# Patient Record
Sex: Male | Born: 1935
Health system: Southern US, Community
[De-identification: ages and names within clinical notes are randomized; demographics above are authoritative.]

## PROBLEM LIST (undated history)

## (undated) DIAGNOSIS — E559 Vitamin D deficiency, unspecified: Secondary | ICD-10-CM

## (undated) DIAGNOSIS — M1990 Unspecified osteoarthritis, unspecified site: Secondary | ICD-10-CM

## (undated) DIAGNOSIS — R55 Syncope and collapse: Secondary | ICD-10-CM

## (undated) DIAGNOSIS — S32591A Other specified fracture of right pubis, initial encounter for closed fracture: Secondary | ICD-10-CM

## (undated) DIAGNOSIS — R3915 Urgency of urination: Secondary | ICD-10-CM

## (undated) DIAGNOSIS — K5730 Diverticulosis of large intestine without perforation or abscess without bleeding: Secondary | ICD-10-CM

## (undated) DIAGNOSIS — G629 Polyneuropathy, unspecified: Secondary | ICD-10-CM

## (undated) DIAGNOSIS — N390 Urinary tract infection, site not specified: Secondary | ICD-10-CM

## (undated) DIAGNOSIS — J189 Pneumonia, unspecified organism: Secondary | ICD-10-CM

## (undated) DIAGNOSIS — M21372 Foot drop, left foot: Secondary | ICD-10-CM

## (undated) DIAGNOSIS — N400 Enlarged prostate without lower urinary tract symptoms: Secondary | ICD-10-CM

## (undated) DIAGNOSIS — R7989 Other specified abnormal findings of blood chemistry: Secondary | ICD-10-CM

## (undated) DIAGNOSIS — H3530 Unspecified macular degeneration: Secondary | ICD-10-CM

## (undated) DIAGNOSIS — T4145XA Adverse effect of unspecified anesthetic, initial encounter: Secondary | ICD-10-CM

## (undated) DIAGNOSIS — G252 Other specified forms of tremor: Secondary | ICD-10-CM

## (undated) DIAGNOSIS — Z79899 Other long term (current) drug therapy: Secondary | ICD-10-CM

## (undated) DIAGNOSIS — Z960 Presence of urogenital implants: Secondary | ICD-10-CM

## (undated) DIAGNOSIS — I10 Essential (primary) hypertension: Secondary | ICD-10-CM

## (undated) DIAGNOSIS — K269 Duodenal ulcer, unspecified as acute or chronic, without hemorrhage or perforation: Secondary | ICD-10-CM

## (undated) DIAGNOSIS — G459 Transient cerebral ischemic attack, unspecified: Secondary | ICD-10-CM

## (undated) DIAGNOSIS — R350 Frequency of micturition: Secondary | ICD-10-CM

## (undated) DIAGNOSIS — K4090 Unilateral inguinal hernia, without obstruction or gangrene, not specified as recurrent: Secondary | ICD-10-CM

## (undated) DIAGNOSIS — D126 Benign neoplasm of colon, unspecified: Secondary | ICD-10-CM

## (undated) DIAGNOSIS — M109 Gout, unspecified: Secondary | ICD-10-CM

## (undated) DIAGNOSIS — T8859XA Other complications of anesthesia, initial encounter: Secondary | ICD-10-CM

## (undated) DIAGNOSIS — N184 Chronic kidney disease, stage 4 (severe): Secondary | ICD-10-CM

## (undated) DIAGNOSIS — M21371 Foot drop, right foot: Secondary | ICD-10-CM

## (undated) DIAGNOSIS — R269 Unspecified abnormalities of gait and mobility: Secondary | ICD-10-CM

## (undated) DIAGNOSIS — G250 Essential tremor: Secondary | ICD-10-CM

## (undated) DIAGNOSIS — M48062 Spinal stenosis, lumbar region with neurogenic claudication: Secondary | ICD-10-CM

## (undated) DIAGNOSIS — E785 Hyperlipidemia, unspecified: Secondary | ICD-10-CM

## (undated) DIAGNOSIS — I739 Peripheral vascular disease, unspecified: Secondary | ICD-10-CM

## (undated) DIAGNOSIS — H02409 Unspecified ptosis of unspecified eyelid: Secondary | ICD-10-CM

## (undated) DIAGNOSIS — K648 Other hemorrhoids: Secondary | ICD-10-CM

## (undated) HISTORY — DX: Benign prostatic hyperplasia without lower urinary tract symptoms: N40.0

## (undated) HISTORY — DX: Unspecified abnormalities of gait and mobility: R26.9

## (undated) HISTORY — DX: Unilateral inguinal hernia, without obstruction or gangrene, not specified as recurrent: K40.90

## (undated) HISTORY — DX: Other long term (current) drug therapy: Z79.899

## (undated) HISTORY — DX: Unspecified osteoarthritis, unspecified site: M19.90

## (undated) HISTORY — PX: OTHER SURGICAL HISTORY: SHX169

## (undated) HISTORY — DX: Gout, unspecified: M10.9

## (undated) HISTORY — DX: Duodenal ulcer, unspecified as acute or chronic, without hemorrhage or perforation: K26.9

## (undated) HISTORY — DX: Essential tremor: G25.0

## (undated) HISTORY — DX: Benign neoplasm of colon, unspecified: D12.6

## (undated) HISTORY — DX: Chronic kidney disease, stage 4 (severe): N18.4

## (undated) HISTORY — DX: Diverticulosis of large intestine without perforation or abscess without bleeding: K57.30

## (undated) HISTORY — DX: Vitamin D deficiency, unspecified: E55.9

## (undated) HISTORY — DX: Syncope and collapse: R55

## (undated) HISTORY — DX: Spinal stenosis, lumbar region with neurogenic claudication: M48.062

## (undated) HISTORY — DX: Other specified abnormal findings of blood chemistry: R79.89

## (undated) HISTORY — PX: FINGER SURGERY: SHX640

## (undated) HISTORY — PX: NERVE BIOPSY: SHX1015

## (undated) HISTORY — DX: Peripheral vascular disease, unspecified: I73.9

## (undated) HISTORY — DX: Essential tremor: G25.2

## (undated) HISTORY — DX: Other hemorrhoids: K64.8

## (undated) HISTORY — DX: Unspecified ptosis of unspecified eyelid: H02.409

## (undated) HISTORY — DX: Other specified fracture of right pubis, initial encounter for closed fracture: S32.591A

## (undated) HISTORY — PX: HERNIA REPAIR: SHX51

---

## 1998-02-10 ENCOUNTER — Ambulatory Visit (HOSPITAL_COMMUNITY): Admission: RE | Admit: 1998-02-10 | Discharge: 1998-02-10 | Payer: Self-pay | Admitting: Oncology

## 2003-09-29 ENCOUNTER — Ambulatory Visit (HOSPITAL_COMMUNITY): Admission: RE | Admit: 2003-09-29 | Discharge: 2003-09-29 | Payer: Self-pay | Admitting: Internal Medicine

## 2005-07-30 ENCOUNTER — Encounter: Admission: RE | Admit: 2005-07-30 | Discharge: 2005-07-30 | Payer: Self-pay | Admitting: Neurology

## 2005-08-13 ENCOUNTER — Encounter: Admission: RE | Admit: 2005-08-13 | Discharge: 2005-08-13 | Payer: Self-pay | Admitting: Neurology

## 2006-07-08 ENCOUNTER — Ambulatory Visit (HOSPITAL_COMMUNITY): Admission: EM | Admit: 2006-07-08 | Discharge: 2006-07-08 | Payer: Self-pay | Admitting: Emergency Medicine

## 2007-10-23 ENCOUNTER — Encounter: Admission: RE | Admit: 2007-10-23 | Discharge: 2007-10-23 | Payer: Self-pay | Admitting: Neurology

## 2008-10-07 ENCOUNTER — Encounter: Admission: RE | Admit: 2008-10-07 | Discharge: 2008-10-07 | Payer: Self-pay | Admitting: Internal Medicine

## 2009-07-24 ENCOUNTER — Emergency Department (HOSPITAL_BASED_OUTPATIENT_CLINIC_OR_DEPARTMENT_OTHER): Admission: EM | Admit: 2009-07-24 | Discharge: 2009-07-24 | Payer: Self-pay | Admitting: Emergency Medicine

## 2009-07-24 ENCOUNTER — Ambulatory Visit: Payer: Self-pay | Admitting: Interventional Radiology

## 2009-10-01 HISTORY — PX: OTHER SURGICAL HISTORY: SHX169

## 2010-07-10 ENCOUNTER — Ambulatory Visit (HOSPITAL_COMMUNITY): Admission: AD | Admit: 2010-07-10 | Discharge: 2010-07-12 | Payer: Self-pay | Admitting: Orthopedic Surgery

## 2010-07-27 ENCOUNTER — Ambulatory Visit (HOSPITAL_COMMUNITY)
Admission: RE | Admit: 2010-07-27 | Discharge: 2010-07-27 | Payer: Self-pay | Source: Home / Self Care | Admitting: Gastroenterology

## 2010-10-21 ENCOUNTER — Encounter: Payer: Self-pay | Admitting: Neurology

## 2010-12-14 LAB — BASIC METABOLIC PANEL
BUN: 25 mg/dL — ABNORMAL HIGH (ref 6–23)
CO2: 26 mEq/L (ref 19–32)
Calcium: 8.7 mg/dL (ref 8.4–10.5)
Chloride: 107 mEq/L (ref 96–112)
Creatinine, Ser: 1.61 mg/dL — ABNORMAL HIGH (ref 0.4–1.5)
GFR calc Af Amer: 50 mL/min — ABNORMAL LOW (ref >60)
GFR calc non Af Amer: 42 mL/min — ABNORMAL LOW (ref >60)
Glucose, Bld: 117 mg/dL — ABNORMAL HIGH (ref 70–99)
Potassium: 4.8 mEq/L (ref 3.5–5.1)
Sodium: 140 mEq/L (ref 135–145)

## 2010-12-14 LAB — DIFFERENTIAL
Basophils Absolute: 0 10*3/uL (ref 0.0–0.1)
Basophils Relative: 0 % (ref 0–1)
Eosinophils Absolute: 0.1 10*3/uL (ref 0.0–0.7)
Eosinophils Relative: 1 % (ref 0–5)
Lymphocytes Relative: 18 % (ref 12–46)
Lymphs Abs: 1.9 10*3/uL (ref 0.7–4.0)
Monocytes Absolute: 0.9 10*3/uL (ref 0.1–1.0)
Monocytes Relative: 8 % (ref 3–12)
Neutro Abs: 7.5 10*3/uL (ref 1.7–7.7)
Neutrophils Relative %: 72 % (ref 43–77)

## 2010-12-14 LAB — COMPREHENSIVE METABOLIC PANEL
ALT: 18 U/L (ref 0–53)
AST: 23 U/L (ref 0–37)
Albumin: 3.4 g/dL — ABNORMAL LOW (ref 3.5–5.2)
Alkaline Phosphatase: 55 U/L (ref 39–117)
BUN: 26 mg/dL — ABNORMAL HIGH (ref 6–23)
CO2: 26 mEq/L (ref 19–32)
Calcium: 9.4 mg/dL (ref 8.4–10.5)
Chloride: 104 mEq/L (ref 96–112)
Creatinine, Ser: 1.79 mg/dL — ABNORMAL HIGH (ref 0.4–1.5)
GFR calc Af Amer: 45 mL/min — ABNORMAL LOW (ref >60)
GFR calc non Af Amer: 37 mL/min — ABNORMAL LOW (ref >60)
Glucose, Bld: 102 mg/dL — ABNORMAL HIGH (ref 70–99)
Potassium: 4.5 mEq/L (ref 3.5–5.1)
Sodium: 138 mEq/L (ref 135–145)
Total Bilirubin: 0.7 mg/dL (ref 0.3–1.2)
Total Protein: 6.7 g/dL (ref 6.0–8.3)

## 2010-12-14 LAB — CBC
HCT: 32.4 % — ABNORMAL LOW (ref 39.0–52.0)
HCT: 37.1 % — ABNORMAL LOW (ref 39.0–52.0)
Hemoglobin: 10.3 g/dL — ABNORMAL LOW (ref 13.0–17.0)
Hemoglobin: 12 g/dL — ABNORMAL LOW (ref 13.0–17.0)
MCH: 32.2 pg (ref 26.0–34.0)
MCH: 32.9 pg (ref 26.0–34.0)
MCHC: 31.8 g/dL (ref 30.0–36.0)
MCHC: 32.3 g/dL (ref 30.0–36.0)
MCV: 101.3 fL — ABNORMAL HIGH (ref 78.0–100.0)
MCV: 101.6 fL — ABNORMAL HIGH (ref 78.0–100.0)
Platelets: 157 10*3/uL (ref 150–400)
Platelets: 161 10*3/uL (ref 150–400)
RBC: 3.2 MIL/uL — ABNORMAL LOW (ref 4.22–5.81)
RBC: 3.65 MIL/uL — ABNORMAL LOW (ref 4.22–5.81)
RDW: 13.7 % (ref 11.5–15.5)
RDW: 14 % (ref 11.5–15.5)
WBC: 10.4 10*3/uL (ref 4.0–10.5)
WBC: 8.6 10*3/uL (ref 4.0–10.5)

## 2010-12-14 LAB — APTT: aPTT: 28 seconds (ref 24–37)

## 2010-12-14 LAB — PROTIME-INR
INR: 1.05 (ref 0.00–1.49)
Prothrombin Time: 13.9 seconds (ref 11.6–15.2)

## 2010-12-14 LAB — SURGICAL PCR SCREEN
MRSA, PCR: NEGATIVE
Staphylococcus aureus: NEGATIVE

## 2011-02-16 NOTE — Op Note (Signed)
[name], [name]                 ACCOUNT NO.:  [id number]   MEDICAL RECORD NO.:  [id number]          PATIENT TYPE:  OBV   LOCATION:  2550                         FACILITY:  [facility]   PHYSICIAN:  Melrose Nakayama, MD  DATE OF BIRTH:  12/04/1928   DATE OF PROCEDURE:  07/08/2006  DATE OF DISCHARGE:                                 OPERATIVE REPORT   PREOPERATIVE DIAGNOSIS:  Left ring finger table-saw injury, distal tip  amputation.   POSTOPERATIVE DIAGNOSIS:  Left ring finger table-saw injury, distal tip  amputation.   PROCEDURES:  1. Left ring finger V-Y advancement flap.  2. Left ring finger debridement of skin, subcutaneous tissue and bone      associated with open fracture.  3. Left ring finger nail bed ablation.   ATTENDING SURGEON:  Melrose Nakayama, MD, who was scrubbed and present for  the entire procedure.   ASSISTANT SURGEON:  None.   ANESTHESIA:  General via endotracheal tube.   SURGICAL INDICATIONS:  Drew Gibson is a 77 year old, right-hand-dominant  gentleman, who was at home working on his table saw, when he sustained a  traumatic injury to his left ring finger.  The patient was seen in the  emergency department and noted to have an exposed distal phalanx, as well as  a transverse amputation through the distal phalanx.  The patient was seen  and evaluated.  He received tetanus and a Betadine dressing in the emergency  department.  A signed informed consent was obtained to proceed with the  above procedure.  The risks, benefits and alternatives were discussed in  detail with the patient and signed informed consent was obtained.   INTRAOPERATIVE FINDINGS:  The patient did have a transverse amputation of  the distal phalanx.  The patient had a small amount of the germinal and  sterile matrix left, with little distal phalanx to support the nail.  He had  very little volar skin loss.  It was mainly dorsally.   DESCRIPTION OF PROCEDURE:  The patient was properly  identified in the  preoperative holding area and a mark with a permanent marker was made on the  left ring finger to indicate the correct operative site.  The patient  received 1 g IV Ancef prior to any skin incisions.  The patient was then  brought back to the operating room and placed supine on the anesthesia  operating table, where general anesthesia was administered via endotracheal  tube.  The patient tolerated this procedure well.  A well-padded tourniquet  was then placed on the left brachium and sealed with a sterile drape.  The  left upper extremity was then prepped with Betadine and then sterilely  draped.  The left upper extremity was then prepped with Betadine and then  sterilely draped.  The limb was then elevated, and using Esmarch  exsanguination, the tourniquet insufflated to 250 mmHg.  Attention was  turned to the left ring finger, where debridement of the skin, subcutaneous  tissue and bone was then carried out.  A portion of the distal phalanx was  removed  with a rongeur, and the bone was shortened.  There was very little  supporting bone of the distal phalanx to support a nail; therefore, a nail  bed ablation was then carried out sharply using a 15 blade.  Two back cuts  were made in the eponychium in order to ablate the nail matrix.  Following  this, the patient still had a soft tissue coverage issue to cover the distal  phalanx.  Therefore, a V-Y advancement flap was then shaped out volarly.  The skin was then incised and the subcutaneous tissue was then bluntly  dissected and the skin was then mobilized distally to make a volar-based  flap, which swung over dorsally.  This obtained good closure over the distal  phalanx.  The excess skin was then trimmed using a 15 blade, and the skin  flaps were then sewn in with a 5-0 chromic suture.  After the soft tissue  coverage was complete, the tourniquet was deflated after being up for 30  minutes.  There was good perfusion of  the distal tip, as well as the skin  flap.  Xeroform was then applied to the finger.  Marcaine 1% 5 cc was then  injected into the finger, into the flexor tendon sheath for a local digital  block, and then the 5 cc remaining was injected volarly around the digital  nerves.  A sterile compressive dressing was then applied.  The patient was  then placed in a well-padded finger splint.  The patient was then extubated  and taken to the recovery room in good condition.   POSTOPERATIVE PLAN:  The patient will be continued on p.o. antibiotics and  pain medicines.  He will come back to the office in approximately 7 days for  a wound check.  At that time, we will hopefully get him down into therapy to  get him a tip-protector splint and allow him to work on motion.  And then we  will see him back and allow for soft tissue healing.      Melrose Nakayama, MD  Electronically Signed     FWO/MEDQ  D:  07/08/2006  T:  07/09/2006  Job:  [id number]

## 2011-08-04 ENCOUNTER — Emergency Department (INDEPENDENT_AMBULATORY_CARE_PROVIDER_SITE_OTHER): Payer: Medicare Other

## 2011-08-04 ENCOUNTER — Emergency Department (HOSPITAL_BASED_OUTPATIENT_CLINIC_OR_DEPARTMENT_OTHER)
Admission: EM | Admit: 2011-08-04 | Discharge: 2011-08-04 | Disposition: A | Discharge status: Other Acute Inpatient Hospital | Payer: Medicare Other | Attending: Emergency Medicine | Admitting: Emergency Medicine

## 2011-08-04 ENCOUNTER — Encounter: Payer: Self-pay | Admitting: *Deleted

## 2011-08-04 ENCOUNTER — Inpatient Hospital Stay (HOSPITAL_COMMUNITY)
Admission: AD | Admit: 2011-08-04 | Discharge: 2011-08-07 | DRG: 194 | Disposition: A | Discharge status: Home / Self Care | Payer: Medicare Other | Source: Other Acute Inpatient Hospital | Attending: Internal Medicine | Admitting: Internal Medicine

## 2011-08-04 DIAGNOSIS — D649 Anemia, unspecified: Secondary | ICD-10-CM | POA: Diagnosis present

## 2011-08-04 DIAGNOSIS — G629 Polyneuropathy, unspecified: Secondary | ICD-10-CM | POA: Diagnosis present

## 2011-08-04 DIAGNOSIS — N179 Acute kidney failure, unspecified: Secondary | ICD-10-CM | POA: Diagnosis present

## 2011-08-04 DIAGNOSIS — R509 Fever, unspecified: Secondary | ICD-10-CM

## 2011-08-04 DIAGNOSIS — I959 Hypotension, unspecified: Secondary | ICD-10-CM | POA: Diagnosis present

## 2011-08-04 DIAGNOSIS — R0902 Hypoxemia: Secondary | ICD-10-CM | POA: Diagnosis present

## 2011-08-04 DIAGNOSIS — G589 Mononeuropathy, unspecified: Secondary | ICD-10-CM | POA: Diagnosis present

## 2011-08-04 DIAGNOSIS — J984 Other disorders of lung: Secondary | ICD-10-CM

## 2011-08-04 DIAGNOSIS — N401 Enlarged prostate with lower urinary tract symptoms: Secondary | ICD-10-CM | POA: Diagnosis present

## 2011-08-04 DIAGNOSIS — E860 Dehydration: Secondary | ICD-10-CM | POA: Diagnosis present

## 2011-08-04 DIAGNOSIS — D539 Nutritional anemia, unspecified: Secondary | ICD-10-CM

## 2011-08-04 DIAGNOSIS — N400 Enlarged prostate without lower urinary tract symptoms: Secondary | ICD-10-CM | POA: Diagnosis present

## 2011-08-04 DIAGNOSIS — R059 Cough, unspecified: Secondary | ICD-10-CM

## 2011-08-04 DIAGNOSIS — I129 Hypertensive chronic kidney disease with stage 1 through stage 4 chronic kidney disease, or unspecified chronic kidney disease: Secondary | ICD-10-CM | POA: Diagnosis present

## 2011-08-04 DIAGNOSIS — R3911 Hesitancy of micturition: Secondary | ICD-10-CM | POA: Diagnosis present

## 2011-08-04 DIAGNOSIS — R05 Cough: Secondary | ICD-10-CM

## 2011-08-04 DIAGNOSIS — N183 Chronic kidney disease, stage 3 unspecified: Secondary | ICD-10-CM | POA: Diagnosis present

## 2011-08-04 DIAGNOSIS — A419 Sepsis, unspecified organism: Secondary | ICD-10-CM

## 2011-08-04 DIAGNOSIS — R0602 Shortness of breath: Secondary | ICD-10-CM | POA: Insufficient documentation

## 2011-08-04 DIAGNOSIS — M109 Gout, unspecified: Secondary | ICD-10-CM | POA: Diagnosis present

## 2011-08-04 DIAGNOSIS — N138 Other obstructive and reflux uropathy: Secondary | ICD-10-CM | POA: Diagnosis present

## 2011-08-04 DIAGNOSIS — J189 Pneumonia, unspecified organism: Secondary | ICD-10-CM

## 2011-08-04 HISTORY — DX: Polyneuropathy, unspecified: G62.9

## 2011-08-04 HISTORY — DX: Essential (primary) hypertension: I10

## 2011-08-04 HISTORY — DX: Benign prostatic hyperplasia without lower urinary tract symptoms: N40.0

## 2011-08-04 LAB — URINALYSIS, ROUTINE W REFLEX MICROSCOPIC
Bilirubin Urine: NEGATIVE
Glucose, UA: NEGATIVE mg/dL
Ketones, ur: NEGATIVE mg/dL
Protein, ur: NEGATIVE mg/dL
Urobilinogen, UA: 0.2 mg/dL (ref 0.0–1.0)

## 2011-08-04 LAB — BASIC METABOLIC PANEL
BUN: 48 mg/dL — ABNORMAL HIGH (ref 6–23)
Chloride: 100 mEq/L (ref 96–112)
Creatinine, Ser: 1.9 mg/dL — ABNORMAL HIGH (ref 0.50–1.35)
GFR calc Af Amer: 37 mL/min — ABNORMAL LOW (ref >90)
GFR calc non Af Amer: 32 mL/min — ABNORMAL LOW (ref >90)
Glucose, Bld: 113 mg/dL — ABNORMAL HIGH (ref 70–99)

## 2011-08-04 LAB — DIFFERENTIAL
Basophils Relative: 0 % (ref 0–1)
Eosinophils Absolute: 0.1 10*3/uL (ref 0.0–0.7)
Lymphs Abs: 0.7 10*3/uL (ref 0.7–4.0)
Monocytes Absolute: 0.3 10*3/uL (ref 0.1–1.0)
Monocytes Relative: 4 % (ref 3–12)

## 2011-08-04 LAB — URINE MICROSCOPIC-ADD ON

## 2011-08-04 LAB — CBC
HCT: 39.4 % (ref 39.0–52.0)
Hemoglobin: 12.9 g/dL — ABNORMAL LOW (ref 13.0–17.0)
MCH: 32 pg (ref 26.0–34.0)
MCHC: 32.7 g/dL (ref 30.0–36.0)
MCV: 97.8 fL (ref 78.0–100.0)
RBC: 4.03 MIL/uL — ABNORMAL LOW (ref 4.22–5.81)

## 2011-08-04 LAB — PROCALCITONIN: Procalcitonin: <0.1 ng/mL

## 2011-08-04 LAB — PROTIME-INR: INR: 1.04 (ref 0.00–1.49)

## 2011-08-04 MED ORDER — ACETAMINOPHEN 500 MG PO TABS
1000.0000 mg | ORAL_TABLET | Freq: Once | ORAL | Status: AC
  Administered 2011-08-04: 1000 mg via ORAL
  Filled 2011-08-04: qty 2

## 2011-08-04 MED ORDER — ZOLPIDEM TARTRATE 5 MG PO TABS: 5.0000 mg | ORAL_TABLET | Freq: Every evening | ORAL | Status: DC | PRN

## 2011-08-04 MED ORDER — ALBUTEROL SULFATE (5 MG/ML) 0.5% IN NEBU
2.5000 mg | INHALATION_SOLUTION | RESPIRATORY_TRACT | Status: DC | PRN
  Filled 2011-08-04: qty 20

## 2011-08-04 MED ORDER — ONDANSETRON HCL 4 MG PO TABS: 4.0000 mg | ORAL_TABLET | Freq: Four times a day (QID) | ORAL | Status: DC | PRN

## 2011-08-04 MED ORDER — DEXTROSE 5 % IV SOLN
1.0000 g | INTRAVENOUS | Status: DC
  Administered 2011-08-05 – 2011-08-06 (×2): 1 g via INTRAVENOUS
  Filled 2011-08-04 (×3): qty 10

## 2011-08-04 MED ORDER — SENNA 8.6 MG PO TABS: 2.0000 | ORAL_TABLET | Freq: Every day | ORAL | Status: DC | PRN

## 2011-08-04 MED ORDER — B COMPLEX-C PO TABS
1.0000 | ORAL_TABLET | Freq: Every day | ORAL | Status: DC
  Administered 2011-08-05 – 2011-08-07 (×3): 1 via ORAL
  Filled 2011-08-04 (×3): qty 1

## 2011-08-04 MED ORDER — DEXTROSE 5 % IV SOLN
1.0000 g | Freq: Once | INTRAVENOUS | Status: AC
  Administered 2011-08-04: 1 g via INTRAVENOUS
  Filled 2011-08-04: qty 10

## 2011-08-04 MED ORDER — ONDANSETRON HCL 4 MG/2ML IJ SOLN: 4.0000 mg | Freq: Four times a day (QID) | INTRAMUSCULAR | Status: DC | PRN

## 2011-08-04 MED ORDER — SODIUM CHLORIDE 0.9 % IV BOLUS (SEPSIS)
1000.0000 mL | Freq: Once | INTRAVENOUS | Status: AC
  Administered 2011-08-04 (×2): 1000 mL via INTRAVENOUS

## 2011-08-04 MED ORDER — DOCUSATE SODIUM 100 MG PO CAPS
100.0000 mg | ORAL_CAPSULE | Freq: Two times a day (BID) | ORAL | Status: DC
  Administered 2011-08-05 – 2011-08-07 (×5): 100 mg via ORAL
  Filled 2011-08-04 (×7): qty 1

## 2011-08-04 MED ORDER — ALBUTEROL SULFATE (5 MG/ML) 0.5% IN NEBU
2.5000 mg | INHALATION_SOLUTION | Freq: Four times a day (QID) | RESPIRATORY_TRACT | Status: DC
  Administered 2011-08-05 – 2011-08-06 (×7): 2.5 mg via RESPIRATORY_TRACT
  Filled 2011-08-04 (×14): qty 20

## 2011-08-04 MED ORDER — TAMSULOSIN HCL 0.4 MG PO CAPS
0.4000 mg | ORAL_CAPSULE | Freq: Every day | ORAL | Status: DC
  Administered 2011-08-05 – 2011-08-07 (×3): 0.4 mg via ORAL
  Filled 2011-08-04 (×3): qty 1

## 2011-08-04 MED ORDER — GUAIFENESIN ER 600 MG PO TB12
600.0000 mg | ORAL_TABLET | Freq: Two times a day (BID) | ORAL | Status: DC
  Administered 2011-08-05 – 2011-08-07 (×5): 600 mg via ORAL
  Filled 2011-08-04 (×7): qty 1

## 2011-08-04 MED ORDER — SODIUM CHLORIDE 0.9 % IV BOLUS (SEPSIS)
1000.0000 mL | Freq: Once | INTRAVENOUS | Status: AC
  Administered 2011-08-04: 1000 mL via INTRAVENOUS

## 2011-08-04 MED ORDER — SODIUM CHLORIDE 0.9 % IV BOLUS (SEPSIS): 1000.0000 mL | Freq: Once | INTRAVENOUS | Status: DC

## 2011-08-04 MED ORDER — DEXTROSE 5 % IV SOLN
500.0000 mg | Freq: Once | INTRAVENOUS | Status: AC
  Administered 2011-08-04: 500 mg via INTRAVENOUS
  Filled 2011-08-04: qty 500

## 2011-08-04 MED ORDER — ASPIRIN EC 81 MG PO TBEC
81.0000 mg | DELAYED_RELEASE_TABLET | Freq: Every day | ORAL | Status: DC
  Administered 2011-08-05 – 2011-08-07 (×3): 81 mg via ORAL
  Filled 2011-08-04 (×3): qty 1

## 2011-08-04 MED ORDER — ENOXAPARIN SODIUM 40 MG/0.4ML ~~LOC~~ SOLN
40.0000 mg | SUBCUTANEOUS | Status: DC
  Administered 2011-08-05 – 2011-08-06 (×2): 40 mg via SUBCUTANEOUS
  Filled 2011-08-04 (×3): qty 0.4

## 2011-08-04 MED ORDER — SODIUM CHLORIDE 0.9 % IV SOLN
INTRAVENOUS | Status: DC
  Administered 2011-08-05: 03:00:00 via INTRAVENOUS

## 2011-08-04 MED ORDER — VITAMIN D3 25 MCG (1000 UNIT) PO TABS
2000.0000 [IU] | ORAL_TABLET | ORAL | Status: DC
  Filled 2011-08-04: qty 2

## 2011-08-04 MED ORDER — ACETAMINOPHEN 500 MG PO TABS
1000.0000 mg | ORAL_TABLET | Freq: Four times a day (QID) | ORAL | Status: DC | PRN
  Administered 2011-08-05: 1000 mg via ORAL

## 2011-08-04 MED ORDER — FLORA-Q PO CAPS
1.0000 | ORAL_CAPSULE | Freq: Every day | ORAL | Status: DC
  Administered 2011-08-05 – 2011-08-07 (×3): 1 via ORAL
  Filled 2011-08-04 (×4): qty 1

## 2011-08-04 MED ORDER — DEXTROSE 5 % IV SOLN
INTRAVENOUS | Status: AC
  Administered 2011-08-04: 250 mL
  Filled 2011-08-04: qty 250

## 2011-08-04 MED ORDER — THERA M PLUS PO TABS
1.0000 | ORAL_TABLET | Freq: Every day | ORAL | Status: DC
  Administered 2011-08-05 – 2011-08-06 (×2): 1 via ORAL
  Administered 2011-08-07: 09:00:00 via ORAL
  Filled 2011-08-04 (×3): qty 1

## 2011-08-04 MED ORDER — VITAMIN D3 25 MCG (1000 UNIT) PO TABS
2000.0000 [IU] | ORAL_TABLET | ORAL | Status: DC
  Administered 2011-08-05 – 2011-08-07 (×2): 2000 [IU] via ORAL
  Filled 2011-08-04 (×3): qty 2

## 2011-08-04 MED ORDER — DEXTROSE 5 % IV SOLN
500.0000 mg | INTRAVENOUS | Status: DC
  Administered 2011-08-05 – 2011-08-06 (×2): 500 mg via INTRAVENOUS
  Filled 2011-08-04 (×3): qty 500

## 2011-08-04 MED ORDER — ALUM & MAG HYDROXIDE-SIMETH 200-200-20 MG/5ML PO SUSP: 30.0000 mL | Freq: Four times a day (QID) | ORAL | Status: DC | PRN

## 2011-08-04 MED ORDER — ALLOPURINOL 100 MG PO TABS
100.0000 mg | ORAL_TABLET | Freq: Every day | ORAL | Status: DC
  Administered 2011-08-05 – 2011-08-07 (×3): 100 mg via ORAL
  Filled 2011-08-04 (×3): qty 1

## 2011-08-04 NOTE — ED Provider Notes (Signed)
History     CSN: [id number] Arrival date & time: 08/04/2011  7:36 AM   First MD Initiated Contact with Patient 08/04/11 0745      Chief Complaint  Patient presents with  . Fever    HPI The patient is an 77 year old male who began with cough 4 days ago on Wednesday. Patient saw his primary care physician, Dr. Jeanmarie Hubert, on Thursday. At that time he had clear lung exam and a negative chest x-ray. He was started on amoxicillin and has taken 3 doses of this. Patient had been gradually worsening and this morning awoke with shaking chills. He also is able to walk normally just grabbing on to things occasionally to maintain his balance. This morning he was unable to do so. He arrived by EMS with his wife. They normally live independently. Patient's past medical history is significant only for hypertension, neuropathy, gout, and benign prostatic hypertrophy. Patient was tachycardic to the 130s, hypoxic to 84% on room air, and normotensive. He was also febrile to 101.2. Patient had not had any Tylenol this morning. He denied any chest pain and dorsally cough and lightheadedness. Patient does not have any history of heart failure or other heart conditions. He did appear to be in sinus tachycardia on the monitor. Patient has had generalized myalgias as well as congestion.  The family also notes that the patient was unable to urinate this morning. They were uncertain if this could be related to a urinary tract infection as well as the patient has a history of these or if this might also be related to him taking some antihistamine for his symptoms. Patient says his discomfort is a 8/10. There are no other associated or modifying factors. Past Medical History  Diagnosis Date  . Gout   . Hypertension   . Peripheral neuropathy   . Enlarged prostate     Past Surgical History  Procedure Date  . Hernia repair     No family history on file.  History  Substance Use Topics  . Smoking status: Not on file    . Smokeless tobacco: Not on file  . Alcohol Use: No      Review of Systems  Constitutional: Positive for chills, activity change, appetite change and fatigue.  HENT: Positive for congestion.   Eyes: Negative.   Respiratory: Positive for cough and shortness of breath.   Cardiovascular: Negative.  Negative for chest pain.  Gastrointestinal: Negative.  Negative for nausea, vomiting, diarrhea and constipation.  Genitourinary: Positive for decreased urine volume and difficulty urinating.  Musculoskeletal: Positive for myalgias.  Neurological: Positive for light-headedness.  Hematological: Negative.   Psychiatric/Behavioral: Negative.   All other systems reviewed and are negative.    Allergies  Review of patient's allergies indicates no known allergies.  Home Medications   Current Outpatient Rx  Name Route Sig Dispense Refill  . ALLOPURINOL 100 MG PO TABS Oral Take 100 mg by mouth daily.      . AMOXICILLIN 500 MG PO CAPS Oral Take 500 mg by mouth 2 (two) times daily.      Marland Kitchen HYDROCOD POLST-CHLORPHEN POLST 10-8 MG/5ML PO LQCR Oral Take 5 mLs by mouth.      . ENALAPRIL MALEATE 5 MG PO TABS Oral Take 5 mg by mouth daily.      . GUAIFENESIN-DM 100-10 MG/5ML PO SYRP Oral Take 5 mLs by mouth 3 (three) times daily as needed.      . OSELTAMIVIR PHOSPHATE 30 MG PO CAPS Oral Take 30  mg by mouth 2 (two) times daily.      Marland Kitchen TAMSULOSIN HCL 0.4 MG PO CAPS Oral Take 0.4 mg by mouth daily.        BP 136/68  Pulse 131  Temp(Src) 101.2 F (38.4 C) (Oral)  Resp 22  SpO2 94%  Physical Exam  Nursing note and vitals reviewed. Constitutional: He is oriented to person, place, and time. He appears well-developed and well-nourished. No distress.  HENT:  Head: Normocephalic and atraumatic.  Right Ear: External ear normal.  Left Ear: External ear normal.  Nose: Nose normal.  Eyes: Conjunctivae and EOM are normal. Pupils are equal, round, and reactive to light.  Neck: Normal range of motion.   Cardiovascular: Regular rhythm and normal heart sounds.  Tachycardia present.  Exam reveals no gallop and no friction rub.   No murmur heard. Pulmonary/Chest: No accessory muscle usage. Tachypnea noted. No respiratory distress. He has no decreased breath sounds. He has no wheezes. He has no rhonchi. He has no rales.  Abdominal: Soft. Bowel sounds are normal. He exhibits no distension. There is no tenderness. There is no rebound and no guarding.  Musculoskeletal: Normal range of motion. He exhibits no edema and no tenderness.  Neurological: He is alert and oriented to person, place, and time. No cranial nerve deficit. He exhibits normal muscle tone. Coordination normal.  Skin: Skin is warm and dry. No rash noted.  Psychiatric: He has a normal mood and affect.    ED Course  Procedures (including critical care time)  Labs Reviewed  CBC - Abnormal; Notable for the following:    RBC 4.03 (*)    Hemoglobin 12.9 (*)    Platelets 125 (*)    All other components within normal limits  DIFFERENTIAL - Abnormal; Notable for the following:    Neutrophils Relative 86 (*)    Lymphocytes Relative 8 (*)    All other components within normal limits  BASIC METABOLIC PANEL - Abnormal; Notable for the following:    Glucose, Bld 113 (*)    BUN 48 (*)    Creatinine, Ser 1.90 (*)    GFR calc non Af Amer 32 (*)    GFR calc Af Amer 37 (*)    All other components within normal limits  PRO B NATRIURETIC PEPTIDE - Abnormal; Notable for the following:    BNP, POC 760.8 (*)    All other components within normal limits  LACTIC ACID, PLASMA  PROTIME-INR  TROPONIN I  PROCALCITONIN  CULTURE, BLOOD (ROUTINE X 2)  CULTURE, BLOOD (ROUTINE X 2)  URINE CULTURE  URINALYSIS, ROUTINE W REFLEX MICROSCOPIC   Dg Chest Portable 1 View  08/04/2011  *RADIOLOGY REPORT*  Clinical Data: Fever, cough  PORTABLE CHEST - 1 VIEW  Comparison: 07/10/2010  Findings: Decreased lung volumes with mild vascular congestion. Left midlung  faint peripheral airspace opacity suspicious for underlying pneumonia.  Minimal basilar atelectasis.  No current CHF, effusion or pneumothorax.  Atherosclerosis of the aorta.  IMPRESSION: Left midlung airspace disease/pneumonia.  Decreased lung volumes and vascular congestion  Basilar atelectasis  Original Report Authenticated By: Jerilynn Mages. Daryll Brod, M.D.     1. Community acquired pneumonia   2. Dehydration   3. Fever   4. Sepsis      Date: 08/04/2011  Rate: 101  Rhythm: normal sinus rhythm  QRS Axis: normal  Intervals: normal  ST/T Wave abnormalities: normal  Conduction Disutrbances:none  Narrative Interpretation: No significant changes compared to previous  Old EKG Reviewed: Unchanged aside  from sinus tachycardia   CRITICAL CARE Performed by: Chauncy Passy   Total critical care time: 30 minutes  Critical care time was exclusive of separately billable procedures and treating other patients.  Critical care was necessary to treat or prevent imminent or life-threatening deterioration.  Critical care was time spent personally by me on the following activities: development of treatment plan with patient and/or surrogate as well as nursing, discussions with consultants, evaluation of patient's response to treatment, examination of patient, obtaining history from patient or surrogate, ordering and performing treatments and interventions, ordering and review of laboratory studies, ordering and review of radiographic studies, pulse oximetry and re-evaluation of patient's condition.  MDM  The patient was seen by myself upon arrival. He was placed on 4 L of O2 per nasal cannula and was able to maintain his oxygen saturation 93-94%. Given patient's fever, tachycardia, and tachypnea, and hypoxia he was treated as sepsis with likely pulmonary source. Patient also had urinary retention as well. He had not had any recent hospitalizations that would put him at risk for healthcare associated pneumonia.  Either with a urinary tract infection or with pneumonia the patient would require Rocephin as an appropriate initial antibiotic. Patient had blood work ordered including a sepsis workup with cultures as well as lactate pro calcitonin. Chest x-ray and EKG were ordered along with a set of cardiac enzymes and be in PE. Patient had no history of heart failure in his presentation was not consistent with this. Patient was written for a liter of normal saline IV bolus as well as Rocephin and Tylenol. Nursing staff will be instructed to begin a second IV on the patient. Results are pending at this time. Patient will be reassessed.   8:26 AM EKG remarkable only for sinus tachycardia  8:43 AM patient continues to sat 94% on 4 L per nasal cannula. Tachycardia decreased to the 100s. Patient feeling better and clinically improved. Chest x-ray showed left midlung pneumonia. Antibiotics are infusing and first liter of normal saline is almost complete. Tylenol has been given. Patient had no leukocytosis but did have a neutrophilic predominance on his differential at 84%. Patient had elevated BUN/creatinine ratio with creatinine of 1.9 today with baseline of 1.6. Lactate was only 1.4.   9:30 AM after 2 L patient's tachycardia has improved however he continues to require supplemental oxygen and his blood pressure is now decreased from 130s over 60s to 90s over 50s. Patient urinalysis is still pending. Azithromycin 500 mg has been ordered. Patient will be put on third liter on pump at 150 cc an hour. I spoke with family and patient will be transferred to one of the [facility] inpatient facilities for further management.  9:42 AM the patient was accepted for transfer to [facility] long hospital.  Staff is still obtaining urine at this time.  Chauncy Passy, MD 08/04/11 [phone]

## 2011-08-04 NOTE — ED Notes (Signed)
NS rate changed from 150cc/hr to bolus per MD due to BP lower than baseline

## 2011-08-04 NOTE — ED Notes (Signed)
Patient states that he started feeling bad last Wed morning temp 99.2, went to [facility] on Thur for cough/fever, placed on amoxicillin at that time, took cough meds with antihistamine/codeine, woke up this morning with fever/cough/ aches/weakness. Difficult to sleep last night. Per wife  congestion has moved into his chest. No v/d, able to eat and drink

## 2011-08-05 DIAGNOSIS — I959 Hypotension, unspecified: Secondary | ICD-10-CM | POA: Diagnosis present

## 2011-08-05 DIAGNOSIS — N400 Enlarged prostate without lower urinary tract symptoms: Secondary | ICD-10-CM | POA: Diagnosis present

## 2011-08-05 DIAGNOSIS — G629 Polyneuropathy, unspecified: Secondary | ICD-10-CM | POA: Diagnosis present

## 2011-08-05 DIAGNOSIS — R0902 Hypoxemia: Secondary | ICD-10-CM | POA: Diagnosis present

## 2011-08-05 DIAGNOSIS — J189 Pneumonia, unspecified organism: Secondary | ICD-10-CM | POA: Diagnosis present

## 2011-08-05 DIAGNOSIS — E860 Dehydration: Secondary | ICD-10-CM | POA: Diagnosis present

## 2011-08-05 DIAGNOSIS — M109 Gout, unspecified: Secondary | ICD-10-CM | POA: Diagnosis present

## 2011-08-05 LAB — CULTURE, BLOOD (ROUTINE X 2): Culture  Setup Time: 201211031205

## 2011-08-05 LAB — COMPREHENSIVE METABOLIC PANEL
CO2: 23 mEq/L (ref 19–32)
Calcium: 8.7 mg/dL (ref 8.4–10.5)
Creatinine, Ser: 1.59 mg/dL — ABNORMAL HIGH (ref 0.50–1.35)
GFR calc Af Amer: 46 mL/min — ABNORMAL LOW (ref >90)
GFR calc non Af Amer: 39 mL/min — ABNORMAL LOW (ref >90)
Glucose, Bld: 94 mg/dL (ref 70–99)

## 2011-08-05 LAB — CARDIAC PANEL(CRET KIN+CKTOT+MB+TROPI)
CK, MB: 4.2 ng/mL — ABNORMAL HIGH (ref 0.3–4.0)
Troponin I: <0.3 ng/mL (ref <0.30)

## 2011-08-05 LAB — CBC
HCT: 34.2 % — ABNORMAL LOW (ref 39.0–52.0)
MCH: 32.7 pg (ref 26.0–34.0)
MCV: 100.9 fL — ABNORMAL HIGH (ref 78.0–100.0)
RDW: 14.5 % (ref 11.5–15.5)
WBC: 7.6 10*3/uL (ref 4.0–10.5)

## 2011-08-05 LAB — DIFFERENTIAL
Basophils Absolute: 0 10*3/uL (ref 0.0–0.1)
Eosinophils Absolute: 0.2 10*3/uL (ref 0.0–0.7)
Eosinophils Relative: 2 % (ref 0–5)
Lymphocytes Relative: 23 % (ref 12–46)
Lymphs Abs: 1.7 10*3/uL (ref 0.7–4.0)
Monocytes Absolute: 0.7 10*3/uL (ref 0.1–1.0)

## 2011-08-05 MED ORDER — ALBUTEROL SULFATE (5 MG/ML) 0.5% IN NEBU
INHALATION_SOLUTION | RESPIRATORY_TRACT | Status: AC
  Administered 2011-08-05: 2.5 mg via RESPIRATORY_TRACT
  Filled 2011-08-05: qty 0.5

## 2011-08-05 MED ORDER — ACETAMINOPHEN 500 MG PO TABS
ORAL_TABLET | ORAL | Status: AC
  Filled 2011-08-05: qty 1

## 2011-08-05 MED ORDER — ACETAMINOPHEN 500 MG PO TABS
ORAL_TABLET | ORAL | Status: AC
  Administered 2011-08-05: 1000 mg via ORAL
  Filled 2011-08-05: qty 1

## 2011-08-05 NOTE — Progress Notes (Addendum)
Subjective: Patient states he's feeling much better. He denies any chills. He states is having good urine output.  Objective: Vital signs in last 24 hours: Filed Vitals:   08/05/11 0500 08/05/11 0838 08/05/11 0944 08/05/11 1411  BP:   113/60 134/75  Pulse:   90 85  Temp:   98.5 F (36.9 C) 97.5 F (36.4 C)  TempSrc:   Oral   Resp:   20 18  Height:      Weight: 68.8 kg (151 lb 10.8 oz)     SpO2:  92% 93% 97%    Intake/Output Summary (Last 24 hours) at 08/05/11 1531 Last data filed at 08/05/11 1300  Gross per 24 hour  Intake   2315 ml  Output   1450 ml  Net    865 ml    Weight change:   General: Alert, awake, oriented x3, in no acute distress. Heart: Regular rate and rhythm, without murmurs, rubs, gallops. Lungs: Clear to auscultation bilaterally. Abdomen: Soft, nontender, nondistended, positive bowel sounds. Extremities: No clubbing cyanosis or edema with positive pedal pulses. Neuro: Grossly intact, nonfocal.   Lab Results: Results for orders placed during the hospital encounter of 08/04/11 (from the past 24 hour(s))  CBC     Status: Abnormal   Collection Time   08/05/11  5:00 AM      Component Value Range   WBC 7.6  4.0 - 10.5 (K/uL)   RBC 3.39 (*) 4.22 - 5.81 (MIL/uL)   Hemoglobin 11.1 (*) 13.0 - 17.0 (g/dL)   HCT 34.2 (*) 39.0 - 52.0 (%)   MCV 100.9 (*) 78.0 - 100.0 (fL)   MCH 32.7  26.0 - 34.0 (pg)   MCHC 32.5  30.0 - 36.0 (g/dL)   RDW 14.5  11.5 - 15.5 (%)   Platelets 131 (*) 150 - 400 (K/uL)  DIFFERENTIAL     Status: Normal   Collection Time   08/05/11  5:00 AM      Component Value Range   Neutrophils Relative 65  43 - 77 (%)   Neutro Abs 5.0  1.7 - 7.7 (K/uL)   Lymphocytes Relative 23  12 - 46 (%)   Lymphs Abs 1.7  0.7 - 4.0 (K/uL)   Monocytes Relative 9  3 - 12 (%)   Monocytes Absolute 0.7  0.1 - 1.0 (K/uL)   Eosinophils Relative 2  0 - 5 (%)   Eosinophils Absolute 0.2  0.0 - 0.7 (K/uL)   Basophils Relative 0  0 - 1 (%)   Basophils Absolute 0.0   0.0 - 0.1 (K/uL)  COMPREHENSIVE METABOLIC PANEL     Status: Abnormal   Collection Time   08/05/11  5:00 AM      Component Value Range   Sodium 140  135 - 145 (mEq/L)   Potassium 4.1  3.5 - 5.1 (mEq/L)   Chloride 109  96 - 112 (mEq/L)   CO2 23  19 - 32 (mEq/L)   Glucose, Bld 94  70 - 99 (mg/dL)   BUN 38 (*) 6 - 23 (mg/dL)   Creatinine, Ser 1.59 (*) 0.50 - 1.35 (mg/dL)   Calcium 8.7  8.4 - 10.5 (mg/dL)   Total Protein 5.9 (*) 6.0 - 8.3 (g/dL)   Albumin 2.6 (*) 3.5 - 5.2 (g/dL)   AST 18  0 - 37 (U/L)   ALT 14  0 - 53 (U/L)   Alkaline Phosphatase 54  39 - 117 (U/L)   Total Bilirubin 0.1 (*) 0.3 - 1.2 (  mg/dL)   GFR calc non Af Amer 39 (*) >90 (mL/min)   GFR calc Af Amer 46 (*) >90 (mL/min)  CARDIAC PANEL(CRET KIN+CKTOT+MB+TROPI)     Status: Abnormal   Collection Time   08/05/11  5:00 AM      Component Value Range   Total CK 201  7 - 232 (U/L)   CK, MB 4.2 (*) 0.3 - 4.0 (ng/mL)   Troponin I <0.30  <0.30 (ng/mL)   Relative Index 2.1  0.0 - 2.5      Micro: Recent Results (from the past 240 hour(s))  CULTURE, BLOOD (ROUTINE X 2)     Status: Normal   Collection Time   08/04/11  8:00 AM      Component Value Range Status Comment   Specimen Description BLOOD RIGHT ARM   Final    Special Requests BOTTLES DRAWN AEROBIC AND ANAEROBIC [facility]   Final    Setup Time [id number]   Final    Culture     Final    Value: BACILLUS SPECIES     Note: Standardized susceptibility testing for this organism is not available.     Note: Gram Stain Report Called to,Read Back By and Verified With: Shila Robbs RN on 08/05/11 at 00:25 by Rise Mu   Report Status 08/05/2011 FINAL   Final   CULTURE, BLOOD (ROUTINE X 2)     Status: Normal (Preliminary result)   Collection Time   08/04/11  8:10 AM      Component Value Range Status Comment   Specimen Description BLOOD LEFT ARM   Final    Special Requests BOTTLES DRAWN AEROBIC AND ANAEROBIC [facility] EACH   Final    Setup Time [id number]   Final    Culture      Final    Value:        BLOOD CULTURE RECEIVED NO GROWTH TO DATE CULTURE WILL BE HELD FOR 5 DAYS BEFORE ISSUING A FINAL NEGATIVE REPORT   Report Status PENDING   Incomplete     Studies/Results: Dg Chest Portable 1 View  08/04/2011  *RADIOLOGY REPORT*  Clinical Data: Fever, cough  PORTABLE CHEST - 1 VIEW  Comparison: 07/10/2010  Findings: Decreased lung volumes with mild vascular congestion. Left midlung faint peripheral airspace opacity suspicious for underlying pneumonia.  Minimal basilar atelectasis.  No current CHF, effusion or pneumothorax.  Atherosclerosis of the aorta.  IMPRESSION: Left midlung airspace disease/pneumonia.  Decreased lung volumes and vascular congestion  Basilar atelectasis  Original Report Authenticated By: Jerilynn Mages. Daryll Brod, M.D.    Medications:     . albuterol  2.5 mg Nebulization Q6H  . allopurinol  100 mg Oral Daily  . aspirin EC  81 mg Oral Daily  . azithromycin  500 mg Intravenous Q24H  . B-complex with vitamin C  1 tablet Oral Daily  . cefTRIAXone (ROCEPHIN) IV  1 g Intravenous Q24H  . cholecalciferol  2,000 Units Oral QODAY  . docusate sodium  100 mg Oral BID  . enoxaparin (LOVENOX) injection  40 mg Subcutaneous Q24H  . Flora-Q  1 capsule Oral Daily  . guaiFENesin  600 mg Oral BID  . multivitamins ther. w/minerals  1 tablet Oral Daily  . Tamsulosin HCl  0.4 mg Oral Daily  . DISCONTD: cholecalciferol  2,000 Units Oral QODAY     Assessment/Plan Principal Problem:  1.CAP (community acquired pneumonia)- Clinica improvement. Patient afebrile. Showed a normal white count. Blood cultures are pending. Sputum Gram stain and culture. 2 new  IV azithromycin IV ceftriaxone, Mucinex oxygen. 2. Dehydration-continue IV fluids. 3. Hypotension-improved with hydration. Blood cultures are pending. Sputum Gram stain and culture. IV fluids 4. Hypoxia-likely secondary to #1. #1. 5. Acute on Problable CKD- Secondary to prerenal azotemia. Improving with IVF. Close to baseline.  UA negative.  5.BPH (benign prostatic hyperplasia)- good urinary output. Continue Flomax.  6.Neuropathy- stable. 7.  Gout- continue allopurinol. 8. Prophylaxis- Lovenox for DVT prophylaxis     LOS: 1 day   THOMPSON,DANIEL 08/05/2011, 3:31 PM

## 2011-08-05 NOTE — H&P (Signed)
NAMEJOSHUIA, BERGSTEIN NO.:  [id number]  MEDICAL RECORD NO.:  [id number]  LOCATION:  [id number]                         FACILITY:  [facility]  PHYSICIAN:  Erma Pinto, MD       DATE OF BIRTH:  Nov 04, 1928  DATE OF ADMISSION:  08/04/2011 DATE OF DISCHARGE:                             HISTORY & PHYSICAL   PRIMARY CARE PHYSICIAN:  Viviann Spare. Nyoka Cowden, M.D.  CHIEF COMPLAINT:  Fever, chills, and weakness.  HISTORY OF PRESENT ILLNESS:  Mr. Talan is an 77 year old Caucasian male who began with cold symptoms 4 days ago on Wednesday.  He had cough sore throat, fever, runny nose, sneezing.  On Thursday, 3 days ago, he went to see his primary care physician and was started on amoxicillin and cough medicine.  The cough medicine had mucolytic and antihistamine.  He took a few doses of the medication and was feeling better, but this morning and yesterday evening he felt like his symptoms had moved into his lung or into his chest.  This morning he awoke with shaking chills, fever, and weakness.  He was so weak that he was unable to get up and get dressed to go to the emergency room, so they called paramedics and was brought to emergency room.  On arrival, he was tachycardic and hypoxic with O2 sat of 84% on room air.  Initially, it was normotensive, but his blood pressure dropped while in the emergency room from being normotensive to having blood pressure at 90/60.  He was given IV bolus. He was started on antibiotics, Rocephin, and Zithromax, and was put on oxygen.  He had a temperature in the emergency room of 101.2, and post Tylenol his temperature has dropped down to 97.7.  According to family, once he started taking his medicine, he began having problems urinating and they were not sure whether he was having problems urinating because of the urinary tract infection or the cough medicine which had the antihistamine.  Patient at [facility]  Emergency Room had an EKG which showed sinus  tachycardia.  Chest x-ray showed left mid lung airspace disease, pneumonia, decreased lung volumes, vascular congestion, and basilar atelectasis.  LABORATORY DATA:  His white count was 8000, hemoglobin was 12.9, hematocrit 39.4%.  His differential showed a left shift with 86% neutrophils.  Urinalysis showed trace blood.  Microscopic urinalysis showed 7 to10 RBCs per high-power field, many bacteria, hyaline casts. Blood cultures were obtained.  A basic metabolic panel showed a sodium of 136, potassium of 5.0 chloride of 100, CO2 of 25, glucose was 113, blood urea nitrogen 48, creatinine 1.9, calcium was 9.7, GFR was 32. GFR calculated was 37, both of which were low.  A lactic acid was done which was in the normal range.  Protime was 13.8 with an INR of 1.0. Procalcitonin was less than 0.1 which is less likely for sepsis.  Blood and urine cultures were done.  Troponin was less than 0.30.  BNP was elevated at 760.  PAST MEDICAL HISTORY: 1. Significant for hypertension. 2. Peripheral neuropathy. 3. Gout. 4. Benign prostatic hypertrophy.  PAST SURGICAL HISTORY:  He has had left hernia repair, and had a  left tib-fib open reduction and internal fixation due to a fracture.  SOCIAL HISTORY:  He is married.  He does not smoke, drink, or use illicit drugs.  He has not smoked in 20 years.  REVIEW OF SYSTEMS:  As per history of present illness.  He had fever, chills, cough, sore throat, and last few days, body aches and weakness. The remainder of his review of systems is negative for any positive findings.  Only findings are his chronic medical problems and his acute illness that was described in history of present illness.  MEDICATIONS:  He takes, 1. Flomax 0.4 mg daily. 2. Zyloprim 100 mg daily. 3. He was recently started on amoxicillin 500 mg twice a day and had     taken 4 doses. 4. Tussionex, chlorpheniramine, and hydrocodone 5 mL twice a day. 5. Vasotec 5 mg. 6. Robitussin. 7. He  was also given Tamiflu; however, he did have a flu vaccine in     October. 8. In the emergency room he received a saline bolus, Zithromax 500 mg     IV, Tylenol 1000 mg, and Rocephin 1 g.  He had 2 boluses of sodium     with total of 2L of fluid.  ALLERGIES:  He has no known drug allergies.  PHYSICAL EXAMINATION:  VITAL SIGNS:  His temperature is 97.7, pulse 77, respirations 16, blood pressure 114/65, O2 sat on 2L is 97%. GENERAL:  He is well developed, well nourished, in no acute distress, posttreatment at [facility] ER. HEENT:  Examination of his head is atraumatic, normocephalic.  Eyes, pupils are equal, round, reactive to light.  Discs sharp.  Extraocular muscle range of motion is full.  External ear canal, tympanic membrane appear normal.  Oropharynx is slightly erythematous, but no exudate or lesions are noted. NECK:  Supple without jugular venous distention, thyromegaly, or thyroid mass. CHEST:  Nontender. LUNGS:  Clear to auscultation and percussion.  No rales, rhonchi, or wheezing noted. HEART:  Currently, regular rhythm and rate.  Normal S1, S2 without murmur, gallop, or rub. ABDOMEN:  Soft, nontender with normoactive bowel sounds.  No hepatomegaly.  No splenomegaly.  No palpable mass. RECTAL:  He has had enlarged prostate.  Stool is brown.  Hemoccult is pending. EXTREMITIES:  There is no clubbing, cyanosis, or edema. SKIN:  No unusual rash or lesions noted. NEUROLOGIC:  He is awake, alert, and oriented x3.  Cranial nerves II through XII are intact.  Motor is 5/5 in upper and lower extremities. He has generalized weakness, but no focal deficit.  Sensory, he has femur atrophy in left lower leg which is chronic due to his peripheral neuropathy.  LABS AND X-RAYS:  As discussed.  IMPRESSION: 1. Community-acquired pneumonia. 2. Dehydration causing acute renal failure or renal insufficiency,     fever, urinary tract infection, etiology is uncertain whether it is      secondary to antihistamine use, which he started when his cold     started 3 days ago, or due to urinary tract infection or due to his     benign prostatic hypertrophy. 3. Hypotension, etiology uncertain.  The hypotension may be just due     to his tachycardia from his pneumonia and acute illness.  His BNP     is elevated, but troponin is normal. 4. Hypoxemia, most likely secondary to his pneumonia.  PLAN:  My plan is to admit the patient to the telemetry bed.  Continue with IV fluids, IV antibiotics, and monitor his urine  output.  If necessary, a Foley catheter will be placed.  Strict I's and O's. Because of his elevated BNP and increased congestion on chest x-ray, I will order an echocardiogram and repeat cardiac profile and EKG in the morning.  Patient is denying any chest pain.  Patient will be continued on IV antibiotics.  Further evaluation and treatment as indicated by hospital course.  Patient will be going to [facility] team 8.     Erma Pinto, MD     ML/MEDQ  D:  08/04/2011  T:  08/05/2011  Job:  [id number]  cc:   Viviann Spare. Nyoka Cowden, M.D. Fax: [phone]

## 2011-08-05 NOTE — Progress Notes (Signed)
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

## 2011-08-06 DIAGNOSIS — N183 Chronic kidney disease, stage 3 unspecified: Secondary | ICD-10-CM | POA: Diagnosis present

## 2011-08-06 DIAGNOSIS — D539 Nutritional anemia, unspecified: Secondary | ICD-10-CM

## 2011-08-06 LAB — BASIC METABOLIC PANEL WITH GFR
BUN: 32 mg/dL — ABNORMAL HIGH (ref 6–23)
CO2: 23 meq/L (ref 19–32)
Calcium: 9.3 mg/dL (ref 8.4–10.5)
Chloride: 108 meq/L (ref 96–112)
Creatinine, Ser: 1.49 mg/dL — ABNORMAL HIGH (ref 0.50–1.35)
GFR calc Af Amer: 49 mL/min — ABNORMAL LOW
GFR calc non Af Amer: 43 mL/min — ABNORMAL LOW
Glucose, Bld: 100 mg/dL — ABNORMAL HIGH (ref 70–99)
Potassium: 4.3 meq/L (ref 3.5–5.1)
Sodium: 139 meq/L (ref 135–145)

## 2011-08-06 LAB — CBC
MCHC: 31.5 g/dL (ref 30.0–36.0)
RDW: 14.5 % (ref 11.5–15.5)
WBC: 5.6 10*3/uL (ref 4.0–10.5)

## 2011-08-06 LAB — DIFFERENTIAL
Basophils Absolute: 0 10*3/uL (ref 0.0–0.1)
Basophils Relative: 0 % (ref 0–1)
Lymphocytes Relative: 27 % (ref 12–46)
Neutro Abs: 3.4 10*3/uL (ref 1.7–7.7)
Neutrophils Relative %: 61 % (ref 43–77)

## 2011-08-06 LAB — URINE CULTURE
Colony Count: NO GROWTH
Culture  Setup Time: 201211040015
Culture: NO GROWTH

## 2011-08-06 MED ORDER — ALBUTEROL SULFATE (5 MG/ML) 0.5% IN NEBU
2.5000 mg | INHALATION_SOLUTION | Freq: Four times a day (QID) | RESPIRATORY_TRACT | Status: DC
  Filled 2011-08-06 (×2): qty 0.5

## 2011-08-06 MED ORDER — ENALAPRIL MALEATE 5 MG PO TABS
5.0000 mg | ORAL_TABLET | Freq: Every day | ORAL | Status: DC
  Administered 2011-08-06 – 2011-08-07 (×2): 5 mg via ORAL
  Filled 2011-08-06 (×2): qty 1

## 2011-08-06 MED ORDER — ALBUTEROL SULFATE (5 MG/ML) 0.5% IN NEBU
INHALATION_SOLUTION | RESPIRATORY_TRACT | Status: AC
  Filled 2011-08-06: qty 0.5

## 2011-08-06 MED ORDER — ALBUTEROL SULFATE (5 MG/ML) 0.5% IN NEBU
INHALATION_SOLUTION | RESPIRATORY_TRACT | Status: AC
  Administered 2011-08-06: 2.5 mg via RESPIRATORY_TRACT
  Filled 2011-08-06: qty 0.5

## 2011-08-06 MED ORDER — ALBUTEROL SULFATE (5 MG/ML) 0.5% IN NEBU
2.5000 mg | INHALATION_SOLUTION | RESPIRATORY_TRACT | Status: DC | PRN
  Filled 2011-08-06: qty 0.5

## 2011-08-06 NOTE — Clinical Documentation Improvement (Signed)
Clinical Documentation Improvement Program  Query Request  Dr. Maggie Font,  Please clarify your note written on 08/05/11 @ 3:31pm.   Under Subjective heading it reads... Pt states she's feeling much better. She denies chills. She states is having good urine output.  According to H/P this pt is an 77 year old caucasian male.   Please clarify the sex of the pt  for accuracy of documentation.      In an effort to better capture your patient's severity of illness, reflect appropriate length of stay and utilization of resources, a review of the patient medical record has revealed the following indicators. Please exercise your independent judgment. The fact queries are asked, does not imply that any particular answer is desired or expected. Please clarify and document in a progress note and/or discharge summary the clinical condition associated with the following supporting information. Conditions documented as possible, probable, or suspected can be coded if restated at the time of discharge.    Thank you. If you have any questions, please contact me  Heloise Beecham Clinical Documentation Specialist Office [phone]   08/08/11: NAD from MD

## 2011-08-06 NOTE — Progress Notes (Signed)
Physical Therapy Evaluation Patient Details Name: GRASON DRAWHORN MRN: [id number] DOB: 1928/11/01 Today's Date: 08/06/2011  Time: [id number]   Tier II eval Problem List:  Patient Active Problem List  Diagnoses  . CAP (community acquired pneumonia)  . Dehydration  . Hypotension  . Hypoxia  . BPH (benign prostatic hyperplasia)  . Neuropathy  . Gout  . CKD (chronic kidney disease), stage III  . Anemia, macrocytic    Past Medical History:  Past Medical History  Diagnosis Date  . Gout   . Hypertension   . Peripheral neuropathy   . Enlarged prostate    Past Surgical History:  Past Surgical History  Procedure Date  . Hernia repair     PT Assessment/Plan/Recommendation PT Assessment Clinical Impression Statement:  Pt presents with a medical diagnosis of PNA. Pt will benefit from skilled PT services in the acute care setting to improve strength, activity tolerance, stability with ambulation, and overall safety with basic functional mobility in preparation for DC home with wife. PT Recommendation/Assessment: Patient will need skilled PT in the acute care venue PT Problem List: Decreased strength;Decreased activity tolerance;Decreased balance PT Therapy Diagnosis : Difficulty walking;Abnormality of gait;Generalized weakness PT Plan PT Frequency: Min 3X/week PT Treatment/Interventions: Gait training;Stair training;Functional mobility training;Patient/family education PT Recommendation Recommendations for Other Services: OT consult Follow Up Recommendations: Home health PT PT Goals  Acute Rehab PT Goals PT Goal Formulation: With patient/family Time For Goal Achievement: 7 days Pt will go Supine/Side to Sit: with modified independence;with HOB 0 degrees PT Goal: Supine/Side to Sit - Progress: Progressing toward goal Pt will Transfer Sit to Stand/Stand to Sit: with supervision PT Transfer Goal: Sit to Stand/Stand to Sit - Progress: Progressing toward goal Pt will Stand: with  supervision PT Goal: Stand - Progress: Progressing toward goal Pt will Ambulate: >150 feet;with supervision;with rolling walker PT Goal: Ambulate - Progress: Progressing toward goal Pt will Go Up / Down Stairs: Flight;with rail(s);with supervision PT Goal: Up/Down Stairs - Progress: Progressing toward goal  PT Evaluation Precautions/Restrictions  Precautions Precautions: Fall Prior Functioning  Home Living Lives With: Spouse Receives Help From: Family Type of Home: House Home Layout: Two level;Able to live on main level with bedroom/bathroom;Other (Comment) (Pt showers downstairs in basement. Has tub available on main) Alternate Level Stairs-Rails: Right;Left;Can reach both Alternate Level Stairs-Number of Steps: 1 flight to basement Home Access: Ramped entrance;Stairs to enter Bathroom Shower/Tub: Tub only Biochemist, clinical: Standard Home Adaptive Equipment: Wheelchair - Database administrator - four wheeled;Walker - rolling;Straight cane;Shower chair with back;Shower chair without back Additional Comments: Has shower chair. Not sure if it has back or not. Prior Function Level of Independence: Requires assistive device for independence Cognition Cognition Arousal/Alertness: Awake/alert Overall Cognitive Status: Appears within functional limits for tasks assessed Orientation Level: Oriented X4 Sensation/Coordination   Extremity Assessment RLE Assessment RLE Assessment: Exceptions to [facility] RLE Strength Right Ankle Dorsiflexion:  (Foot drop) LLE Assessment LLE Assessment: Exceptions to [facility] LLE Strength Left Ankle Dorsiflexion:  (Foot drop) Mobility (including Balance) Bed Mobility Bed Mobility: Yes Transfers Transfers: Yes Sit to Stand: 4: Min assist;From bed;From chair/3-in-1 Sit to Stand Details (indicate cue type and reason): Assist to rise and stabililze. Stand to Sit: 4: Min assist Stand to Sit Details: Assist to control descent. Stand Pivot Transfers: 4: Min Biochemist, clinical Details (indicate cue type and reason): Assist to steady Ambulation/Gait Ambulation/Gait: Yes Ambulation/Gait Assistance: 4: Min assist Ambulation/Gait Assistance Details (indicate cue type and reason): Assist to steady. Pt utilizes steppage gait pattern  due to bilateral foot drop and neuropathy Ambulation Distance (Feet): 150 Feet Assistive device: Rolling walker Gait Pattern: Left steppage;Right steppage Stairs: No  Balance Balance Assessed: No (History of significant fall risk requiring assistive device) Exercise    End of Session PT - End of Session Equipment Utilized During Treatment: Gait belt Activity Tolerance: Patient tolerated treatment well Patient left: in chair;with call bell in reach;with family/visitor present General Behavior During Session: [facility] for tasks performed Cognition: [facility] for tasks performed  Weston Anna [facility] 08/06/2011, 2:53 PM

## 2011-08-06 NOTE — Progress Notes (Signed)
  Echocardiogram 2D Echocardiogram has been performed.  [place], RDCS 08/06/2011, 1:34 PM

## 2011-08-06 NOTE — Progress Notes (Addendum)
Subjective: Mr. Orne is feeling better today. He denies dyspnea. He does have a cough that is nonproductive. He states that his appetite is good, his bowels are moving normally, and that he no longer has urinary retention.    Objective:  Vital signs in last 24 hours:  Filed Vitals:   08/06/11 0202 08/06/11 0504 08/06/11 0827 08/06/11 1030  BP: 116/71 159/79  144/79  Pulse: 77 84  84  Temp: 96.7 F (35.9 C) 97.7 F (36.5 C)  98 F (36.7 C)  TempSrc: Axillary Oral  Oral  Resp: 18 18  19   Height:      Weight:  68.9 kg (151 lb 14.4 oz)    SpO2: 100% 93% 98% 94%    Intake/Output from previous day:   Intake/Output Summary (Last 24 hours) at 08/06/11 1321 Last data filed at 08/06/11 1100  Gross per 24 hour  Intake 2338.75 ml  Output   2400 ml  Net -61.25 ml    Physical Exam: General Appearance:    Alert, cooperative, no distress, appears stated age  Lungs:     Dminished to auscultation bilaterally, respirations unlabored   Heart:    Regular rate and rhythm, S1 and S2 normal, no murmur, rub   or gallop  Abdomen:     Soft, non-tender, bowel sounds active all four quadrants,    no masses, no organomegaly  Extremities:   Extremities normal, atraumatic, no cyanosis or edema  Pulses:   1+ and symmetric all extremities    Lab Results:  Basic Metabolic Panel:    Component Value Date/Time   NA 139 08/06/2011 0400   K 4.3 08/06/2011 0400   CL 108 08/06/2011 0400   CO2 23 08/06/2011 0400   BUN 32* 08/06/2011 0400   CREATININE 1.49* 08/06/2011 0400   GLUCOSE 100* 08/06/2011 0400   CALCIUM 9.3 08/06/2011 0400   CBC:    Component Value Date/Time   WBC 5.6 08/06/2011 0400   HGB 11.0* 08/06/2011 0400   HCT 34.9* 08/06/2011 0400   PLT 151 08/06/2011 0400   MCV 101.7* 08/06/2011 0400   NEUTROABS 3.4 08/06/2011 0400   LYMPHSABS 1.5 08/06/2011 0400   MONOABS 0.5 08/06/2011 0400   EOSABS 0.2 08/06/2011 0400   BASOSABS 0.0 08/06/2011 0400    Recent Results (from the past 240 hour(s))    CULTURE, BLOOD (ROUTINE X 2)     Status: Normal   Collection Time   08/04/11  8:00 AM      Component Value Range Status Comment   Specimen Description BLOOD RIGHT ARM   Final    Special Requests BOTTLES DRAWN AEROBIC AND ANAEROBIC [facility]   Final    Setup Time [id number]   Final    Culture     Final    Value: BACILLUS SPECIES     Note: Standardized susceptibility testing for this organism is not available.     Note: Gram Stain Report Called to,Read Back By and Verified With: Shila Robbs RN on 08/05/11 at 00:25 by Rise Mu   Report Status 08/05/2011 FINAL   Final   CULTURE, BLOOD (ROUTINE X 2)     Status: Normal (Preliminary result)   Collection Time   08/04/11  8:10 AM      Component Value Range Status Comment   Specimen Description BLOOD LEFT ARM   Final    Special Requests BOTTLES DRAWN AEROBIC AND ANAEROBIC [facility]   Final    Setup Time [id number]   Final  Culture     Final    Value:        BLOOD CULTURE RECEIVED NO GROWTH TO DATE CULTURE WILL BE HELD FOR 5 DAYS BEFORE ISSUING A FINAL NEGATIVE REPORT   Report Status PENDING   Incomplete   URINE CULTURE     Status: Normal   Collection Time   08/04/11  9:52 AM      Component Value Range Status Comment   Specimen Description URINE, CATHETERIZED   Final    Special Requests NONE   Final    Setup Time [id number]   Final    Colony Count NO GROWTH   Final    Culture NO GROWTH   Final    Report Status 08/06/2011 FINAL   Final     Studies/Results: No results found.  Medications: Scheduled Meds:   . acetaminophen      . albuterol  2.5 mg Nebulization Q6H  . albuterol      . albuterol      . allopurinol  100 mg Oral Daily  . aspirin EC  81 mg Oral Daily  . azithromycin  500 mg Intravenous Q24H  . B-complex with vitamin C  1 tablet Oral Daily  . cefTRIAXone (ROCEPHIN) IV  1 g Intravenous Q24H  . cholecalciferol  2,000 Units Oral QODAY  . docusate sodium  100 mg Oral BID  . enoxaparin (LOVENOX) injection  40 mg  Subcutaneous Q24H  . Flora-Q  1 capsule Oral Daily  . guaiFENesin  600 mg Oral BID  . multivitamins ther. w/minerals  1 tablet Oral Daily  . Tamsulosin HCl  0.4 mg Oral Daily   Continuous Infusions:   . sodium chloride 75 mL/hr at 08/05/11 1500   PRN Meds:.acetaminophen, albuterol, alum & mag hydroxide-simeth, ondansetron, ondansetron, senna, zolpidem  Assessment/Plan:  Principal Problem:  *CAP (community acquired pneumonia): The patient is on day #2 of Rocephin and 8 azithromycin. He is afebrile with no leukocytosis. His culture data is negative to date. He is improving clinically and likely will be stable for discharge within the next 24 hours. Active Problems:  Dehydration: The patient has been receiving IV fluids and at this time, appears to be euvolemic. His kidney function is better than baseline values. This point, would normal saline lock his IV.  Hypotension/h/o hypertension: The patient is hemodynamically stable at this time. His hypotension has resolved and was likely related to his pneumonia. Nevertheless, a two-dimensional echocardiogram was performed and the results are pending at the time of this dictation.  I will resume his vasotec.  Hypoxia: The patient's hypoxia has improved. He is maintaining his oxygen saturation levels.  BPH (benign prostatic hyperplasia): The patient complained of urinary hesitancy and required  I and O. catheterization on his initial presentation. Fortunately, urine cultures were sent at the time of his admission, and these were negative..  The patient was reports that his symptoms have resolved and he is urinating normally. Of note, he is on Flomax.  Neuropathy: The patient ambulates with a walker due to his long-standing peripheral neuropathy. At this point, physical therapy is going to evaluate the patient's gait, but he can likely return home in the morning.  Gout: The patient continues to take allopurinol for gout prevention. He is not complaining  of any acute flares  CKD (chronic kidney disease), stage III: The patient's baseline creatinine ranges anywhere from 1.4-1.6. His current creatinine is certainly within that range. His creatinine has improved slightly but his GFR is still consistent  with stage III chronic kidney disease. Patient is aware of this and reports that his primary care physician, Dr. Nyoka Cowden, is aware of this as well.  Macrocytic anemia: Will check [id number] and folic acid studies if these have not been done recently to ensure that he does not have any nutritional deficiencies to account for this. His anemia is mild.   LOS: 2 days   Chloris Marcoux 08/06/2011, 1:21 PM

## 2011-08-06 NOTE — Progress Notes (Signed)
[id number]/[employer] [employer]/Utilization review of Chart performed. 

## 2011-08-07 LAB — DIFFERENTIAL
Basophils Absolute: 0 10*3/uL (ref 0.0–0.1)
Lymphocytes Relative: 25 % (ref 12–46)
Lymphs Abs: 1.3 10*3/uL (ref 0.7–4.0)
Monocytes Absolute: 0.5 10*3/uL (ref 0.1–1.0)
Monocytes Relative: 10 % (ref 3–12)
Neutro Abs: 3.2 10*3/uL (ref 1.7–7.7)

## 2011-08-07 LAB — BASIC METABOLIC PANEL
CO2: 25 mEq/L (ref 19–32)
Chloride: 105 mEq/L (ref 96–112)
Creatinine, Ser: 1.19 mg/dL (ref 0.50–1.35)
Glucose, Bld: 94 mg/dL (ref 70–99)

## 2011-08-07 LAB — CBC
HCT: 34 % — ABNORMAL LOW (ref 39.0–52.0)
Hemoglobin: 10.8 g/dL — ABNORMAL LOW (ref 13.0–17.0)
RBC: 3.44 MIL/uL — ABNORMAL LOW (ref 4.22–5.81)
RDW: 14.2 % (ref 11.5–15.5)
WBC: 5.3 10*3/uL (ref 4.0–10.5)

## 2011-08-07 LAB — VITAMIN B12: Vitamin B-12: 916 pg/mL — ABNORMAL HIGH (ref 211–911)

## 2011-08-07 MED ORDER — MOXIFLOXACIN HCL 400 MG PO TABS: 400.0000 mg | ORAL_TABLET | Freq: Every day | ORAL | Status: AC

## 2011-08-07 MED ORDER — ASPIRIN 81 MG PO TBEC: 81.0000 mg | DELAYED_RELEASE_TABLET | Freq: Every day | ORAL | Status: AC

## 2011-08-07 NOTE — Progress Notes (Signed)
Occupational Therapy  Chart reviewed. Spoke with patient and family and there are no current acute OT needs. Patient is supposed to discharge home today. Wife is available to assist with all ADL and patient has all OT DME. He does not feel he needs to practice any of his ADL further before discharge.

## 2011-08-07 NOTE — Discharge Summary (Signed)
Patient ID: Drew Gibson MRN: [id number] DOB/AGE: [birth year] 77 y.o.  Admit date: 08/04/2011 Discharge date: 08/07/2011  Primary Care Physician: Drew Gibson  Discharge Diagnoses:    Present on Admission:  .CAP (community acquired pneumonia) .Dehydration .Hypotension .Hypoxia .BPH (benign prostatic hyperplasia) .Neuropathy .Gout .CKD (chronic kidney disease), stage III  Principal Problem:  *CAP (community acquired pneumonia) Active Problems:  Dehydration  Hypotension  Hypoxia  BPH (benign prostatic hyperplasia)  Neuropathy  CKD (chronic kidney disease), stage III  Anemia, macrocytic  Gout   Current Discharge Medication List    START taking these medications   Details  aspirin EC 81 MG EC tablet Take 1 tablet (81 mg total) by mouth daily.    moxifloxacin (AVELOX) 400 MG tablet Take 1 tablet (400 mg total) by mouth daily. Qty: 5 tablet, Refills: 0      CONTINUE these medications which have NOT CHANGED   Details  acetaminophen (TYLENOL) 500 MG tablet Take 500 mg by mouth every 6 (six) hours as needed. For pain     allopurinol (ZYLOPRIM) 100 MG tablet Take 100 mg by mouth daily.      b complex vitamins capsule Take 1 capsule by mouth daily.      cholecalciferol (VITAMIN D) 1000 UNITS tablet Take 2,000 Units by mouth every other day.      enalapril (VASOTEC) 5 MG tablet Take 5 mg by mouth daily.      Multiple Vitamins-Minerals (MULTIVITAMINS THER. W/MINERALS) TABS Take 1 tablet by mouth daily.      OVER THE COUNTER MEDICATION Apply 1 application topically at bedtime as needed. Phys cream. For neuropathy pain     Probiotic Product (PROBIOTIC FORMULA PO) Take 1 capsule by mouth daily.      Tamsulosin HCl (FLOMAX) 0.4 MG CAPS Take 0.4 mg by mouth daily.          Disposition and Follow-up: The patient is being discharged home. He can followup with his primary care physician in one week.  Consults:  None  Significant Diagnostic Studies:  PORTABLE CHEST -  1 VIEW  Comparison: 07/10/2010  Findings: Decreased lung volumes with mild vascular congestion.  Left midlung faint peripheral airspace opacity suspicious for  underlying pneumonia. Minimal basilar atelectasis. No current  CHF, effusion or pneumothorax. Atherosclerosis of the aorta.  IMPRESSION:  Left midlung airspace disease/pneumonia.  Decreased lung volumes and vascular congestion  Basilar atelectasis  Original Report Authenticated By: Jerilynn Mages. Daryll Brod, M.D.  2-D ECHOCARDIOGRAM: Results pending.  Brief H and P: For complete details please refer to admission H and P, but in brief [name] is an 77 year old Caucasian male who began with cold symptoms 4 days prior to admission. He had cough, sore  throat, fever, runny nose, sneezing. 3 days prior to admission, he went  to see his primary care physician and was started on amoxicillin and  cough medicine. The cough medicine had mucolytic and antihistamine. He  took a few doses of the medication and was feeling better, but on the date of admission  and the prior evening,  he felt like his symptoms had moved into  his lungs and chest. On the morning of his admission, he awoke with shaking chills,  fever, and weakness. He was so weak that he was unable to get up and  get dressed to go to the emergency room, so they called paramedics and  was brought to emergency room. On arrival, he was tachycardic and  hypoxic with O2 sat of 84% on room  air and was referred to the hospitalist service for further evaluation and treatment.  Physical Exam on Discharge:  Filed Vitals:   08/06/11 2245 08/07/11 0253 08/07/11 0620 08/07/11 0622  BP: 126/73 138/77 145/87   Pulse: 85 82 91   Temp: 99.7 F (37.6 C) 98.6 F (37 C) 99.1 F (37.3 C)   TempSrc: Oral Oral Oral   Resp: 18 18 18    Height:      Weight:    70 kg (154 lb 5.2 oz)  SpO2: 93% 93% 94%      Intake/Output Summary (Last 24 hours) at 08/07/11 0925 Last data filed at 08/07/11 0825   Gross per 24 hour  Intake   1030 ml  Output   2865 ml  Net  -1835 ml    General: Alert, awake, oriented x3, in no acute distress. HEENT: No bruits, no goiter. Heart: Regular rate and rhythm, without murmurs, rubs, gallops. Lungs: Clear to auscultation bilaterally. Abdomen: Soft, nontender, nondistended, positive bowel sounds. Extremities: No clubbing cyanosis or edema with positive pedal pulses. Neuro: Grossly intact, nonfocal.  CBC:    Component Value Date/Time   WBC 5.3 08/07/2011 0438   HGB 10.8* 08/07/2011 0438   HCT 34.0* 08/07/2011 0438   PLT 171 08/07/2011 0438   MCV 98.8 08/07/2011 0438   NEUTROABS 3.2 08/07/2011 0438   LYMPHSABS 1.3 08/07/2011 0438   MONOABS 0.5 08/07/2011 0438   EOSABS 0.2 08/07/2011 0438   BASOSABS 0.0 08/07/2011 [id number]    Basic Metabolic Panel:    Component Value Date/Time   NA 139 08/07/2011 0438   K 4.4 08/07/2011 0438   CL 105 08/07/2011 0438   CO2 25 08/07/2011 0438   BUN 19 08/07/2011 0438   CREATININE 1.19 08/07/2011 0438   GLUCOSE 94 08/07/2011 0438   CALCIUM 9.4 08/07/2011 0438    Hospital Course:  Principal Problem:  *CAP (community acquired pneumonia): The patient has completed 3 days of therapy with azithromycin and Rocephin. He'll be discharged on by mouth Avelox and will take an additional 5 days of this for a total treatment course of 8 days of therapy. He is not dyspneic at this time. He does have a residual nonproductive cough but has been afebrile with normalization of his white blood cell count. Culture data has been negative to date. Active Problems:  Dehydration: The patient's BUN and creatinine have normalized and he is no longer dehydrated.  Hypotension/ h/o HTN: The patient's blood pressure has stabilized and he was resumed back on his Vasotec yesterday.  Of note, a two-dimensional echocardiogram was ordered and has been done but not yet read. He will need to followup with his primary care physician for the results of this test. He does  not have any signs or symptoms suggestive of congestive heart failure.  Hypoxia: The patient is maintaining his oxygen saturations on room air.  BPH (benign prostatic hyperplasia): The patient will continue on Flomax.  Neuropathy: The patient ambulates with a walker.  CKD (chronic kidney disease), stage III: The patient's renal function is better than his usual baseline. He can followup with his primary care physician for ongoing surveillance. Anemia, macrocytic: Vitamin 123456 and folic acid studies are pending at the time of this dictation but his anemia is mild and he can followup with Dr. Nyoka Cowden for further evaluation.  Gout: Quiescent. The patient remains on allopurinol.  Recommendations for Follow-Up: 1.  Follow-up on two-dimensional echocardiogram performed on 08/06/2011 2.  Follow-up on vitamin 123456 and folic acid  studies which are pending at the time of this dictation.  Time spent on Discharge: 40 minutes   Signed: RAMA,CHRISTINA 08/07/2011, 9:25 AM

## 2011-08-07 NOTE — Progress Notes (Signed)
Pt discharged before physical therapy able to see for tx today.

## 2011-08-08 LAB — FOLATE RBC: RBC Folate: 1699 ng/mL — ABNORMAL HIGH (ref >=366)

## 2011-08-10 LAB — CULTURE, BLOOD (ROUTINE X 2)

## 2012-08-15 ENCOUNTER — Telehealth (INDEPENDENT_AMBULATORY_CARE_PROVIDER_SITE_OTHER): Payer: Self-pay | Admitting: General Surgery

## 2012-08-15 NOTE — Telephone Encounter (Signed)
Recurrent LIH and has had bulge in the area with some increased pain.  Had a bulge in the groin and he says that it has reduced it now and his pain has improved. I recommended that if the pain persists or if the bulge does not reduce then he should go the ER for evaluation and possible emergent repair.  He says that it is back down to about the same as it has been for the last few weeks and agreed to come in if it persists or the bulge does not reduce.

## 2012-08-21 ENCOUNTER — Ambulatory Visit (INDEPENDENT_AMBULATORY_CARE_PROVIDER_SITE_OTHER): Payer: Medicare Other | Admitting: General Surgery

## 2012-08-21 ENCOUNTER — Encounter (INDEPENDENT_AMBULATORY_CARE_PROVIDER_SITE_OTHER): Payer: Self-pay | Admitting: General Surgery

## 2012-08-21 VITALS — BP 140/84 | HR 60 | Temp 97.0°F | Resp 18 | Ht 70.0 in | Wt 160.0 lb

## 2012-08-21 DIAGNOSIS — K4091 Unilateral inguinal hernia, without obstruction or gangrene, recurrent: Secondary | ICD-10-CM

## 2012-08-21 NOTE — Progress Notes (Signed)
Patient ID: Drew Gibson, male   DOB: July 03, 1929, 78 y.o.   MRN: [id number]  Chief Complaint  Patient presents with  . New Evaluation    hernia    HPI Drew Gibson is a 78 y.o. male.   HPI 78 yo WM referred by Dr Nyoka Cowden for evaluation of a left inguinal hernia. The patient had previously undergone an open left inguinal hernia repair with mesh in 1998 by Dr. Rosana Hoes. The patient was in his usual state of health until July when he noted some intermittent discomfort in his left groin. At that time his primary care physician did not notice a hernia on exam. However since the summer he has had worsening discomfort in his left groin.he complains of a sharp pain in his left groin. It is inferior to this incision. The pain is intermittent. However it is now almost occurring on a daily basis. Standing for prolonged period of time as well as bending over increases the pain and discomfort. If he has a bowel movement it decreases the discomfort. He states that he has noticed a bulge in his groin and it is always there. He denies any nausea, vomiting, diarrhea or constipation. He takes Flomax on a daily basis. He walks about 25 minutes a day in a park Past Medical History  Diagnosis Date  . Gout   . Hypertension   . Peripheral neuropathy   . Enlarged prostate     Past Surgical History  Procedure Date  . Hernia repair     Family History  Problem Relation Age of Onset  . Diabetes Mother     type 2  . Leukemia Father   . COPD Brother     Social History History  Substance Use Topics  . 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 Smokeless tobacco: Not on file     Comment: 25 yrs ago  . Alcohol Use: No    No Known Allergies  Current Outpatient Prescriptions  Medication Sig Dispense Refill  . acetaminophen (TYLENOL) 500 MG tablet Take 500 mg by mouth every 6 (six) hours as needed. For pain       . allopurinol (ZYLOPRIM) 100 MG tablet Take 100 mg by mouth daily.        Marland Kitchen aspirin 81 MG tablet Take 81  mg by mouth daily.      Marland Kitchen b complex vitamins capsule Take 1 capsule by mouth daily.        . cholecalciferol (VITAMIN D) 1000 UNITS tablet Take 2,000 Units by mouth every other day.        . losartan (COZAAR) 25 MG tablet Take 25 mg by mouth daily.      . Multiple Vitamins-Minerals (MULTIVITAMINS THER. W/MINERALS) TABS Take 1 tablet by mouth daily.        Marland Kitchen OVER THE COUNTER MEDICATION Apply 1 application topically at bedtime as needed. Phys cream. For neuropathy pain      . Tamsulosin HCl (FLOMAX) 0.4 MG CAPS Take 0.4 mg by mouth daily.        . enalapril (VASOTEC) 5 MG tablet Take 5 mg by mouth daily.        . Probiotic Product (PROBIOTIC FORMULA PO) Take 1 capsule by mouth daily.          Review of Systems Review of Systems  Constitutional: Negative for fever, chills, appetite change and unexpected weight change.  HENT: Negative for congestion and trouble swallowing.   Eyes: Negative for visual disturbance.  Respiratory: Negative  for chest tightness and shortness of breath.   Cardiovascular: Negative for chest pain and leg swelling.       No PND, no orthopnea, no DOE  Gastrointestinal:       See HPI  Genitourinary: Negative for dysuria and hematuria.  Musculoskeletal: Negative.        Uses walker, balance issues; decrease use of b/l hands  Skin: Negative for rash.  Neurological: Negative for seizures and speech difficulty.       Peripheral neuropathy; denies TIAs, amaurosis fugax  Hematological: Does not bruise/bleed easily.  Psychiatric/Behavioral: Negative for behavioral problems and confusion.    Blood pressure 140/84, pulse 60, temperature 97 F (36.1 C), temperature source Oral, resp. rate 18, height 5\' 10"  (1.778 m), weight 160 lb (72.576 kg).  Physical Exam Physical Exam  Vitals reviewed. Constitutional: He is oriented to person, place, and time. He appears well-developed and well-nourished. No distress.       Uses a rolling walker  HENT:  Head: Normocephalic and  atraumatic.  Right Ear: External ear normal.  Eyes: Conjunctivae normal are normal. No scleral icterus.  Neck: Neck supple. No tracheal deviation present. No thyromegaly present.  Cardiovascular: Normal rate, regular rhythm, normal heart sounds and intact distal pulses.   Pulmonary/Chest: Effort normal and breath sounds normal. No stridor. No respiratory distress. He has no wheezes.  Abdominal: Soft. Bowel sounds are normal. He exhibits no distension. There is no tenderness.  Genitourinary: Penis normal.          B/l atrophic testicles  Musculoskeletal: He exhibits no edema and no tenderness.       Unsteady balance  Neurological: He is alert and oriented to person, place, and time.  Skin: Skin is warm and dry. No rash noted. He is not diaphoretic. No erythema.  Psychiatric: He has a normal mood and affect. His behavior is normal. Judgment and thought content normal.    Data Reviewed Dr Rolly Salter notes from July and Oct 2013  Assessment    Recurrent Left inguinal hernia    Plan    We discussed the etiology of inguinal hernias. We discussed the signs & symptoms of incarceration & strangulation.  We discussed non-operative and operative management. I recommended surgery since he is symptomatic and I recommended a laparoscopic approach since it is recurrent  The patient has elected to proceed with a [place]   I described the procedure in detail.  The patient was given educational material. We discussed the risks and benefits including but not limited to bleeding, infection, chronic inguinal pain, nerve entrapment, hernia recurrence, mesh complications, hematoma formation, urinary retention, injury to the testicles, numbness in the groin, blood clots, injury to the surrounding structures, conversion to open, and anesthesia risk. We also discussed the typical post operative recovery course, including no heavy lifting for 6 weeks. I  explained that the likelihood of improvement of their symptoms is good.    Drew Gibson. Drew Pulling, MD, FACS General, Bariatric, & Minimally Invasive Surgery [facility], [place]        [facility] M 08/21/2012, 9:51 AM

## 2012-08-21 NOTE — Patient Instructions (Signed)
Hernia Repair with Laparoscope A hernia occurs when an internal organ pushes out through a weak spot in the belly (abdominal) wall muscles. Hernias most commonly occur in the groin and around the navel. Hernias can also occur through a cut by the surgeon (incision) after an abdominal operation. A hernia may be caused by:  Lifting heavy objects.  Prolonged coughing.  Straining to move your bowels. Hernias can often be pushed back into place (reduced). Most hernias tend to get worse over time. Problems occur when abdominal contents get stuck in the opening and the blood supply is blocked or impaired (incarcerated hernia). Because of these risks, you require surgery to repair the hernia. Your hernia will be repaired using a laparoscope. Laparoscopic surgery is a type of minimally invasive surgery. It does not involve making a typical surgical cut (incision) in the skin. A laparoscope is a telescope-like rod and lens system. It is usually connected to a video camera and a light source so your caregiver can clearly see the operative area. The instruments are inserted through  to  inch (5 mm or 10 mm) openings in the skin at specific locations. A working and viewing space is created by blowing a small amount of carbon dioxide gas into the abdominal cavity. The abdomen is essentially blown up like a balloon (insufflated). This elevates the abdominal wall above the internal organs like a dome. The carbon dioxide gas is common to the human body and can be absorbed by tissue and removed by the respiratory system. Once the repair is completed, the small incisions will be closed with either stitches (sutures) or staples (just like a paper stapler only this staple holds the skin together). LET YOUR CAREGIVERS KNOW ABOUT:  Allergies.  Medications taken including herbs, eye drops, over the counter medications, and creams.  Use of steroids (by mouth or creams).  Previous problems with anesthetics or  Novocaine.  Possibility of pregnancy, if this applies.  History of blood clots (thrombophlebitis).  History of bleeding or blood problems.  Previous surgery.  Other health problems. BEFORE THE PROCEDURE  Laparoscopy can be done either in a hospital or out-patient clinic. You may be given a mild sedative to help you relax before the procedure. Once in the operating room, you will be given a general anesthesia to make you sleep (unless you and your caregiver choose a different anesthetic).  AFTER THE PROCEDURE  After the procedure you will be watched in a recovery area. Depending on what type of hernia was repaired, you might be admitted to the hospital or you might go home the same day. With this procedure you may have less pain and scarring. This usually results in a quicker recovery and less risk of infection. HOME CARE INSTRUCTIONS   Bed rest is not required. You may continue your normal activities but avoid heavy lifting (more than 10 pounds) or straining.  Cough gently. If you are a smoker it is best to stop, as even the best hernia repair can break down with the continual strain of coughing.  Avoid driving until given the OK by your surgeon.  There are no dietary restrictions unless given otherwise.  TAKE ALL MEDICATIONS AS DIRECTED.  Only take over-the-counter or prescription medicines for pain, discomfort, or fever as directed by your caregiver. SEEK MEDICAL CARE IF:   There is increasing abdominal pain or pain in your incisions.  There is more bleeding from incisions, other than minimal spotting.  You feel light headed or faint.  You   develop an unexplained fever, chills, and/or an oral temperature above 102 F (38.9 C).  You have redness, swelling, or increasing pain in the wound.  Pus coming from wound.  A foul smell coming from the wound or dressings. SEEK IMMEDIATE MEDICAL CARE IF:   You develop a rash.  You have difficulty breathing.  You have any  allergic problems. MAKE SURE YOU:   Understand these instructions.  Will watch your condition.  Will get help right away if you are not doing well or get worse. Document Released: 09/17/2005 Document Revised: 12/10/2011 Document Reviewed: 08/17/2009 [facility] Patient Information 2013 [place].

## 2012-08-25 ENCOUNTER — Encounter (HOSPITAL_COMMUNITY): Payer: Self-pay | Admitting: Pharmacy Technician

## 2012-09-04 ENCOUNTER — Other Ambulatory Visit (HOSPITAL_COMMUNITY): Payer: Medicare Other

## 2012-09-04 ENCOUNTER — Encounter (HOSPITAL_COMMUNITY)
Admission: RE | Admit: 2012-09-04 | Discharge: 2012-09-04 | Disposition: A | Discharge status: Home / Self Care | Payer: Medicare Other | Source: Ambulatory Visit | Attending: General Surgery | Admitting: General Surgery

## 2012-09-04 ENCOUNTER — Encounter (HOSPITAL_COMMUNITY): Payer: Self-pay

## 2012-09-04 HISTORY — DX: Unspecified macular degeneration: H35.30

## 2012-09-04 HISTORY — DX: Urgency of urination: R39.15

## 2012-09-04 HISTORY — DX: Frequency of micturition: R35.0

## 2012-09-04 HISTORY — DX: Foot drop, right foot: M21.371

## 2012-09-04 HISTORY — DX: Transient cerebral ischemic attack, unspecified: G45.9

## 2012-09-04 HISTORY — DX: Hyperlipidemia, unspecified: E78.5

## 2012-09-04 HISTORY — DX: Foot drop, right foot: M21.372

## 2012-09-04 HISTORY — DX: Pneumonia, unspecified organism: J18.9

## 2012-09-04 LAB — CBC WITH DIFFERENTIAL/PLATELET
Basophils Relative: 0 % (ref 0–1)
Eosinophils Absolute: 0.3 10*3/uL (ref 0.0–0.7)
Lymphs Abs: 2.9 10*3/uL (ref 0.7–4.0)
MCH: 32.5 pg (ref 26.0–34.0)
MCHC: 32.7 g/dL (ref 30.0–36.0)
Neutro Abs: 4.1 10*3/uL (ref 1.7–7.7)
Neutrophils Relative %: 53 % (ref 43–77)
Platelets: 180 10*3/uL (ref 150–400)
RBC: 3.94 MIL/uL — ABNORMAL LOW (ref 4.22–5.81)

## 2012-09-04 LAB — SURGICAL PCR SCREEN
MRSA, PCR: NEGATIVE
Staphylococcus aureus: NEGATIVE

## 2012-09-04 LAB — BASIC METABOLIC PANEL
Calcium: 9.4 mg/dL (ref 8.4–10.5)
GFR calc Af Amer: 44 mL/min — ABNORMAL LOW (ref >90)
GFR calc non Af Amer: 38 mL/min — ABNORMAL LOW (ref >90)
Glucose, Bld: 92 mg/dL (ref 70–99)
Sodium: 139 mEq/L (ref 135–145)

## 2012-09-04 MED ORDER — CEFAZOLIN SODIUM-DEXTROSE 2-3 GM-% IV SOLR
2.0000 g | INTRAVENOUS | Status: AC
  Administered 2012-09-05: 2 g via INTRAVENOUS

## 2012-09-04 MED ORDER — CHLORHEXIDINE GLUCONATE 4 % EX LIQD: 1.0000 "application " | Freq: Once | CUTANEOUS | Status: DC

## 2012-09-04 NOTE — Consult Note (Signed)
Anesthesia chart review: Patient is an 78 year old male scheduled for laparoscopic repair of recurrent left inguinal hernia with mesh on 09/05/2012 by Dr. Redmond Pulling. History includes former smoker, BPH, macular degeneration, gout, hyperlipidemia, peripheral neuropathy, TIA, hypertension, bilateral foot drop, left tibial fracture repair '11. PCP is Dr. Jeanmarie Hubert.  EKG from Dr. Rolly Salter office is still pending.  (EKG showed NSR on 07/10/10.)  Echo on 08/06/2011 showed: - Left ventricle: The cavity size was normal. There was mild focal basal hypertrophy of the septum. Systolic function was normal. The estimated ejection fraction was in the range of 55% to 60%. Wall motion was normal; there were no regional wall motion abnormalities. - Aortic valve: Moderate focal thickening and calcification.  Chest x-ray on 09/04/2012 showed no worrisome focal or acute cardiopulmonary abnormalities.  Preoperative labs noted. BUN 38, creatinine 1.64.  Current comparison labs show his creatinine ranging from 1.19-1.9 since October 2011.  He will need an EKG on the day of surgery if we have not received an EKG within the past year.  If no worrisome findings, then anticipate he can proceed as planned.  Myra Gianotti, PA-C 09/04/12 1545

## 2012-09-04 NOTE — Progress Notes (Addendum)
Pt doesn't have a cardiologist  Dr.Arthur Nyoka Cowden with [facility] test done >20 yrs ago Echo in epic from 08/06/11 heart cath  EKG to be requested from [facility]  Denies cxr being done within the past yr

## 2012-09-04 NOTE — Pre-Procedure Instructions (Signed)
[place]  09/04/2012   Your procedure is scheduled on:  Fri, Dec 6 @ 1:00 PM  Report to [facility] at 11:00 AM.  Call this number if you have problems the morning of surgery: [phone]   Remember:   Do not eat food:After Midnight.    Take these medicines the morning of surgery with A SIP OF WATER: Allopurinol(Zyloprim) and Flomax(Tamsulosin)   Do not wear jewelry  Do not wear lotions, powders, or colognes. You may wear deodorant.  Men may shave face and neck.  Do not bring valuables to the hospital.  Contacts, dentures or bridgework may not be worn into surgery.  Leave suitcase in the car. After surgery it may be brought to your room.  For patients admitted to the hospital, checkout time is 11:00 AM the day of discharge.   Patients discharged the day of surgery will not be allowed to drive home.    Special Instructions: Shower using CHG 2 nights before surgery and the night before surgery.  If you shower the day of surgery use CHG.  Use special wash - you have one bottle of CHG for all showers.  You should use approximately 1/3 of the bottle for each shower.   Please read over the following fact sheets that you were given: Pain Booklet, Coughing and Deep Breathing, MRSA Information and Surgical Site Infection Prevention

## 2012-09-05 ENCOUNTER — Ambulatory Visit (HOSPITAL_COMMUNITY): Payer: Medicare Other | Admitting: Vascular Surgery

## 2012-09-05 ENCOUNTER — Ambulatory Visit (HOSPITAL_COMMUNITY)
Admission: RE | Admit: 2012-09-05 | Discharge: 2012-09-07 | Disposition: A | Discharge status: Home / Self Care | Payer: Medicare Other | Source: Ambulatory Visit | Attending: General Surgery | Admitting: General Surgery

## 2012-09-05 ENCOUNTER — Encounter (HOSPITAL_COMMUNITY): Payer: Self-pay | Admitting: Vascular Surgery

## 2012-09-05 ENCOUNTER — Encounter (HOSPITAL_COMMUNITY): Payer: Self-pay | Admitting: *Deleted

## 2012-09-05 ENCOUNTER — Encounter (HOSPITAL_COMMUNITY)
Admission: RE | Disposition: A | Discharge status: Home / Self Care | Payer: Self-pay | Source: Ambulatory Visit | Attending: General Surgery

## 2012-09-05 DIAGNOSIS — G589 Mononeuropathy, unspecified: Secondary | ICD-10-CM | POA: Insufficient documentation

## 2012-09-05 DIAGNOSIS — R262 Difficulty in walking, not elsewhere classified: Secondary | ICD-10-CM | POA: Insufficient documentation

## 2012-09-05 DIAGNOSIS — Y838 Other surgical procedures as the cause of abnormal reaction of the patient, or of later complication, without mention of misadventure at the time of the procedure: Secondary | ICD-10-CM | POA: Insufficient documentation

## 2012-09-05 DIAGNOSIS — G629 Polyneuropathy, unspecified: Secondary | ICD-10-CM | POA: Diagnosis present

## 2012-09-05 DIAGNOSIS — K4091 Unilateral inguinal hernia, without obstruction or gangrene, recurrent: Secondary | ICD-10-CM

## 2012-09-05 DIAGNOSIS — Z01812 Encounter for preprocedural laboratory examination: Secondary | ICD-10-CM | POA: Insufficient documentation

## 2012-09-05 DIAGNOSIS — Y9210 Unspecified residential institution as the place of occurrence of the external cause: Secondary | ICD-10-CM | POA: Insufficient documentation

## 2012-09-05 DIAGNOSIS — I10 Essential (primary) hypertension: Secondary | ICD-10-CM | POA: Insufficient documentation

## 2012-09-05 DIAGNOSIS — IMO0002 Reserved for concepts with insufficient information to code with codable children: Secondary | ICD-10-CM | POA: Insufficient documentation

## 2012-09-05 DIAGNOSIS — R339 Retention of urine, unspecified: Secondary | ICD-10-CM | POA: Insufficient documentation

## 2012-09-05 DIAGNOSIS — N9989 Other postprocedural complications and disorders of genitourinary system: Secondary | ICD-10-CM | POA: Insufficient documentation

## 2012-09-05 DIAGNOSIS — Z01818 Encounter for other preprocedural examination: Secondary | ICD-10-CM | POA: Insufficient documentation

## 2012-09-05 DIAGNOSIS — N400 Enlarged prostate without lower urinary tract symptoms: Secondary | ICD-10-CM | POA: Insufficient documentation

## 2012-09-05 HISTORY — PX: INGUINAL HERNIA REPAIR: SHX194

## 2012-09-05 HISTORY — PX: INSERTION OF MESH: SHX5868

## 2012-09-05 SURGERY — REPAIR, HERNIA, INGUINAL, LAPAROSCOPIC
Anesthesia: General | Site: Groin | Laterality: Left | Wound class: Clean

## 2012-09-05 MED ORDER — ENALAPRIL MALEATE 5 MG PO TABS
5.0000 mg | ORAL_TABLET | Freq: Every day | ORAL | Status: DC
  Administered 2012-09-05 – 2012-09-06 (×2): 5 mg via ORAL
  Filled 2012-09-05 (×3): qty 1

## 2012-09-05 MED ORDER — ROCURONIUM BROMIDE 100 MG/10ML IV SOLN
INTRAVENOUS | Status: DC | PRN
  Administered 2012-09-05: 40 mg via INTRAVENOUS
  Administered 2012-09-05: 10 mg via INTRAVENOUS

## 2012-09-05 MED ORDER — MORPHINE SULFATE 2 MG/ML IJ SOLN: 1.0000 mg | INTRAMUSCULAR | Status: DC | PRN

## 2012-09-05 MED ORDER — SODIUM CHLORIDE 0.9 % IR SOLN
Status: DC | PRN
  Administered 2012-09-05: 1000 mL

## 2012-09-05 MED ORDER — SODIUM CHLORIDE 0.9 % IV SOLN
INTRAVENOUS | Status: DC
  Administered 2012-09-06: 04:00:00 via INTRAVENOUS

## 2012-09-05 MED ORDER — SODIUM CHLORIDE 0.9 % IV SOLN
INTRAVENOUS | Status: DC | PRN
  Administered 2012-09-05: 15:00:00 via INTRAVENOUS

## 2012-09-05 MED ORDER — ONDANSETRON HCL 4 MG PO TABS: 4.0000 mg | ORAL_TABLET | Freq: Four times a day (QID) | ORAL | Status: DC | PRN

## 2012-09-05 MED ORDER — 0.9 % SODIUM CHLORIDE (POUR BTL) OPTIME
TOPICAL | Status: DC | PRN
  Administered 2012-09-05: 1000 mL

## 2012-09-05 MED ORDER — BUPIVACAINE-EPINEPHRINE PF 0.25-1:200000 % IJ SOLN
INTRAMUSCULAR | Status: AC
  Filled 2012-09-05: qty 30

## 2012-09-05 MED ORDER — LACTATED RINGERS IV SOLN
INTRAVENOUS | Status: DC
  Administered 2012-09-05: 12:00:00 via INTRAVENOUS

## 2012-09-05 MED ORDER — HYDROMORPHONE HCL PF 1 MG/ML IJ SOLN
0.2500 mg | INTRAMUSCULAR | Status: DC | PRN
  Administered 2012-09-05 (×2): 0.5 mg via INTRAVENOUS

## 2012-09-05 MED ORDER — FENTANYL CITRATE 0.05 MG/ML IJ SOLN
INTRAMUSCULAR | Status: DC | PRN
  Administered 2012-09-05: 50 ug via INTRAVENOUS
  Administered 2012-09-05 (×2): 100 ug via INTRAVENOUS
  Administered 2012-09-05 (×2): 50 ug via INTRAVENOUS

## 2012-09-05 MED ORDER — HYDROMORPHONE HCL PF 1 MG/ML IJ SOLN
INTRAMUSCULAR | Status: AC
  Filled 2012-09-05: qty 1

## 2012-09-05 MED ORDER — ACETAMINOPHEN 10 MG/ML IV SOLN
INTRAVENOUS | Status: AC
  Filled 2012-09-05: qty 100

## 2012-09-05 MED ORDER — CEFAZOLIN SODIUM-DEXTROSE 2-3 GM-% IV SOLR
INTRAVENOUS | Status: AC
  Filled 2012-09-05: qty 50

## 2012-09-05 MED ORDER — NEOSTIGMINE METHYLSULFATE 1 MG/ML IJ SOLN
INTRAMUSCULAR | Status: DC | PRN
  Administered 2012-09-05: 5 mg via INTRAVENOUS

## 2012-09-05 MED ORDER — ACETAMINOPHEN 10 MG/ML IV SOLN
1000.0000 mg | Freq: Once | INTRAVENOUS | Status: AC
  Administered 2012-09-05: 1000 mg via INTRAVENOUS
  Filled 2012-09-05: qty 100

## 2012-09-05 MED ORDER — LOSARTAN POTASSIUM 25 MG PO TABS
25.0000 mg | ORAL_TABLET | Freq: Every day | ORAL | Status: DC
  Administered 2012-09-06: 25 mg via ORAL
  Filled 2012-09-05 (×3): qty 1

## 2012-09-05 MED ORDER — ONDANSETRON HCL 4 MG/2ML IJ SOLN: 4.0000 mg | Freq: Once | INTRAMUSCULAR | Status: DC | PRN

## 2012-09-05 MED ORDER — ACETAMINOPHEN 10 MG/ML IV SOLN: 1000.0000 mg | Freq: Once | INTRAVENOUS | Status: DC | PRN

## 2012-09-05 MED ORDER — GLYCOPYRROLATE 0.2 MG/ML IJ SOLN
INTRAMUSCULAR | Status: DC | PRN
  Administered 2012-09-05: 0.6 mg via INTRAVENOUS

## 2012-09-05 MED ORDER — TAMSULOSIN HCL 0.4 MG PO CAPS
0.4000 mg | ORAL_CAPSULE | Freq: Every day | ORAL | Status: DC
  Administered 2012-09-06: 0.4 mg via ORAL
  Filled 2012-09-05 (×2): qty 1

## 2012-09-05 MED ORDER — BUPIVACAINE-EPINEPHRINE 0.25% -1:200000 IJ SOLN
INTRAMUSCULAR | Status: DC | PRN
  Administered 2012-09-05: 15 mL

## 2012-09-05 MED ORDER — LIDOCAINE HCL (CARDIAC) 20 MG/ML IV SOLN
INTRAVENOUS | Status: DC | PRN
  Administered 2012-09-05: 40 mg via INTRAVENOUS

## 2012-09-05 MED ORDER — ARTIFICIAL TEARS OP OINT
TOPICAL_OINTMENT | OPHTHALMIC | Status: DC | PRN
  Administered 2012-09-05: 1 via OPHTHALMIC

## 2012-09-05 MED ORDER — LACTATED RINGERS IV SOLN
INTRAVENOUS | Status: DC | PRN
  Administered 2012-09-05: 12:00:00 via INTRAVENOUS

## 2012-09-05 MED ORDER — VECURONIUM BROMIDE 10 MG IV SOLR
INTRAVENOUS | Status: DC | PRN
  Administered 2012-09-05 (×2): 1 mg via INTRAVENOUS

## 2012-09-05 MED ORDER — PROPOFOL 10 MG/ML IV BOLUS
INTRAVENOUS | Status: DC | PRN
  Administered 2012-09-05: 160 mg via INTRAVENOUS

## 2012-09-05 MED ORDER — ONDANSETRON HCL 4 MG/2ML IJ SOLN
INTRAMUSCULAR | Status: DC | PRN
  Administered 2012-09-05: 4 mg via INTRAVENOUS

## 2012-09-05 MED ORDER — ONDANSETRON HCL 4 MG/2ML IJ SOLN: 4.0000 mg | Freq: Four times a day (QID) | INTRAMUSCULAR | Status: DC | PRN

## 2012-09-05 MED ORDER — OXYCODONE-ACETAMINOPHEN 5-325 MG PO TABS
1.0000 | ORAL_TABLET | Freq: Four times a day (QID) | ORAL | Status: DC | PRN
  Administered 2012-09-06 (×3): 1 via ORAL
  Administered 2012-09-07: 2 via ORAL
  Filled 2012-09-05: qty 2
  Filled 2012-09-05 (×3): qty 1

## 2012-09-05 MED ORDER — ALLOPURINOL 100 MG PO TABS
100.0000 mg | ORAL_TABLET | Freq: Two times a day (BID) | ORAL | Status: DC
  Administered 2012-09-05 – 2012-09-06 (×3): 100 mg via ORAL
  Filled 2012-09-05 (×5): qty 1

## 2012-09-05 SURGICAL SUPPLY — 57 items
ADH SKN CLS APL DERMABOND .7 (GAUZE/BANDAGES/DRESSINGS)
ADH SKN CLS LQ APL DERMABOND (GAUZE/BANDAGES/DRESSINGS) ×1
APL SKNCLS STERI-STRIP NONHPOA (GAUZE/BANDAGES/DRESSINGS)
APPLICATOR COTTON TIP 6IN STRL (MISCELLANEOUS) ×3 IMPLANT
APPLIER CLIP 5 13 M/L LIGAMAX5 (MISCELLANEOUS)
APPLIER CLIP ROT 10 11.4 M/L (STAPLE)
APR CLP MED LRG 11.4X10 (STAPLE)
APR CLP MED LRG 5 ANG JAW (MISCELLANEOUS)
BENZOIN TINCTURE PRP APPL 2/3 (GAUZE/BANDAGES/DRESSINGS) IMPLANT
BLADE SURG ROTATE 9660 (MISCELLANEOUS) ×1 IMPLANT
CANISTER SUCTION 2500CC (MISCELLANEOUS) IMPLANT
CHLORAPREP W/TINT 26ML (MISCELLANEOUS) ×2 IMPLANT
CLIP APPLIE 5 13 M/L LIGAMAX5 (MISCELLANEOUS) IMPLANT
CLIP APPLIE ROT 10 11.4 M/L (STAPLE) IMPLANT
CLOTH BEACON ORANGE TIMEOUT ST (SAFETY) ×2 IMPLANT
COVER SURGICAL LIGHT HANDLE (MISCELLANEOUS) ×2 IMPLANT
DECANTER SPIKE VIAL GLASS SM (MISCELLANEOUS) ×2 IMPLANT
DERMABOND ADHESIVE PROPEN (GAUZE/BANDAGES/DRESSINGS) ×1
DERMABOND ADVANCED (GAUZE/BANDAGES/DRESSINGS)
DERMABOND ADVANCED .7 DNX12 (GAUZE/BANDAGES/DRESSINGS) IMPLANT
DERMABOND ADVANCED .7 DNX6 (GAUZE/BANDAGES/DRESSINGS) IMPLANT
DEVICE SECURE STRAP 25 ABSORB (INSTRUMENTS) ×2 IMPLANT
DRAPE INCISE IOBAN 66X45 STRL (DRAPES) ×1 IMPLANT
DRAPE UTILITY 15X26 W/TAPE STR (DRAPE) ×4 IMPLANT
DRSG TEGADERM 4X4.75 (GAUZE/BANDAGES/DRESSINGS) IMPLANT
ELECT REM PT RETURN 9FT ADLT (ELECTROSURGICAL) ×2
ELECTRODE REM PT RTRN 9FT ADLT (ELECTROSURGICAL) ×1 IMPLANT
GAUZE SPONGE 2X2 8PLY STRL LF (GAUZE/BANDAGES/DRESSINGS) IMPLANT
GLOVE BIO SURGEON STRL SZ7.5 (GLOVE) ×2 IMPLANT
GLOVE BIOGEL M STRL SZ7.5 (GLOVE) ×2 IMPLANT
GLOVE BIOGEL PI IND STRL 7.0 (GLOVE) IMPLANT
GLOVE BIOGEL PI IND STRL 7.5 (GLOVE) IMPLANT
GLOVE BIOGEL PI IND STRL 8 (GLOVE) ×1 IMPLANT
GLOVE BIOGEL PI INDICATOR 7.0 (GLOVE) ×2
GLOVE BIOGEL PI INDICATOR 7.5 (GLOVE) ×2
GLOVE BIOGEL PI INDICATOR 8 (GLOVE) ×1
GLOVE ECLIPSE 6.5 STRL STRAW (GLOVE) ×1 IMPLANT
GLOVE SURG SS PI 7.0 STRL IVOR (GLOVE) ×1 IMPLANT
GOWN PREVENTION PLUS XLARGE (GOWN DISPOSABLE) ×1 IMPLANT
GOWN PREVENTION PLUS XXLARGE (GOWN DISPOSABLE) ×2 IMPLANT
GOWN STRL NON-REIN LRG LVL3 (GOWN DISPOSABLE) ×5 IMPLANT
KIT BASIN OR (CUSTOM PROCEDURE TRAY) ×2 IMPLANT
KIT ROOM TURNOVER OR (KITS) ×2 IMPLANT
MESH ULTRAPRO 3X6 7.6X15CM (Mesh General) ×1 IMPLANT
NS IRRIG 1000ML POUR BTL (IV SOLUTION) ×2 IMPLANT
PAD ARMBOARD 7.5X6 YLW CONV (MISCELLANEOUS) ×4 IMPLANT
SCISSORS LAP 5X35 DISP (ENDOMECHANICALS) IMPLANT
SET IRRIG TUBING LAPAROSCOPIC (IRRIGATION / IRRIGATOR) ×1 IMPLANT
SPONGE GAUZE 2X2 STER 10/PKG (GAUZE/BANDAGES/DRESSINGS)
SUT MNCRL AB 4-0 PS2 18 (SUTURE) ×2 IMPLANT
TOWEL OR 17X24 6PK STRL BLUE (TOWEL DISPOSABLE) ×2 IMPLANT
TOWEL OR 17X26 10 PK STRL BLUE (TOWEL DISPOSABLE) ×2 IMPLANT
TRAY FOLEY CATH 14FR (SET/KITS/TRAYS/PACK) IMPLANT
TRAY LAPAROSCOPIC (CUSTOM PROCEDURE TRAY) ×2 IMPLANT
TROCAR XCEL BLADELESS 5X75MML (TROCAR) ×4 IMPLANT
TROCAR XCEL BLUNT TIP 100MML (ENDOMECHANICALS) ×2 IMPLANT
WATER STERILE IRR 1000ML POUR (IV SOLUTION) IMPLANT

## 2012-09-05 NOTE — Interval H&P Note (Signed)
History and Physical Interval Note:  09/05/2012 12:41 PM  Drew Gibson  has presented today for surgery, with the diagnosis of recurrent left inguinal hernia  The various methods of treatment have been discussed with the patient and family. After consideration of risks, benefits and other options for treatment, the patient has consented to  Procedure(s) (LRB) with comments: LAPAROSCOPIC INGUINAL HERNIA (Left) INSERTION OF MESH (Left) as a surgical intervention .  The patient's history has been reviewed, patient examined, no change in status, stable for surgery.  I have reviewed the patient's chart and labs.  Questions were answered to the patient's satisfaction.    Leighton Ruff. Redmond Pulling, MD, [place], Bariatric, & Minimally Invasive Surgery [facility], [place]   [facility] M

## 2012-09-05 NOTE — Transfer of Care (Signed)
Immediate Anesthesia Transfer of Care Note  Patient: Drew Gibson  Procedure(s) Performed: Procedure(s) (LRB) with comments: LAPAROSCOPIC INGUINAL HERNIA (Left) INSERTION OF MESH (Left)  Patient Location: PACU  Anesthesia Type:General  Level of Consciousness: awake, alert , oriented and patient cooperative  Airway & Oxygen Therapy: Patient Spontanous Breathing and Patient connected to nasal cannula oxygen  Post-op Assessment: Report given to PACU RN and Post -op Vital signs reviewed and stable  Post vital signs: Reviewed and stable  Complications: No apparent anesthesia complications

## 2012-09-05 NOTE — Anesthesia Postprocedure Evaluation (Signed)
Anesthesia Post Note  Patient: Drew Gibson  Procedure(s) Performed: Procedure(s) (LRB): LAPAROSCOPIC INGUINAL HERNIA (Left) INSERTION OF MESH (Left)  Anesthesia type: general  Patient location: PACU  Post pain: Pain level controlled  Post assessment: Patient's Cardiovascular Status Stable  Last Vitals:  Filed Vitals:   09/05/12 1545  BP:   Pulse: 68  Temp:   Resp: 12    Post vital signs: Reviewed and stable  Level of consciousness: sedated  Complications: No apparent anesthesia complications

## 2012-09-05 NOTE — Anesthesia Preprocedure Evaluation (Addendum)
Anesthesia Evaluation  Patient identified by MRN, date of birth, ID band Patient awake    Reviewed: Allergy & Precautions, H&P , NPO status , Patient's Chart, lab work & pertinent test results  History of Anesthesia Complications Negative for: history of anesthetic complications  Airway Mallampati: II TM Distance: >3 FB Neck ROM: Full    Dental  (+) Edentulous Upper, Edentulous Lower and Dental Advisory Given   Pulmonary pneumonia - (November 2012), resolved,  breath sounds clear to auscultation        Cardiovascular hypertension, Pt. on medications Rhythm:Regular Rate:Normal     Neuro/Psych TIA's - no residual TIA Neuromuscular disease    GI/Hepatic GERD-  Controlled,  Endo/Other    Renal/GU      Musculoskeletal   Abdominal   Peds  Hematology   Anesthesia Other Findings   Reproductive/Obstetrics                          Anesthesia Physical Anesthesia Plan  ASA: III  Anesthesia Plan: General   Post-op Pain Management:    Induction: Intravenous  Airway Management Planned: Oral ETT  Additional Equipment:   Intra-op Plan:   Post-operative Plan: Extubation in OR  Informed Consent: I have reviewed the patients History and Physical, chart, labs and discussed the procedure including the risks, benefits and alternatives for the proposed anesthesia with the patient or authorized representative who has indicated his/her understanding and acceptance.     Plan Discussed with: CRNA and Surgeon  Anesthesia Plan Comments: (Htn Neuropathy H/O TIA Renal Insuff Cr 1.64  Plan GA with TAP  [name] Gaudy, MD)        Anesthesia Quick Evaluation

## 2012-09-05 NOTE — Preoperative (Signed)
Beta Blockers   Reason not to administer Beta Blockers:Not Applicable 

## 2012-09-05 NOTE — H&P (View-Only) (Signed)
Patient ID: Drew Gibson, male   DOB: 1928/10/30, 78 y.o.   MRN: [id number]  Chief Complaint  Patient presents with  . New Evaluation    hernia    HPI Drew Gibson is a 78 y.o. male.   HPI 78 yo WM referred by Dr Drew Gibson for evaluation of a left inguinal hernia. The patient had previously undergone an open left inguinal hernia repair with mesh in 1998 by Dr. Rosana Hoes. The patient was in his usual state of health until July when he noted some intermittent discomfort in his left groin. At that time his primary care physician did not notice a hernia on exam. However since the summer he has had worsening discomfort in his left groin.he complains of a sharp pain in his left groin. It is inferior to this incision. The pain is intermittent. However it is now almost occurring on a daily basis. Standing for prolonged period of time as well as bending over increases the pain and discomfort. If he has a bowel movement it decreases the discomfort. He states that he has noticed a bulge in his groin and it is always there. He denies any nausea, vomiting, diarrhea or constipation. He takes Flomax on a daily basis. He walks about 25 minutes a day in a park Past Medical History  Diagnosis Date  . Gout   . Hypertension   . Peripheral neuropathy   . Enlarged prostate     Past Surgical History  Procedure Date  . Hernia repair     Family History  Problem Relation Age of Onset  . Diabetes Mother     type 2  . Leukemia Father   . COPD Brother     Social History History  Substance Use Topics  . 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 Smokeless tobacco: Not on file     Comment: 25 yrs ago  . Alcohol Use: No    No Known Allergies  Current Outpatient Prescriptions  Medication Sig Dispense Refill  . acetaminophen (TYLENOL) 500 MG tablet Take 500 mg by mouth every 6 (six) hours as needed. For pain       . allopurinol (ZYLOPRIM) 100 MG tablet Take 100 mg by mouth daily.        Marland Kitchen aspirin 81 MG tablet Take 81  mg by mouth daily.      Marland Kitchen b complex vitamins capsule Take 1 capsule by mouth daily.        . cholecalciferol (VITAMIN D) 1000 UNITS tablet Take 2,000 Units by mouth every other day.        . losartan (COZAAR) 25 MG tablet Take 25 mg by mouth daily.      . Multiple Vitamins-Minerals (MULTIVITAMINS THER. W/MINERALS) TABS Take 1 tablet by mouth daily.        Marland Kitchen OVER THE COUNTER MEDICATION Apply 1 application topically at bedtime as needed. Phys cream. For neuropathy pain      . Tamsulosin HCl (FLOMAX) 0.4 MG CAPS Take 0.4 mg by mouth daily.        . enalapril (VASOTEC) 5 MG tablet Take 5 mg by mouth daily.        . Probiotic Product (PROBIOTIC FORMULA PO) Take 1 capsule by mouth daily.          Review of Systems Review of Systems  Constitutional: Negative for fever, chills, appetite change and unexpected weight change.  HENT: Negative for congestion and trouble swallowing.   Eyes: Negative for visual disturbance.  Respiratory: Negative  for chest tightness and shortness of breath.   Cardiovascular: Negative for chest pain and leg swelling.       No PND, no orthopnea, no DOE  Gastrointestinal:       See HPI  Genitourinary: Negative for dysuria and hematuria.  Musculoskeletal: Negative.        Uses walker, balance issues; decrease use of b/l hands  Skin: Negative for rash.  Neurological: Negative for seizures and speech difficulty.       Peripheral neuropathy; denies TIAs, amaurosis fugax  Hematological: Does not bruise/bleed easily.  Psychiatric/Behavioral: Negative for behavioral problems and confusion.    Blood pressure 140/84, pulse 60, temperature 97 F (36.1 C), temperature source Oral, resp. rate 18, height 5\' 10"  (1.778 m), weight 160 lb (72.576 kg).  Physical Exam Physical Exam  Vitals reviewed. Constitutional: He is oriented to person, place, and time. He appears well-developed and well-nourished. No distress.       Uses a rolling walker  HENT:  Head: Normocephalic and  atraumatic.  Right Ear: External ear normal.  Eyes: Conjunctivae normal are normal. No scleral icterus.  Neck: Neck supple. No tracheal deviation present. No thyromegaly present.  Cardiovascular: Normal rate, regular rhythm, normal heart sounds and intact distal pulses.   Pulmonary/Chest: Effort normal and breath sounds normal. No stridor. No respiratory distress. He has no wheezes.  Abdominal: Soft. Bowel sounds are normal. He exhibits no distension. There is no tenderness.  Genitourinary: Penis normal.          B/l atrophic testicles  Musculoskeletal: He exhibits no edema and no tenderness.       Unsteady balance  Neurological: He is alert and oriented to person, place, and time.  Skin: Skin is warm and dry. No rash noted. He is not diaphoretic. No erythema.  Psychiatric: He has a normal mood and affect. His behavior is normal. Judgment and thought content normal.    Data Reviewed Dr Rolly Salter notes from July and Oct 2013  Assessment    Recurrent Left inguinal hernia    Plan    We discussed the etiology of inguinal hernias. We discussed the signs & symptoms of incarceration & strangulation.  We discussed non-operative and operative management. I recommended surgery since he is symptomatic and I recommended a laparoscopic approach since it is recurrent  The patient has elected to proceed with a [place]   I described the procedure in detail.  The patient was given educational material. We discussed the risks and benefits including but not limited to bleeding, infection, chronic inguinal pain, nerve entrapment, hernia recurrence, mesh complications, hematoma formation, urinary retention, injury to the testicles, numbness in the groin, blood clots, injury to the surrounding structures, conversion to open, and anesthesia risk. We also discussed the typical post operative recovery course, including no heavy lifting for 6 weeks. I  explained that the likelihood of improvement of their symptoms is good.    Drew Ruff. Redmond Pulling, MD, FACS General, Bariatric, & Minimally Invasive Surgery [facility], [place]        [facility] M 08/21/2012, 9:51 AM

## 2012-09-05 NOTE — Op Note (Signed)
09/05/2012  Drew Gibson May 10, 1929   PREOPERATIVE DIAGNOSIS: recurrent left inguinal hernia.   POSTOPERATIVE DIAGNOSIS: recurrent left indirect inguinal hernia.   PROCEDURE: Laparoscopic repair of recurrent left indirect inguinal hernia with  mesh (TAPP).   SURGEON: Leighton Ruff. Redmond Pulling, MD   ASSISTANT SURGEON: None.   ANESTHESIA: General plus local consisting of 0.25% Marcaine with epi.plus TAP block to L side by anesthesia  ESTIMATED BLOOD LOSS: Minimal.   FINDINGS: The patient had a recurrent left indirect inguinal hernia. Part of the old mesh was removed since it was tethered and affixed to the testicular vessel.  It was repaired using a 3 inch x 6  inch piece of Ethicon UltraPro mesh.   SPECIMEN: old mesh - discarded  INDICATIONS FOR PROCEDURE: 78yo WM s/p open repair of left inguinal hernia in 1998 presented with a symptomatic recurrence on the left who desired repair.  The risks and benefits including but not limited to bleeding, infection, chronic inguinal pain, nerve entrapment, hernia recurrence, mesh complications, hematoma formation, urinary retention, injury to the testicles or the ovaries, numbness in the groin, blood clots, injury to the surrounding structures, and anesthesia risk was discussed with the patient.  DESCRIPTION OF PROCEDURE: The patient received a left TAP block by anesthesia preoperatively. After obtaining verbal consent and marking  the left groin in the holding area with the patient confirming the  operative site, the patient was then taken back to the operating room, placed  supine on the operating room table. General endotracheal anesthesia was  established. The patient had emptied their bladder prior to going back to  the operating room. Sequential compression devices were placed. The  abdomen and groin were prepped and draped in the usual standard surgical  fashion with ChloraPrep. The patient received IV Tylenol as well as IV  antibiotics prior  to the incision. A surgical time-out was performed.  Local was infiltrated at the base of the umbilicus.   Next, a 1-cm vertical infraumbilical incision was made with a #11 blade. The fascia  was grasped and lifted anteriorly. Next, the fascia was incised, and  the abdominal cavity was entered. Pursestring suture was placed around  the fascial edges using a 0 Vicryl. A 12-mm Hasson trocar was placed.  Pneumoperitoneum was smoothly established up to a patient pressure of 15  mmHg. Laparoscope was advanced. There was no evidence of a  contralateral hernia. The patient had a defect lateral to  the inferior epigastric vessel, consistent with an recurrent left indirect  hernia. Two 5-mm trocars were placed, one on the right, one on the left  in the midclavicular line slightly above the level of the umbilicus all  under direct visualization. After local had been infiltrated for only the right trocar, I then  made incision along the peritoneum on the left, starting 2 inches above  the anterior superior iliac spine and caring it medial  toward the median umbilical ligament in a lazy S configuration using  Endo Shears with electrocautery. The peritoneal flap was then gently  dissected downward from the anterior abdominal wall taking care not to  injure the inferior epigastric vessels. The pubic bone was identified.  The testicular vessels were identified.  Using  traction and counter traction with short graspers, I reduced the sac in  its entirety. The testicular vessels were affixed to a lobular firm structure covered in some fat which appeared to be old rolled up mesh. This old mesh was stripped away from the vessels. There was  some bleeding but hemostasis was achieved.  The vas deferens was identified and preserved, and the hernia sac was stripped from those to  surrounding structures. I then went about creating a large pocket by  lifting the peritoneum of the pelvic floor. I took great care not to   injure the iliac vessels. I then obtained a piece of Ethicon UltraPro mesh 3 inch x  6 inch, placed it through the Hasson trocar, half of it covered medial  to the inferior epigastric vessels and half of it lateral to the  inferior epigastric vessels. The defect was well  covered with the mesh. I then secured the mesh to the abdominal wall  using an Ethicon secure strap tack. Tacks were placed through  the Cooper's ligament, one tack on each side of the inferior epigastric  vessel and two tacks out laterally. No tacks were placed below the  shelving edge of the inguinal ligament. Pneumoperitoneum was reduced  to 8 mmHg. I then brought the peritoneal flap back up to the abdominal  wall and tacked it to the abdominal wall using 4 tacks. There was no  defect in the peritoneum, and the mesh was well covered. I removed the  Hasson trocar and tied down the previously placed pursestring suture.  The closure was viewed laparoscopically. There was no evidence of  fascial defect. There was no air leak at the umbilicus. There was no  evidence of injury to surrounding structures. Pneumoperitoneum was  released, and the remaining trocars were removed. The right sided trocar incision keep bleeding despite pressure. Electrocautery was used but it still kept oozing.  A 3-0 vicryl suture was placed deeply and hemostasis was achieved. All skin incisions except for the right sided trocar site were closed with a 4-0 Monocryl in a subcuticular fashion followed by  application of Dermabond. All needle, instrument, and sponge counts  were correct x2. There are no immediate complications. The patient  tolerated the procedure well. The patient was extubated and taken to the  recovery room in stable condition.  Leighton Ruff. Redmond Pulling, MD, FACS General, Bariatric, & Minimally Invasive Surgery [facility], [place]

## 2012-09-05 NOTE — Anesthesia Procedure Notes (Addendum)
Procedure Name: Intubation Date/Time: 09/05/2012 1:23 PM Performed by: Jenne Campus Pre-anesthesia Checklist: Patient identified, Emergency Drugs available, Suction available, Patient being monitored and Timeout performed Patient Re-evaluated:Patient Re-evaluated prior to inductionOxygen Delivery Method: Circle system utilized Preoxygenation: Pre-oxygenation with 100% oxygen Intubation Type: IV induction Ventilation: Mask ventilation without difficulty and Oral airway inserted - appropriate to patient size Laryngoscope Size: Miller and 2 Grade View: Grade I Tube type: Oral Tube size: 7.5 mm Number of attempts: 1 Airway Equipment and Method: Stylet Placement Confirmation: ETT inserted through vocal cords under direct vision,  positive ETCO2 and breath sounds checked- equal and bilateral Secured at: 23 cm Tube secured with: Tape Dental Injury: Teeth and Oropharynx as per pre-operative assessment     Anesthesia Regional Block:  TAP block  Pre-Anesthetic Checklist: ,, timeout performed, Correct Patient, Correct Site, Correct Laterality, Correct Procedure, Correct Position, site marked, Risks and benefits discussed,  Surgical consent,  Pre-op evaluation,  At surgeon's request and post-op pain management  Laterality: Left  Prep: chloraprep       Needles:  Injection technique: Single-shot  Needle Type: Echogenic Stimulator Needle     Needle Length:cm 9 cm Needle Gauge: 22 and 22 G    Additional Needles:  Procedures: ultrasound guided (picture in chart) TAP block Narrative:  Start time: 09/05/2012 1:30 PM End time: 09/05/2012 1:41 PM Injection made incrementally with aspirations every 5 mL.  Performed by: Personally   Additional Notes: 25 cc 0.5% marcaine with 1:200 Epi injected easily  [name] Gaudy, MD

## 2012-09-05 NOTE — Progress Notes (Signed)
Received report from [facility]

## 2012-09-06 NOTE — Progress Notes (Signed)
1 Day Post-Op   Assessment: s/p Procedure(s): LAPAROSCOPIC INGUINAL HERNIA INSERTION OF MESH Patient Active Problem List  Diagnosis  . CAP (community acquired pneumonia)  . Dehydration  . Hypotension  . Hypoxia  . BPH (benign prostatic hyperplasia)  . Neuropathy  . Gout  . CKD (chronic kidney disease), stage III  . Anemia, macrocytic  . Recurrent left inguinal hernia    Postoperative urinary retention Otherwise doing well status post laparoscopic repair of recurrent inguinal hernia  Plan: we will do another in no catheter now as he has 800 cc. If he is unable to eempty his bladder after this he will need a Foley catheter and likely have to go home with that.  Subjective: Not having very much pain from the incision. However he was unable to void last night it was catheterized with 800 cc of residual. A bladder scan just showed 800 cc residual and is not able to void since his first one. He is not taking his Flomax today. His last dose was yesterday. His incisions are causing any problems. His main issue is bladder function. He has known BPH  Objective: Vital signs in last 24 hours: Temp:  [97.5 F (36.4 C)-98.9 F (37.2 C)] 98.9 F (37.2 C) (12/07 0625) Pulse Rate:  [58-82] 73  (12/07 0625) Resp:  [10-21] 16  (12/07 0625) BP: (116-165)/(54-81) 132/81 mmHg (12/07 0625) SpO2:  [95 %-100 %] 99 % (12/07 0625) Weight:  [167 lb 8.8 oz (76 kg)] 167 lb 8.8 oz (76 kg) (12/06 1739)   Intake/Output from previous day: 12/06 0701 - 12/07 0700 In: 1075 [I.V.:1075] Out: 2274 [Urine:2224; Blood:50] Intake/Output this shift:     General appearance: alert, cooperative and no distress Resp: clear to auscultation bilaterally Cardio: regular rate and rhythm, S1, S2 normal, no murmur, click, rub or gallop GI: soft, slightly distended lower abdomen consistent with distended bladder. Minimal ecchymoses at the incisional sites.  Incision: healing well  Lab Results:   Basename  09/04/12 1426  WBC 7.8  HGB 12.8*  HCT 39.1  PLT 180   BMET  Basename 09/04/12 1426  NA 139  K 4.4  CL 102  CO2 28  GLUCOSE 92  BUN 38*  CREATININE 1.64*  CALCIUM 9.4   PT/INR No results found for this basename: LABPROT:2,INR:2 in the last 72 hours ABG No results found for this basename: PHART:2,PCO2:2,PO2:2,HCO3:2 in the last 72 hours  MEDS, Scheduled    . [COMPLETED] acetaminophen  1,000 mg Intravenous Once  . allopurinol  100 mg Oral BID  . [COMPLETED]  ceFAZolin (ANCEF) IV  2 g Intravenous On Call to OR  . enalapril  5 mg Oral Daily  . [EXPIRED] HYDROmorphone      . losartan  25 mg Oral Daily  . Tamsulosin HCl  0.4 mg Oral Daily  . [DISCONTINUED] chlorhexidine  1 application Topical Once  . [DISCONTINUED] chlorhexidine  1 application Topical Once    Studies/Results: Chest 2 View  09/04/2012  *RADIOLOGY REPORT*  Clinical Data: Preoperative evaluation.  Cough  CHEST - 2 VIEW  Comparison: 08/04/2011  Findings: Heart and mediastinal contours are stable with a normal heart size, mild aortic ectasia and aortic calcification.  The lung fields are notable for an area of focal scarring/atelectasis at the left lung base and are otherwise clear with no signs of focal infiltrate or congestive failure. There has been interval resolution of the left midlung zone infiltrates since the previous exam.  No pleural fluid or significant peribronchial cuffing  is identified.  Bony structures demonstrate degenerative change of the mid and lower thoracic spine.  IMPRESSION: No worrisome focal or acute cardiopulmonary abnormality seen   Original Report Authenticated By: Ponciano Ort, M.D.       LOS: 1 day     Haywood Lasso, MD, [facility], [place] [phone]   09/06/2012 8:50 AM

## 2012-09-06 NOTE — Progress Notes (Signed)
Voided 3x , bladder scan done to check PVR , showed 400 cc.  Will ambulate more and continue to monitor .

## 2012-09-07 MED ORDER — OXYCODONE-ACETAMINOPHEN 5-325 MG PO TABS: 1.0000 | ORAL_TABLET | Freq: Four times a day (QID) | ORAL | Status: DC | PRN

## 2012-09-07 NOTE — Progress Notes (Signed)
Recurrent left inguinal hernia  Assessment: Recurrent left inguinal hernia Doing well and able to go home  Plan: We will discharge today. Plans for followup as scheduled with Dr. Redmond Pulling   Subjective: Feels much better today. He has been voiding fine. He is a little worried about developing constipation after he goes home and we discussed that. He wants to go home today  Objective: Vital signs in last 24 hours: Temp:  [97.5 F (36.4 C)-98.2 F (36.8 C)] 98 F (36.7 C) (12/08 0551) Pulse Rate:  [72-90] 90  (12/08 0551) Resp:  [17-18] 17  (12/08 0551) BP: (120-143)/(69-77) 120/71 mmHg (12/08 0551) SpO2:  [94 %-96 %] 94 % (12/08 0551) Last BM Date: 09/05/12  Intake/Output from previous day: 12/07 0701 - 12/08 0700 In: 840 [P.O.:840] Out: 2894 [Urine:2894] Intake/Output this shift:    General appearance: alert, cooperative, appears stated age and no distress Resp: clear to auscultation bilaterally GI: soft, non-tender; bowel sounds normal; no masses,  no organomegaly Incision/Wound:all incisions healing nicely. Mild ecchymosis around the umbilicus.  Lab Results:  No results found for this or any previous visit (from the past 24 hour(s)).   Studies/Results Radiology     MEDS, Scheduled    . allopurinol  100 mg Oral BID  . enalapril  5 mg Oral Daily  . losartan  25 mg Oral Daily  . Tamsulosin HCl  0.4 mg Oral Daily       LOS: 2 days    Haywood Lasso, MD, [facility], [place]   09/07/2012 7:54 AM

## 2012-09-08 ENCOUNTER — Telehealth (INDEPENDENT_AMBULATORY_CARE_PROVIDER_SITE_OTHER): Payer: Self-pay

## 2012-09-08 ENCOUNTER — Encounter (HOSPITAL_COMMUNITY): Payer: Self-pay | Admitting: General Surgery

## 2012-09-08 NOTE — Telephone Encounter (Signed)
The wife called and reported the pt had difficulty voiding Friday and Saturday.  He was catheterized.  He has voided since he is home.  He is doing fairly well.  He does have some swelling which I told her sounds normal from the surgery.  I told to keep a watch on his ability to urinate.  He had his last bm on Friday morning.  He is taking a generic Laxative and stool softener.  I told her this is fine.  He is going to take another one this pm.  I told her if it didn't work she should get Miralax or Milk of Magnesia.  I asked her to call if he has more problems.

## 2012-09-09 NOTE — Discharge Summary (Signed)
Physician Discharge Summary  Drew Gibson [id number] DOB: 03-Mar-1929 DOA: 09/05/2012  PCP: [name], MD  Admit date: 09/05/2012 Discharge date: 09/07/12   Recommendations for Outpatient Follow-up:   Follow-up Information    Follow up with Gayland Curry, MD,FACS. On 09/18/2012. (8:30 AM)    Contact information:   [place] [id number] [phone]         Discharge Diagnoses:  1. Recurrent left inguinal hernia 2. BPH 3. Postoperative urinary retention  Surgical Procedure: laparoscopic repair of recurrent left indirect inguinal hernia with mesh  Discharge Condition: good Disposition: to home  Diet recommendation: cardiac  Filed Weights   09/05/12 1739  Weight: 167 lb 8.8 oz (76 kg)    Hospital Course:  The patient was taken to the operating room for planned laparoscopic repair of recurrent left inguinal hernia with mesh. He has known BPH and did require to be catherized postoperatively for urinary retention. On POD 2 he was voiding without difficulty. His vitals were stable. His incisions were without signs of infection. His pain was controlled. He was deemed stable for discharge.   Discharge Instructions      Discharge Orders    Future Appointments: Provider: Department: Dept Phone: Center:   09/18/2012 8:30 AM Gayland Curry, MD,FACS [facility], [place] [phone] None     Future Orders Please Complete By Expires   Diet - low sodium heart healthy      Increase activity slowly      Discharge instructions      Comments:   [facility] LAPAROSCOPIC SURGERY: POST OP INSTRUCTIONS Always review your discharge instruction sheet given to you by the facility where your surgery was performed. IF YOU HAVE DISABILITY OR FAMILY LEAVE FORMS, YOU MUST BRING THEM TO THE OFFICE FOR PROCESSING.   DO NOT GIVE THEM TO YOUR DOCTOR.  A prescription for pain medication may be given to you upon discharge.   Take your pain medication as prescribed, if needed.  If narcotic pain medicine is not needed, then you may take acetaminophen (Tylenol) or ibuprofen (Advil) as needed. Take your usually prescribed medications unless otherwise directed. If you need a refill on your pain medication, please contact your pharmacy.  They will contact our office to request authorization. Prescriptions will not be filled after 5pm or on week-ends. You should follow a light diet the first few days after arrival home, such as soup and crackers, etc.  Be sure to include lots of fluids daily. Most patients will experience some swelling and bruising in the area of the incisions.  Ice packs will help.  Swelling and bruising can take several days to resolve.  It is common to experience some constipation if taking pain medication after surgery.  Increasing fluid intake and taking a stool softener (such as Colace) will usually help or prevent this problem from occurring.  A mild laxative (Milk of Magnesia or Miralax) should be taken according to package instructions if there are no bowel movements after 48 hours. Unless discharge instructions indicate otherwise, you may remove your bandages 24-48 hours after surgery, and you may shower at that time.  You may have steri-strips (small skin tapes) in place directly over the incision.  These strips should be left on the skin for 7-10 days.  If your surgeon used skin glue on the incision, you may shower in 24 hours.  The glue will flake off over the next 2-3 weeks.  Any sutures  or staples will be removed at the office during your follow-up visit. ACTIVITIES:  You may resume regular (light) daily activities beginning the next day-such as daily self-care, walking, climbing stairs-gradually increasing activities as tolerated.  You may have sexual intercourse when it is comfortable.  Refrain from any heavy lifting or straining until approved by your doctor. You may drive when you are no longer  taking prescription pain medication, you can comfortably wear a seatbelt, and you can safely maneuver your car and apply brakes. RETURN TO WORK:  __________________________________________________________ Dennis Bast should see your doctor in the office for a follow-up appointment approximately 2-3 weeks after your surgery.  Make sure that you call for this appointment within a day or two after you arrive home to insure a convenient appointment time. OTHER INSTRUCTIONS: __________________________________________________________________________________________________________________________ __________________________________________________________________________________________________________________________ WHEN TO CALL YOUR DOCTOR: Fever over 101.0 Inability to urinate Continued bleeding from incision. Increased pain, redness, or drainage from the incision. Increasing abdominal pain  The clinic staff is available to answer your questions during regular business hours.  Please don't hesitate to call and ask to speak to one of the nurses for clinical concerns.  If you have a medical emergency, go to the nearest emergency room or call 911.  A surgeon from [facility] is always on call at the hospital. [address], [place], [place], [place]  [address] ? [address], [place], [place]   [address] [phone] ? [phone] ? FAX [phone] Web site: [email or web address]   No dressing needed          Medication List     As of 09/09/2012  2:52 PM    TAKE these medications         allopurinol 100 MG tablet   Commonly known as: ZYLOPRIM   Take 100 mg by mouth 2 (two) times daily.      aspirin 81 MG tablet   Take 81 mg by mouth daily.      b complex vitamins capsule   Take 1 capsule by mouth daily.      cholecalciferol 1000 UNITS tablet   Commonly known as: VITAMIN D   Take 2,000 Units by mouth daily.      enalapril 5 MG tablet   Commonly known as:  VASOTEC   Take 5 mg by mouth daily.      losartan 25 MG tablet   Commonly known as: COZAAR   Take 25 mg by mouth daily.      multivitamins ther. w/minerals Tabs   Take 1 tablet by mouth daily.      OVER THE COUNTER MEDICATION   Apply 1 application topically at bedtime as needed. Phys cream. For neuropathy pain      oxyCODONE-acetaminophen 5-325 MG per tablet   Commonly known as: PERCOCET/ROXICET   Take 1 tablet by mouth every 6 (six) hours as needed.      PROBIOTIC FORMULA PO   Take 1 capsule by mouth daily.      Tamsulosin HCl 0.4 MG Caps   Commonly known as: FLOMAX   Take 0.4 mg by mouth daily.         Follow-up Information    Follow up with Gayland Curry, MD,FACS. On 09/18/2012. (8:30 AM)    Contact information:   [place] [id number] [phone]           The results of significant diagnostics from this hospitalization (including imaging, microbiology, ancillary and laboratory) are listed below for  reference.    Significant Diagnostic Studies: Chest 2 View  09/04/2012  *RADIOLOGY REPORT*  Clinical Data: Preoperative evaluation.  Cough  CHEST - 2 VIEW  Comparison: 08/04/2011  Findings: Heart and mediastinal contours are stable with a normal heart size, mild aortic ectasia and aortic calcification.  The lung fields are notable for an area of focal scarring/atelectasis at the left lung base and are otherwise clear with no signs of focal infiltrate or congestive failure. There has been interval resolution of the left midlung zone infiltrates since the previous exam.  No pleural fluid or significant peribronchial cuffing is identified.  Bony structures demonstrate degenerative change of the mid and lower thoracic spine.  IMPRESSION: No worrisome focal or acute cardiopulmonary abnormality seen   Original Report Authenticated By: Ponciano Ort, M.D.     Microbiology: Recent Results (from the past 240 hour(s))  SURGICAL PCR SCREEN     Status:  Normal   Collection Time   09/04/12  2:27 PM      Component Value Range Status Comment   MRSA, PCR NEGATIVE  NEGATIVE Final    Staphylococcus aureus NEGATIVE  NEGATIVE Final      Labs: Basic Metabolic Panel:  Lab [id number]  NA 139  K 4.4  CL 102  CO2 28  GLUCOSE 92  BUN 38*  CREATININE 1.64*  CALCIUM 9.4  MG --  PHOS --   CBC:  Lab 09/04/12 1426  WBC 7.8  NEUTROABS 4.1  HGB 12.8*  HCT 39.1  MCV 99.2  PLT 180   Principal Problem:  *Recurrent left inguinal hernia Active Problems:  BPH (benign prostatic hyperplasia)  Neuropathy   Signed:  Gayland Curry, MD [facility], [place] [phone] 09/09/2012, 2:52 PM

## 2012-09-10 ENCOUNTER — Telehealth (INDEPENDENT_AMBULATORY_CARE_PROVIDER_SITE_OTHER): Payer: Self-pay

## 2012-09-10 NOTE — Telephone Encounter (Signed)
The pt called because he was told he would have a hematoma.  He does have some swelling and it goes down into his left testicle.  He wants to make sure everything is ok.  He is having pain when he is up moving around.  It feels better when he lays down.  He is doing fine now urinating and has had a bm.  He is not taking any pain pills and hasn't filled the RX.  I told him it sounds like expected postop pain.  I said it may be worse when he gets up and down and turns.  I told him to use Tylenol, and Advil and alternate those.  I said when not walking around, sit in a recliner and elevate.  He can try ice packs.  I told him if none of this usual stuff helps him to let us know before the weekend.

## 2012-09-10 NOTE — Telephone Encounter (Signed)
agreed

## 2012-09-11 ENCOUNTER — Telehealth (INDEPENDENT_AMBULATORY_CARE_PROVIDER_SITE_OTHER): Payer: Self-pay

## 2012-09-11 NOTE — Telephone Encounter (Signed)
Pt's wife called with several questions about dosing Mr. Kazimer pain medication.  She wanted to know how much pain medication he should be taking, and could he take Ibuprofen as well.  I told her that it was fine to alternate the Percocet with the Ibuprofen.  He can take up to 800 mg of Ibuprofen daily.

## 2012-09-18 ENCOUNTER — Encounter (INDEPENDENT_AMBULATORY_CARE_PROVIDER_SITE_OTHER): Payer: Self-pay | Admitting: General Surgery

## 2012-09-18 ENCOUNTER — Ambulatory Visit (INDEPENDENT_AMBULATORY_CARE_PROVIDER_SITE_OTHER): Payer: Medicare Other | Admitting: General Surgery

## 2012-09-18 VITALS — BP 138/82 | HR 60 | Temp 97.2°F | Resp 18 | Ht 70.0 in | Wt 155.6 lb

## 2012-09-18 DIAGNOSIS — Z09 Encounter for follow-up examination after completed treatment for conditions other than malignant neoplasm: Secondary | ICD-10-CM

## 2012-09-18 NOTE — Patient Instructions (Signed)
Take the pain medicine as needed Take a stool softner if you take the pain med No lifting, pushing, or pulling anything greater than 15 pounds for another 4 weeks The hematoma will get smaller but it will take time

## 2012-09-18 NOTE — Progress Notes (Signed)
Subjective:     Patient ID: Drew Gibson, male   DOB: Feb 17, 1929, 78 y.o.   MRN: [id number]  HPI 78 year old Caucasian male comes in for followup after undergoing laparoscopic repair of a recurrent left indirect inguinal hernia with mesh on December 6. The patient developed a postoperative hematoma. He states that it tends to bother him at night while he is trying to sleep. He describes it as a burning and stinging sensation. He denies any fever, chills, nausea, vomiting, diarrhea or constipation. He denies any dysuria.  Review of Systems     Objective:   Physical Exam BP 138/82  Pulse 60  Temp 97.2 F (36.2 C)  Resp 18  Ht 5\' 10"  (1.778 m)  Wt 155 lb 9.6 oz (70.58 kg)  BMI 22.33 kg/m2  Gen: alert, NAD, non-toxic appearing Abd: soft, nontender, nondistended. Well-healed trocar sites. No cellulitis. No incisional hernia GU: both testicles descended,large left inguinal hematoma extending into l scrotum Ext: no edema    Assessment:     Status post laparoscopic repair of left recurrent indirect inguinal hernia with mesh with postoperative hematoma.    Plan:     I explained to him and his wife that he has developed a postoperative hematoma. I explained that the treatment for this is generally observation. I explained that it will go down with time however it may take 2-3 months for it to completely resolve. I encouraged him to take 1 of his pain medications or Tylenol for the discomfort. I reminded him he should not do any heavy lifting for another 4 weeks. I explained that the old mesh needed to be removed at the time of surgery because if not- it would interfere with repair of the recurrent hernia. The mesh was quite adherent and stuck to portions of the abdominal wall and testicular vessels. In removing the mesh it resulted some intraoperative bleeding. The bleeding appeared to be resolved by the end of surgery. However he developed a postoperative hematoma. I will see him back in 8  weeks for followup  Leighton Ruff. Redmond Pulling, MD, FACS General, Bariatric, & Minimally Invasive Surgery [facility], [place]

## 2012-10-24 IMAGING — CR DG CHEST 1V PORT
1 series · 1 of 1 positions shown · non-contrast
Comparison: 07/10/2010

CLINICAL DATA: Fever, cough

PORTABLE CHEST - 1 VIEW

[view not recorded]
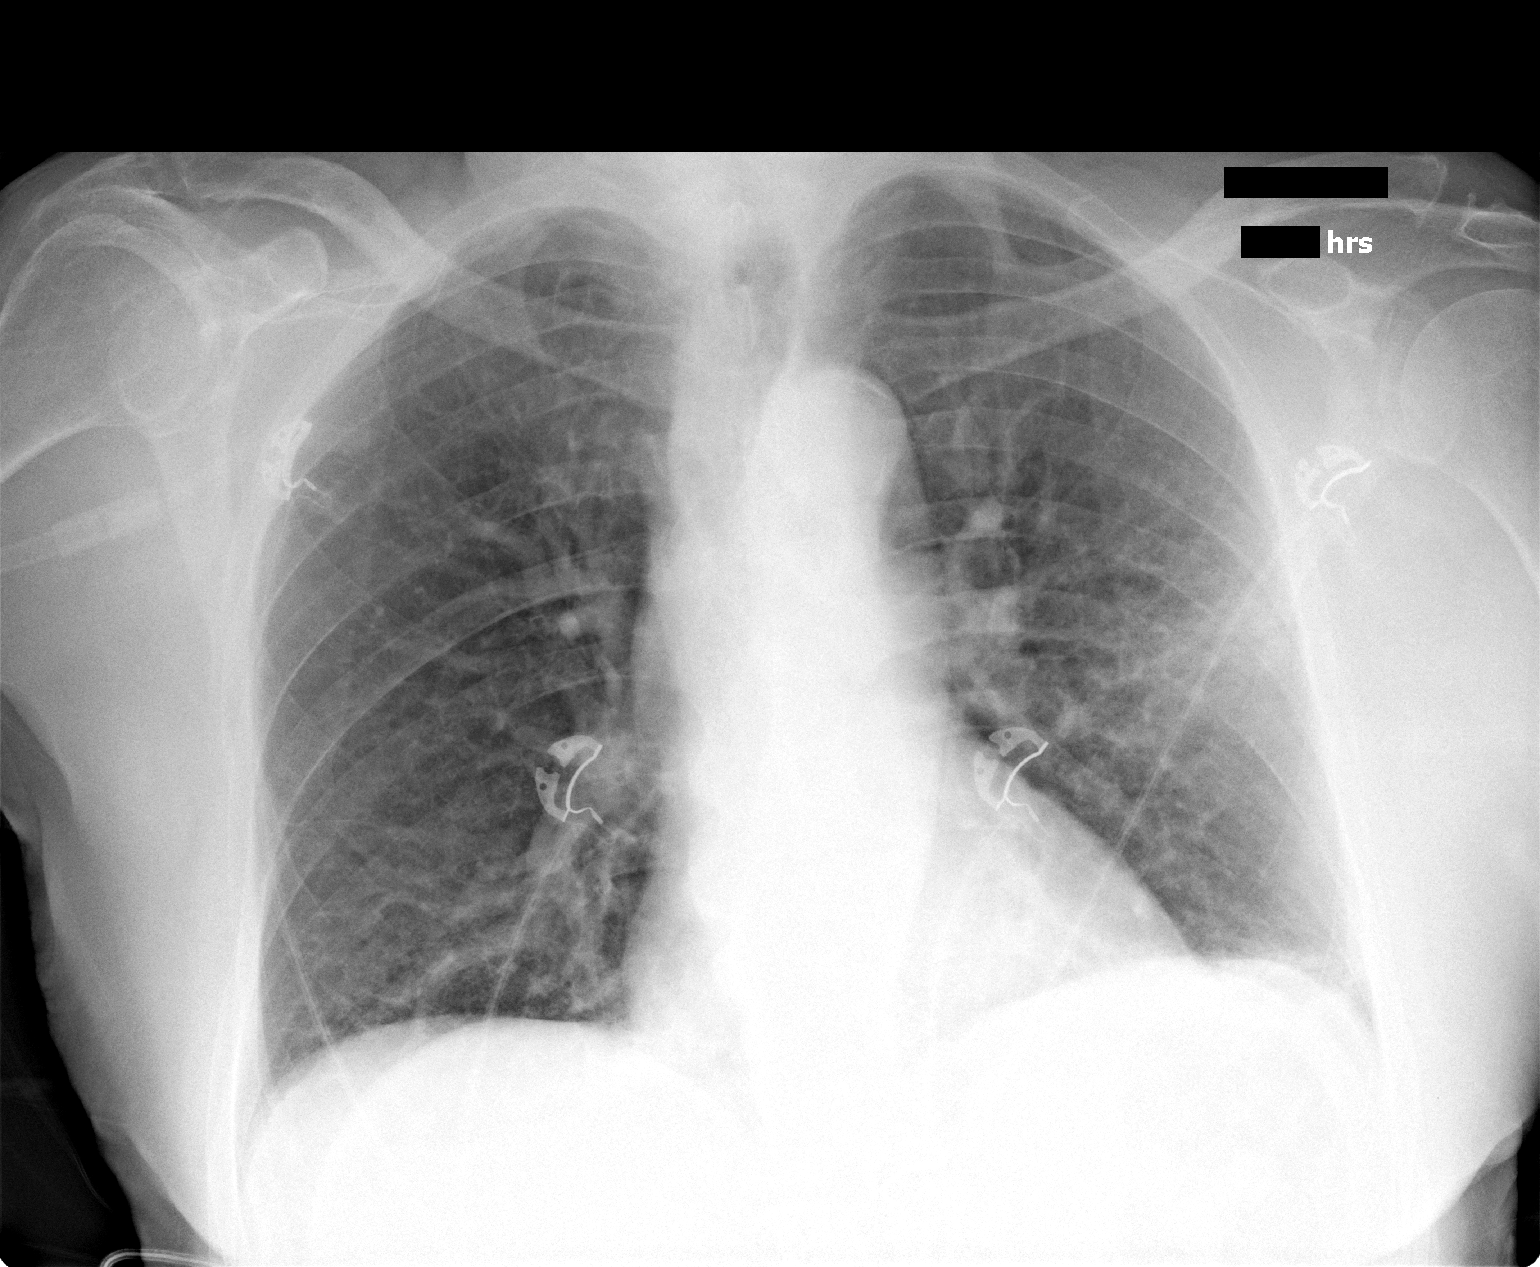

[1 of 1 positions shown; findings below may reference images not displayed]

FINDINGS: Decreased lung volumes with mild vascular congestion.
Left midlung faint peripheral airspace opacity suspicious for
underlying pneumonia.  Minimal basilar atelectasis.  No current
CHF, effusion or pneumothorax.  Atherosclerosis of the aorta.
IMPRESSION: Left midlung airspace disease/pneumonia.

Decreased lung volumes and vascular congestion

Basilar atelectasis

## 2012-11-13 ENCOUNTER — Encounter (INDEPENDENT_AMBULATORY_CARE_PROVIDER_SITE_OTHER): Payer: Medicare Other | Admitting: General Surgery

## 2012-12-03 DIAGNOSIS — R51 Headache: Secondary | ICD-10-CM | POA: Insufficient documentation

## 2012-12-03 DIAGNOSIS — M79609 Pain in unspecified limb: Secondary | ICD-10-CM | POA: Insufficient documentation

## 2012-12-03 DIAGNOSIS — H02409 Unspecified ptosis of unspecified eyelid: Secondary | ICD-10-CM | POA: Insufficient documentation

## 2012-12-03 DIAGNOSIS — R269 Unspecified abnormalities of gait and mobility: Secondary | ICD-10-CM | POA: Insufficient documentation

## 2012-12-03 DIAGNOSIS — R519 Headache, unspecified: Secondary | ICD-10-CM | POA: Insufficient documentation

## 2012-12-10 ENCOUNTER — Ambulatory Visit (INDEPENDENT_AMBULATORY_CARE_PROVIDER_SITE_OTHER): Payer: Medicare Other | Admitting: General Surgery

## 2012-12-10 ENCOUNTER — Encounter (INDEPENDENT_AMBULATORY_CARE_PROVIDER_SITE_OTHER): Payer: Self-pay | Admitting: General Surgery

## 2012-12-10 VITALS — BP 90/56 | HR 76 | Temp 100.6°F | Resp 16 | Ht 68.0 in | Wt 156.0 lb

## 2012-12-10 DIAGNOSIS — Z09 Encounter for follow-up examination after completed treatment for conditions other than malignant neoplasm: Secondary | ICD-10-CM

## 2012-12-10 NOTE — Progress Notes (Signed)
Subjective:     Patient ID: Drew Gibson, male   DOB: Jan 30, 1929, 79 y.o.   MRN: [id number]  HPI 79 year old Caucasian male comes in for long-term followup after undergoing a laparoscopic repair of a recurrent left indirect inguinal hernia with mesh on 09/05/2012. He developed a large postoperative inguinal and scrotal hematoma. He states that he is doing much better. He states the hematoma is very small. He states he really has not had history she states that once or twice a day he will have the chart this also in his left groin that lasts for about 2-3 seconds.he notices it when he tries to cross his legs or gets up from a seated position. It really doesn't bother him.  Review of Systems     Objective:   Physical Exam BP 90/56  Pulse 76  Temp(Src) 100.6 F (38.1 C) (Temporal)  Resp 16  Ht 5\' 8"  (1.727 m)  Wt 156 lb (70.761 kg)  BMI 23.73 kg/m2 Alert, nad Pleasant Soft, nt, nd. Well healed trocar sites No scrotal hematoma. Both testicles down. Small walnut size left inguinal hematoma. No evidence of hernia    Assessment:     Status post laparoscopic repair of left recurrent indirect inguinal hernia with mesh with a resolving postoperative hematoma     Plan:     As expected the hematoma has gotten significantly smaller. I explained to him and his wife that the hematoma will continue to resolve with time. The patient had this sharp stinging burning pain preoperatively. I explained to him and his wife that he should continue to notice improvement with this intermittent discomfort since he has noticed considerable improvement since his last visit. We had a nice visit. Followup in 3 months  Leighton Ruff. Redmond Pulling, MD, FACS General, Bariatric, & Minimally Invasive Surgery [facility], [place]

## 2012-12-10 NOTE — Patient Instructions (Signed)
Can resume full activities

## 2012-12-22 ENCOUNTER — Encounter (INDEPENDENT_AMBULATORY_CARE_PROVIDER_SITE_OTHER): Payer: Self-pay

## 2013-02-06 ENCOUNTER — Other Ambulatory Visit: Payer: Self-pay | Admitting: *Deleted

## 2013-02-06 DIAGNOSIS — I10 Essential (primary) hypertension: Secondary | ICD-10-CM

## 2013-02-06 DIAGNOSIS — E785 Hyperlipidemia, unspecified: Secondary | ICD-10-CM

## 2013-02-06 DIAGNOSIS — N183 Chronic kidney disease, stage 3 unspecified: Secondary | ICD-10-CM

## 2013-03-03 ENCOUNTER — Telehealth: Payer: Self-pay | Admitting: Neurology

## 2013-03-03 NOTE — Telephone Encounter (Signed)
[name] called stating the patient balance has become worse and he can't pick up his feet after working carpet on yesterday.

## 2013-03-03 NOTE — Telephone Encounter (Signed)
I called the patient and I left a message. The patient had an apparent change in his ability to ambulate. I'll need to get in for a revisit to determine what is going on. The patient has not had MRI evaluation of the brain were lumbosacral spine in several years.

## 2013-03-09 ENCOUNTER — Encounter: Payer: Self-pay | Admitting: Neurology

## 2013-03-09 DIAGNOSIS — I639 Cerebral infarction, unspecified: Secondary | ICD-10-CM | POA: Insufficient documentation

## 2013-03-09 DIAGNOSIS — H02409 Unspecified ptosis of unspecified eyelid: Secondary | ICD-10-CM

## 2013-03-09 DIAGNOSIS — R51 Headache: Secondary | ICD-10-CM

## 2013-03-09 DIAGNOSIS — R269 Unspecified abnormalities of gait and mobility: Secondary | ICD-10-CM

## 2013-03-09 DIAGNOSIS — M79609 Pain in unspecified limb: Secondary | ICD-10-CM

## 2013-03-11 ENCOUNTER — Encounter: Payer: Self-pay | Admitting: Neurology

## 2013-03-11 ENCOUNTER — Ambulatory Visit (INDEPENDENT_AMBULATORY_CARE_PROVIDER_SITE_OTHER): Payer: Medicare Other | Admitting: Neurology

## 2013-03-11 VITALS — BP 111/62 | HR 78 | Wt 154.0 lb

## 2013-03-11 DIAGNOSIS — G63 Polyneuropathy in diseases classified elsewhere: Secondary | ICD-10-CM

## 2013-03-11 DIAGNOSIS — R269 Unspecified abnormalities of gait and mobility: Secondary | ICD-10-CM

## 2013-03-11 DIAGNOSIS — G609 Hereditary and idiopathic neuropathy, unspecified: Secondary | ICD-10-CM

## 2013-03-11 NOTE — Progress Notes (Signed)
Reason for visit: Gait disturbance  Drew Drew Gibson is an 79 y.o. male  History of present illness:  Mr. Drew Drew Gibson is an 79 year old right-handed white male with a history of a peripheral neuropathy and a gait disturbance. The patient uses a walker for ambulation, but he still has a tendency to lean backwards and he will occasionally fall. The patient indicated that around 03/03/2013, he had a sudden onset of an "unusual feeling" in his head. The patient noted a sudden change in his ability to ambulate that has persisted. The patient denied any headache, slurred speech, visual disturbance, double vision, or new numbness or weakness of the arms or legs. The patient has had ongoing persistent problems with walking, but the "unusual feeling" has improved. The patient denies any change in bowel or bladder control. The patient comes to this office for an evaluation.  Past Medical History  Diagnosis Date  . Gout     takes Allopurinol daily  . Peripheral neuropathy   . Enlarged prostate     takes Tamsulosin daily  . Hypertension     takes Enalapril daily   . Hyperlipidemia     but doesn't require meds  . Pneumonia     hx of;last time a yr ago  . Peripheral neuropathy   . TIA (transient ischemic attack)     sees Drew Drew Gibson-neurologist  . Urinary frequency   . Urinary urgency   . Macular degeneration     takes eye cap vits daily  . Foot drop, bilateral   . Gait disorder   . Degenerative arthritis   . Ptosis     Left side    Past Surgical History  Procedure Laterality Date  . Hernia repair      at least 63yrs ago  . Left leg surgery  2011  . Colonosocpy    . Inguinal hernia repair  09/05/2012    Procedure: LAPAROSCOPIC INGUINAL HERNIA;  Surgeon: Gayland Curry, MD,FACS;  Location: [place];  Service: General;  Laterality: Left;  . Insertion of mesh  09/05/2012    Procedure: INSERTION OF MESH;  Surgeon: Gayland Curry, MD,FACS;  Location: [place];  Service: General;  Laterality: Left;  . Finger  surgery Left     Left ring finger  . Nerve biopsy      Family History  Problem Relation Age of Onset  . Diabetes Mother     type 2  . Leukemia Father   . COPD Brother     Social history:  reports that he has quit smoking. He does not have any smokeless tobacco history on file. He reports that he does not drink alcohol or use illicit drugs.  Allergies:  Allergies  Allergen Reactions  . Aleve (Naproxen Sodium)   . Nsaids     Medications:  Current Outpatient Prescriptions on File Prior to Visit  Medication Sig Dispense Refill  . allopurinol (ZYLOPRIM) 100 MG tablet Take 100 mg by mouth 2 (two) times daily.       Marland Kitchen aspirin 81 MG tablet Take 81 mg by mouth daily.      Marland Kitchen b complex vitamins capsule Take 1 capsule by mouth daily.        . cholecalciferol (VITAMIN D) 1000 UNITS tablet Take 2,000 Units by mouth daily.       . enalapril (VASOTEC) 5 MG tablet Take 5 mg by mouth daily.      . Multiple Vitamins-Minerals (MULTIVITAMINS THER. W/MINERALS) TABS Take 1 tablet by mouth daily.        Marland Kitchen  OVER THE COUNTER MEDICATION Apply 1 application topically at bedtime as needed. Phys cream. For neuropathy pain      . Tamsulosin HCl (FLOMAX) 0.4 MG CAPS Take 0.4 mg by mouth daily.         No current facility-administered medications on file prior to visit.    ROS:  Out of a complete 14 system review of symptoms, the patient complains only of the following symptoms, and all other reviewed systems are negative.  Difficulty swallowing, slight hearing loss Cough, snoring Impotence Easy bruising Muscle cramps Numbness, weakness, difficulty swallowing, tremor Decreased energy  Blood pressure 111/62, pulse 78, weight 154 lb (69.854 kg).  Physical Exam  General: The patient is alert and cooperative at the time of the examination.  Skin: No significant peripheral edema is noted.   Neurologic Exam  Cranial nerves: Facial symmetry is present. Speech is normal, no aphasia or dysarthria is  noted. Extraocular movements are full. Visual fields are full.  Motor: The patient has weakness distally in the arms and legs. The patient has weakness of the intrinsic muscles of the hands bilaterally, and prominent bilateral foot drops. Possible strength is unremarkable. The patient has wasting of the first dorsal interossei.  Coordination: The patient has good finger-nose-finger and heel-to-shin bilaterally.  Gait and station: The patient has a wide-based, unsteady gait. The patient walks with a walker. Tandem gait was not attempted. Romberg is positive, the patient falls backwards. No drift is seen.  Reflexes: Deep tendon reflexes are symmetric, but are depressed.   Assessment/Plan:  1. Gait disturbance  2. Peripheral neuropathy  The patient has a chronic gait disturbance associated with his severe peripheral neuropathy. The patient however, noted a sudden onset of change in his ability to ambulate associated with a "unusual feeling" in the head. The patient will need to be evaluated for possible cerebrovascular disease. The patient will be set up for MRI evaluation of the brain, and physical therapy will be set up for gait training. The patient otherwise will followup in 5 or 6 months. I will contact the patient concerning the results of the MRI. If a subacute stroke is noted, a further cerebrovascular disease workup will be undertaken.  Jill Alexanders MD 03/11/2013 7:39 PM  [facility] [address] [place], [place] [address]  Phone [phone] Fax [phone]

## 2013-03-12 ENCOUNTER — Encounter (INDEPENDENT_AMBULATORY_CARE_PROVIDER_SITE_OTHER): Payer: Medicare Other | Admitting: General Surgery

## 2013-03-12 ENCOUNTER — Ambulatory Visit (INDEPENDENT_AMBULATORY_CARE_PROVIDER_SITE_OTHER): Payer: Medicare Other | Admitting: General Surgery

## 2013-03-12 ENCOUNTER — Encounter (INDEPENDENT_AMBULATORY_CARE_PROVIDER_SITE_OTHER): Payer: Self-pay | Admitting: General Surgery

## 2013-03-12 VITALS — BP 128/70 | HR 72 | Temp 97.1°F | Resp 16 | Ht 69.0 in | Wt 155.0 lb

## 2013-03-12 DIAGNOSIS — Z09 Encounter for follow-up examination after completed treatment for conditions other than malignant neoplasm: Secondary | ICD-10-CM

## 2013-03-12 NOTE — Patient Instructions (Signed)
Call if you need anything! 

## 2013-03-12 NOTE — Progress Notes (Signed)
Subjective:     Patient ID: Drew Gibson, male   DOB: June 21, 1929, 79 y.o.   MRN: [id number]  HPI 79 year old Caucasian male comes in for long-term followup undergoing laparoscopic repair of her recurrent left indirect inguinal hernia with mesh in December 2013. I last saw him about 77 months ago. He had a resolving scrotal hematoma as well as some intermittent sharp pain in his left groin. He states that he has been doing well. He states that the hematoma has resolved. He states that once in a blue moon he may have one or 2 seconds of discomfort in his left groin. Apparently last week he had a change in his gait and is awaiting to have an MRI of his brain. Other than that there've been no changes  Review of Systems     Objective:   Physical Exam BP 128/70  Pulse 72  Temp(Src) 97.1 F (36.2 C) (Oral)  Resp 16  Ht 5\' 9"  (1.753 m)  Wt 155 lb (70.308 kg)  BMI 22.88 kg/m2 Elderly Caucasian male in no apparent distress Abdomen-soft, nontender, nondistended. Well-healed trocar incisions. GU-both testicles descended and symmetric. No evidence of scrotal or inguinal hematoma. No evidence of hernia recurrence    Assessment:     Status post laparoscopic repair of recurrent left indirect inguinal hernia with mesh in December 2013.     Plan:     He had a very challenging repair due to his prior repairs. His inguinal hematoma has resolved. It appears his significant discomfort has resolved as well. He has been released to full activities. we had a pleasant conversation. followup as needed  Leighton Ruff. Redmond Pulling, MD, FACS General, Bariatric, & Minimally Invasive Surgery [facility], [place]

## 2013-03-20 ENCOUNTER — Ambulatory Visit
Admission: RE | Admit: 2013-03-20 | Discharge: 2013-03-20 | Disposition: A | Discharge status: Home / Self Care | Payer: Medicare Other | Source: Ambulatory Visit | Attending: Neurology | Admitting: Neurology

## 2013-03-20 ENCOUNTER — Telehealth: Payer: Self-pay | Admitting: Neurology

## 2013-03-20 DIAGNOSIS — G63 Polyneuropathy in diseases classified elsewhere: Secondary | ICD-10-CM

## 2013-03-20 DIAGNOSIS — R269 Unspecified abnormalities of gait and mobility: Secondary | ICD-10-CM

## 2013-03-20 DIAGNOSIS — I6340 Cerebral infarction due to embolism of unspecified cerebral artery: Secondary | ICD-10-CM

## 2013-03-20 MED ORDER — CLOPIDOGREL BISULFATE 75 MG PO TABS: 75.0000 mg | ORAL_TABLET | Freq: Every day | ORAL | Status: DC

## 2013-03-20 NOTE — Telephone Encounter (Signed)
I called the patient. The MRI the brain shows evidence of an acute or subacute right frontal stroke. I'll set the patient up for a 2-D echocardiogram through [facility] cardiology, and a carotid Doppler study. The patient will be switched from aspirin to Plavix. The patient has been set up for physical therapy for his gait.

## 2013-03-26 ENCOUNTER — Ambulatory Visit (INDEPENDENT_AMBULATORY_CARE_PROVIDER_SITE_OTHER): Payer: Medicare Other

## 2013-03-26 DIAGNOSIS — I6340 Cerebral infarction due to embolism of unspecified cerebral artery: Secondary | ICD-10-CM

## 2013-03-30 ENCOUNTER — Telehealth: Payer: Self-pay | Admitting: Neurology

## 2013-03-30 DIAGNOSIS — R269 Unspecified abnormalities of gait and mobility: Secondary | ICD-10-CM

## 2013-03-30 DIAGNOSIS — I63239 Cerebral infarction due to unspecified occlusion or stenosis of unspecified carotid arteries: Secondary | ICD-10-CM

## 2013-03-30 NOTE — Telephone Encounter (Signed)
I called patient. The carotid Doppler study was unremarkable. The patient has not yet heard about the 2-D echocardiogram. I will make another referral. I'll get the patient set up for physical therapy for his walking. The patient has a power wheelchair, and he wonders whether he can get new batteries for this.

## 2013-04-01 ENCOUNTER — Telehealth: Payer: Self-pay | Admitting: Neurology

## 2013-04-02 NOTE — Telephone Encounter (Signed)
Given to [place] in referrals.

## 2013-04-06 ENCOUNTER — Ambulatory Visit: Payer: Medicare Other | Attending: Neurology | Admitting: Physical Therapy

## 2013-04-06 DIAGNOSIS — R269 Unspecified abnormalities of gait and mobility: Secondary | ICD-10-CM | POA: Insufficient documentation

## 2013-04-06 DIAGNOSIS — IMO0001 Reserved for inherently not codable concepts without codable children: Secondary | ICD-10-CM | POA: Insufficient documentation

## 2013-04-06 DIAGNOSIS — R279 Unspecified lack of coordination: Secondary | ICD-10-CM | POA: Insufficient documentation

## 2013-04-06 DIAGNOSIS — M6281 Muscle weakness (generalized): Secondary | ICD-10-CM | POA: Insufficient documentation

## 2013-04-13 ENCOUNTER — Ambulatory Visit: Payer: Medicare Other | Admitting: Physical Therapy

## 2013-04-20 ENCOUNTER — Encounter: Payer: Medicare Other | Admitting: Physical Therapy

## 2013-04-21 ENCOUNTER — Other Ambulatory Visit: Payer: Medicare Other

## 2013-04-21 ENCOUNTER — Other Ambulatory Visit: Payer: Self-pay | Admitting: Internal Medicine

## 2013-04-21 ENCOUNTER — Ambulatory Visit: Payer: Self-pay | Admitting: Internal Medicine

## 2013-04-27 ENCOUNTER — Encounter: Payer: Self-pay | Admitting: *Deleted

## 2013-04-27 ENCOUNTER — Encounter: Payer: Medicare Other | Admitting: Physical Therapy

## 2013-04-28 ENCOUNTER — Encounter: Payer: Self-pay | Admitting: Internal Medicine

## 2013-04-28 ENCOUNTER — Ambulatory Visit (INDEPENDENT_AMBULATORY_CARE_PROVIDER_SITE_OTHER): Payer: Medicare Other | Admitting: Internal Medicine

## 2013-04-28 VITALS — BP 130/60 | HR 73 | Temp 97.4°F | Resp 16 | Ht 69.0 in | Wt 157.0 lb

## 2013-04-28 DIAGNOSIS — N183 Chronic kidney disease, stage 3 unspecified: Secondary | ICD-10-CM

## 2013-04-28 DIAGNOSIS — I6350 Cerebral infarction due to unspecified occlusion or stenosis of unspecified cerebral artery: Secondary | ICD-10-CM

## 2013-04-28 DIAGNOSIS — R269 Unspecified abnormalities of gait and mobility: Secondary | ICD-10-CM

## 2013-04-28 DIAGNOSIS — I679 Cerebrovascular disease, unspecified: Secondary | ICD-10-CM

## 2013-04-28 DIAGNOSIS — H5702 Anisocoria: Secondary | ICD-10-CM | POA: Insufficient documentation

## 2013-04-28 DIAGNOSIS — G63 Polyneuropathy in diseases classified elsewhere: Secondary | ICD-10-CM

## 2013-04-28 DIAGNOSIS — E785 Hyperlipidemia, unspecified: Secondary | ICD-10-CM

## 2013-04-28 DIAGNOSIS — R972 Elevated prostate specific antigen [PSA]: Secondary | ICD-10-CM

## 2013-04-28 DIAGNOSIS — M48062 Spinal stenosis, lumbar region with neurogenic claudication: Secondary | ICD-10-CM

## 2013-04-28 DIAGNOSIS — I639 Cerebral infarction, unspecified: Secondary | ICD-10-CM

## 2013-04-28 LAB — BASIC METABOLIC PANEL
BUN/Creatinine Ratio: 23 — ABNORMAL HIGH (ref 10–22)
BUN: 37 mg/dL — ABNORMAL HIGH (ref 8–27)
Chloride: 103 mmol/L (ref 97–108)
Creatinine, Ser: 1.64 mg/dL — ABNORMAL HIGH (ref 0.76–1.27)
GFR calc Af Amer: 45 mL/min/{1.73_m2} — ABNORMAL LOW (ref >59)
GFR calc non Af Amer: 39 mL/min/{1.73_m2} — ABNORMAL LOW (ref >59)
Glucose: 83 mg/dL (ref 65–99)

## 2013-04-28 LAB — LIPID PANEL WITH LDL/HDL RATIO
Cholesterol, Total: 229 mg/dL — ABNORMAL HIGH (ref 100–199)
HDL: 54 mg/dL (ref >39)
LDL Calculated: 156 mg/dL — ABNORMAL HIGH (ref 0–99)
LDl/HDL Ratio: 2.9 ratio units (ref 0.0–3.6)
Triglycerides: 96 mg/dL (ref 0–149)
VLDL Cholesterol Cal: 19 mg/dL (ref 5–40)

## 2013-04-28 NOTE — Patient Instructions (Addendum)
Continue current medications. 

## 2013-04-28 NOTE — Progress Notes (Signed)
Patient ID: Drew Gibson, male   DOB: 09-04-1929, 79 y.o.   MRN: [id number]: [id number] Name: Drew Gibson  Sex: male Age: 79 y.o. DOB: 09/02/1929  Level Of Care: Independent Provider: [name] Emergency Contacts: Extended Emergency Contact Information Primary Emergency Contact: Pratte,Betty Address: [place], [place] [address] [place] Phone: [phone] Relation: Spouse  Code Status: LIVING WILL  Allergies: Aleve; Nsaids; and Indocin  Chief Complaint  Patient presents with  . Annual Exam    HPI: Patient is 79 y.o. male who presents for a complete exam and review of his medical issues.  Had CVA 03/11/13. Has seen Dr. Jannifer Franklin. Had MRI brain, Carotid Doppler, and 2D echo. Has residual increase in unsteady gait (worse than in the past)  Now using a power wheelchair he acquired second hand. Needs a new battery.  Past Medical History  Diagnosis Date  . Gout     takes Allopurinol daily  . Peripheral neuropathy   . Enlarged prostate     takes Tamsulosin daily  . Hypertension     takes Enalapril daily   . Hyperlipidemia     but doesn't require meds  . Pneumonia     hx of;last time a yr ago  . Peripheral neuropathy   . TIA (transient ischemic attack)     sees Dr.Willis-neurologist  . Urinary frequency   . Urinary urgency   . Macular degeneration     takes eye cap vits daily  . Foot drop, bilateral   . Gait disorder   . Degenerative arthritis   . Ptosis     Left side  . Chronic kidney disease, stage III (moderate)   . Inguinal hernia without mention of obstruction or gangrene, unilateral or unspecified, (not specified as recurrent)   . Other abnormal blood chemistry   . Spinal stenosis, lumbar region, with neurogenic claudication   . Unspecified vitamin D deficiency   . Essential and other specified forms of tremor   . Abnormality of gait   . Gout, unspecified   . Encounter for long-term (current) use of other  medications   . Internal hemorrhoids without mention of complication   . Benign neoplasm of colon   . Diverticulosis of colon (without mention of hemorrhage)   . Duodenal ulcer, unspecified as acute or chronic, without hemorrhage, perforation, or obstruction     Past Surgical History  Procedure Laterality Date  . Hernia repair      at least 28yrs ago  . Left leg surgery  2011  . Colonosocpy    . Inguinal hernia repair  09/05/2012    Procedure: LAPAROSCOPIC INGUINAL HERNIA;  Surgeon: Gayland Curry, MD,FACS;  Location: [facility];  Service: General;  Laterality: Left;  . Insertion of mesh  09/05/2012    Procedure: INSERTION OF MESH;  Surgeon: Gayland Curry, MD,FACS;  Location: [facility];  Service: General;  Laterality: Left;  . Finger surgery Left     Left ring finger  . Nerve biopsy        Medication List       This list is accurate as of: 04/28/13  3:24 PM.  Always use your most recent med list.               allopurinol 100 MG tablet  Commonly known as:  ZYLOPRIM  Take 100 mg by mouth 2 (two) times daily.     b complex  vitamins capsule  Take 1 capsule by mouth daily.     cholecalciferol 1000 UNITS tablet  Commonly known as:  VITAMIN D  Take 2,000 Units by mouth daily.     clopidogrel 75 MG tablet  Commonly known as:  PLAVIX  Take 1 tablet (75 mg total) by mouth daily.     enalapril 5 MG tablet  Commonly known as:  VASOTEC  Take 5 mg by mouth daily.     multivitamins ther. w/minerals Tabs  Take 1 tablet by mouth daily.     omeprazole 20 MG capsule  Commonly known as:  PRILOSEC  Take 20 mg by mouth daily. For reflux     OVER THE COUNTER MEDICATION  Apply 1 application topically at bedtime as needed. Phys cream. For neuropathy pain     tamsulosin 0.4 MG Caps  Commonly known as:  FLOMAX  Take 0.4 mg by mouth daily.       CONSULTANTS Urology: [facility] Ophthalmology: Katy Fitch  Hand surgery: Sypher Gastroenterology: Lajoyce Corners General surgery: Rosana Hoes Hematology:  Beryle Beams Dermatology: Teressa Senter Neurology: Jannifer Franklin Orthopedics: Caralyn Guile [place]/ [facility]   PROCEDURES 1982-GB-OCG/UGI:Normal/PUD 2002- [id number] ulcers 1990-IVP:"thickened trigone" 1990-BaE:Diverticulosis 1997-CT abdomen and pelvis:diverticulosis; renal cysts 1997-Colonoscopy:Diverticulosis 1998-PNCV:diffuse sensory motor polyneuropathy 1998-MRI brain:Normal 1999-Bone marrow aspiration and biopsy:Normal-Dr.Granfortuna 2001-Excise squamous cell papule of scalp-Dr.Nolan 2001-Right sural nerve biopsy-Dr.McWhorten 10-23-07-MRI Brain:Abnormal MRI scan of the brain without contrast demonstrating mild to moderate chronic small vessel ischemic changes including the pons. Scattered pinpoint hypo intensities on the gradient recalled images could represent small micro hemorrhages or calcification clinical correlation is recommended. There is no evidence of acute infarct, hemorrhage or mass effect. 10-23-07-MRA Intracranial: Normal intracranial MRA. It should be noted that aneurysms smaller than 55mm may be missed by current MR technology. 10/07/2008 X-Ray of the Pelvis Osteoarthritis in hips no fracture  10/07/2008 X-Ray of thr Right Hip Degenerative Changes  08/03/2009 MRI of Lumbar Spine  Multilevel multifactorial spinal.Lateral recess and foraminal stenosis. L4-5 Right paracentral disc protrusion at T12-L1  09-16-08-Chest X-Ray: No acute disease  03/15/10 Epidural spinal Ernestina Patches) 07/27/2010 CT Head negative for bleed or acute intracranial process. Atrophy and nonspecific white matter changes. 08/04/2011-Chest X-Ray: Left mid lung airspace disease/pneumonia. Decreased lung volumes and vascular congestion. Basilar atelectasis. 08/06/11 2D echocardiogram showing LVEF 55-60%. When: Normal. Aortic valve showed moderate focal thickening of the tissue. Mitral valve normal. Aortic valve normal. Right ventricle, pulmonic valve, tricuspid valve, right atrium, and pericardium were normal.  11/06/2011  chest x-ray: Normal  03/14/2013 MRI brain:1. Subacute right posterior frontal ischemic infarction (1.1cm).   2. Moderate periventricular and subcortical chronic small vessel   ischemic disease.   3. Multiple chronic cerebral microhemorrhages noted in the   bilateral cerebral hemispheres, may reflect amyloid angiopathy or   chronic small vessel ischemic disease.   4. Mild diffuse and moderate mesial temporal atrophy.   5. Compared to MRI on 10/23/07, the right frontal subacute infarct    is a new finding. Also, there is progression of atrophy and   chronic small vessel ischemic disease   03/20/2013 ultrasound of carotid arteries: No evidence of hemodynamically significant stenoses  Immunization History  Administered Date(s) Administered  . Influenza-Generic 07/11/2011  . Pneumococcal Conjugate 11/02/1995  . 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 Tobacco Use: Stopped in 1970 Alcohol: None Caffeine: Minimal amount daily Exercises: None Diet:  Unrestricted Pets in Home: None   History  Substance Use Topics  . 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 Smokeless tobacco: Not on file     Comment: 25 yrs ago  . Alcohol Use: No    Family History  Problem Relation Age of Onset  . Diabetes Mother     type 2  . Leukemia Father   . COPD Brother   . Cancer Sister    FATHER: Deceased .Also had pneumonia and gout MOTHER: Deceased . Diabetes mellitus, myocardial infarction, pulmonary embolus SIBLINGS: One brother living and well, a half brother had CABG, another half brother living, sister Adonis Housekeeper 09/07/2002 from a cancer.  CHILDREN: 2 sons living and well  Review of Systems  DATA OBTAINED: from patient, nurse, medical record, family member GENERAL: Feels well no fevers, fatigue, appetite  changes SKIN: No itching, rash or wounds EYES: No eye pain, redness, discharge EARS: No earache, tinnitus, change in hearing NOSE: No congestion, drainage or bleeding  MOUTH/THROAT: No mouth or tooth pain, No sore throat, No difficulty chewing or swallowing  RESPIRATORY: No wheezing, SOB. Persistent intermittent hacking cough CARDIAC: No chest pain, palpitations, lower extremity edema  GI: No abdominal pain, No N/V/D or constipation, No heartburn or reflux . Mild dysphagia. GU: Increased frequency. Some urgency. Nocturia x3.  MUSCULOSKELETAL: No unrelieved bone/joint pain. Generalized weakness and muscular atrophy. Unsteady gait. Bilateral foot drop. NEUROLOGIC: Awake, alert, appropriate to situation, No change in mental status. Moves all four, no focal deficits. Generalized weakness from neuromuscular disease. Bilateral foot drop. Bilateral peripheral neuropathy. PSYCHIATRIC: No overt anxiety or sadness. Sleeps well. No behavior issue.  AMBULATION:    Filed Vitals:   04/28/13 1426  BP: 130/60  Pulse: 73  Temp: 97.4 F (36.3 C)  Resp: 16    Physical Exam  GENERAL APPEARANCE: Alert, conversant. Appropriately groomed. No acute distress.  SKIN: No diaphoresis rash, or wounds HEAD: Normocephalic, atraumatic  EYES: Conjunctiva/lids clear. Pupils round, reactive. EOMs intact. Corrective lenses worn. EARS: External exam WNL, canals clear. Hearing grossly normal.  NOSE: No deformity or discharge.  MOUTH/THROAT: Lips w/o lesions. Mouth and throat normal. Tongue moist, w/o lesion.  NECK: No thyroid tenderness, enlargement or nodule  RESPIRATORY: Breathing is even, unlabored. Lung sounds are clear   CARDIOVASCULAR: Heart RRR no murmurs, rubs or gallops. No peripheral edema.  ARTERIAL: radial pulse 2+, DP pulse 1+  VENOUS: No varicosities. No venous stasis skin changes  GASTROINTESTINAL: Abdomen is soft, non-tender, not distended w/ normal bowel sounds. No mass, ventral or inguinal hernia.  No organomegally GENITOURINARY: Bladder non tender, not distended  RECTAL: Normal sphincter tone. Normal sized prostate. Stool is heme negative. MUSCULOSKELETAL: No abnormal joints or musculature. Severe interosseous muscular wasting of both hands. Generalized muscular wasting of both upper and lower extremities. Very unsteady gait. Uses a cane or walker at all times. At risk for falls. Takes short steps and drags both feet. NEUROLOGIC: Oriented X3. Cranial nerves 2-12 grossly intact. Moves all extremities. No tremor. Reduced vibratory sensation in both lower extremities. Bilateral foot drop for which he wears ankle-foot orthosis bilaterally. Appendectomy in the median nerves of both hands causes some discomfort. PSYCHIATRIC: Mood and affect appropriate to situation, no behavioral issues. Mild anxiety.    Functional assessment: Patient requires the assistance of his wife when bathing, but otherwise remains independent in all ADL.  CBC    Component Value Date/Time   WBC 7.8 09/04/2012 1426   RBC 3.94* 09/04/2012 1426   HGB 12.8* 09/04/2012  1426   HCT 39.1 09/04/2012 1426   PLT 180 09/04/2012 1426   MCV 99.2 09/04/2012 1426   LYMPHSABS 2.9 09/04/2012 1426   MONOABS 0.5 09/04/2012 1426   EOSABS 0.3 09/04/2012 1426   BASOSABS 0.0 09/04/2012 1426    CMP     Component Value Date/Time   NA 139 09/04/2012 1426   K 4.4 09/04/2012 1426   CL 102 09/04/2012 1426   CO2 28 09/04/2012 1426   GLUCOSE 92 09/04/2012 1426   BUN 38* 09/04/2012 1426   CREATININE 1.64* 09/04/2012 1426   CALCIUM 9.4 09/04/2012 1426   PROT 5.9* 08/05/2011 0500   ALBUMIN 2.6* 08/05/2011 0500   AST 18 08/05/2011 0500   ALT 14 08/05/2011 0500   ALKPHOS 54 08/05/2011 0500   BILITOT 0.1* 08/05/2011 0500   GFRNONAA 38* 09/04/2012 1426   GFRAA 44* 09/04/2012 1426   LABS REVIEWED 06/26/2010 BMP: glucose 85, BUN 31, Creatinine 1.53 PSA 7.4 Vitamin D 34.5 10/06/2010 CMP: glucose 78, BUN 29, Creatinine 1.27 PSA 6.2 04/05/2011 CBC: WBC 5.8, RBC  3.82, Hemoglobin 12.5 CMP: GLucose 89, BUN 46, Creatinine 1.97, Potassium 5.3 Lipid: Cholesterol 224, Triglycerides 69, HDL 58, VLDL 14, LDL 152 PSA: 7.6 Vit. D: 41.9 08/05/2011  ( [facility]) CBC: Wbc 7.6, Rbc 3.39, Hgb 11.1, Hct 34.2, Platelet 131 BMP: glucose 94, BUN 38, Creatinine 1.59 CK: 201, CKMB: 4.2 08/07/2011 CBC: Rbc 3.44, Hgb 10.8, Hct 34.0, Platelet 171 glucose 94, BUN 19, Creatinine 1.19 Vitamin B12: 916, Folate 1699 04/02/2012 CBC: Rbc 4.07, Hgb 12.9, Hct 40.0 CMP: glucose 77, BUN 38, Creatinine 1.64, Potassium 5.3 Lipid: cholesterol 253, triglycerides 75, HDL 55, LDL 183 Urine Microalbumin 26.7 Uric Acid 8.8   04/21/13 BMP; glu 83, BUN 37, Creat 1.64   Lipid: TC 229, trig 96, HDL 54, LDL 156   PSA 10.2  Assessment and Plan Polyneuropathy in other diseases classified elsewhere: Chronic condition which is gradually getting worse over the last 10 years.  CVA (cerebral infarction): Frontal lobe. Seeing Dr. Rock Nephew.  Cerebrovascular disease: Generalized small vessel disease  PSA elevation: Last value 10.2. History of BPH, today's exam shows a fairly normal-sized prostate   - Plan: PSA  CKD (chronic kidney disease), stage III: Stable  Abnormality of gait: High risk for falls. Compensates with the use of walker or cane. Now using a powered chair.  Hyperlipidemia: Slightly high LDL.  Spinal stenosis, lumbar region, with neurogenic claudication, chronic back discomfort. This contributes some to his gait instability.   Jemina Scahill, Viviann Spare, MD

## 2013-05-03 DIAGNOSIS — E785 Hyperlipidemia, unspecified: Secondary | ICD-10-CM | POA: Insufficient documentation

## 2013-05-03 DIAGNOSIS — G63 Polyneuropathy in diseases classified elsewhere: Secondary | ICD-10-CM | POA: Insufficient documentation

## 2013-05-03 DIAGNOSIS — M48062 Spinal stenosis, lumbar region with neurogenic claudication: Secondary | ICD-10-CM | POA: Insufficient documentation

## 2013-05-04 ENCOUNTER — Encounter: Payer: Medicare Other | Admitting: Physical Therapy

## 2013-05-04 ENCOUNTER — Inpatient Hospital Stay (HOSPITAL_COMMUNITY)
Admission: EM | Admit: 2013-05-04 | Discharge: 2013-05-09 | DRG: 481 | Disposition: A | Discharge status: Skilled Nursing Facility | Payer: Medicare Other | Attending: Internal Medicine | Admitting: Internal Medicine

## 2013-05-04 ENCOUNTER — Emergency Department (HOSPITAL_COMMUNITY): Payer: Medicare Other

## 2013-05-04 ENCOUNTER — Encounter (HOSPITAL_COMMUNITY): Payer: Self-pay | Admitting: Emergency Medicine

## 2013-05-04 DIAGNOSIS — Z7902 Long term (current) use of antithrombotics/antiplatelets: Secondary | ICD-10-CM

## 2013-05-04 DIAGNOSIS — S7290XA Unspecified fracture of unspecified femur, initial encounter for closed fracture: Secondary | ICD-10-CM

## 2013-05-04 DIAGNOSIS — Z87891 Personal history of nicotine dependence: Secondary | ICD-10-CM

## 2013-05-04 DIAGNOSIS — R269 Unspecified abnormalities of gait and mobility: Secondary | ICD-10-CM | POA: Diagnosis present

## 2013-05-04 DIAGNOSIS — S72002A Fracture of unspecified part of neck of left femur, initial encounter for closed fracture: Secondary | ICD-10-CM

## 2013-05-04 DIAGNOSIS — S7292XA Unspecified fracture of left femur, initial encounter for closed fracture: Secondary | ICD-10-CM

## 2013-05-04 DIAGNOSIS — I639 Cerebral infarction, unspecified: Secondary | ICD-10-CM | POA: Diagnosis present

## 2013-05-04 DIAGNOSIS — M109 Gout, unspecified: Secondary | ICD-10-CM | POA: Diagnosis present

## 2013-05-04 DIAGNOSIS — I129 Hypertensive chronic kidney disease with stage 1 through stage 4 chronic kidney disease, or unspecified chronic kidney disease: Secondary | ICD-10-CM | POA: Diagnosis present

## 2013-05-04 DIAGNOSIS — Y9229 Other specified public building as the place of occurrence of the external cause: Secondary | ICD-10-CM

## 2013-05-04 DIAGNOSIS — Z833 Family history of diabetes mellitus: Secondary | ICD-10-CM

## 2013-05-04 DIAGNOSIS — R972 Elevated prostate specific antigen [PSA]: Secondary | ICD-10-CM

## 2013-05-04 DIAGNOSIS — N183 Chronic kidney disease, stage 3 unspecified: Secondary | ICD-10-CM | POA: Diagnosis present

## 2013-05-04 DIAGNOSIS — H02409 Unspecified ptosis of unspecified eyelid: Secondary | ICD-10-CM

## 2013-05-04 DIAGNOSIS — Z886 Allergy status to analgesic agent status: Secondary | ICD-10-CM

## 2013-05-04 DIAGNOSIS — N179 Acute kidney failure, unspecified: Secondary | ICD-10-CM | POA: Diagnosis not present

## 2013-05-04 DIAGNOSIS — K5900 Constipation, unspecified: Secondary | ICD-10-CM | POA: Diagnosis not present

## 2013-05-04 DIAGNOSIS — N401 Enlarged prostate with lower urinary tract symptoms: Secondary | ICD-10-CM | POA: Diagnosis present

## 2013-05-04 DIAGNOSIS — Z79899 Other long term (current) drug therapy: Secondary | ICD-10-CM

## 2013-05-04 DIAGNOSIS — H3530 Unspecified macular degeneration: Secondary | ICD-10-CM | POA: Diagnosis present

## 2013-05-04 DIAGNOSIS — T4275XA Adverse effect of unspecified antiepileptic and sedative-hypnotic drugs, initial encounter: Secondary | ICD-10-CM | POA: Diagnosis not present

## 2013-05-04 DIAGNOSIS — I679 Cerebrovascular disease, unspecified: Secondary | ICD-10-CM

## 2013-05-04 DIAGNOSIS — W010XXA Fall on same level from slipping, tripping and stumbling without subsequent striking against object, initial encounter: Secondary | ICD-10-CM | POA: Diagnosis present

## 2013-05-04 DIAGNOSIS — G252 Other specified forms of tremor: Secondary | ICD-10-CM | POA: Diagnosis present

## 2013-05-04 DIAGNOSIS — G63 Polyneuropathy in diseases classified elsewhere: Secondary | ICD-10-CM | POA: Diagnosis present

## 2013-05-04 DIAGNOSIS — R339 Retention of urine, unspecified: Secondary | ICD-10-CM | POA: Diagnosis present

## 2013-05-04 DIAGNOSIS — M1990 Unspecified osteoarthritis, unspecified site: Secondary | ICD-10-CM | POA: Diagnosis present

## 2013-05-04 DIAGNOSIS — E785 Hyperlipidemia, unspecified: Secondary | ICD-10-CM | POA: Diagnosis present

## 2013-05-04 DIAGNOSIS — Z806 Family history of leukemia: Secondary | ICD-10-CM

## 2013-05-04 DIAGNOSIS — G609 Hereditary and idiopathic neuropathy, unspecified: Secondary | ICD-10-CM | POA: Diagnosis present

## 2013-05-04 DIAGNOSIS — M216X9 Other acquired deformities of unspecified foot: Secondary | ICD-10-CM | POA: Diagnosis present

## 2013-05-04 DIAGNOSIS — Z836 Family history of other diseases of the respiratory system: Secondary | ICD-10-CM

## 2013-05-04 DIAGNOSIS — S7292XK Unspecified fracture of left femur, subsequent encounter for closed fracture with nonunion: Secondary | ICD-10-CM

## 2013-05-04 DIAGNOSIS — S72143A Displaced intertrochanteric fracture of unspecified femur, initial encounter for closed fracture: Principal | ICD-10-CM | POA: Diagnosis present

## 2013-05-04 DIAGNOSIS — G250 Essential tremor: Secondary | ICD-10-CM | POA: Diagnosis present

## 2013-05-04 DIAGNOSIS — N139 Obstructive and reflux uropathy, unspecified: Secondary | ICD-10-CM | POA: Diagnosis present

## 2013-05-04 DIAGNOSIS — M48062 Spinal stenosis, lumbar region with neurogenic claudication: Secondary | ICD-10-CM | POA: Diagnosis present

## 2013-05-04 DIAGNOSIS — Z8673 Personal history of transient ischemic attack (TIA), and cerebral infarction without residual deficits: Secondary | ICD-10-CM

## 2013-05-04 DIAGNOSIS — K219 Gastro-esophageal reflux disease without esophagitis: Secondary | ICD-10-CM | POA: Diagnosis present

## 2013-05-04 DIAGNOSIS — K5730 Diverticulosis of large intestine without perforation or abscess without bleeding: Secondary | ICD-10-CM | POA: Diagnosis present

## 2013-05-04 DIAGNOSIS — N138 Other obstructive and reflux uropathy: Secondary | ICD-10-CM | POA: Diagnosis present

## 2013-05-04 DIAGNOSIS — N400 Enlarged prostate without lower urinary tract symptoms: Secondary | ICD-10-CM

## 2013-05-04 DIAGNOSIS — E559 Vitamin D deficiency, unspecified: Secondary | ICD-10-CM | POA: Diagnosis present

## 2013-05-04 DIAGNOSIS — H5702 Anisocoria: Secondary | ICD-10-CM | POA: Diagnosis present

## 2013-05-04 LAB — COMPREHENSIVE METABOLIC PANEL
ALT: 23 U/L (ref 0–53)
AST: 24 U/L (ref 0–37)
CO2: 25 mEq/L (ref 19–32)
Calcium: 9.7 mg/dL (ref 8.4–10.5)
Creatinine, Ser: 1.59 mg/dL — ABNORMAL HIGH (ref 0.50–1.35)
GFR calc Af Amer: 45 mL/min — ABNORMAL LOW (ref >90)
GFR calc non Af Amer: 39 mL/min — ABNORMAL LOW (ref >90)
Glucose, Bld: 108 mg/dL — ABNORMAL HIGH (ref 70–99)
Sodium: 137 mEq/L (ref 135–145)
Total Protein: 6.9 g/dL (ref 6.0–8.3)

## 2013-05-04 LAB — CBC WITH DIFFERENTIAL/PLATELET
Basophils Absolute: 0.1 10*3/uL (ref 0.0–0.1)
Eosinophils Absolute: 0.2 10*3/uL (ref 0.0–0.7)
Eosinophils Relative: 2 % (ref 0–5)
HCT: 38.9 % — ABNORMAL LOW (ref 39.0–52.0)
Lymphocytes Relative: 20 % (ref 12–46)
MCH: 34 pg (ref 26.0–34.0)
MCHC: 34.2 g/dL (ref 30.0–36.0)
MCV: 99.5 fL (ref 78.0–100.0)
Monocytes Absolute: 0.4 10*3/uL (ref 0.1–1.0)
Platelets: 133 10*3/uL — ABNORMAL LOW (ref 150–400)
RDW: 15.3 % (ref 11.5–15.5)

## 2013-05-04 LAB — PROTIME-INR
INR: 0.98 (ref 0.00–1.49)
INR: 0.99 (ref 0.00–1.49)

## 2013-05-04 LAB — APTT: aPTT: 25 seconds (ref 24–37)

## 2013-05-04 LAB — TROPONIN I: Troponin I: <0.3 ng/mL (ref <0.30)

## 2013-05-04 MED ORDER — ENALAPRIL MALEATE 5 MG PO TABS
5.0000 mg | ORAL_TABLET | Freq: Every day | ORAL | Status: DC
  Administered 2013-05-05 – 2013-05-06 (×2): 5 mg via ORAL
  Filled 2013-05-04 (×2): qty 1

## 2013-05-04 MED ORDER — MORPHINE SULFATE 2 MG/ML IJ SOLN: 2.0000 mg | INTRAMUSCULAR | Status: DC | PRN

## 2013-05-04 MED ORDER — FENTANYL CITRATE 0.05 MG/ML IJ SOLN
100.0000 ug | Freq: Once | INTRAMUSCULAR | Status: AC
  Administered 2013-05-04: 100 ug via INTRAVENOUS
  Filled 2013-05-04: qty 2

## 2013-05-04 MED ORDER — FENTANYL CITRATE 0.05 MG/ML IJ SOLN
50.0000 ug | Freq: Once | INTRAMUSCULAR | Status: AC
  Administered 2013-05-04: 50 ug via INTRAVENOUS
  Filled 2013-05-04: qty 2

## 2013-05-04 MED ORDER — ADULT MULTIVITAMIN W/MINERALS CH
1.0000 | ORAL_TABLET | Freq: Every day | ORAL | Status: DC
  Administered 2013-05-04 – 2013-05-09 (×5): 1 via ORAL
  Filled 2013-05-04 (×6): qty 1

## 2013-05-04 MED ORDER — SODIUM CHLORIDE 0.9 % IJ SOLN
3.0000 mL | Freq: Two times a day (BID) | INTRAMUSCULAR | Status: DC
  Administered 2013-05-04 – 2013-05-09 (×6): 3 mL via INTRAVENOUS

## 2013-05-04 MED ORDER — BISACODYL 10 MG RE SUPP
10.0000 mg | Freq: Every day | RECTAL | Status: DC | PRN
  Administered 2013-05-08: 10 mg via RECTAL
  Filled 2013-05-04: qty 1

## 2013-05-04 MED ORDER — GUAIFENESIN-DM 100-10 MG/5ML PO SYRP
5.0000 mL | ORAL_SOLUTION | ORAL | Status: DC | PRN
  Filled 2013-05-04: qty 5

## 2013-05-04 MED ORDER — HYDROCODONE-ACETAMINOPHEN 5-325 MG PO TABS
1.0000 | ORAL_TABLET | ORAL | Status: DC | PRN
  Administered 2013-05-05 – 2013-05-08 (×11): 2 via ORAL
  Filled 2013-05-04 (×12): qty 2

## 2013-05-04 MED ORDER — POTASSIUM CHLORIDE IN NACL 20-0.9 MEQ/L-% IV SOLN
INTRAVENOUS | Status: DC
  Administered 2013-05-04: 21:00:00 via INTRAVENOUS
  Filled 2013-05-04 (×2): qty 1000

## 2013-05-04 MED ORDER — ONDANSETRON HCL 4 MG PO TABS: 4.0000 mg | ORAL_TABLET | Freq: Four times a day (QID) | ORAL | Status: DC | PRN

## 2013-05-04 MED ORDER — PANTOPRAZOLE SODIUM 40 MG PO TBEC
40.0000 mg | DELAYED_RELEASE_TABLET | Freq: Every day | ORAL | Status: DC
  Administered 2013-05-04 – 2013-05-09 (×6): 40 mg via ORAL
  Filled 2013-05-04 (×5): qty 1

## 2013-05-04 MED ORDER — ALBUTEROL SULFATE (5 MG/ML) 0.5% IN NEBU: 2.5000 mg | INHALATION_SOLUTION | RESPIRATORY_TRACT | Status: DC | PRN

## 2013-05-04 MED ORDER — VITAMIN D3 25 MCG (1000 UNIT) PO TABS
2000.0000 [IU] | ORAL_TABLET | Freq: Every day | ORAL | Status: DC
  Administered 2013-05-04 – 2013-05-09 (×6): 2000 [IU] via ORAL
  Filled 2013-05-04 (×6): qty 2

## 2013-05-04 MED ORDER — TAMSULOSIN HCL 0.4 MG PO CAPS
0.4000 mg | ORAL_CAPSULE | Freq: Every day | ORAL | Status: DC
  Administered 2013-05-04 – 2013-05-09 (×6): 0.4 mg via ORAL
  Filled 2013-05-04 (×6): qty 1

## 2013-05-04 MED ORDER — METOPROLOL TARTRATE 1 MG/ML IV SOLN
5.0000 mg | INTRAVENOUS | Status: DC | PRN
  Filled 2013-05-04: qty 5

## 2013-05-04 MED ORDER — ONDANSETRON HCL 4 MG/2ML IJ SOLN: 4.0000 mg | Freq: Four times a day (QID) | INTRAMUSCULAR | Status: DC | PRN

## 2013-05-04 MED ORDER — DOCUSATE SODIUM 100 MG PO CAPS
100.0000 mg | ORAL_CAPSULE | Freq: Two times a day (BID) | ORAL | Status: DC
  Administered 2013-05-04 – 2013-05-09 (×9): 100 mg via ORAL
  Filled 2013-05-04 (×10): qty 1

## 2013-05-04 MED ORDER — ALLOPURINOL 100 MG PO TABS
100.0000 mg | ORAL_TABLET | Freq: Two times a day (BID) | ORAL | Status: DC
  Administered 2013-05-04 – 2013-05-09 (×10): 100 mg via ORAL
  Filled 2013-05-04 (×11): qty 1

## 2013-05-04 NOTE — ED Provider Notes (Signed)
CSN: [id number]     Arrival date & time 05/04/13  1611 History     First MD Initiated Contact with Patient 05/04/13 1616     Chief Complaint  Patient presents with  . Hip Pain    left   (Consider location/radiation/quality/duration/timing/severity/associated sxs/prior Treatment) HPI Pt with trip and fall from standing landing on left hip. +pain to left hip. No head or neck injury. Pt with peripheral neuropathy and history of TIA. Pt takes plavix.  Past Medical History  Diagnosis Date  . Gout     takes Allopurinol daily  . Peripheral neuropathy   . Enlarged prostate     takes Tamsulosin daily  . Hypertension     takes Enalapril daily   . Hyperlipidemia     but doesn't require meds  . Pneumonia     hx of;last time a yr ago  . Peripheral neuropathy   . TIA (transient ischemic attack)     sees Dr.Willis-neurologist  . Urinary frequency   . Urinary urgency   . Macular degeneration     takes eye cap vits daily  . Foot drop, bilateral   . Gait disorder   . Degenerative arthritis   . Ptosis     Left side  . Chronic kidney disease, stage III (moderate)   . Inguinal hernia without mention of obstruction or gangrene, unilateral or unspecified, (not specified as recurrent)   . Other abnormal blood chemistry   . Spinal stenosis, lumbar region, with neurogenic claudication   . Unspecified vitamin D deficiency   . Essential and other specified forms of tremor   . Abnormality of gait   . Gout, unspecified   . Encounter for long-term (current) use of other medications   . Internal hemorrhoids without mention of complication   . Benign neoplasm of colon   . Diverticulosis of colon (without mention of hemorrhage)   . Duodenal ulcer, unspecified as acute or chronic, without hemorrhage, perforation, or obstruction    Past Surgical History  Procedure Laterality Date  . Hernia repair      at least 77yrs ago  . Left leg surgery  2011  . Colonosocpy    . Inguinal hernia repair   09/05/2012    Procedure: LAPAROSCOPIC INGUINAL HERNIA;  Surgeon: Gayland Curry, MD,FACS;  Location: [facility];  Service: General;  Laterality: Left;  . Insertion of mesh  09/05/2012    Procedure: INSERTION OF MESH;  Surgeon: Gayland Curry, MD,FACS;  Location: [facility];  Service: General;  Laterality: Left;  . Finger surgery Left     Left ring finger  . Nerve biopsy     Family History  Problem Relation Age of Onset  . Diabetes Mother     type 2  . Leukemia Father   . COPD Brother   . Cancer Sister    History  Substance Use Topics  . 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 Smokeless tobacco: Not on file     Comment: 25 yrs ago  . Alcohol Use: No    Review of Systems  Constitutional: Negative for fever and chills.  HENT: Negative for neck pain.   Respiratory: Negative for shortness of breath.   Cardiovascular: Negative for chest pain.  Gastrointestinal: Negative for nausea, vomiting and abdominal pain.  Musculoskeletal: Positive for arthralgias.  Skin: Negative for rash and wound.  Neurological: Positive for numbness (due to ongoing peripheral neuropathy). Negative for dizziness, weakness, light-headedness and headaches.  All other systems reviewed and are negative.  Allergies  Aleve; Nsaids; and Indocin  Home Medications   Current Outpatient Rx  Name  Route  Sig  Dispense  Refill  . allopurinol (ZYLOPRIM) 100 MG tablet   Oral   Take 100 mg by mouth 2 (two) times daily.          Marland Kitchen b complex vitamins capsule   Oral   Take 1 capsule by mouth daily.           . cholecalciferol (VITAMIN D) 1000 UNITS tablet   Oral   Take 2,000 Units by mouth daily.          . clopidogrel (PLAVIX) 75 MG tablet   Oral   Take 75 mg by mouth daily.         . enalapril (VASOTEC) 5 MG tablet   Oral   Take 5 mg by mouth daily.         . Multiple Vitamins-Minerals (MULTIVITAMINS THER. W/MINERALS) TABS   Oral   Take 1 tablet by mouth daily.           Marland Kitchen omeprazole (PRILOSEC) 20 MG  capsule   Oral   Take 20 mg by mouth daily. For reflux         . OVER THE COUNTER MEDICATION   Topical   Apply 1 application topically at bedtime as needed. Phys cream. For neuropathy pain         . Tamsulosin HCl (FLOMAX) 0.4 MG CAPS   Oral   Take 0.4 mg by mouth daily.            BP 136/87  Pulse 84  Temp(Src) 98 F (36.7 C) (Oral)  Resp 15  Wt 155 lb (70.308 kg)  BMI 22.88 kg/m2  SpO2 96% Physical Exam  Nursing note and vitals reviewed. Constitutional: He is oriented to person, place, and time. He appears well-developed and well-nourished. No distress.  HENT:  Head: Normocephalic and atraumatic.  Mouth/Throat: Oropharynx is clear and moist.  Eyes: EOM are normal. Pupils are equal, round, and reactive to light.  Neck: Normal range of motion. Neck supple.  No posterior cervical tenderness  Cardiovascular: Normal rate and regular rhythm.   Pulmonary/Chest: Effort normal and breath sounds normal. No respiratory distress. He has no wheezes. He has no rales. He exhibits no tenderness.  Abdominal: Soft. Bowel sounds are normal. He exhibits no distension and no mass. There is no tenderness. There is no rebound and no guarding.  Musculoskeletal: He exhibits tenderness. He exhibits no edema.  L hip tenderness with palpation. Limited ROM L hip due to pain. L leg is shortened and externally rotated.   Neurological: He is alert and oriented to person, place, and time.  Good bl grip strength. Difficult to assess motor in LLE due to pain. Decreased sensation bl lower ext, otherwise intact.    Skin: Skin is warm and dry. No rash noted. No erythema.  Psychiatric: He has a normal mood and affect. His behavior is normal.    ED Course   Procedures (including critical care time)  Labs Reviewed  CBC WITH DIFFERENTIAL - Abnormal; Notable for the following:    RBC 3.91 (*)    HCT 38.9 (*)    Platelets 133 (*)    All other components within normal limits  COMPREHENSIVE METABOLIC  PANEL - Abnormal; Notable for the following:    Glucose, Bld 108 (*)    BUN 36 (*)    Creatinine, Ser 1.59 (*)    GFR calc non  Af Amer 39 (*)    GFR calc Af Amer 45 (*)    All other components within normal limits  PROTIME-INR  TROPONIN I  APTT   Dg Chest 1 View  05/04/2013   *RADIOLOGY REPORT*  Clinical Data: Fall, left hip pain  CHEST - 1 VIEW  Comparison: 09/04/2012  Findings: Cardiomediastinal silhouette is stable.  No acute infiltrate or pleural effusion.  No pulmonary edema. Atherosclerotic calcifications of thoracic aorta again noted. Stable degenerative changes thoracic spine.  IMPRESSION: No active disease.  No significant change.   Original Report Authenticated By: Lahoma Crocker, M.D.   Dg Hip Complete Left  05/04/2013   *RADIOLOGY REPORT*  Clinical Data: Hip pain status post fall.  LEFT HIP - COMPLETE 2+ VIEW  Comparison: None.  Findings: The bones appear only mildly demineralized.  However, there is an acute nondisplaced intertrochanteric left femur fracture. Mild left and moderate right hip degenerative changes are noted.  There is no evidence of acute pelvic fracture or dislocation.  IMPRESSION: Nondisplaced intertrochanteric left femur fracture.   Original Report Authenticated By: Richardean Sale, M.D.   1. Abnormality of gait   2. Anisocoria   3. Cerebrovascular disease   4. PSA elevation   5. Spinal stenosis, lumbar region, with neurogenic claudication   6. Closed left femoral fracture, initial encounter   7. Closed left hip fracture, initial encounter     Date: 05/04/2013  Rate: 74  Rhythm: normal sinus rhythm  QRS Axis: normal  Intervals: normal  ST/T Wave abnormalities: normal  Conduction Disutrbances:none  Narrative Interpretation:   Old EKG Reviewed: unchanged   MDM  Discussed with Dr Percell Miller. Will see in AM for possible surgery tomorrow or Wednesday. Advised NPO at midnight and hold plavix.  [facility] to admit.   Julianne Rice, MD 05/04/13 [phone]

## 2013-05-04 NOTE — Consult Note (Signed)
ORTHOPAEDIC CONSULTATION  REQUESTING PHYSICIAN: Thurnell Lose, MD  Chief Complaint: L hip pain  HPI: Drew Gibson is a 79 y.o. male who complains of  L hip fracture after a standing fall. He has a h/o neuropothy(idiopathic) and requires walker and AFO's. Community/household ambulator  Past Medical History  Diagnosis Date  . Gout     takes Allopurinol daily  . Peripheral neuropathy   . Enlarged prostate     takes Tamsulosin daily  . Hypertension     takes Enalapril daily   . Hyperlipidemia     but doesn't require meds  . Pneumonia     hx of;last time a yr ago  . Peripheral neuropathy   . TIA (transient ischemic attack)     sees Dr.Willis-neurologist  . Urinary frequency   . Urinary urgency   . Macular degeneration     takes eye cap vits daily  . Foot drop, bilateral   . Gait disorder   . Degenerative arthritis   . Ptosis     Left side  . Chronic kidney disease, stage III (moderate)   . Inguinal hernia without mention of obstruction or gangrene, unilateral or unspecified, (not specified as recurrent)   . Other abnormal blood chemistry   . Spinal stenosis, lumbar region, with neurogenic claudication   . Unspecified vitamin D deficiency   . Essential and other specified forms of tremor   . Abnormality of gait   . Gout, unspecified   . Encounter for long-term (current) use of other medications   . Internal hemorrhoids without mention of complication   . Benign neoplasm of colon   . Diverticulosis of colon (without mention of hemorrhage)   . Duodenal ulcer, unspecified as acute or chronic, without hemorrhage, perforation, or obstruction    Past Surgical History  Procedure Laterality Date  . Hernia repair      at least 24yrs ago  . Left leg surgery  2011  . Colonosocpy    . Inguinal hernia repair  09/05/2012    Procedure: LAPAROSCOPIC INGUINAL HERNIA;  Surgeon: Gayland Curry, MD,FACS;  Location: [facility];  Service: General;  Laterality: Left;  . Insertion of  mesh  09/05/2012    Procedure: INSERTION OF MESH;  Surgeon: Gayland Curry, MD,FACS;  Location: [facility];  Service: General;  Laterality: Left;  . Finger surgery Left     Left ring finger  . Nerve biopsy     History   Social History  . Marital Status: Married    Spouse Name: N/A    Number of Children: 2  . Years of Education: 12   Occupational History  . Retired    Social History Main Topics  . 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 Smokeless tobacco: None     Comment: 25 yrs ago  . Alcohol Use: No  . Drug Use: No  . Sexually Active: Not Currently   Other Topics Concern  . None   Social History Narrative  . None   Family History  Problem Relation Age of Onset  . Diabetes Mother     type 2  . Leukemia Father   . COPD Brother   . Cancer Sister    Allergies  Allergen Reactions  . Aleve (Naproxen Sodium)   . Nsaids   . Indocin (Indomethacin)    Prior to Admission medications   Medication Sig Start Date End Date Taking? Authorizing Provider  allopurinol (ZYLOPRIM) 100 MG tablet Take 100 mg by  mouth 2 (two) times daily.    Yes Historical Provider, MD  b complex vitamins capsule Take 1 capsule by mouth daily.     Yes Historical Provider, MD  cholecalciferol (VITAMIN D) 1000 UNITS tablet Take 2,000 Units by mouth daily.    Yes Historical Provider, MD  clopidogrel (PLAVIX) 75 MG tablet Take 75 mg by mouth daily.   Yes Historical Provider, MD  enalapril (VASOTEC) 5 MG tablet Take 5 mg by mouth daily.   Yes Historical Provider, MD  Multiple Vitamins-Minerals (MULTIVITAMINS THER. W/MINERALS) TABS Take 1 tablet by mouth daily.     Yes Historical Provider, MD  omeprazole (PRILOSEC) 20 MG capsule Take 20 mg by mouth daily. For reflux   Yes Historical Provider, MD  OVER THE COUNTER MEDICATION Apply 1 application topically at bedtime as needed. Phys cream. For neuropathy pain   Yes Historical Provider, MD  Tamsulosin HCl (FLOMAX) 0.4 MG CAPS Take 0.4 mg by mouth daily.     Yes Historical  Provider, MD   Dg Chest 1 View  05/04/2013   *RADIOLOGY REPORT*  Clinical Data: Fall, left hip pain  CHEST - 1 VIEW  Comparison: 09/04/2012  Findings: Cardiomediastinal silhouette is stable.  No acute infiltrate or pleural effusion.  No pulmonary edema. Atherosclerotic calcifications of thoracic aorta again noted. Stable degenerative changes thoracic spine.  IMPRESSION: No active disease.  No significant change.   Original Report Authenticated By: Lahoma Crocker, M.D.   Dg Hip Complete Left  05/04/2013   *RADIOLOGY REPORT*  Clinical Data: Hip pain status post fall.  LEFT HIP - COMPLETE 2+ VIEW  Comparison: None.  Findings: The bones appear only mildly demineralized.  However, there is an acute nondisplaced intertrochanteric left femur fracture. Mild left and moderate right hip degenerative changes are noted.  There is no evidence of acute pelvic fracture or dislocation.  IMPRESSION: Nondisplaced intertrochanteric left femur fracture.   Original Report Authenticated By: Richardean Sale, M.D.    Positive ROS: All other systems have been reviewed and were otherwise negative with the exception of those mentioned in the HPI and as above.  Physical Exam: Filed Vitals:   05/04/13 1955  BP: 158/64  Pulse: 81  Temp: 98.8 F (37.1 C)  Resp: 18   General: Alert, no acute distress Cardiovascular: No pedal edema Respiratory: No cyanosis, no use of accessory musculature GI: No organomegaly, abdomen is soft and non-tender Skin: No lesions in the area of chief complaint Neurologic: Sensation intact distally Psychiatric: Patient is competent for consent with normal mood and affect Lymphatic: No axillary or cervical lymphadenopathy  MUSCULOSKELETAL:  LLE: pain with ROM. Skin intact. SILT DP/SP/S/S/T, no motor to foot is baseline RLE: Attraumatic no motor at baseline BUE: atraumatic  Assessment: L hip fracture  Plan: Plan for Operative fixation tomorrow am. -NPO at midnight -Medicine team to admit and  perform pre-op clearance -Type and Cross for 2 units held for OR -PT/OT post op -will ammend WB status postop, bedrest for now -Foley for comfort to be removed POD 1/2 -Likely to require Rehab or SNF placement upon discharge.  Weight Bearing Status: bedrest to wbat post op PT VTE px: SCD's and Plavix post op   Edmonia Lynch, D, MD Cell [phone]   05/04/2013 9:20 PM

## 2013-05-04 NOTE — H&P (Signed)
[facility]                                                                                    Patient Demographics  [name], is a 79 y.o. male  CSN: [id number]  MRN: [id number]  DOB - 06/29/1929  Admit Date - 05/04/2013  Outpatient Primary MD for the patient is GREEN, Viviann Spare, MD   With History of -  Past Medical History  Diagnosis Date  . Gout     takes Allopurinol daily  . Peripheral neuropathy   . Enlarged prostate     takes Tamsulosin daily  . Hypertension     takes Enalapril daily   . Hyperlipidemia     but doesn't require meds  . Pneumonia     hx of;last time a yr ago  . Peripheral neuropathy   . TIA (transient ischemic attack)     sees Dr.Willis-neurologist  . Urinary frequency   . Urinary urgency   . Macular degeneration     takes eye cap vits daily  . Foot drop, bilateral   . Gait disorder   . Degenerative arthritis   . Ptosis     Left side  . Chronic kidney disease, stage III (moderate)   . Inguinal hernia without mention of obstruction or gangrene, unilateral or unspecified, (not specified as recurrent)   . Other abnormal blood chemistry   . Spinal stenosis, lumbar region, with neurogenic claudication   . Unspecified vitamin D deficiency   . Essential and other specified forms of tremor   . Abnormality of gait   . Gout, unspecified   . Encounter for long-term (current) use of other medications   . Internal hemorrhoids without mention of complication   . Benign neoplasm of colon   . Diverticulosis of colon (without mention of hemorrhage)   . Duodenal ulcer, unspecified as acute or chronic, without hemorrhage, perforation, or obstruction       Past Surgical History  Procedure Laterality Date  . Hernia repair      at least 51yrs ago  . Left leg surgery  2011  . Colonosocpy    . Inguinal hernia repair  09/05/2012    Procedure: LAPAROSCOPIC INGUINAL HERNIA;  Surgeon: Gayland Curry, MD,FACS;  Location: [facility];  Service: General;   Laterality: Left;  . Insertion of mesh  09/05/2012    Procedure: INSERTION OF MESH;  Surgeon: Gayland Curry, MD,FACS;  Location: [facility];  Service: General;  Laterality: Left;  . Finger surgery Left     Left ring finger  . Nerve biopsy      in for   Chief Complaint  Patient presents with  . Hip Pain    left     HPI  Drew Gibson  is a 79 y.o. male, with history of poor gait, chronic peripheral neuropathy with bilateral foot drop, BPH, CVA in the past with no residual deficits, GERD, gout, hypertension, dyslipidemia, chronic kidney disease stage III baseline creatinine 1.6, who was at [facility] and happen to trip due to loss of balance, he fell on his left hip, he experienced sharp nonradiating left hip pain which was constant, worse by  bearing weight better with rest, no other associated symptoms, came to the ER where x-rays revealed left intertrochanteric femoral fracture, ER physician called me for admission, he has a showed me that he will talk to the orthopedic physician before the patient leaves the floor. Apparently he has patient the orthopedic physician.    Review of Systems    In addition to the HPI above,   No Fever-chills, No Headache, No changes with Vision or hearing, No problems swallowing food or Liquids, No Chest pain, Cough or Shortness of Breath, No Abdominal pain, No Nausea or Vommitting, Bowel movements are regular, No Blood in stool or Urine, No dysuria, No new skin rashes or bruises, No new joints pains-aches, except left hip as above No new weakness, tingling, numbness in any extremity, No recent weight gain or loss, No polyuria, polydypsia or polyphagia, No significant Mental Stressors.  A full 10 point Review of Systems was done, except as stated above, all other Review of Systems were negative.   Social History History  Substance Use Topics  . 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 Smokeless tobacco: Not on file     Comment: 25 yrs ago  . Alcohol Use: No       Family History Family History  Problem Relation Age of Onset  . Diabetes Mother     type 2  . Leukemia Father   . COPD Brother   . Cancer Sister       Prior to Admission medications   Medication Sig Start Date End Date Taking? Authorizing Provider  allopurinol (ZYLOPRIM) 100 MG tablet Take 100 mg by mouth 2 (two) times daily.    Yes Historical Provider, MD  b complex vitamins capsule Take 1 capsule by mouth daily.     Yes Historical Provider, MD  cholecalciferol (VITAMIN D) 1000 UNITS tablet Take 2,000 Units by mouth daily.    Yes Historical Provider, MD  clopidogrel (PLAVIX) 75 MG tablet Take 75 mg by mouth daily.   Yes Historical Provider, MD  enalapril (VASOTEC) 5 MG tablet Take 5 mg by mouth daily.   Yes Historical Provider, MD  Multiple Vitamins-Minerals (MULTIVITAMINS THER. W/MINERALS) TABS Take 1 tablet by mouth daily.     Yes Historical Provider, MD  omeprazole (PRILOSEC) 20 MG capsule Take 20 mg by mouth daily. For reflux   Yes Historical Provider, MD  OVER THE COUNTER MEDICATION Apply 1 application topically at bedtime as needed. Phys cream. For neuropathy pain   Yes Historical Provider, MD  Tamsulosin HCl (FLOMAX) 0.4 MG CAPS Take 0.4 mg by mouth daily.     Yes Historical Provider, MD    Allergies  Allergen Reactions  . Aleve (Naproxen Sodium)   . Nsaids   . Indocin (Indomethacin)     Physical Exam  Vitals  Blood pressure 136/87, pulse 84, temperature 98 F (36.7 C), temperature source Oral, resp. rate 15, weight 70.308 kg (155 lb), SpO2 96.00%.   1. General elderly white male lying in bed in NAD,     2. Normal affect and insight, Not Suicidal or Homicidal, Awake Alert, Oriented X 3.  3. No F.N deficits, ALL C.Nerves Intact, Strength 5/5 all 4 extremities, Sensation intact all 4 extremities, Plantars down going.  4. Ears and Eyes appear Normal, Conjunctivae clear, PERRLA. Left pupil chronically slightly larger than right, Moist Oral Mucosa.  5.  Supple Neck, No JVD, No cervical lymphadenopathy appriciated, No Carotid Bruits.  6. Symmetrical Chest wall movement, Good air movement bilaterally, CTAB.  7. RRR, No Gallops, Rubs or Murmurs, No Parasternal Heave.  8. Positive Bowel Sounds, Abdomen Soft, Non tender, No organomegaly appriciated,No rebound -guarding or rigidity.  9.  No Cyanosis, Normal Skin Turgor, No Skin Rash or Bruise.  10. Good muscle tone,  joints appear normal , no effusions, Normal ROM. Left hip is externally rotated, he is chronic for drop in both legs.  11. No Palpable Lymph Nodes in Neck or Axillae     Data Review  CBC  Recent Labs Lab 05/04/13 1745  WBC 8.3  HGB 13.3  HCT 38.9*  PLT 133*  MCV 99.5  MCH 34.0  MCHC 34.2  RDW 15.3  LYMPHSABS 1.7  MONOABS 0.4  EOSABS 0.2  BASOSABS 0.1   ------------------------------------------------------------------------------------------------------------------  Chemistries   Recent Labs Lab 05/04/13 1745  NA 137  K 4.6  CL 102  CO2 25  GLUCOSE 108*  BUN 36*  CREATININE 1.59*  CALCIUM 9.7  AST 24  ALT 23  ALKPHOS 70  BILITOT 0.3   ------------------------------------------------------------------------------------------------------------------ CrCl is unknown because both a height and weight (above a minimum accepted value) are required for this calculation. ------------------------------------------------------------------------------------------------------------------ No results found for this basename: TSH, T4TOTAL, FREET3, T3FREE, THYROIDAB,  in the last 72 hours   Coagulation profile  Recent Labs Lab 05/04/13 1745  INR 0.98   ------------------------------------------------------------------------------------------------------------------- No results found for this basename: DDIMER,  in the last 72 hours -------------------------------------------------------------------------------------------------------------------  Cardiac  Enzymes  Recent Labs Lab 05/04/13 1745  TROPONINI <0.30   ------------------------------------------------------------------------------------------------------------------ No components found with this basename: POCBNP,    ---------------------------------------------------------------------------------------------------------------  Urinalysis    Component Value Date/Time   COLORURINE YELLOW 08/04/2011 [place] 08/04/2011 0952   LABSPEC 1.014 08/04/2011 [place] 5.0 08/04/2011 [place] 08/04/2011 0952   HGBUR SMALL* 08/04/2011 [place] 08/04/2011 [place] 08/04/2011 [place] 08/04/2011 0952   UROBILINOGEN 0.2 08/04/2011 0952   NITRITE NEGATIVE 08/04/2011 [place] 08/04/2011 0952    ----------------------------------------------------------------------------------------------------------------  Imaging results:   Dg Chest 1 View  05/04/2013   *RADIOLOGY REPORT*  Clinical Data: Fall, left hip pain  CHEST - 1 VIEW  Comparison: 09/04/2012  Findings: Cardiomediastinal silhouette is stable.  No acute infiltrate or pleural effusion.  No pulmonary edema. Atherosclerotic calcifications of thoracic aorta again noted. Stable degenerative changes thoracic spine.  IMPRESSION: No active disease.  No significant change.   Original Report Authenticated By: Lahoma Crocker, M.D.   Dg Hip Complete Left  05/04/2013   *RADIOLOGY REPORT*  Clinical Data: Hip pain status post fall.  LEFT HIP - COMPLETE 2+ VIEW  Comparison: None.  Findings: The bones appear only mildly demineralized.  However, there is an acute nondisplaced intertrochanteric left femur fracture. Mild left and moderate right hip degenerative changes are noted.  There is no evidence of acute pelvic fracture or dislocation.  IMPRESSION: Nondisplaced intertrochanteric left femur fracture.   Original Report Authenticated By: Richardean Sale, M.D.     My personal review of EKG: Rhythm NSR, Rate  74 /min, no Acute ST changes    Assessment & Plan  Principal Problem:   Closed left femoral fracture Active Problems:   Gout   CKD (chronic kidney disease), stage III   Abnormality of gait   Anisocoria   CVA (cerebral infarction)   Hyperlipidemia   Polyneuropathy in other diseases classified elsewhere   Spinal stenosis, lumbar region, with neurogenic claudication  1. Mechanical trip and fall with left intertrochanteric hip fracture.- ER M.D. to arrange for Orthopedics evaluation, patient will be admitted to a telemetry bed, n.p.o. after midnight, he has no chronic cardiac or pulmonary issues, he should be a moderate risk to adverse cardiopulmonary outcome.   Cardio-Pulm Risk stratification for surgery and recommendations to minimize the same:-  A.Cardio-Pulmonary Risk -  this patient is a moderate for adverse Cardio-Pulmonary  Outcome  from surgery, the risks and benefits were discussed and acceptable to patient, wife and son.  Recommendations for optimizing Cardio-Pulmonary  Risk risk factors  1. Keep SBP<140, HR<85, use Lopressor 5mg  IV q4hrs PRN, or B.Blocker drip PRN. 2. Moniotr I&Os. 3. Minimal sedation and Narcotics. 4. Good pulmunary toilet. 5. PRN Nebs and as needed oxygen to keep Pox>90% 6. Hb>8, transfuse as needed- Lasix 10mg  IV after each unit PRBC Transfused.   B.Bleeding Risk - no previous surgical complications, no easy bruising,  he is on Plavix please note.     Lab Results  Component Value Date   PLT 133* 05/04/2013                  Lab Results  Component Value Date   INR 0.98 05/04/2013   INR 1.04 08/04/2011   INR 1.05 07/10/2010      Will request Surgeon to please Order Lovenox/DVT prophylaxis of his/her choice when OK from Surgeon's standpoint post op.    2. Hypertension - home medication Vasotec will be continued, when necessary IV Lopressor parameters has been also ordered.   3. BPH  continue Flomax.   4. GERD patient on PPI.   5. History of CVA. On Plavix, currently on hold, is to be resumed postop.   6. Chronic peripheral neuropathy with bilateral foot drop. Commence PT OT post surgery once okay by orthopedics. May need placement. He already has a walker at home.   7. History of gout on allopurinol which will be continued.    DVT Prophylaxis  SCDs   AM Labs Ordered, also please review Full Orders  Family Communication: Admission, patients condition and plan of care including tests being ordered have been discussed with the patient and family who indicate understanding and agree with the plan and Code Status.  Code Status full  Likely DC to  home  Time spent in minutes :35  Condition Jill Side K M.D on 05/04/2013 at 6:52 PM  Between 7am to 7pm - Pager - [phone]  After 7pm go to [email or web address] - password [email or web address]  And look for the night coverage person covering me after hours  [facility] Office  [phone]

## 2013-05-04 NOTE — ED Notes (Signed)
Orthopedic surgery at bedside

## 2013-05-04 NOTE — ED Notes (Signed)
Pt states he takes plavix.

## 2013-05-04 NOTE — Progress Notes (Signed)
Orthopedic Tech Progress Note Patient Details:  Drew Gibson 10/20/1928 [id number] Frame to be applied after surgery. Patient ID: Drew Gibson, male   DOB: 06/15/1929, 79 y.o.   MRN: [id number]   [name] 05/04/2013, 8:45 PM

## 2013-05-04 NOTE — ED Notes (Signed)
Pt was found at [facility] in [place] on the ground. Pt states he lost his balance and fell. Pt states he has neuropathy. Vitals 146/88, 74 HR, CBG 114. Pt states he did not hit his head or hurt anything else.

## 2013-05-05 ENCOUNTER — Encounter (HOSPITAL_COMMUNITY): Payer: Self-pay | Admitting: Anesthesiology

## 2013-05-05 ENCOUNTER — Encounter (HOSPITAL_COMMUNITY)
Admission: EM | Disposition: A | Discharge status: Skilled Nursing Facility | Payer: Self-pay | Source: Home / Self Care | Attending: Internal Medicine

## 2013-05-05 ENCOUNTER — Inpatient Hospital Stay (HOSPITAL_COMMUNITY): Payer: Medicare Other

## 2013-05-05 ENCOUNTER — Inpatient Hospital Stay (HOSPITAL_COMMUNITY): Payer: Medicare Other | Admitting: Anesthesiology

## 2013-05-05 HISTORY — PX: HIP PINNING,CANNULATED: SHX1758

## 2013-05-05 LAB — BASIC METABOLIC PANEL
Chloride: 106 mEq/L (ref 96–112)
GFR calc Af Amer: 46 mL/min — ABNORMAL LOW (ref >90)
Potassium: 5 mEq/L (ref 3.5–5.1)
Sodium: 139 mEq/L (ref 135–145)

## 2013-05-05 LAB — CBC
HCT: 37.4 % — ABNORMAL LOW (ref 39.0–52.0)
Hemoglobin: 12.2 g/dL — ABNORMAL LOW (ref 13.0–17.0)
WBC: 6.4 10*3/uL (ref 4.0–10.5)

## 2013-05-05 LAB — PREPARE RBC (CROSSMATCH)

## 2013-05-05 LAB — ABO/RH: ABO/RH(D): A POS

## 2013-05-05 SURGERY — FIXATION, FEMUR, NECK, PERCUTANEOUS, USING SCREW
Anesthesia: General | Site: Hip | Laterality: Left | Wound class: Clean

## 2013-05-05 MED ORDER — ARTIFICIAL TEARS OP OINT
TOPICAL_OINTMENT | OPHTHALMIC | Status: DC | PRN
  Administered 2013-05-05: 1 via OPHTHALMIC

## 2013-05-05 MED ORDER — BUPIVACAINE HCL (PF) 0.25 % IJ SOLN
INTRAMUSCULAR | Status: AC
  Filled 2013-05-05: qty 30

## 2013-05-05 MED ORDER — PROPOFOL 10 MG/ML IV BOLUS
INTRAVENOUS | Status: DC | PRN
  Administered 2013-05-05: 100 mg via INTRAVENOUS

## 2013-05-05 MED ORDER — ONDANSETRON HCL 4 MG/2ML IJ SOLN: 4.0000 mg | Freq: Once | INTRAMUSCULAR | Status: AC | PRN

## 2013-05-05 MED ORDER — CLOPIDOGREL BISULFATE 75 MG PO TABS
75.0000 mg | ORAL_TABLET | Freq: Every day | ORAL | Status: DC
  Administered 2013-05-06 – 2013-05-08 (×3): 75 mg via ORAL
  Filled 2013-05-05 (×4): qty 1

## 2013-05-05 MED ORDER — LIDOCAINE HCL (CARDIAC) 20 MG/ML IV SOLN
INTRAVENOUS | Status: DC | PRN
  Administered 2013-05-05: 90 mg via INTRAVENOUS

## 2013-05-05 MED ORDER — FENTANYL CITRATE 0.05 MG/ML IJ SOLN
INTRAMUSCULAR | Status: DC | PRN
  Administered 2013-05-05 (×3): 50 ug via INTRAVENOUS

## 2013-05-05 MED ORDER — CLOPIDOGREL BISULFATE 75 MG PO TABS
75.0000 mg | ORAL_TABLET | Freq: Every day | ORAL | Status: DC
Start: 2013-05-05 — End: 2013-05-05
  Filled 2013-05-05 (×2): qty 1

## 2013-05-05 MED ORDER — LIDOCAINE HCL 4 % MT SOLN
OROMUCOSAL | Status: DC | PRN
  Administered 2013-05-05: 4 mL via TOPICAL

## 2013-05-05 MED ORDER — 0.9 % SODIUM CHLORIDE (POUR BTL) OPTIME
TOPICAL | Status: DC | PRN
  Administered 2013-05-05: 1000 mL

## 2013-05-05 MED ORDER — ROCURONIUM BROMIDE 100 MG/10ML IV SOLN
INTRAVENOUS | Status: DC | PRN
  Administered 2013-05-05: 35 mg via INTRAVENOUS

## 2013-05-05 MED ORDER — LACTATED RINGERS IV SOLN
INTRAVENOUS | Status: DC | PRN
  Administered 2013-05-05: 08:00:00 via INTRAVENOUS

## 2013-05-05 MED ORDER — NEOSTIGMINE METHYLSULFATE 1 MG/ML IJ SOLN
INTRAMUSCULAR | Status: DC | PRN
  Administered 2013-05-05: 4 mg via INTRAVENOUS

## 2013-05-05 MED ORDER — PHENYLEPHRINE HCL 10 MG/ML IJ SOLN
INTRAMUSCULAR | Status: DC | PRN
  Administered 2013-05-05 (×2): 80 ug via INTRAVENOUS
  Administered 2013-05-05: 120 ug via INTRAVENOUS

## 2013-05-05 MED ORDER — ONDANSETRON HCL 4 MG/2ML IJ SOLN
INTRAMUSCULAR | Status: DC | PRN
  Administered 2013-05-05: 4 mg via INTRAVENOUS

## 2013-05-05 MED ORDER — HYDROMORPHONE HCL PF 1 MG/ML IJ SOLN
INTRAMUSCULAR | Status: AC
  Filled 2013-05-05: qty 1

## 2013-05-05 MED ORDER — HYDROMORPHONE HCL PF 1 MG/ML IJ SOLN
0.2500 mg | INTRAMUSCULAR | Status: DC | PRN
  Administered 2013-05-05: 0.5 mg via INTRAVENOUS

## 2013-05-05 MED ORDER — CEFAZOLIN SODIUM-DEXTROSE 2-3 GM-% IV SOLR
INTRAVENOUS | Status: AC
  Administered 2013-05-05: 2 g via INTRAVENOUS
  Filled 2013-05-05: qty 50

## 2013-05-05 MED ORDER — GLYCOPYRROLATE 0.2 MG/ML IJ SOLN
INTRAMUSCULAR | Status: DC | PRN
  Administered 2013-05-05: 0.6 mg via INTRAVENOUS

## 2013-05-05 MED ORDER — EPHEDRINE SULFATE 50 MG/ML IJ SOLN
INTRAMUSCULAR | Status: DC | PRN
  Administered 2013-05-05: 5 mg via INTRAVENOUS
  Administered 2013-05-05: 10 mg via INTRAVENOUS

## 2013-05-05 SURGICAL SUPPLY — 46 items
APL SKNCLS STERI-STRIP NONHPOA (GAUZE/BANDAGES/DRESSINGS)
BENZOIN TINCTURE PRP APPL 2/3 (GAUZE/BANDAGES/DRESSINGS) ×1 IMPLANT
BOOTCOVER CLEANROOM LRG (PROTECTIVE WEAR) ×2 IMPLANT
CLOTH BEACON ORANGE TIMEOUT ST (SAFETY) ×2 IMPLANT
COVER SURGICAL LIGHT HANDLE (MISCELLANEOUS) ×2 IMPLANT
DRAPE STERI IOBAN 125X83 (DRAPES) ×2 IMPLANT
DRSG EMULSION OIL 3X3 NADH (GAUZE/BANDAGES/DRESSINGS) ×1 IMPLANT
DRSG MEPILEX BORDER 4X4 (GAUZE/BANDAGES/DRESSINGS) ×2 IMPLANT
DRSG TEGADERM 4X4.75 (GAUZE/BANDAGES/DRESSINGS) ×1 IMPLANT
DURAPREP 26ML APPLICATOR (WOUND CARE) ×2 IMPLANT
ELECT REM PT RETURN 9FT ADLT (ELECTROSURGICAL) ×2
ELECTRODE REM PT RTRN 9FT ADLT (ELECTROSURGICAL) ×1 IMPLANT
FACESHIELD LNG OPTICON STERILE (SAFETY) ×3 IMPLANT
GLOVE BIOGEL PI IND STRL 8 (GLOVE) ×1 IMPLANT
GLOVE BIOGEL PI INDICATOR 8 (GLOVE) ×1
GLOVE ORTHO TXT STRL SZ7.5 (GLOVE) ×4 IMPLANT
GOWN PREVENTION PLUS XLARGE (GOWN DISPOSABLE) IMPLANT
GOWN STRL NON-REIN LRG LVL3 (GOWN DISPOSABLE) ×2 IMPLANT
GUIDE PIN THREADED (PIN) ×2
KIT BASIN OR (CUSTOM PROCEDURE TRAY) ×2 IMPLANT
KIT ROOM TURNOVER OR (KITS) ×2 IMPLANT
MANIFOLD NEPTUNE II (INSTRUMENTS) ×1 IMPLANT
NEEDLE 22X1 1/2 (OR ONLY) (NEEDLE) ×1 IMPLANT
NS IRRIG 1000ML POUR BTL (IV SOLUTION) ×2 IMPLANT
PACK GENERAL/GYN (CUSTOM PROCEDURE TRAY) ×2 IMPLANT
PAD ARMBOARD 7.5X6 YLW CONV (MISCELLANEOUS) ×5 IMPLANT
PIN GUIDE THREADED (PIN) IMPLANT
PLATE HIP KEYLESS 2H-135 (Plate) ×1 IMPLANT
SCREW CORTEX ST MATTA 4.5X40MM (Screw) ×1 IMPLANT
SCREW CORTEX ST MATTA 4.5X42MM (Screw) ×1 IMPLANT
SCREW OMEGA STD LAG (Screw) ×1 IMPLANT
SPONGE GAUZE 4X4 12PLY (GAUZE/BANDAGES/DRESSINGS) ×1 IMPLANT
STAPLER VISISTAT 35W (STAPLE) ×2 IMPLANT
STRIP CLOSURE SKIN 1/2X4 (GAUZE/BANDAGES/DRESSINGS) ×1 IMPLANT
SUT MON AB 2-0 CT1 36 (SUTURE) ×1 IMPLANT
SUT VIC AB 0 CT1 27 (SUTURE) ×2
SUT VIC AB 0 CT1 27XBRD ANBCTR (SUTURE) IMPLANT
SUT VIC AB 2-0 FS1 27 (SUTURE) ×2 IMPLANT
SUT VIC AB 2-0 SH 27 (SUTURE)
SUT VIC AB 2-0 SH 27XBRD (SUTURE) IMPLANT
SUT VIC AB 3-0 SH 27 (SUTURE)
SUT VIC AB 3-0 SH 27X BRD (SUTURE) IMPLANT
SYR CONTROL 10ML LL (SYRINGE) ×1 IMPLANT
TOWEL OR 17X24 6PK STRL BLUE (TOWEL DISPOSABLE) ×2 IMPLANT
TOWEL OR 17X26 10 PK STRL BLUE (TOWEL DISPOSABLE) ×2 IMPLANT
WATER STERILE IRR 1000ML POUR (IV SOLUTION) ×1 IMPLANT

## 2013-05-05 NOTE — Preoperative (Signed)
Beta Blockers   Reason not to administer Beta Blockers:Not Applicable 

## 2013-05-05 NOTE — Transfer of Care (Signed)
Immediate Anesthesia Transfer of Care Note  Patient: Drew Gibson  Procedure(s) Performed: Procedure(s): CANNULATED HIP PINNING- left (Left)  Patient Location: PACU  Anesthesia Type:General  Level of Consciousness: awake, alert  and oriented  Airway & Oxygen Therapy: Patient Spontanous Breathing and Patient connected to nasal cannula oxygen  Post-op Assessment: Report given to PACU RN and Post -op Vital signs reviewed and stable  Post vital signs: Reviewed and stable  Complications: No apparent anesthesia complications

## 2013-05-05 NOTE — Progress Notes (Signed)
Utilization Review Completed.Donne Anon T8/01/2013

## 2013-05-05 NOTE — Progress Notes (Signed)
[facility]                                                                                Patient Demographics  Drew Gibson, is a 79 y.o. male, DOB - 1929-04-09, [id number], [id number]  Admit date - 05/04/2013  Admitting Physician Thurnell Lose, MD  Outpatient Primary MD for the patient is GREEN, Viviann Spare, MD  LOS - 1   Chief Complaint  Patient presents with  . Hip Pain    left        Assessment & Plan    1. Mechanical trip and fall with left intertrochanteric hip fracture.- post ORIF by Ortho 05-05-13, tolerated the procedure well, PT, weight bearing and DVT prophylaxis per ortho, will call SNF, likely will need SNF.   2. Hypertension - home medication Vasotec will be continued, when necessary IV Lopressor parameters has been also ordered.    3. BPH continue Flomax.    4. GERD patient on PPI.    5. History of CVA. On Plavix, stable.   6. Chronic peripheral neuropathy with bilateral foot drop. Commence PT OT post surgery once okay by orthopedics. May need placement. He already has a walker at home.  .  7. History of gout on allopurinol which will be continued.    8.CKD 3 - (creat 1.6) - stable, monitor.     Code Status: Full  Family Communication: family bedside  Disposition Plan: TBD   Procedures ORIF L Femure 05-05-13   Consults Ortho   DVT Prophylaxis  Plavix - SCDs  Per Ortho  Lab Results  Component Value Date   PLT 133* 05/05/2013    Medications  Scheduled Meds: . allopurinol  100 mg Oral BID  . cholecalciferol  2,000 Units Oral Daily  . clopidogrel  75 mg Oral Q breakfast  . docusate sodium  100 mg Oral BID  . enalapril  5 mg Oral Daily  . HYDROmorphone      . multivitamin with minerals  1 tablet Oral Daily  . pantoprazole  40 mg Oral Daily  . sodium chloride  3 mL Intravenous Q12H  . tamsulosin  0.4 mg Oral Daily   Continuous Infusions:  PRN Meds:.albuterol, bisacodyl, guaiFENesin-dextromethorphan,  HYDROcodone-acetaminophen, HYDROmorphone (DILAUDID) injection, metoprolol, ondansetron (ZOFRAN) IV, ondansetron (ZOFRAN) IV, ondansetron  Antibiotics     Anti-infectives   Start     Dose/Rate Route Frequency Ordered Stop   05/05/13 0721  ceFAZolin (ANCEF) 2-3 GM-% IVPB SOLR    Comments:  HYPES, KAREN: cabinet override      05/05/13 0721 05/05/13 0745       Time Spent in minutes   35   [name] K M.D on 05/05/2013 at 12:21 PM  Between 7am to 7pm - Pager - [phone]  After 7pm go to [email or web address] - password [email or web address]  And look for the night coverage person covering for me after hours  [facility] Office  [phone]    Subjective:   Drew Gibson today has, No headache, No chest pain, No abdominal pain - No Nausea, No new weakness tingling or numbness, No Cough - SOB. Mild L hip pain  Objective:   Filed Vitals:   05/05/13  1015 05/05/13 1022 05/05/13 1023 05/05/13 1100  BP:    147/74  Pulse: 72 68  72  Temp:   98.1 F (36.7 C) 97.6 F (36.4 C)  TempSrc:      Resp: 16 13    Height:      Weight:      SpO2: 99% 99%  97%    Wt Readings from Last 3 Encounters:  05/05/13 72.757 kg (160 lb 6.4 oz)  05/05/13 72.757 kg (160 lb 6.4 oz)  04/28/13 71.215 kg (157 lb)     Intake/Output Summary (Last 24 hours) at 05/05/13 1221 Last data filed at 05/05/13 1024  Gross per 24 hour  Intake    900 ml  Output    550 ml  Net    350 ml    Exam Awake Alert, Oriented X 3, No new F.N deficits, Normal affect [place].AT,PERRAL Supple Neck,No JVD, No cervical lymphadenopathy appriciated.  Symmetrical Chest wall movement, Good air movement bilaterally, CTAB RRR,No Gallops,Rubs or new Murmurs, No Parasternal Heave +ve B.Sounds, Abd Soft, Non tender, No organomegaly appriciated, No rebound - guarding or rigidity. No Cyanosis, Clubbing or edema, No new Rash or bruise     Data Review   Micro Results Recent Results (from the past 240 hour(s))  SURGICAL PCR SCREEN      Status: None   Collection Time    05/04/13 10:30 PM      Result Value Range Status   MRSA, PCR NEGATIVE  NEGATIVE Final   Staphylococcus aureus NEGATIVE  NEGATIVE Final   Comment:            The Xpert SA Assay (FDA     approved for NASAL specimens     in patients over 37 years of age),     is one component of     a comprehensive surveillance     program.  Test performance has     been validated by [employer] for patients greater     than or equal to 23 year old.     It is not intended     to diagnose infection nor to     guide or monitor treatment.    Radiology Reports Dg Chest 1 View  05/04/2013   *RADIOLOGY REPORT*  Clinical Data: Fall, left hip pain  CHEST - 1 VIEW  Comparison: 09/04/2012  Findings: Cardiomediastinal silhouette is stable.  No acute infiltrate or pleural effusion.  No pulmonary edema. Atherosclerotic calcifications of thoracic aorta again noted. Stable degenerative changes thoracic spine.  IMPRESSION: No active disease.  No significant change.   Original Report Authenticated By: Lahoma Crocker, M.D.   Dg Hip Complete Left  05/04/2013   *RADIOLOGY REPORT*  Clinical Data: Hip pain status post fall.  LEFT HIP - COMPLETE 2+ VIEW  Comparison: None.  Findings: The bones appear only mildly demineralized.  However, there is an acute nondisplaced intertrochanteric left femur fracture. Mild left and moderate right hip degenerative changes are noted.  There is no evidence of acute pelvic fracture or dislocation.  IMPRESSION: Nondisplaced intertrochanteric left femur fracture.   Original Report Authenticated By: Richardean Sale, M.D.   Dg Hip Operative Left  05/05/2013   *RADIOLOGY REPORT*  Clinical Data: ORIF intertrochanteric left femoral neck fracture.  OPERATIVE LEFT HIP 2 VIEWS  Comparison: Left hip x-rays yesterday.  Findings: 3 spot images from the C-arm fluoroscopic device, AP and lateral views of the left hip were submitted for interpretation post-operatively.  Anatomic  alignment post ORIF.  No visible complicating features.  IMPRESSION: Anatomic alignment post ORIF left intertrochanteric femoral neck fracture without acute complicating features.   Original Report Authenticated By: Evangeline Dakin, M.D.    CBC  Recent Labs Lab 05/04/13 1745 05/05/13 0515  WBC 8.3 6.4  HGB 13.3 12.2*  HCT 38.9* 37.4*  PLT 133* 133*  MCV 99.5 100.3*  MCH 34.0 32.7  MCHC 34.2 32.6  RDW 15.3 15.2  LYMPHSABS 1.7  --   MONOABS 0.4  --   EOSABS 0.2  --   BASOSABS 0.1  --     Chemistries   Recent Labs Lab 05/04/13 1745 05/05/13 0515  NA 137 139  K 4.6 5.0  CL 102 106  CO2 25 25  GLUCOSE 108* 107*  BUN 36* 32*  CREATININE 1.59* 1.57*  CALCIUM 9.7 9.4  AST 24  --   ALT 23  --   ALKPHOS 70  --   BILITOT 0.3  --    ------------------------------------------------------------------------------------------------------------------ estimated creatinine clearance is 38 ml/min (by C-G formula based on Cr of 1.57). ------------------------------------------------------------------------------------------------------------------ No results found for this basename: HGBA1C,  in the last 72 hours ------------------------------------------------------------------------------------------------------------------ No results found for this basename: CHOL, HDL, LDLCALC, TRIG, CHOLHDL, LDLDIRECT,  in the last 72 hours ------------------------------------------------------------------------------------------------------------------ No results found for this basename: TSH, T4TOTAL, FREET3, T3FREE, THYROIDAB,  in the last 72 hours ------------------------------------------------------------------------------------------------------------------ No results found for this basename: VITAMINB12, FOLATE, FERRITIN, TIBC, IRON, RETICCTPCT,  in the last 72 hours  Coagulation profile  Recent Labs Lab 05/04/13 1745 05/04/13 2030  INR 0.98 0.99    No results found for this  basename: DDIMER,  in the last 72 hours  Cardiac Enzymes  Recent Labs Lab 05/04/13 1745  TROPONINI <0.30   ------------------------------------------------------------------------------------------------------------------ No components found with this basename: POCBNP,

## 2013-05-05 NOTE — Interval H&P Note (Signed)
History and Physical Interval Note:  05/05/2013 7:27 AM  Drew Gibson  has presented today for surgery, with the diagnosis of Left Hip Fracture  The various methods of treatment have been discussed with the patient and family. After consideration of risks, benefits and other options for treatment, the patient has consented to  Procedure(s) with comments: CANNULATED HIP PINNING (Left) - Stryker DHS hip, Fracture table, Big Carm, Shower curtain. Surgeon available anytime will call in AM as a surgical intervention .  The patient's history has been reviewed, patient examined, no change in status, stable for surgery.  I have reviewed the patient's chart and labs.  Questions were answered to the patient's satisfaction.     Andie Mungin, D

## 2013-05-05 NOTE — Op Note (Signed)
05/04/2013 - 05/05/2013  9:10 AM  PATIENT:  Drew Gibson    PRE-OPERATIVE DIAGNOSIS:  Left Hip Fracture  POST-OPERATIVE DIAGNOSIS:  Same  PROCEDURE:  CANNULATED HIP PINNING- left  SURGEON:  Georgean Spainhower, D, MD  ASSISTANT: None  ANESTHESIA:   General  PREOPERATIVE INDICATIONS:  [name] is a  79 y.o. male who fell and was found to have a diagnosis of Left Hip Fracture who elected for surgical management.    The risks benefits and alternatives were discussed with the patient preoperatively including but not limited to the risks of infection, bleeding, nerve injury, cardiopulmonary complications, blood clots, malunion, nonunion, avascular necrosis, the need for revision surgery, the potential for conversion to hemiarthroplasty, among others, and the patient was willing to proceed.  OPERATIVE IMPLANTS: Stryker 2-hole Hole 135 degree Sliding hip screw  OPERATIVE FINDINGS: Intertrochanteric hip fracture with appropriate reduction  OPERATIVE PROCEDURE: The patient was brought to the operating room and placed in supine position, care was taken to pad bony prominences. IV antibiotics were given. General anesthesia administered.The patient was placed on the fracture table. The operative extremity was positioned and was prepped and draped in usual sterile fashion.  Time out was performed.  Incision was made distal to the greater trochanter, and the IT band was incised. The soft tissue was elevated off of the lateral femur and the 135 degree guide was placed.  Under orthoganol fluoroscopic guidance the Guide pin was placed to within 52mm of the Center center point in the head based on Tip-Apex distance measuring technique. Care was taken to not penetrate the subchondral bone.  Next the depth was measured and a 110 mm Lag screw was opened. The proximal femur was reamed under fluoroscopic guidance and the site was tapped.   The implant was then placed with a 2 - Hole side plate and [id number] lag  screws were placed in the shaft of the femur.   Final flouroscopic views demonstrated appropriate reduction and placement of hardware.  The wounds were irrigated copiously, and repaired with Vicryl/Monocryl, staples and sterile gauze. There no complications and the patient tolerated the procedure well.  The patient will be weightbearing as tolerated, and will be on plavix and SCD's for DVT prophylaxis.

## 2013-05-05 NOTE — Progress Notes (Signed)
Orthopedic Tech Progress Note Patient Details:  Drew Gibson 06/25/1929 [id number] Patient refused overhead frame. Patient ID: Drew Gibson, male   DOB: March 28, 1929, 79 y.o.   MRN: [id number]   [name] 05/05/2013, 6:36 PM

## 2013-05-05 NOTE — H&P (View-Only) (Signed)
ORTHOPAEDIC CONSULTATION  REQUESTING PHYSICIAN: Thurnell Lose, MD  Chief Complaint: L hip pain  HPI: Drew Gibson is a 79 y.o. male who complains of  L hip fracture after a standing fall. He has a h/o neuropothy(idiopathic) and requires walker and AFO's. Community/household ambulator  Past Medical History  Diagnosis Date  . Gout     takes Allopurinol daily  . Peripheral neuropathy   . Enlarged prostate     takes Tamsulosin daily  . Hypertension     takes Enalapril daily   . Hyperlipidemia     but doesn't require meds  . Pneumonia     hx of;last time a yr ago  . Peripheral neuropathy   . TIA (transient ischemic attack)     sees Dr.Willis-neurologist  . Urinary frequency   . Urinary urgency   . Macular degeneration     takes eye cap vits daily  . Foot drop, bilateral   . Gait disorder   . Degenerative arthritis   . Ptosis     Left side  . Chronic kidney disease, stage III (moderate)   . Inguinal hernia without mention of obstruction or gangrene, unilateral or unspecified, (not specified as recurrent)   . Other abnormal blood chemistry   . Spinal stenosis, lumbar region, with neurogenic claudication   . Unspecified vitamin D deficiency   . Essential and other specified forms of tremor   . Abnormality of gait   . Gout, unspecified   . Encounter for long-term (current) use of other medications   . Internal hemorrhoids without mention of complication   . Benign neoplasm of colon   . Diverticulosis of colon (without mention of hemorrhage)   . Duodenal ulcer, unspecified as acute or chronic, without hemorrhage, perforation, or obstruction    Past Surgical History  Procedure Laterality Date  . Hernia repair      at least 5yrs ago  . Left leg surgery  2011  . Colonosocpy    . Inguinal hernia repair  09/05/2012    Procedure: LAPAROSCOPIC INGUINAL HERNIA;  Surgeon: Gayland Curry, MD,FACS;  Location: [facility];  Service: General;  Laterality: Left;  . Insertion of  mesh  09/05/2012    Procedure: INSERTION OF MESH;  Surgeon: Gayland Curry, MD,FACS;  Location: [facility];  Service: General;  Laterality: Left;  . Finger surgery Left     Left ring finger  . Nerve biopsy     History   Social History  . Marital Status: Married    Spouse Name: N/A    Number of Children: 2  . Years of Education: 12   Occupational History  . Retired    Social History Main Topics  . 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 Smokeless tobacco: None     Comment: 25 yrs ago  . Alcohol Use: No  . Drug Use: No  . Sexually Active: Not Currently   Other Topics Concern  . None   Social History Narrative  . None   Family History  Problem Relation Age of Onset  . Diabetes Mother     type 2  . Leukemia Father   . COPD Brother   . Cancer Sister    Allergies  Allergen Reactions  . Aleve (Naproxen Sodium)   . Nsaids   . Indocin (Indomethacin)    Prior to Admission medications   Medication Sig Start Date End Date Taking? Authorizing Provider  allopurinol (ZYLOPRIM) 100 MG tablet Take 100 mg by  mouth 2 (two) times daily.    Yes Historical Provider, MD  b complex vitamins capsule Take 1 capsule by mouth daily.     Yes Historical Provider, MD  cholecalciferol (VITAMIN D) 1000 UNITS tablet Take 2,000 Units by mouth daily.    Yes Historical Provider, MD  clopidogrel (PLAVIX) 75 MG tablet Take 75 mg by mouth daily.   Yes Historical Provider, MD  enalapril (VASOTEC) 5 MG tablet Take 5 mg by mouth daily.   Yes Historical Provider, MD  Multiple Vitamins-Minerals (MULTIVITAMINS THER. W/MINERALS) TABS Take 1 tablet by mouth daily.     Yes Historical Provider, MD  omeprazole (PRILOSEC) 20 MG capsule Take 20 mg by mouth daily. For reflux   Yes Historical Provider, MD  OVER THE COUNTER MEDICATION Apply 1 application topically at bedtime as needed. Phys cream. For neuropathy pain   Yes Historical Provider, MD  Tamsulosin HCl (FLOMAX) 0.4 MG CAPS Take 0.4 mg by mouth daily.     Yes Historical  Provider, MD   Dg Chest 1 View  05/04/2013   *RADIOLOGY REPORT*  Clinical Data: Fall, left hip pain  CHEST - 1 VIEW  Comparison: 09/04/2012  Findings: Cardiomediastinal silhouette is stable.  No acute infiltrate or pleural effusion.  No pulmonary edema. Atherosclerotic calcifications of thoracic aorta again noted. Stable degenerative changes thoracic spine.  IMPRESSION: No active disease.  No significant change.   Original Report Authenticated By: Lahoma Crocker, M.D.   Dg Hip Complete Left  05/04/2013   *RADIOLOGY REPORT*  Clinical Data: Hip pain status post fall.  LEFT HIP - COMPLETE 2+ VIEW  Comparison: None.  Findings: The bones appear only mildly demineralized.  However, there is an acute nondisplaced intertrochanteric left femur fracture. Mild left and moderate right hip degenerative changes are noted.  There is no evidence of acute pelvic fracture or dislocation.  IMPRESSION: Nondisplaced intertrochanteric left femur fracture.   Original Report Authenticated By: Richardean Sale, M.D.    Positive ROS: All other systems have been reviewed and were otherwise negative with the exception of those mentioned in the HPI and as above.  Physical Exam: Filed Vitals:   05/04/13 1955  BP: 158/64  Pulse: 81  Temp: 98.8 F (37.1 C)  Resp: 18   General: Alert, no acute distress Cardiovascular: No pedal edema Respiratory: No cyanosis, no use of accessory musculature GI: No organomegaly, abdomen is soft and non-tender Skin: No lesions in the area of chief complaint Neurologic: Sensation intact distally Psychiatric: Patient is competent for consent with normal mood and affect Lymphatic: No axillary or cervical lymphadenopathy  MUSCULOSKELETAL:  LLE: pain with ROM. Skin intact. SILT DP/SP/S/S/T, no motor to foot is baseline RLE: Attraumatic no motor at baseline BUE: atraumatic  Assessment: L hip fracture  Plan: Plan for Operative fixation tomorrow am. -NPO at midnight -Medicine team to admit and  perform pre-op clearance -Type and Cross for 2 units held for OR -PT/OT post op -will ammend WB status postop, bedrest for now -Foley for comfort to be removed POD 1/2 -Likely to require Rehab or SNF placement upon discharge.  Weight Bearing Status: bedrest to wbat post op PT VTE px: SCD's and Plavix post op   Edmonia Lynch, D, MD Cell [phone]   05/04/2013 9:20 PM

## 2013-05-05 NOTE — Anesthesia Procedure Notes (Signed)
Procedure Name: Intubation Date/Time: 05/05/2013 7:43 AM Performed by: Erik Obey Pre-anesthesia Checklist: Patient identified, Timeout performed, Emergency Drugs available, Suction available and Patient being monitored Patient Re-evaluated:Patient Re-evaluated prior to inductionOxygen Delivery Method: Circle system utilized Preoxygenation: Pre-oxygenation with 100% oxygen Intubation Type: IV induction Ventilation: Mask ventilation without difficulty and Oral airway inserted - appropriate to patient size Laryngoscope Size: Mac and 4 Grade View: Grade II Tube type: Oral Tube size: 7.5 mm Number of attempts: 1 Airway Equipment and Method: Stylet and LTA kit utilized Placement Confirmation: breath sounds checked- equal and bilateral,  ETT inserted through vocal cords under direct vision and positive ETCO2 Secured at: 23 cm Tube secured with: Tape Dental Injury: Teeth and Oropharynx as per pre-operative assessment

## 2013-05-05 NOTE — Anesthesia Preprocedure Evaluation (Signed)
Anesthesia Evaluation  Patient identified by MRN, date of birth, ID band Patient awake    Reviewed: Allergy & Precautions, H&P , NPO status , Patient's Chart, lab work & pertinent test results  Airway Mallampati: I TM Distance: >3 FB Neck ROM: full    Dental   Pulmonary Current Smoker,          Cardiovascular hypertension, Rhythm:regular Rate:Normal     Neuro/Psych TIA Neuromuscular disease    GI/Hepatic PUD,   Endo/Other    Renal/GU CRF and Renal InsufficiencyRenal disease     Musculoskeletal   Abdominal   Peds  Hematology   Anesthesia Other Findings   Reproductive/Obstetrics                           Anesthesia Physical Anesthesia Plan  ASA: III  Anesthesia Plan: General   Post-op Pain Management:    Induction: Intravenous  Airway Management Planned: Oral ETT  Additional Equipment:   Intra-op Plan:   Post-operative Plan: Extubation in OR  Informed Consent: I have reviewed the patients History and Physical, chart, labs and discussed the procedure including the risks, benefits and alternatives for the proposed anesthesia with the patient or authorized representative who has indicated his/her understanding and acceptance.     Plan Discussed with: CRNA, Anesthesiologist and Surgeon  Anesthesia Plan Comments:         Anesthesia Quick Evaluation

## 2013-05-05 NOTE — Progress Notes (Signed)
PT Cancellation Note  Patient Details Name: Drew Gibson MRN: [id number] DOB: Aug 19, 1929   Cancelled Treatment:    Reason Eval/Treat Not Completed: Other (comment);Patient at procedure or test/unavailable OR today; will follow up for PT eval tomorrow  R[facility] 05/05/2013, 10:57 AM

## 2013-05-06 DIAGNOSIS — N183 Chronic kidney disease, stage 3 unspecified: Secondary | ICD-10-CM

## 2013-05-06 LAB — CBC
HCT: 36.3 % — ABNORMAL LOW (ref 39.0–52.0)
MCV: 100 fL (ref 78.0–100.0)
RBC: 3.63 MIL/uL — ABNORMAL LOW (ref 4.22–5.81)
WBC: 7.9 10*3/uL (ref 4.0–10.5)

## 2013-05-06 LAB — BASIC METABOLIC PANEL
BUN: 27 mg/dL — ABNORMAL HIGH (ref 6–23)
CO2: 26 mEq/L (ref 19–32)
Chloride: 102 mEq/L (ref 96–112)
Creatinine, Ser: 1.82 mg/dL — ABNORMAL HIGH (ref 0.50–1.35)

## 2013-05-06 MED ORDER — SODIUM CHLORIDE 0.9 % IV SOLN
INTRAVENOUS | Status: DC
  Administered 2013-05-06 – 2013-05-08 (×3): via INTRAVENOUS

## 2013-05-06 NOTE — Progress Notes (Signed)
Pt unable to void last night, I&O cath  A void as t 0030 this am yielded 1000 cc. No void as of 0620 today.

## 2013-05-06 NOTE — Evaluation (Signed)
Physical Therapy Evaluation Patient Details Name: Drew Gibson MRN: [id number] DOB: 1929-02-14 Today's Date: 05/06/2013 Time: [id number] PT Time Calculation (min): 21 min  PT Assessment / Plan / Recommendation History of Present Illness  Pt is an 79 y.o. male adm due to fall in [employer] parking lot. pt is s/p Lt hip cannulated hip pinning. Pt with PMH of a CVA, has bil LE foot drop due to idiopathic neuropathy. Pt reports he has faling ~4 times in last 34mo   Clinical Impression  Patient is s/p Lt hip pinning surgery resulting in functional limitations due to the deficits listed below (see PT Problem List).Patient will benefit from skilled PT to increase their independence and safety with mobility to allow discharge to the venue listed below.  Pt highly motivated to return home and refusing STSNF. Pt will be safe for D/C home if able to achieve transfers at supervision level to mod I (A) level bed <> power chair <> toilet. Wife cannot provide high level of (A) with transfers . Family and pt agreeable to goals and D/C plan.      PT Assessment  Patient needs continued PT services    Follow Up Recommendations  Home health PT;Supervision/Assistance - 24 hour;Other (comment) (at supervision to mod i for transfers )    Does the patient have the potential to tolerate intense rehabilitation      Barriers to Discharge        Equipment Recommendations  None recommended by PT    Recommendations for Other Services     Frequency Min 5X/week    Precautions / Restrictions Precautions Precautions: Fall Precaution Comments: recent falls  Required Braces or Orthoses: Other Brace/Splint Other Brace/Splint: bil AFOs for amb  Restrictions Weight Bearing Restrictions: Yes LLE Weight Bearing: Weight bearing as tolerated   Pertinent Vitals/Pain 10/10 with movement; 0/10 at rest       Mobility  Bed Mobility Bed Mobility: Supine to Sit;Sitting - Scoot to Edge of Bed Supine to Sit: 1: +2 Total  assist;HOB elevated;With rails Supine to Sit: Patient Percentage: 60% Sitting - Scoot to Edge of Bed: 3: Mod assist;With rail Details for Bed Mobility Assistance: pt required increased time for bed mobility due to pain; required facilitation to advance Lt LE to/off EOB and use of draw pad to bring trunk to upright sitting position; mod-max cues for hand placment and sequencing  Transfers Transfers: Stand to Sit;Sit to Stand;Stand Pivot Transfers Sit to Stand: 1: +2 Total assist;From bed;From elevated surface;With upper extremity assist Sit to Stand: Patient Percentage: 40% Stand to Sit: 1: +2 Total assist;To chair/3-in-1;With armrests;With upper extremity assist Stand to Sit: Patient Percentage: 40% Stand Pivot Transfers: 1: +2 Total assist;From elevated surface;With armrests Stand Pivot Transfers: Patient Percentage: 40% Details for Transfer Assistance: pt unable to achieve full upright standing position; demo fwd flex trunk at all times due to pain; required 2+ (A) to maintain balance and faciliate transfer; max cues for hand placement and sequencing; required (A) to block Lt LE with transfer  Ambulation/Gait Ambulation/Gait Assistance: Not tested (comment) (Will need bil AFOs to attempt ) Stairs: No Wheelchair Mobility Wheelchair Mobility: No         PT Diagnosis: Difficulty walking;Acute pain  PT Problem List: Decreased strength;Decreased range of motion;Decreased activity tolerance;Decreased balance;Decreased mobility;Decreased knowledge of use of DME;Pain PT Treatment Interventions: DME instruction;Gait training;Functional mobility training;Therapeutic activities;Therapeutic exercise;Balance training;Patient/family education;Neuromuscular re-education     PT Goals(Current goals can be found in the care plan section) Acute Rehab PT  Goals Patient Stated Goal: to go home PT Goal Formulation: With patient Time For Goal Achievement: 05/13/13 Potential to Achieve Goals: Good  Visit  Information  Last PT Received On: 05/06/13 Assistance Needed: +2 PT/OT Co-Evaluation/Treatment: Yes History of Present Illness: Pt is an 79 y.o. male adm due to fall in [employer] parking lot. pt is s/p Lt hip cannulated hip pinning. Pt with PMH of a CVA, has bil LE foot drop due to idiopathic neuropathy. Pt reports he has faling ~4 times in last 57mo        Prior [place] expects to be discharged to:: Private residence Living Arrangements: Spouse/significant other Available Help at Discharge: Family;Available 24 hours/day Type of Home: House Home Access: Ramped entrance Home Layout: Two level Alternate Level Stairs-Number of Steps: pt has electric chair lift   Home Equipment: Walker - 4 wheels;Walker - 2 wheels;Bedside commode;Shower seat;Grab bars - tub/shower;Other (comment);Toilet riser;Hand held shower head (stair lift chair; power scooter ) Additional Comments: Pt recently had progressed to amb with RW primarily; would use power scooter as needed; Pt has been NWB in past and was able to D/C home with bedroom in livingroom and was primarily power scooter <> bed <> BSC Prior Function Level of Independence: Independent with assistive device(s) Communication Communication: No difficulties Dominant Hand: Right    Cognition  Cognition Arousal/Alertness: Awake/alert Behavior During Therapy: WFL for tasks assessed/performed Overall Cognitive Status: Within Functional Limits for tasks assessed    Extremity/Trunk Assessment Upper Extremity Assessment Upper Extremity Assessment: Defer to OT evaluation Lower Extremity Assessment Lower Extremity Assessment: LLE deficits/detail;Generalized weakness;RLE deficits/detail RLE Deficits / Details: bil foot drop  RLE Sensation: history of peripheral neuropathy LLE: Unable to fully assess due to pain LLE Sensation: history of peripheral neuropathy Cervical / Trunk Assessment Cervical / Trunk Assessment: Kyphotic    Balance Balance Balance Assessed: Yes Static Sitting Balance Static Sitting - Balance Support: Bilateral upper extremity supported;Feet supported Static Sitting - Level of Assistance: 5: Stand by assistance  End of Session PT - End of Session Equipment Utilized During Treatment: Gait belt Activity Tolerance: Patient tolerated treatment well Patient left: in chair;with call bell/phone within reach;with family/visitor present Nurse Communication: Mobility status  GP     Gustavus Bryant, [place] 05/06/2013, 1:15 PM

## 2013-05-06 NOTE — Progress Notes (Signed)
    Subjective:  Patient reports pain as mild.    Objective:   VITALS:   Filed Vitals:   05/06/13 0800 05/06/13 1144 05/06/13 1253 05/06/13 1600  BP:   101/55   Pulse:   91   Temp:   97.5 F (36.4 C)   TempSrc:      Resp: 16 16 16 16   Height:      Weight:      SpO2:   93%     LLE: Dressing: C/D/I RLE: SILT DP/SP/S/S/T, 2+DP, no motor is baseline for him in BLE  LABS  Results for orders placed during the hospital encounter of 05/04/13 (from the past 24 hour(s))  CBC     Status: Abnormal   Collection Time    05/06/13  5:00 AM      Result Value Range   WBC 7.9  4.0 - 10.5 K/uL   RBC 3.63 (*) 4.22 - 5.81 MIL/uL   Hemoglobin 12.1 (*) 13.0 - 17.0 g/dL   HCT 36.3 (*) 39.0 - 52.0 %   MCV 100.0  78.0 - 100.0 fL   MCH 33.3  26.0 - 34.0 pg   MCHC 33.3  30.0 - 36.0 g/dL   RDW 15.3  11.5 - 15.5 %   Platelets 112 (*) 150 - 400 K/uL  BASIC METABOLIC PANEL     Status: Abnormal   Collection Time    05/06/13  5:00 AM      Result Value Range   Sodium 135  135 - 145 mEq/L   Potassium 4.7  3.5 - 5.1 mEq/L   Chloride 102  96 - 112 mEq/L   CO2 26  19 - 32 mEq/L   Glucose, Bld 128 (*) 70 - 99 mg/dL   BUN 27 (*) 6 - 23 mg/dL   Creatinine, Ser 1.82 (*) 0.50 - 1.35 mg/dL   Calcium 8.9  8.4 - 10.5 mg/dL   GFR calc non Af Amer 33 (*) >90 mL/min   GFR calc Af Amer 38 (*) >90 mL/min     Assessment/Plan: 1 Day Post-Op   Principal Problem:   Closed left femoral fracture Active Problems:   Gout   CKD (chronic kidney disease), stage III   Abnormality of gait   Anisocoria   CVA (cerebral infarction)   Hyperlipidemia   Polyneuropathy in other diseases classified elsewhere   Spinal stenosis, lumbar region, with neurogenic claudication   PLAN: WBAT LLE PT/OT Dressing change POD 3 Dispo per Social work VTE prophylaxis: Plavix/SCD/TED/Ambulation   Edmonia Lynch, D 05/06/2013, 7:03 PM   Edmonia Lynch, MD Cell [phone]

## 2013-05-06 NOTE — Anesthesia Postprocedure Evaluation (Signed)
  Anesthesia Post-op Note  Patient: Drew Gibson  Procedure(s) Performed: Procedure(s): CANNULATED HIP PINNING- left (Left)  Patient Location: PACU  Anesthesia Type:General  Level of Consciousness: awake, alert , oriented, patient cooperative and Patient remains intubated per anesthesia plan  Airway and Oxygen Therapy: Patient Spontanous Breathing  Post-op Pain: mild  Post-op Assessment: Post-op Vital signs reviewed, Patient's Cardiovascular Status Stable, Respiratory Function Stable, Patent Airway, No signs of Nausea or vomiting and Pain level controlled  Post-op Vital Signs: stable  Complications: No apparent anesthesia complications

## 2013-05-06 NOTE — Progress Notes (Signed)
[facility] PROGRESS NOTE  Drew Gibson [id number] DOB: 03-01-1929 DOA: 05/04/2013 PCP: [name], MD  Assessment/Plan: 1. Mechanical trip and fall with left intertrochanteric hip fracture. - post ORIF by Ortho 05-05-13, tolerated the procedure well, -await PT, weight bearing as per ortho  -continue pain management, follow  2. Hypertension - hold Vasotec for now due to cr trending up, continue when necessary IV Lopressor parameters  3. BPH continue Flomax.  4. GERD patient on PPI.  5. History of CVA. On Plavix, stable.  6. Chronic peripheral neuropathy with bilateral foot drop. PT/OT 7. History of gout on allopurinol which will be continued.  8. acute onCKD 3,  - (creat 1.6) - hold vasotec for today and follow.    Code Status: Full  Family Communication: family bedside  Disposition Plan: TBD    Procedures ORIF L Femure 05-05-13  Consults Ortho     HPI/Subjective: Pt sitting up in chair- states getting up with PT to chair for the first time today was painful, but he feels ok and wants to be able to go home with PT at DC- he states he has help and a ramp at home  Objective: Filed Vitals:   05/06/13 1144  BP:   Pulse:   Temp:   Resp: 16    Intake/Output Summary (Last 24 hours) at 05/06/13 1241 Last data filed at 05/06/13 0025  Gross per 24 hour  Intake    360 ml  Output   1000 ml  Net   -640 ml   Filed Weights   05/04/13 2100 05/05/13 0500 05/06/13 0531  Weight: 70.308 kg (155 lb) 72.757 kg (160 lb 6.4 oz) 70.308 kg (155 lb)    Exam:  General: alert & oriented x 3 In NAD Cardiovascular: RRR, nl S1 s2 Respiratory: CTAB Abdomen: soft +BS NT/ND, no masses palpable Extremities: No cyanosis and no edema   Data Reviewed: Basic Metabolic Panel:  Recent Labs Lab 05/04/13 1745 05/05/13 0515 05/06/13 0500  NA 137 139 135  K 4.6 5.0 4.7  CL 102 106 102  CO2 25 25 26   GLUCOSE 108* 107* 128*  BUN 36* 32* 27*  CREATININE 1.59* 1.57* 1.82*   CALCIUM 9.7 9.4 8.9   Liver Function Tests:  Recent Labs Lab 05/04/13 1745  AST 24  ALT 23  ALKPHOS 70  BILITOT 0.3  PROT 6.9  ALBUMIN 3.5   No results found for this basename: LIPASE, AMYLASE,  in the last 168 hours No results found for this basename: AMMONIA,  in the last 168 hours CBC:  Recent Labs Lab 05/04/13 1745 05/05/13 0515 05/06/13 0500  WBC 8.3 6.4 7.9  NEUTROABS 5.9  --   --   HGB 13.3 12.2* 12.1*  HCT 38.9* 37.4* 36.3*  MCV 99.5 100.3* 100.0  PLT 133* 133* 112*   Cardiac Enzymes:  Recent Labs Lab 05/04/13 1745  TROPONINI <0.30   BNP (last 3 results) No results found for this basename: PROBNP,  in the last 8760 hours CBG: No results found for this basename: GLUCAP,  in the last 168 hours  Recent Results (from the past 240 hour(s))  SURGICAL PCR SCREEN     Status: None   Collection Time    05/04/13 10:30 PM      Result Value Range Status   MRSA, PCR NEGATIVE  NEGATIVE Final   Staphylococcus aureus NEGATIVE  NEGATIVE Final   Comment:            The Xpert  SA Assay (FDA     approved for NASAL specimens     in patients over 13 years of age),     is one component of     a comprehensive surveillance     program.  Test performance has     been validated by [employer] for patients greater     than or equal to 21 year old.     It is not intended     to diagnose infection nor to     guide or monitor treatment.     Studies: Dg Chest 1 View  05/04/2013   *RADIOLOGY REPORT*  Clinical Data: Fall, left hip pain  CHEST - 1 VIEW  Comparison: 09/04/2012  Findings: Cardiomediastinal silhouette is stable.  No acute infiltrate or pleural effusion.  No pulmonary edema. Atherosclerotic calcifications of thoracic aorta again noted. Stable degenerative changes thoracic spine.  IMPRESSION: No active disease.  No significant change.   Original Report Authenticated By: Lahoma Crocker, M.D.   Dg Hip Complete Left  05/04/2013   *RADIOLOGY REPORT*  Clinical Data: Hip  pain status post fall.  LEFT HIP - COMPLETE 2+ VIEW  Comparison: None.  Findings: The bones appear only mildly demineralized.  However, there is an acute nondisplaced intertrochanteric left femur fracture. Mild left and moderate right hip degenerative changes are noted.  There is no evidence of acute pelvic fracture or dislocation.  IMPRESSION: Nondisplaced intertrochanteric left femur fracture.   Original Report Authenticated By: Richardean Sale, M.D.   Dg Hip Operative Left  05/05/2013   *RADIOLOGY REPORT*  Clinical Data: ORIF intertrochanteric left femoral neck fracture.  OPERATIVE LEFT HIP 2 VIEWS  Comparison: Left hip x-rays yesterday.  Findings: 3 spot images from the C-arm fluoroscopic device, AP and lateral views of the left hip were submitted for interpretation post-operatively.  Anatomic alignment post ORIF.  No visible complicating features.  IMPRESSION: Anatomic alignment post ORIF left intertrochanteric femoral neck fracture without acute complicating features.   Original Report Authenticated By: Evangeline Dakin, M.D.    Scheduled Meds: . allopurinol  100 mg Oral BID  . cholecalciferol  2,000 Units Oral Daily  . clopidogrel  75 mg Oral QHS  . docusate sodium  100 mg Oral BID  . enalapril  5 mg Oral Daily  . multivitamin with minerals  1 tablet Oral Daily  . pantoprazole  40 mg Oral Daily  . sodium chloride  3 mL Intravenous Q12H  . tamsulosin  0.4 mg Oral Daily   Continuous Infusions:   Principal Problem:   Closed left femoral fracture Active Problems:   Gout   CKD (chronic kidney disease), stage III   Abnormality of gait   Anisocoria   CVA (cerebral infarction)   Hyperlipidemia   Polyneuropathy in other diseases classified elsewhere   Spinal stenosis, lumbar region, with neurogenic claudication    Time spent: [facility] Pager [phone]. If 7PM-7AM, please contact night-coverage at [email or web address], password [facility] 05/06/2013, 12:41 PM  LOS:  2 days

## 2013-05-06 NOTE — Evaluation (Signed)
Occupational Therapy Evaluation Patient Details Name: AJAHNI BENZIGER MRN: [id number] DOB: Oct 20, 1928 Today's Date: 05/06/2013 Time: [id number] OT Time Calculation (min): 20 min  OT Assessment / Plan / Recommendation History of present illness Pt is an 79 y.o. male adm due to fall in [place] parking lot. pt is s/p Lt hip cannulated hip pinning. Pt with PMH of a CVA, has bil LE foot drop due to idiopathic neuropathy. Pt reports he has faling ~4 times in last 88mo    Clinical Impression    Patient is s/p Lt hip pinning surgery resulting in functional limitations due to the deficits listed below (see OT Problem List). Pt independent with ADLs (with assistive devices), PTA. Patient will benefit from skilled OT to increase their independence and safety to allow discharge to the venue listed below. Pt highly motivated to return home and refusing SNF for rehab.   OT Assessment  Patient needs continued OT Services    Follow Up Recommendations  Home health OT;Supervision/Assistance - 24 hour    Barriers to Discharge      Equipment Recommendations  None recommended by OT    Recommendations for Other Services    Frequency  Min 2X/week    Precautions / Restrictions Precautions Precautions: Fall Precaution Comments: recent falls  Required Braces or Orthoses: Other Brace/Splint Other Brace/Splint: bil AFOs for amb  Restrictions Weight Bearing Restrictions: Yes LLE Weight Bearing: Weight bearing as tolerated   Pertinent Vitals/Pain 10/10 pain with movement, but stated it decreased when at rest. Repositioned.    ADL  Eating/Feeding: Independent Where Assessed - Eating/Feeding: Chair Grooming: Set up Where Assessed - Grooming: Supported sitting Upper Body Bathing: Set up;Supervision/safety Where Assessed - Upper Body Bathing: Supported sitting Lower Body Bathing: +2 Total assistance Lower Body Bathing: Patient Percentage: 10% Where Assessed - Lower Body Bathing: Supported sit to  stand Upper Body Dressing: Supervision/safety;Set up Where Assessed - Upper Body Dressing: Supported sitting Lower Body Dressing: +2 Total assistance Lower Body Dressing: Patient Percentage: 0% Where Assessed - Lower Body Dressing: Supported sit to stand Toilet Transfer: Simulated;+2 Total assistance Toilet Transfer: Patient Percentage: 40% Armed forces technical officer Method: Arts development officer: Other (comment) (from elevated bed to recliner chair) Tub/Shower Transfer Method: Not assessed Equipment Used: Gait belt Transfers/Ambulation Related to ADLs: +2 total A (60%) for transfers. ADL Comments: Pt overall Total A for LB ADLs with sit to stand transfer.    OT Diagnosis: Acute pain  OT Problem List: Decreased strength;Decreased range of motion;Decreased activity tolerance;Impaired balance (sitting and/or standing);Decreased knowledge of precautions;Decreased knowledge of use of DME or AE;Pain OT Treatment Interventions: Self-care/ADL training;DME and/or AE instruction;Therapeutic activities;Balance training;Patient/family education   OT Goals(Current goals can be found in the care plan section) Acute Rehab OT Goals Patient Stated Goal: to go home OT Goal Formulation: With patient Time For Goal Achievement: 05/13/13 Potential to Achieve Goals: Good ADL Goals Pt Will Perform Grooming: with supervision;standing Pt Will Perform Lower Body Bathing: with supervision;sitting/lateral leans;sit to/from stand Pt Will Perform Lower Body Dressing: with supervision;sitting/lateral leans;sit to/from stand Pt Will Transfer to Toilet: with supervision;ambulating (3 in 1 over commode) Pt Will Perform Toileting - Clothing Manipulation and hygiene: with supervision;sit to/from stand;sitting/lateral leans  Visit Information  Last OT Received On: 05/06/13 Assistance Needed: +2 PT/OT Co-Evaluation/Treatment: Yes History of Present Illness: Pt is an 79 y.o. male adm due to fall in [place] parking  lot. pt is s/p Lt hip cannulated hip pinning. Pt with PMH of a CVA, has bil LE foot  drop due to idiopathic neuropathy. Pt reports he has faling ~4 times in last 15mo        Prior [place] expects to be discharged to:: Private residence Living Arrangements: Spouse/significant other Available Help at Discharge: Family;Available 24 hours/day Type of Home: House Home Access: Ramped entrance Home Layout: Two level Alternate Level Stairs-Number of Steps: pt has electric chair lift   Home Equipment: Walker - 4 wheels;Walker - 2 wheels;Bedside commode;Shower seat;Grab bars - tub/shower;Other (comment);Toilet riser;Hand held shower head (stair lift chair; power scooter) Additional Comments: Pt recently had progressed to amb with RW primarily; would use power scooter as needed; Pt has been NWB in past and was able to D/C home with bedroom in livingroom and was primarily power scooter <> bed <> BSC Prior Function Level of Independence: Independent with assistive device(s) Communication Communication: No difficulties Dominant Hand: Right         Vision/Perception     Cognition  Cognition Arousal/Alertness: Awake/alert Behavior During Therapy: WFL for tasks assessed/performed Overall Cognitive Status: Within Functional Limits for tasks assessed    Extremity/Trunk Assessment Upper Extremity Assessment Upper Extremity Assessment: Overall WFL for tasks assessed (tremors noted) Lower Extremity Assessment      Mobility Bed Mobility Bed Mobility: Supine to Sit;Sitting - Scoot to Edge of Bed Supine to Sit: 1: +2 Total assist;HOB elevated;With rails Supine to Sit: Patient Percentage: 60% Sitting - Scoot to Edge of Bed: 3: Mod assist;With rail Details for Bed Mobility Assistance: pt required increased time for bed mobility due to pain; required facilitation to advance Lt LE to/off EOB and use of draw pad to bring trunk to upright sitting position; mod-max  cues for hand placment and sequencing  Transfers Transfers: Sit to Stand;Stand to Sit Sit to Stand: 1: +2 Total assist;From bed;From elevated surface;With upper extremity assist Sit to Stand: Patient Percentage: 40% Stand to Sit: 1: +2 Total assist;To chair/3-in-1;With armrests;With upper extremity assist Stand to Sit: Patient Percentage: 40% Details for Transfer Assistance: pt unable to achieve full upright standing position; demo fwd flex trunk at all times due to pain; required 2+ (A) to maintain balance and faciliate transfer; max cues for hand placement and sequencing; required (A) to block Lt LE with transfer. +2 total A (40%) for stand pivot transfer.     Exercise        End of Session OT - End of Session Equipment Utilized During Treatment: Gait belt Activity Tolerance: Patient limited by pain Patient left: in chair;with call bell/phone within reach;with family/visitor present  GO     Benito Mccreedy OTR/L [id number] 05/06/2013, 1:47 PM

## 2013-05-06 NOTE — Progress Notes (Signed)
Patient has not voided on day shift today, and the last void was recorded at 00:25 this morning with a 1000cc output via In&Out cath. The patient stated he did not feel the need to void. Bladder scan showed >500, and a 2nd In&Out cath was performed at 15:30. 700cc was collected. IV fluids are running and PO's are being encouraged. Will continue to monitor. Reggy Eye RN 8/6 16:15

## 2013-05-06 NOTE — Progress Notes (Signed)
   CARE MANAGEMENT NOTE 05/06/2013  Patient:  Drew Gibson, Drew Gibson   Account Number:  [id number]  Date Initiated:  05/06/2013  Documentation initiated by:  [facility]  Subjective/Objective Assessment:   adm w/Left hip fx from fall at home.     Action/Plan:   Anticipated DC Date:  05/08/2013   Anticipated DC Plan:  [place]  CM consult      [facility] Choice  HOME HEALTH   Choice offered to / List presented to:  C-1 Patient        [place] arranged  [place].   Status of service:  In process, will continue to follow Medicare Important Message given?   (If response is "NO", the following Medicare IM given date fields will be blank) Date Medicare IM given:   Date Additional Medicare IM given:    Discharge Disposition:  [place]  Per UR Regulation:    If discussed at Long Length of Stay Meetings, dates discussed:    Comments:  05-06-13 15:15 Met with pt and pt's son in room with wife Inez Catalina on phone [phone], offered choice; pt chose [place] [facility] and states he would refuse SNF even if recommended; he also states he has a walker, commode, ramp, and hoverround at home.  Contacted [facility] rep and requested HH order from MD. Mariane Masters BSN, CM

## 2013-05-06 NOTE — Progress Notes (Signed)
SW will await PT/OT evaluations to help determine discharge disposition. SW will continue to follow.  Rhea Pink, MSW, [phone]

## 2013-05-06 NOTE — Progress Notes (Signed)
Orthopedic Tech Progress Note Patient Details:  Drew Gibson 04-22-1929 [id number]  Patient ID: [name] Section, male   DOB: [birth year], 79 y.o.   MRN: [id number]   Irish Elders 05/06/2013, 2:27 PMTrapeze bar

## 2013-05-07 DIAGNOSIS — IMO0002 Reserved for concepts with insufficient information to code with codable children: Secondary | ICD-10-CM

## 2013-05-07 LAB — BASIC METABOLIC PANEL
CO2: 24 mEq/L (ref 19–32)
Calcium: 8.9 mg/dL (ref 8.4–10.5)
Creatinine, Ser: 2 mg/dL — ABNORMAL HIGH (ref 0.50–1.35)
Glucose, Bld: 119 mg/dL — ABNORMAL HIGH (ref 70–99)

## 2013-05-07 LAB — TYPE AND SCREEN
ABO/RH(D): A POS
Unit division: 0

## 2013-05-07 NOTE — Progress Notes (Signed)
    Subjective:  Patient reports pain as mild.    Objective:   VITALS:   Filed Vitals:   05/06/13 1600 05/06/13 2211 05/07/13 0700 05/07/13 1345  BP:  124/61 134/67 99/54  Pulse:  79 104 106  Temp:  98.5 F (36.9 C) 97.7 F (36.5 C) 99.2 F (37.3 C)  TempSrc:  Oral Oral Oral  Resp: 16 16 16 16   Height:      Weight:      SpO2:  96% 95% 99%    LLE: Dressing: C/D/I RLE: SILT DP/SP/S/S/T, 2+DP, no motor is baseline for him in BLE  LABS  Results for orders placed during the hospital encounter of 05/04/13 (from the past 24 hour(s))  BASIC METABOLIC PANEL     Status: Abnormal   Collection Time    05/07/13  5:00 AM      Result Value Range   Sodium 137  135 - 145 mEq/L   Potassium 4.8  3.5 - 5.1 mEq/L   Chloride 102  96 - 112 mEq/L   CO2 24  19 - 32 mEq/L   Glucose, Bld 119 (*) 70 - 99 mg/dL   BUN 31 (*) 6 - 23 mg/dL   Creatinine, Ser 2.00 (*) 0.50 - 1.35 mg/dL   Calcium 8.9  8.4 - 10.5 mg/dL   GFR calc non Af Amer 30 (*) >90 mL/min   GFR calc Af Amer 34 (*) >90 mL/min     Assessment/Plan: 2 Days Post-Op   Principal Problem:   Closed left femoral fracture Active Problems:   Gout   CKD (chronic kidney disease), stage III   Abnormality of gait   Anisocoria   CVA (cerebral infarction)   Hyperlipidemia   Polyneuropathy in other diseases classified elsewhere   Spinal stenosis, lumbar region, with neurogenic claudication   PLAN: WBAT LLE PT/OT Dressing change POD 3 Dispo per Social work VTE prophylaxis: Plavix/SCD/TED/Ambulation F/u me in 2wks for staple removal and re check  Please call with any further questions   Edmonia Lynch, D 05/07/2013, 8:13 PM   Edmonia Lynch, MD Cell [phone]

## 2013-05-07 NOTE — Progress Notes (Signed)
Clinical Social Work Department  BRIEF PSYCHOSOCIAL ASSESSMENT  Patient: LABIB BUJNOWSKI  Account Number: [id number]  Admit date: 05/07/13 Clinical Social Worker Rhea Pink, MSW Date/Time: 05/07/2013 3:30 PM Referred by: Physician Date Referred: 05/07/2013 Referred for   SNF Placement   Other Referral:  Interview type: Patient and patient's wife Other interview type: PSYCHOSOCIAL DATA  Living Status: Wife Admitted from facility:  Level of care:  Primary support name: Eliud Rodway Primary support relationship to patient: Spouse Degree of support available:  Strong and vested  CURRENT CONCERNS  Current Concerns   Post-Acute Placement   Other Concerns:  SOCIAL WORK ASSESSMENT / PLAN  CSW met with pt re: PT recommendation for SNF.   Pt lives with his wife  CSW explained placement process and answered questions.   Pt reports [facility]  as their preference    CSW completed FL2 and initiated SNF search.     Assessment/plan status: Information/Referral to [employer]  Other assessment/ plan:  Information/referral to community resources:  SNF     PATIENT'S/FAMILY'S RESPONSE TO PLAN OF CARE:  Pt  reports he is agreeable to ST SNF in order to increase strength and independence with mobility prior to returning home  Pt verbalized understanding of placement process and appreciation for CSW assist.   Rhea Pink, MSW [phone]

## 2013-05-07 NOTE — Progress Notes (Signed)
At 0515 bladder scan showed 200 ml, received an order to I/O cath.  Did I/O cath at [facility], removed 1000 ml from bladder.

## 2013-05-07 NOTE — Progress Notes (Signed)
Occupational Therapy Treatment Patient Details Name: Drew Gibson MRN: [id number] DOB: [birth year] Today's Date: 05/07/2013 Time: 1100-1130 OT Time Calculation (min): 30 min  OT Assessment / Plan / Recommendation  History of present illness Pt is an 79 y.o. male adm due to fall in [place] parking lot. pt is s/p Lt hip cannulated hip pinning. Pt with PMH of a CVA, has bil LE foot drop due to idiopathic neuropathy. Pt reports he has faling ~4 times in last 39mo    OT comments  Pt overall Total A for LB ADLs with sit to stand transfer and stand pivot transfer. Discussed SNF vs home secondary to difficulty transferring today (w/ pt/family). Recommend SNF & pt/family verbilized understanding and agreement w/ change in POC, now considering SNF.  Follow Up Recommendations  SNF    Barriers to Discharge       Equipment Recommendations  None recommended by OT    Recommendations for Other Services    Frequency Min 2X/week   Progress towards OT Goals Progress towards OT goals: Progressing toward goals  Plan Discharge plan needs to be updated    Precautions / Restrictions Precautions Precautions: Fall Precaution Comments: recent falls  Required Braces or Orthoses: Other Brace/Splint Other Brace/Splint: bil AFOs for amb  (Pt states wife left at home) Restrictions Weight Bearing Restrictions: Yes LLE Weight Bearing: Weight bearing as tolerated   Pertinent Vitals/Pain "I'm not in pain, I haven't moved yet." Pt did not rate pain w/ transfer.    ADL  Eating/Feeding: Independent;Performed Where Assessed - Eating/Feeding: Chair Grooming: Set up;Wash/dry hands Where Assessed - Grooming: Supported sitting Lower Body Bathing: Performed;+2 Total assistance Lower Body Bathing: Patient Percentage: 10% Where Assessed - Lower Body Bathing: Supported sit to stand Lower Body Dressing: +2 Total assistance Lower Body Dressing: Patient Percentage: 10% Where Assessed - Lower Body Dressing: Supported sit  to stand Toilet Transfer: Simulated;+2 Total assistance (Attempted 3:1 transfer, pt unable) Toilet Transfer: Patient Percentage: 30% Armed forces technical officer Method: Arts development officer: Bedside commode (Attempted to 3:1, pt unable & SPT to chair instead) Toileting - Clothing Manipulation and Hygiene: Performed;+2 Total assistance (Pt incontinent of bladder during transfer) Toileting - Clothing Manipulation and Hygiene: Patient Percentage: 0% Where Assessed - Camera operator Manipulation and Hygiene: Standing Tub/Shower Transfer Method: Not assessed Equipment Used: Gait belt;Rolling walker Transfers/Ambulation Related to ADLs: +2 total A (pt 20%) for transfers, very weak (UB/LB) overall. ADL Comments: Pt overall Total A for LB ADLs with sit to stand transfer and stand pivot transfer. Discussed SNF vs home secondary to difficulty transferring today (pt/family). Recommend SNF & pt/family verbilized understanding and agreement w/ change in POC.    OT Diagnosis:    OT Problem List:   OT Treatment Interventions:     OT Goals(current goals can now be found in the care plan section) Acute Rehab OT Goals Patient Stated Goal: to go home if able, considering SNF rehab OT Goal Formulation: With patient Time For Goal Achievement: 05/13/13 Potential to Achieve Goals: Good  Visit Information  Last OT Received On: 05/07/13 Assistance Needed: +2 PT/OT Co-Evaluation/Treatment: Yes History of Present Illness: Pt is an 79 y.o. male adm due to fall in [place] parking lot. pt is s/p Lt hip cannulated hip pinning. Pt with PMH of a CVA, has bil LE foot drop due to idiopathic neuropathy. Pt reports he has faling ~4 times in last 48mo     Subjective Data      Prior Functioning  Cognition  Cognition Arousal/Alertness: Awake/alert Behavior During Therapy: WFL for tasks assessed/performed Overall Cognitive Status: Within Functional Limits for tasks assessed    Mobility  Bed  Mobility Bed Mobility: Supine to Sit;Sitting - Scoot to Edge of Bed Supine to Sit: HOB flat;With rails;1: +2 Total assist Supine to Sit: Patient Percentage: 50% Sitting - Scoot to Edge of Bed: 3: Mod assist Details for Bed Mobility Assistance: pt required increased time to complete bed mobility due to pain with movement; required (A) to advance Lt LE to/off EOB and to bring shoulders to upright sitting position; pt was able to shift hips and (A) with transfer, limited by pain; multimodal cueing for hand placement and sequencing Transfers Transfers: Sit to Stand;Stand to Sit Sit to Stand: 1: +2 Total assist;From bed;From elevated surface;With upper extremity assist Sit to Stand: Patient Percentage: 30% Stand to Sit: 1: +2 Total assist;To chair/3-in-1;With armrests;With upper extremity assist Stand to Sit: Patient Percentage: 30% Details for Transfer Assistance: pt relies heavily on UEs due to decr ability to WB through [place]; pt with Rt knee hyperext during stance; unable to achieve full upright position; pt requries 2+ (A) to maintain balance, tends to push posteriorly through heels; requires (A) to manage and manipulate RW for SPT; has difficulty shifting weight, requries faclitation through hips          Balance Balance Balance Assessed: Yes Static Sitting Balance Static Sitting - Balance Support: Bilateral upper extremity supported;Feet supported Static Sitting - Level of Assistance: 5: Stand by assistance Static Standing Balance Static Standing - Balance Support: During functional activity;Bilateral upper extremity supported Static Standing - Level of Assistance: 2: Max assist Static Standing - Comment/# of Minutes: Pt was incontinent of bladder while standing for aprox 3 min today. Significant limitations secondary to fatigue & pt began to lean heavily towards Lt requiring max assist to maintain balance while chair was brought up behind pt.   End of Session OT - End of  Session Equipment Utilized During Treatment: Gait belt;Rolling walker Activity Tolerance: Patient limited by fatigue Patient left: in chair;with call bell/phone within reach;with family/visitor present  [place], [place] 05/07/2013, 12:13 PM

## 2013-05-07 NOTE — Clinical Social Work Placement (Addendum)
Clinical Social Work Department  CLINICAL SOCIAL WORK PLACEMENT NOTE  05/07/2013  Patient: Drew Gibson Account Number: [id number]  Admit date: 09/03/2012  Clinical Social Worker: Rhea Pink, MSW Date/time: 8/7/201411:30 AM  Clinical Social Work is seeking post-discharge placement for this patient at the following level of care: SKILLED NURSING (*CSW will update this form in Epic as items are completed)  05/07/2013 Patient/family provided with [place] Department of Clinical Social Work's list of facilities offering this level of care within the geographic area requested by the patient (or if unable, by the patient's family).  05/07/2013 Patient/family informed of their freedom to choose among providers that offer the needed level of care, that participate in Medicare, Medicaid or managed care program needed by the patient, have an available bed and are willing to accept the patient.  05/07/2013 Patient/family informed of [facility]' ownership interest in [facility], as well as of the fact that they are under no obligation to receive care at this facility.  PASARR submitted to EDS on 05/07/2013 PASARR number received from EDS on 05/07/2013 FL2 transmitted to all facilities in geographic area requested by pt/family on  FL2 transmitted to all facilities within larger geographic area on 05/07/2013  Patient informed that his/her managed care company has contracts with or will negotiate with certain facilities, including the following:  Patient/family informed of bed offers received: 05/08/2013 Patient chooses bed at  Physician recommends and patient chooses bed at [facility] Patient to be transferred to on  Patient to be transferred to facility by  The following physician request were entered in Epic:  Additional Comments:

## 2013-05-07 NOTE — Progress Notes (Signed)
Patient has not voided independently. At 0211 bladder scan showed 159 ml. Placed warm compress on lower abd.  Will continue to monitor.

## 2013-05-07 NOTE — Progress Notes (Signed)
Physical Therapy Treatment Patient Details Name: Drew Gibson MRN: [id number] DOB: 1929/01/22 Today's Date: 05/07/2013 Time: 1100-1130 PT Time Calculation (min): 30 min  PT Assessment / Plan / Recommendation  History of Present Illness Pt is an 79 y.o. male adm due to fall in [employer] parking lot. pt is s/p Lt hip cannulated hip pinning. Pt with PMH of a CVA, has bil LE foot drop due to idiopathic neuropathy. Pt reports he has faling ~4 times in last 74mo    PT Comments   Pt continues to require 2+ (A) for transfers. Discussed with pt and wife at length the benefits of STSNF and the amount of (A) the pt requires now. Pt and wife were disappointed and hoped to be going home from hospital but were agreeable and understanding. Pt will cont to benefit from skilled therapy while in acute setting to maximize functional mobility. Will attempt lateral transfers next session.   Follow Up Recommendations  SNF;Supervision/Assistance - 24 hour     Does the patient have the potential to tolerate intense rehabilitation     Barriers to Discharge        Equipment Recommendations  None recommended by PT    Recommendations for Other Services    Frequency Min 3X/week   Progress towards PT Goals Progress towards PT goals: Progressing toward goals  Plan Discharge plan needs to be updated;Frequency needs to be updated    Precautions / Restrictions Precautions Precautions: Fall Precaution Comments: recent falls  Required Braces or Orthoses: Other Brace/Splint Other Brace/Splint: bil AFOs for amb  (Pt states wife left at home) Restrictions Weight Bearing Restrictions: Yes LLE Weight Bearing: Weight bearing as tolerated   Pertinent Vitals/Pain States he only has pain when moving. Did not rate pain.     Mobility  Bed Mobility Bed Mobility: Supine to Sit Supine to Sit: HOB flat;With rails;1: +2 Total assist Supine to Sit: Patient Percentage: 50% Details for Bed Mobility Assistance: pt required  increased time to complete bed mobility due to pain with movement; required (A) to advance Lt LE to/off EOB and to bring shoulders to upright sitting position; pt was able to shift hips and (A) with transfer, limited by pain; multimodal cueing for hand placement and sequencing Transfers Transfers: Stand to Sit;Sit to Stand;Stand Pivot Transfers Sit to Stand: 1: +2 Total assist;From bed;From elevated surface;With upper extremity assist Sit to Stand: Patient Percentage: 30% Stand to Sit: 1: +2 Total assist;To chair/3-in-1;With armrests;With upper extremity assist Stand to Sit: Patient Percentage: 30% Stand Pivot Transfers: 1: +2 Total assist;From elevated surface;With armrests Stand Pivot Transfers: Patient Percentage: 20% Details for Transfer Assistance: pt relies heavily on UEs due to decr ability to WB through [place]; pt with Rt knee hyperext during stance; unable to achieve full upright position; pt requries 2+ (A) to maintain balance, tends to push posteriorly through heels; requires (A) to manage and manipulate RW for SPT; has difficulty shifting weight, requries faclitation through hips  Ambulation/Gait Ambulation/Gait Assistance: Not tested (comment) Stairs: No Wheelchair Mobility Wheelchair Mobility: No    Exercises General Exercises - Lower Extremity Ankle Circles/Pumps: PROM;10 reps;Supine;Both   PT Diagnosis:    PT Problem List:   PT Treatment Interventions:     PT Goals (current goals can now be found in the care plan section) Acute Rehab PT Goals Patient Stated Goal: to go home PT Goal Formulation: With patient Time For Goal Achievement: 05/13/13 Potential to Achieve Goals: Good  Visit Information  Last PT Received On: 05/07/13  Assistance Needed: +2 PT/OT Co-Evaluation/Treatment: Yes History of Present Illness: Pt is an 79 y.o. male adm due to fall in [place] parking lot. pt is s/p Lt hip cannulated hip pinning. Pt with PMH of a CVA, has bil LE foot drop due to idiopathic  neuropathy. Pt reports he has faling ~4 times in last 68mo     Subjective Data  Subjective: pt lying supine; agreeable to therapy  Patient Stated Goal: to go home   Cognition  Cognition Arousal/Alertness: Awake/alert Behavior During Therapy: WFL for tasks assessed/performed Overall Cognitive Status: Within Functional Limits for tasks assessed    Balance  Balance Balance Assessed: Yes Static Standing Balance Static Standing - Balance Support: During functional activity;Bilateral upper extremity supported Static Standing - Level of Assistance: 2: Max assist Static Standing - Comment/# of Minutes: pt began urinating while standing; tolerated standing ~3 min and was heavily fatigued; began to lean heavily on towards Lt to and requried max (A) to maintain balance   End of Session PT - End of Session Equipment Utilized During Treatment: Gait belt Activity Tolerance: Patient limited by fatigue;Patient limited by pain Patient left: in chair;with call bell/phone within reach;with family/visitor present Nurse Communication: Mobility status   GP     Gustavus Bryant, [place] 05/07/2013, 12:06 PM

## 2013-05-07 NOTE — Progress Notes (Signed)
[facility] PROGRESS NOTE  Drew Gibson [id number] DOB: 02/02/1929 DOA: 05/04/2013 PCP: [name], MD  Assessment/Plan:  1. Mechanical trip and fall with left intertrochanteric hip fracture. -post ORIF by Ortho 05-05-13, tolerated the procedure well, -Seen with PT this a.m. Still requiring two-person assist with such as getting out of bed to chair this a.m>> will likely need SNF prior to discharge home, he will think about it. -continue pain management, follow  2. Hypertension - Vasotec on hold for now due to cr trending up, continue when necessary IV Lopressor parameters  3. BPH continue Flomax.  4. GERD patient on PPI.  5. History of CVA. On Plavix, stable.  6. Chronic peripheral neuropathy with bilateral foot drop. PT/OT 7. History of gout on allopurinol which will be continued.  8. Urinary retention-postop, s/p in/out cath will follow 9. acute onCKD 3,  - (creat 1.6) -Cr trending up -#8 contributing, follow and recheck off Vasotec today    Code Status: Full  Family Communication: family bedside  Disposition Plan: TBD    Procedures ORIF L Femur 05-05-13  Consults Ortho     HPI/Subjective:  2 person PT assisting patient to chair with difficulty. He denies any new complaints Objective: Filed Vitals:   05/07/13 0700  BP: 134/67  Pulse: 104  Temp: 97.7 F (36.5 C)  Resp: 16    Intake/Output Summary (Last 24 hours) at 05/07/13 1122 Last data filed at 05/07/13 0855  Gross per 24 hour  Intake   1215 ml  Output   1700 ml  Net   -485 ml   Filed Weights   05/04/13 2100 05/05/13 0500 05/06/13 0531  Weight: 70.308 kg (155 lb) 72.757 kg (160 lb 6.4 oz) 70.308 kg (155 lb)    Exam:  General: alert & oriented x 3 In NAD Cardiovascular: RRR, nl S1 s2 Respiratory: CTAB Abdomen: soft +BS NT/ND, no masses palpable Extremities: No cyanosis and no edema   Data Reviewed: Basic Metabolic Panel:  Recent Labs Lab 05/04/13 1745 05/05/13 0515 05/06/13 0500  05/07/13 0500  NA 137 139 135 137  K 4.6 5.0 4.7 4.8  CL 102 106 102 102  CO2 25 25 26 24   GLUCOSE 108* 107* 128* 119*  BUN 36* 32* 27* 31*  CREATININE 1.59* 1.57* 1.82* 2.00*  CALCIUM 9.7 9.4 8.9 8.9   Liver Function Tests:  Recent Labs Lab 05/04/13 1745  AST 24  ALT 23  ALKPHOS 70  BILITOT 0.3  PROT 6.9  ALBUMIN 3.5   No results found for this basename: LIPASE, AMYLASE,  in the last 168 hours No results found for this basename: AMMONIA,  in the last 168 hours CBC:  Recent Labs Lab 05/04/13 1745 05/05/13 0515 05/06/13 0500  WBC 8.3 6.4 7.9  NEUTROABS 5.9  --   --   HGB 13.3 12.2* 12.1*  HCT 38.9* 37.4* 36.3*  MCV 99.5 100.3* 100.0  PLT 133* 133* 112*   Cardiac Enzymes:  Recent Labs Lab 05/04/13 1745  TROPONINI <0.30   BNP (last 3 results) No results found for this basename: PROBNP,  in the last 8760 hours CBG: No results found for this basename: GLUCAP,  in the last 168 hours  Recent Results (from the past 240 hour(s))  SURGICAL PCR SCREEN     Status: None   Collection Time    05/04/13 10:30 PM      Result Value Range Status   MRSA, PCR NEGATIVE  NEGATIVE Final   Staphylococcus aureus NEGATIVE  NEGATIVE Final   Comment:            The Xpert SA Assay (FDA     approved for NASAL specimens     in patients over 26 years of age),     is one component of     a comprehensive surveillance     program.  Test performance has     been validated by [employer] for patients greater     than or equal to 63 year old.     It is not intended     to diagnose infection nor to     guide or monitor treatment.     Studies: No results found.  Scheduled Meds: . allopurinol  100 mg Oral BID  . cholecalciferol  2,000 Units Oral Daily  . clopidogrel  75 mg Oral QHS  . docusate sodium  100 mg Oral BID  . multivitamin with minerals  1 tablet Oral Daily  . pantoprazole  40 mg Oral Daily  . sodium chloride  3 mL Intravenous Q12H  . tamsulosin  0.4 mg Oral  Daily   Continuous Infusions: . sodium chloride 75 mL/hr at 05/07/13 0404    Principal Problem:   Closed left femoral fracture Active Problems:   Gout   CKD (chronic kidney disease), stage III   Abnormality of gait   Anisocoria   CVA (cerebral infarction)   Hyperlipidemia   Polyneuropathy in other diseases classified elsewhere   Spinal stenosis, lumbar region, with neurogenic claudication    Time spent: [facility] Pager [phone]. If 7PM-7AM, please contact night-coverage at [email or web address], password [facility] 05/07/2013, 11:22 AM  LOS: 3 days

## 2013-05-08 ENCOUNTER — Encounter (HOSPITAL_COMMUNITY): Payer: Self-pay | Admitting: Orthopedic Surgery

## 2013-05-08 DIAGNOSIS — G63 Polyneuropathy in diseases classified elsewhere: Secondary | ICD-10-CM

## 2013-05-08 LAB — BASIC METABOLIC PANEL
BUN: 30 mg/dL — ABNORMAL HIGH (ref 6–23)
Calcium: 8.6 mg/dL (ref 8.4–10.5)
GFR calc non Af Amer: 36 mL/min — ABNORMAL LOW (ref >90)
Glucose, Bld: 116 mg/dL — ABNORMAL HIGH (ref 70–99)

## 2013-05-08 MED ORDER — POLYETHYLENE GLYCOL 3350 17 G PO PACK
17.0000 g | PACK | Freq: Every day | ORAL | Status: DC | PRN
  Administered 2013-05-08: 17 g via ORAL
  Filled 2013-05-08: qty 1

## 2013-05-08 MED ORDER — METOCLOPRAMIDE HCL 10 MG PO TABS
10.0000 mg | ORAL_TABLET | Freq: Three times a day (TID) | ORAL | Status: DC | PRN
  Administered 2013-05-08: 10 mg via ORAL
  Filled 2013-05-08: qty 1

## 2013-05-08 MED ORDER — SENNOSIDES-DOCUSATE SODIUM 8.6-50 MG PO TABS
2.0000 | ORAL_TABLET | Freq: Every day | ORAL | Status: DC
  Administered 2013-05-08: 2 via ORAL
  Filled 2013-05-08: qty 2

## 2013-05-08 MED ORDER — FLEET ENEMA 7-19 GM/118ML RE ENEM: 1.0000 | ENEMA | Freq: Every day | RECTAL | Status: DC | PRN

## 2013-05-08 NOTE — Progress Notes (Signed)
Patient accepted a bed at [facility]. Patient plans to be transported by ambulance on 05/08/13. [employer] is [id number]   Rhea Pink, MSW,  [phone]

## 2013-05-08 NOTE — Progress Notes (Addendum)
[facility] PROGRESS NOTE  Drew Gibson [id number] DOB: 09-14-1929 DOA: 05/04/2013 PCP: [name], MD   Assessment/Plan: 1. Mechanical trip and fall with left intertrochanteric hip fracture.  -post ORIF by Ortho 05-05-13, tolerated the procedure well,  -Seen with PT this a.m. Still requiring two-person assist with such as getting out of bed to chair this a.m>> will likely need SNF prior to discharge home, he will think about it.  -continue pain management, follow  2. Hypertension - cr trending down off vasotec, continue when necessary IV Lopressor parameters  3. BPH continue Flomax.  4. GERD patient on PPI.  5. History of CVA. On Plavix, stable.  6. Chronic peripheral neuropathy with bilateral foot drop. PT/OT  7. History of gout on allopurinol which will be continued.  8. Urinary retention-postop, s/p in/out cath again this am, plan is to place indwelling foley if still not completely emptying bladder. 9. acute onCKD 3, - (creat 1.6)  -Cr trending down off Vasotec today  -#8 contributing, follow and recheck  10.constipation -due to narcotics -add prn enema to current bowel regimen  Code Status: Full  Family Communication: wife at bedside  Disposition Plan: To SNF Likely in am  Procedures ORIF L Femur 05-05-13  Consults Ortho     HPI/Subjective: Pt c/o constipation. Also per nsg after voiding this am, i/o cath still had 900cc drained  Objective: Filed Vitals:   05/08/13 0629  BP: 139/82  Pulse: 109  Temp: 98.4 F (36.9 C)  Resp: 18    Intake/Output Summary (Last 24 hours) at 05/08/13 1401 Last data filed at 05/08/13 0900  Gross per 24 hour  Intake 2268.75 ml  Output    250 ml  Net 2018.75 ml   Filed Weights   05/05/13 0500 05/06/13 0531 05/08/13 0629  Weight: 72.757 kg (160 lb 6.4 oz) 70.308 kg (155 lb) 69.854 kg (154 lb)    Exam:  General: alert & oriented x 3 In NAD  Cardiovascular: RRR, nl S1 s2  Respiratory: CTAB  Abdomen: soft +BS NT/ND,  no masses palpable  Extremities: No cyanosis and no edema   Data Reviewed: Basic Metabolic Panel:  Recent Labs Lab 05/04/13 1745 05/05/13 0515 05/06/13 0500 05/07/13 0500 05/08/13 0515  NA 137 139 135 137 135  K 4.6 5.0 4.7 4.8 4.8  CL 102 106 102 102 104  CO2 25 25 26 24 22   GLUCOSE 108* 107* 128* 119* 116*  BUN 36* 32* 27* 31* 30*  CREATININE 1.59* 1.57* 1.82* 2.00* 1.70*  CALCIUM 9.7 9.4 8.9 8.9 8.6   Liver Function Tests:  Recent Labs Lab 05/04/13 1745  AST 24  ALT 23  ALKPHOS 70  BILITOT 0.3  PROT 6.9  ALBUMIN 3.5   No results found for this basename: LIPASE, AMYLASE,  in the last 168 hours No results found for this basename: AMMONIA,  in the last 168 hours CBC:  Recent Labs Lab 05/04/13 1745 05/05/13 0515 05/06/13 0500  WBC 8.3 6.4 7.9  NEUTROABS 5.9  --   --   HGB 13.3 12.2* 12.1*  HCT 38.9* 37.4* 36.3*  MCV 99.5 100.3* 100.0  PLT 133* 133* 112*   Cardiac Enzymes:  Recent Labs Lab 05/04/13 1745  TROPONINI <0.30   BNP (last 3 results) No results found for this basename: PROBNP,  in the last 8760 hours CBG: No results found for this basename: GLUCAP,  in the last 168 hours  Recent Results (from the past 240 hour(s))  SURGICAL PCR  SCREEN     Status: None   Collection Time    05/04/13 10:30 PM      Result Value Range Status   MRSA, PCR NEGATIVE  NEGATIVE Final   Staphylococcus aureus NEGATIVE  NEGATIVE Final   Comment:            The Xpert SA Assay (FDA     approved for NASAL specimens     in patients over 70 years of age),     is one component of     a comprehensive surveillance     program.  Test performance has     been validated by [employer] for patients greater     than or equal to 57 year old.     It is not intended     to diagnose infection nor to     guide or monitor treatment.     Studies: No results found.  Scheduled Meds: . allopurinol  100 mg Oral BID  . cholecalciferol  2,000 Units Oral Daily  .  clopidogrel  75 mg Oral QHS  . docusate sodium  100 mg Oral BID  . multivitamin with minerals  1 tablet Oral Daily  . pantoprazole  40 mg Oral Daily  . senna-docusate  2 tablet Oral Q0600  . sodium chloride  3 mL Intravenous Q12H  . tamsulosin  0.4 mg Oral Daily   Continuous Infusions: . sodium chloride 75 mL/hr at 05/08/13 [id number]    Principal Problem:   Closed left femoral fracture Active Problems:   Gout   CKD (chronic kidney disease), stage III   Abnormality of gait   Anisocoria   CVA (cerebral infarction)   Hyperlipidemia   Polyneuropathy in other diseases classified elsewhere   Spinal stenosis, lumbar region, with neurogenic claudication    Time spent: [facility] Pager [phone]. If 7PM-7AM, please contact night-coverage at [email or web address], password [facility] 05/08/2013, 2:01 PM  LOS: 4 days

## 2013-05-08 NOTE — Progress Notes (Signed)
Pt voided two times between the hours of 1930 and 2300, a total of 200cc. At 0100, pt c/o difficulty emptying his bladder. Bladder scan was done with results of over 900cc. In and out cath was done with an output of 950cc. Pt felt better and slept till 0620, which he tried again to void and was not successful. MD notified, orders were put in for pt. Endorsed to Public house manager.

## 2013-05-08 NOTE — Progress Notes (Signed)
PRN miralax and dulcolax suppository were given at 1521. Patient had a successful BM.

## 2013-05-08 NOTE — Progress Notes (Addendum)
Physical Therapy Treatment Patient Details Name: Drew Gibson MRN: [id number] DOB: 07/04/1929 Today's Date: 05/08/2013 Time: [id number] PT Time Calculation (min): 30 min  PT Assessment / Plan / Recommendation  History of Present Illness Pt is an 79 y.o. male adm due to fall in [employer] parking lot. pt is s/p Lt hip cannulated hip pinning. Pt with PMH of a CVA, has bil LE foot drop due to idiopathic neuropathy. Pt reports he has faling ~4 times in last 92mo    PT Comments   Pt able to amb short distance today with 2+ (A). Pt exhibits fear of falling and balance deficits, requiring increased (A) for transfers and mobility. Pt is more agreeable to STSNF for continued therapy today. Pt is highly motivated to get more independent to return home with wife. Will cont to f/u with pt while in acute setting to maximize functional mobility.   Follow Up Recommendations  SNF;Supervision/Assistance - 24 hour     Does the patient have the potential to tolerate intense rehabilitation     Barriers to Discharge        Equipment Recommendations  None recommended by PT    Recommendations for Other Services    Frequency Min 3X/week   Progress towards PT Goals Progress towards PT goals: Progressing toward goals  Plan Current plan remains appropriate    Precautions / Restrictions Precautions Precautions: Fall Precaution Comments: recent falls  Required Braces or Orthoses: Other Brace/Splint Other Brace/Splint: wears ankle supports for foot drop; has AFO but does not use those  Restrictions Weight Bearing Restrictions: Yes LLE Weight Bearing: Weight bearing as tolerated   Pertinent Vitals/Pain No pain. Feeling better.     Mobility  Bed Mobility Bed Mobility: Not assessed Transfers Transfers: Sit to Stand;Stand to Sit Sit to Stand: 1: +2 Total assist;From chair/3-in-1;With upper extremity assist;With armrests Sit to Stand: Patient Percentage: 40% Stand to Sit: To chair/3-in-1;With armrests;With  upper extremity assist;1: +2 Total assist Stand to Sit: Patient Percentage: 40% Details for Transfer Assistance: performed sit to stand x 3; pt required 2+ (A) to achieve standing and upright position; pt with vc's to come to upright position; tends to lean posteriorly and fwd flex trunk; pt exhibts fear of falling; max cues for hand placement and safety; pt fatigues quickly; requires Lt knee blocking to stand  Ambulation/Gait Ambulation/Gait Assistance: 1: +2 Total assist Ambulation/Gait: Patient Percentage: 20% Ambulation Distance (Feet): 4 Feet Assistive device: Rolling walker Ambulation/Gait Assistance Details: pt requires 2+ (A) for amb; requirces facilitation and (A) to advance bil LEs; has difficulty weightshifting to either side; relies heaviliy on UEs and exhibits fear of falling; multimodal cues for upright posture and gt sequencing  Gait Pattern: Step-to pattern;Decreased weight shift to right;Right circumduction;Decreased dorsiflexion - left;Decreased dorsiflexion - right;Decreased hip/knee flexion - right;Decreased hip/knee flexion - left;Decreased stride length;Trunk flexed;Narrow base of support;Decreased trunk rotation Gait velocity: significantly decreased  Stairs: No Wheelchair Mobility Wheelchair Mobility: No    Exercises General Exercises - Lower Extremity Ankle Circles/Pumps: PROM;10 reps;Seated;Both   PT Diagnosis:    PT Problem List:   PT Treatment Interventions:     PT Goals (current goals can now be found in the care plan section) Acute Rehab PT Goals Patient Stated Goal: to get better as fast as i can to go home  PT Goal Formulation: With patient Time For Goal Achievement: 05/13/13 Potential to Achieve Goals: Good  Visit Information  Last PT Received On: 05/08/13 Assistance Needed: +2 History of Present Illness: Pt is  an 79 y.o. male adm due to fall in [employer] parking lot. pt is s/p Lt hip cannulated hip pinning. Pt with PMH of a CVA, has bil LE foot drop due  to idiopathic neuropathy. Pt reports he has faling ~4 times in last 71mo     Subjective Data  Subjective: pt sitting in chair; stated " i have my braces and want to walk today"  Patient Stated Goal: to get better as fast as i can to go home    Cognition  Cognition Arousal/Alertness: Awake/alert Behavior During Therapy: WFL for tasks assessed/performed Overall Cognitive Status: Within Functional Limits for tasks assessed    Balance  Balance Balance Assessed: Yes Static Standing Balance Static Standing - Balance Support: During functional activity;Bilateral upper extremity supported Static Standing - Level of Assistance: 1: +2 Total assist Static Standing - Comment/# of Minutes: pt tolerated standing ~2 min while trying to urniate; pt demo posterior lean; vc's for upright posture and safety   End of Session PT - End of Session Equipment Utilized During Treatment: Gait belt Activity Tolerance: Patient tolerated treatment well Patient left: in chair;with call bell/phone within reach;with family/visitor present Nurse Communication: Mobility status   GP     Gustavus Bryant, [place] 05/08/2013, 10:55 AM

## 2013-05-09 DIAGNOSIS — S72009A Fracture of unspecified part of neck of unspecified femur, initial encounter for closed fracture: Secondary | ICD-10-CM

## 2013-05-09 DIAGNOSIS — N400 Enlarged prostate without lower urinary tract symptoms: Secondary | ICD-10-CM

## 2013-05-09 LAB — BASIC METABOLIC PANEL
CO2: 23 mEq/L (ref 19–32)
Calcium: 9.1 mg/dL (ref 8.4–10.5)
GFR calc Af Amer: 54 mL/min — ABNORMAL LOW (ref >90)
GFR calc non Af Amer: 46 mL/min — ABNORMAL LOW (ref >90)
Sodium: 137 mEq/L (ref 135–145)

## 2013-05-09 MED ORDER — BISACODYL 10 MG RE SUPP: 10.0000 mg | RECTAL | Status: DC | PRN

## 2013-05-09 MED ORDER — HYDROCODONE-ACETAMINOPHEN 5-325 MG PO TABS: 1.0000 | ORAL_TABLET | Freq: Four times a day (QID) | ORAL | Status: DC | PRN

## 2013-05-09 MED ORDER — POLYETHYLENE GLYCOL 3350 17 G PO PACK: 17.0000 g | PACK | Freq: Every day | ORAL | Status: DC | PRN

## 2013-05-09 MED ORDER — SENNOSIDES-DOCUSATE SODIUM 8.6-50 MG PO TABS: 2.0000 | ORAL_TABLET | Freq: Every day | ORAL | Status: DC

## 2013-05-09 NOTE — Discharge Summary (Signed)
Physician Discharge Summary  Drew Gibson [id number] DOB: [birth year] DOA: 05/04/2013  PCP: [name], MD  Admit date: 05/04/2013 Discharge date: 05/09/2013  Time spent: >30 minutes  Recommendations for Outpatient Follow-up:  Follow-up Information   Follow up with Edmonia Lynch, D, MD. Schedule an appointment as soon as possible for a visit in 2 weeks.   Contact information:   [address] [place] [address] [phone]       Please follow up. (SNF MD in 1-2days)       Follow up with Malka So, MD. (call for appt upon discharge)    Contact information:   [address] [place] [address] [phone]      patient needs TED hose at SNF for continued VTE prophylaxis  Discharge Diagnoses:  Principal Problem:   Closed left femoral fracture Active Problems:   Gout   CKD (chronic kidney disease), stage III   Abnormality of gait   Anisocoria   CVA (cerebral infarction)   Hyperlipidemia   Polyneuropathy in other diseases classified elsewhere   Spinal stenosis, lumbar region, with neurogenic claudication   Discharge Condition: improved/stable  Diet recommendation: heart healthy  Filed Weights   05/05/13 0500 05/06/13 0531 05/08/13 0629  Weight: 72.757 kg (160 lb 6.4 oz) 70.308 kg (155 lb) 69.854 kg (154 lb)    History of present illness:  Drew Gibson is a 79 y.o. male, with history of poor gait, chronic peripheral neuropathy with bilateral foot drop, BPH, CVA in the past with no residual deficits, GERD, gout, hypertension, dyslipidemia, chronic kidney disease stage III baseline creatinine 1.6, who was at [place] and happen to trip due to loss of balance, he fell on his left hip, he experienced sharp nonradiating left hip pain which was constant, worse by bearing weight better with rest, no other associated symptoms, came to the ER where x-rays revealed left intertrochanteric femoral fracture, ER physician called me for  admission, he has a showed me that he will talk to the orthopedic physician before the patient leaves the floor. Apparently he has patient the orthopedic physician.   Hospital Course:  1. Mechanical trip and fall with left intertrochanteric hip fracture.  -As discussed above, upon admission was was consulted and patient was seen by Dr. Percell Miller and taken to surgery-ORIF by  05-05-13, tolerated the procedure well  -Patient and family had initially desired for him to go home directly from hospital but worked with PT and was still requiring two-person assist to skilled nursing was recommended and social work assisted with placing patient at the rehabilitation. -Used to followup with Dr. Percell Miller in 2 weeks -His to continue Plavix/TED as per orthopedics at a nursing facility for VTE prophylaxis  2. Hypertension - his Vasotec was held in the hospital due to elevated creatinine,and he was covered with IV antihypertensives as needed. His creatinine is now improved and his to continue his Vasotec when discharged 3. BPH continue Flomax.  4. GERD patient on PPI.  5. History of CVA. On Plavix, stable.  6. Chronic peripheral neuropathy with bilateral foot drop. PT/OT  7. History of gout on allopurinol which will be continued.  8. Urinary retention-postop, in patient with BPH. He was retaining urine and in and out cath was done on several occasions and subsequently a Foley catheter had to be placed. He was maintained on Flomax which is to continue upon discharge. He'll be discharged with a Foley catheter and is to followup with [facility]/Dr. Jeffie Pollock 9. acute onCKD 3, - (creat 1.6)  -As discussed #2 above the patient's creatinine trended up to 2, his Vasotec was held and he was hydrated. His creatinine is improved today to 1.38. His to resume his Vasotec upon discharge. Obstructive uropathy-as above was likely contributing as well and patient was discharged with a Foley. 10.constipation  -due to narcotics   -Resolved, continue bowel regimen upon Code Status: Full  Family Communication: wife at bedside  Disposition Plan: To SNF Likely in am  Procedures ORIF L Femur 05-05-13  Consults Ortho    Discharge Exam: Filed Vitals:   05/09/13 0800  BP:   Pulse:   Temp:   Resp: 18   Exam:  General: alert & oriented x 3 In NAD  Cardiovascular: RRR, nl S1 s2  Respiratory: CTAB  Abdomen: soft +BS NT/ND, no masses palpable  Extremities: No cyanosis and no edema   Discharge Instructions  Discharge Orders   Future Appointments Provider Department Dept Phone   09/21/2013 9:00 AM Kathrynn Ducking, MD [facility] [phone]   11/04/2013 12:00 PM [name], MD [facility] [phone]   Future Orders Complete By Expires     Diet - low sodium heart healthy  As directed     Increase activity slowly  As directed     Weight bearing as tolerated  As directed         Medication List         allopurinol 100 MG tablet  Commonly known as:  ZYLOPRIM  Take 100 mg by mouth 2 (two) times daily.     b complex vitamins capsule  Take 1 capsule by mouth daily.     bisacodyl 10 MG suppository  Commonly known as:  DULCOLAX  Place 1 suppository (10 mg total) rectally as needed.     cholecalciferol 1000 UNITS tablet  Commonly known as:  VITAMIN D  Take 2,000 Units by mouth daily.     clopidogrel 75 MG tablet  Commonly known as:  PLAVIX  Take 75 mg by mouth daily.     enalapril 5 MG tablet  Commonly known as:  VASOTEC  Take 5 mg by mouth daily.     HYDROcodone-acetaminophen 5-325 MG per tablet  Commonly known as:  NORCO  Take 1 tablet by mouth every 6 (six) hours as needed for pain.     multivitamins ther. w/minerals Tabs tablet  Take 1 tablet by mouth daily.     omeprazole 20 MG capsule  Commonly known as:  PRILOSEC  Take 20 mg by mouth daily. For reflux     OVER THE COUNTER MEDICATION  Apply 1 application topically at bedtime as needed. Phys cream. For  neuropathy pain     polyethylene glycol packet  Commonly known as:  MIRALAX / GLYCOLAX  Take 17 g by mouth daily as needed.     senna-docusate 8.6-50 MG per tablet  Commonly known as:  Senokot-S  Take 2 tablets by mouth daily at 6 (six) AM.     tamsulosin 0.4 MG Caps capsule  Commonly known as:  FLOMAX  Take 0.4 mg by mouth daily.       Allergies  Allergen Reactions  . Aleve (Naproxen Sodium)   . Nsaids   . Indocin (Indomethacin)        Follow-up Information   Follow up with MURPHY, TIMOTHY, D, MD. Schedule an appointment as soon as possible for a visit in 2 weeks.   Contact information:  [place] [address] [phone]       Please follow up. (SNF MD in 1-2days)        The results of significant diagnostics from this hospitalization (including imaging, microbiology, ancillary and laboratory) are listed below for reference.    Significant Diagnostic Studies: Dg Chest 1 View  05/04/2013   *RADIOLOGY REPORT*  Clinical Data: Fall, left hip pain  CHEST - 1 VIEW  Comparison: 09/04/2012  Findings: Cardiomediastinal silhouette is stable.  No acute infiltrate or pleural effusion.  No pulmonary edema. Atherosclerotic calcifications of thoracic aorta again noted. Stable degenerative changes thoracic spine.  IMPRESSION: No active disease.  No significant change.   Original Report Authenticated By: Lahoma Crocker, M.D.   Dg Hip Complete Left  05/04/2013   *RADIOLOGY REPORT*  Clinical Data: Hip pain status post fall.  LEFT HIP - COMPLETE 2+ VIEW  Comparison: None.  Findings: The bones appear only mildly demineralized.  However, there is an acute nondisplaced intertrochanteric left femur fracture. Mild left and moderate right hip degenerative changes are noted.  There is no evidence of acute pelvic fracture or dislocation.  IMPRESSION: Nondisplaced intertrochanteric left femur fracture.   Original Report Authenticated By: Richardean Sale, M.D.   Dg Hip  Operative Left  05/05/2013   *RADIOLOGY REPORT*  Clinical Data: ORIF intertrochanteric left femoral neck fracture.  OPERATIVE LEFT HIP 2 VIEWS  Comparison: Left hip x-rays yesterday.  Findings: 3 spot images from the C-arm fluoroscopic device, AP and lateral views of the left hip were submitted for interpretation post-operatively.  Anatomic alignment post ORIF.  No visible complicating features.  IMPRESSION: Anatomic alignment post ORIF left intertrochanteric femoral neck fracture without acute complicating features.   Original Report Authenticated By: Evangeline Dakin, M.D.    Microbiology: Recent Results (from the past 240 hour(s))  SURGICAL PCR SCREEN     Status: None   Collection Time    05/04/13 10:30 PM      Result Value Range Status   MRSA, PCR NEGATIVE  NEGATIVE Final   Staphylococcus aureus NEGATIVE  NEGATIVE Final   Comment:            The Xpert SA Assay (FDA     approved for NASAL specimens     in patients over 49 years of age),     is one component of     a comprehensive surveillance     program.  Test performance has     been validated by [employer] for patients greater     than or equal to 87 year old.     It is not intended     to diagnose infection nor to     guide or monitor treatment.     Labs: Basic Metabolic Panel:  Recent Labs Lab 05/05/13 0515 05/06/13 0500 05/07/13 0500 05/08/13 0515 05/09/13 0518  NA 139 135 137 135 137  K 5.0 4.7 4.8 4.8 4.7  CL 106 102 102 104 104  CO2 25 26 24 22 23   GLUCOSE 107* 128* 119* 116* 115*  BUN 32* 27* 31* 30* 32*  CREATININE 1.57* 1.82* 2.00* 1.70* 1.38*  CALCIUM 9.4 8.9 8.9 8.6 9.1   Liver Function Tests:  Recent Labs Lab 05/04/13 1745  AST 24  ALT 23  ALKPHOS 70  BILITOT 0.3  PROT 6.9  ALBUMIN 3.5   No results found for this basename: LIPASE, AMYLASE,  in the last 168 hours No results found  for this basename: AMMONIA,  in the last 168 hours CBC:  Recent Labs Lab 05/04/13 1745 05/05/13 0515  05/06/13 0500  WBC 8.3 6.4 7.9  NEUTROABS 5.9  --   --   HGB 13.3 12.2* 12.1*  HCT 38.9* 37.4* 36.3*  MCV 99.5 100.3* 100.0  PLT 133* 133* 112*   Cardiac Enzymes:  Recent Labs Lab 05/04/13 1745  TROPONINI <0.30   BNP: BNP (last 3 results) No results found for this basename: PROBNP,  in the last 8760 hours CBG: No results found for this basename: GLUCAP,  in the last 168 hours     Signed:  Dalaya Suppa C  [facility] 05/09/2013, 10:49 AM

## 2013-05-09 NOTE — Progress Notes (Signed)
Pt was bladder scan after being incontinent with results of 750cc. Foley was placed in at this moment as per Dr Dillard Essex. Initial output looks good. Will cont to monitor.

## 2013-05-09 NOTE — Progress Notes (Signed)
Report given to Jeananne Rama, RN from [facility].

## 2013-05-09 NOTE — Progress Notes (Signed)
Patient is set to discharge to [facility] today. Patient & family at bedside aware. Discharge packet in [place]. RN, Leisa Lenz aware. Discharge packet in drawer. [facility] called for transport.   Clinical Social Work Department  CLINICAL SOCIAL WORK PLACEMENT NOTE  05/07/2013  Patient: Drew Gibson Account Number: [id number]  Admit date: 09/03/2012  Clinical Social Worker: Rhea Pink, MSW Date/time: 8/7/201411:30 AM  Clinical Social Work is seeking post-discharge placement for this patient at the following level of care: SKILLED NURSING (*CSW will update this form in Epic as items are completed)  05/07/2013 Patient/family provided with [facility] Department of Clinical Social Work's list of facilities offering this level of care within the geographic area requested by the patient (or if unable, by the patient's family).  05/07/2013 Patient/family informed of their freedom to choose among providers that offer the needed level of care, that participate in Medicare, Medicaid or managed care program needed by the patient, have an available bed and are willing to accept the patient.  05/07/2013 Patient/family informed of [facility]' ownership interest in [facility], as well as of the fact that they are under no obligation to receive care at this facility.  PASARR submitted to EDS on 05/07/2013  PASARR number received from EDS on 05/07/2013  FL2 transmitted to all facilities in geographic area requested by pt/family on  FL2 transmitted to all facilities within larger geographic area on 05/07/2013  Patient informed that his/her managed care company has contracts with or will negotiate with certain facilities, including the following:  Patient/family informed of bed offers received: 05/08/2013  Patient chooses bed at  Physician recommends and patient chooses bed at [facility]  Patient to be transferred to [facility]  on 05/09/13 Patient to be transferred to facility by [facility] The  following physician request were entered in Epic:  Additional Comments:    Winfred Leeds, LCSW Clinical Social Worker cell #: [phone] (weekend coverage)

## 2013-05-11 ENCOUNTER — Non-Acute Institutional Stay (SKILLED_NURSING_FACILITY): Payer: Medicare Other | Admitting: Internal Medicine

## 2013-05-11 DIAGNOSIS — R339 Retention of urine, unspecified: Secondary | ICD-10-CM

## 2013-05-11 DIAGNOSIS — S8290XS Unspecified fracture of unspecified lower leg, sequela: Secondary | ICD-10-CM

## 2013-05-11 DIAGNOSIS — N183 Chronic kidney disease, stage 3 unspecified: Secondary | ICD-10-CM

## 2013-05-11 DIAGNOSIS — I6350 Cerebral infarction due to unspecified occlusion or stenosis of unspecified cerebral artery: Secondary | ICD-10-CM

## 2013-05-11 DIAGNOSIS — S7292XS Unspecified fracture of left femur, sequela: Secondary | ICD-10-CM

## 2013-05-11 DIAGNOSIS — I639 Cerebral infarction, unspecified: Secondary | ICD-10-CM

## 2013-05-21 SURGERY — OPEN REDUCTION INTERNAL FIXATION HIP
Anesthesia: General | Laterality: Left

## 2013-05-22 ENCOUNTER — Other Ambulatory Visit: Payer: Self-pay | Admitting: Geriatric Medicine

## 2013-05-22 MED ORDER — HYDROCODONE-ACETAMINOPHEN 5-325 MG PO TABS: 1.0000 | ORAL_TABLET | Freq: Four times a day (QID) | ORAL | Status: DC | PRN

## 2013-05-29 ENCOUNTER — Non-Acute Institutional Stay (SKILLED_NURSING_FACILITY): Payer: Medicare Other | Admitting: Adult Health

## 2013-05-29 DIAGNOSIS — D649 Anemia, unspecified: Secondary | ICD-10-CM

## 2013-05-29 DIAGNOSIS — G250 Essential tremor: Secondary | ICD-10-CM

## 2013-05-29 DIAGNOSIS — N183 Chronic kidney disease, stage 3 unspecified: Secondary | ICD-10-CM

## 2013-06-02 ENCOUNTER — Non-Acute Institutional Stay (SKILLED_NURSING_FACILITY): Payer: Medicare Other | Admitting: Adult Health

## 2013-06-02 DIAGNOSIS — I6350 Cerebral infarction due to unspecified occlusion or stenosis of unspecified cerebral artery: Secondary | ICD-10-CM

## 2013-06-02 DIAGNOSIS — R339 Retention of urine, unspecified: Secondary | ICD-10-CM | POA: Insufficient documentation

## 2013-06-02 DIAGNOSIS — M109 Gout, unspecified: Secondary | ICD-10-CM

## 2013-06-02 DIAGNOSIS — E875 Hyperkalemia: Secondary | ICD-10-CM

## 2013-06-02 DIAGNOSIS — N400 Enlarged prostate without lower urinary tract symptoms: Secondary | ICD-10-CM

## 2013-06-02 DIAGNOSIS — I10 Essential (primary) hypertension: Secondary | ICD-10-CM

## 2013-06-02 DIAGNOSIS — G250 Essential tremor: Secondary | ICD-10-CM

## 2013-06-02 DIAGNOSIS — K219 Gastro-esophageal reflux disease without esophagitis: Secondary | ICD-10-CM

## 2013-06-02 DIAGNOSIS — I639 Cerebral infarction, unspecified: Secondary | ICD-10-CM

## 2013-06-02 DIAGNOSIS — S7292XD Unspecified fracture of left femur, subsequent encounter for closed fracture with routine healing: Secondary | ICD-10-CM

## 2013-06-02 DIAGNOSIS — S7290XD Unspecified fracture of unspecified femur, subsequent encounter for closed fracture with routine healing: Secondary | ICD-10-CM

## 2013-06-02 DIAGNOSIS — K5900 Constipation, unspecified: Secondary | ICD-10-CM

## 2013-06-02 NOTE — Progress Notes (Signed)
Patient ID: Drew Gibson, male   DOB: 1929-03-07, 79 y.o.   MRN: [id number]        HISTORY & PHYSICAL  DATE: 05/11/2013   FACILITY: [facility]  LEVEL OF CARE: SNF (31)  ALLERGIES:  Allergies  Allergen Reactions  . Aleve [Naproxen Sodium]   . Nsaids   . Indocin [Indomethacin]     CHIEF COMPLAINT:  Manage left femur fracture, chronic kidney disease stage III, and CVA.    HISTORY OF PRESENT ILLNESS:  The patient is an 79 year-old, Caucasian male.    HIP FRACTURE: The patient had a mechanical fall and sustained a femur fracture.  Patient subsequently underwent surgical repair and tolerated the procedure well. Patient is admitted to this facility for short-term rehabilitation. Patient denies hip pain currently. No complications reported from the pain medications currently being used.    CVA: The patient's CVA remains stable.  Patient denies new neurologic symptoms such as numbness, tingling, weakness, speech difficulties or visual disturbances.  No complications reported from the medications currently being used.    CHRONIC KIDNEY DISEASE: The patient's chronic kidney disease remains stable.  Patient denies increasing lower extremity swelling or confusion. Last BUN and creatinine are:   Creatinine 1.38.    PAST MEDICAL HISTORY :  Past Medical History  Diagnosis Date  . Gout     takes Allopurinol daily  . Peripheral neuropathy   . Enlarged prostate     takes Tamsulosin daily  . Hypertension     takes Enalapril daily   . Hyperlipidemia     but doesn't require meds  . Pneumonia     hx of;last time a yr ago  . Peripheral neuropathy   . TIA (transient ischemic attack)     sees Dr.Willis-neurologist  . Urinary frequency   . Urinary urgency   . Macular degeneration     takes eye cap vits daily  . Foot drop, bilateral   . Gait disorder   . Degenerative arthritis   . Ptosis     Left side  . Chronic kidney disease, stage III (moderate)   . Inguinal hernia  without mention of obstruction or gangrene, unilateral or unspecified, (not specified as recurrent)   . Other abnormal blood chemistry   . Spinal stenosis, lumbar region, with neurogenic claudication   . Unspecified vitamin D deficiency   . Essential and other specified forms of tremor   . Abnormality of gait   . Gout, unspecified   . Encounter for long-term (current) use of other medications   . Internal hemorrhoids without mention of complication   . Benign neoplasm of colon   . Diverticulosis of colon (without mention of hemorrhage)   . Duodenal ulcer, unspecified as acute or chronic, without hemorrhage, perforation, or obstruction     PAST SURGICAL HISTORY: Past Surgical History  Procedure Laterality Date  . Hernia repair      at least 62yrs ago  . Left leg surgery  2011  . Colonosocpy    . Inguinal hernia repair  09/05/2012    Procedure: LAPAROSCOPIC INGUINAL HERNIA;  Surgeon: Gayland Curry, MD,FACS;  Location: [place];  Service: General;  Laterality: Left;  . Insertion of mesh  09/05/2012    Procedure: INSERTION OF MESH;  Surgeon: Gayland Curry, MD,FACS;  Location: [place];  Service: General;  Laterality: Left;  . Finger surgery Left     Left ring finger  . Nerve biopsy    . Hip pinning,cannulated  Left 05/05/2013    Procedure: CANNULATED HIP PINNING- left;  Surgeon: Renette Butters, MD;  Location: [place];  Service: Orthopedics;  Laterality: Left;    SOCIAL HISTORY:  reports that he has quit smoking. He does not have any smokeless tobacco history on file. He reports that he does not drink alcohol or use illicit drugs.  FAMILY HISTORY:  Family History  Problem Relation Age of Onset  . Diabetes Mother     type 2  . Leukemia Father   . COPD Brother   . Cancer Sister     CURRENT MEDICATIONS: Reviewed per [facility]  REVIEW OF SYSTEMS:  See HPI otherwise 14 point ROS is negative.  PHYSICAL EXAMINATION  VS:  T 97.8      P 70      RR 16      BP 105/68      POX%        WT  (Lb)  GENERAL: no acute distress, normal body habitus SKIN: warm & dry, no suspicious lesions or rashes, no excessive dryness, left hip incision clean and dry, staples are in place  EYES: conjunctivae normal, sclerae normal, normal eye lids MOUTH/THROAT: lips without lesions,no lesions in the mouth,tongue is without lesions,uvula elevates in midline NECK: supple, trachea midline, no neck masses, no thyroid tenderness, no thyromegaly LYMPHATICS: no LAN in the neck, no supraclavicular LAN RESPIRATORY: breathing is even & unlabored, BS CTAB CARDIAC: RRR, no murmur,no extra heart sounds, no edema GI:  ABDOMEN: abdomen soft, normal BS, no masses, no tenderness  LIVER/SPLEEN: no hepatomegaly, no splenomegaly MUSCULOSKELETAL: HEAD: normal to inspection & palpation BACK: no kyphosis, scoliosis or spinal processes tenderness EXTREMITIES: LEFT UPPER EXTREMITY: full range of motion, normal strength & tone RIGHT UPPER EXTREMITY:  full range of motion, normal strength & tone LEFT LOWER EXTREMITY: strength unable to perform, range of motion not tested due to surgery   RIGHT LOWER EXTREMITY: strength unable to perform, range of motion moderate  PSYCHIATRIC: the patient is alert & oriented to person, affect & behavior appropriate  LABS/RADIOLOGY: Chest x-ray:  No acute disease.    Left hip x-ray showed nondisplaced intertrochanteric left femur fracture.    Left hip x-ray postsurgically showed ORIF of left hip intertrochanteric femoral neck fracture without any acute findings.      MRSA by PCR negative.     Staph aureus by PCR negative.    Glucose 115, creatinine 1.38, otherwise BMP normal.    Liver profile normal.    Hemoglobin 12.1, MCV 100, otherwise CBC normal.    Troponin-I less than 0.03.    ASSESSMENT/PLAN:    Left femur fracture.  Status post ORIF.  Continue rehabilitation.    Chronic kidney disease stage III.  Reassess renal functions.    CVA.  Continue supportive care.     Urinary retention.  We will do a voiding trial.  If fails, will need Urology follow-up.    Hypertension.  Well controlled.    Gout.  Continue allopurinol.    Constipation.  Well controlled.    Check CBC and BMP.    I have reviewed patient's medical records received at admission/from hospitalization.  CPT CODE: 91478

## 2013-06-11 ENCOUNTER — Non-Acute Institutional Stay (SKILLED_NURSING_FACILITY): Payer: Medicare Other | Admitting: Adult Health

## 2013-06-11 DIAGNOSIS — S7290XD Unspecified fracture of unspecified femur, subsequent encounter for closed fracture with routine healing: Secondary | ICD-10-CM

## 2013-06-11 DIAGNOSIS — M109 Gout, unspecified: Secondary | ICD-10-CM

## 2013-06-11 DIAGNOSIS — N400 Enlarged prostate without lower urinary tract symptoms: Secondary | ICD-10-CM

## 2013-06-11 DIAGNOSIS — I6350 Cerebral infarction due to unspecified occlusion or stenosis of unspecified cerebral artery: Secondary | ICD-10-CM

## 2013-06-11 DIAGNOSIS — N183 Chronic kidney disease, stage 3 unspecified: Secondary | ICD-10-CM

## 2013-06-11 DIAGNOSIS — G250 Essential tremor: Secondary | ICD-10-CM

## 2013-06-11 DIAGNOSIS — I639 Cerebral infarction, unspecified: Secondary | ICD-10-CM

## 2013-06-11 DIAGNOSIS — K5900 Constipation, unspecified: Secondary | ICD-10-CM

## 2013-06-11 DIAGNOSIS — K219 Gastro-esophageal reflux disease without esophagitis: Secondary | ICD-10-CM

## 2013-06-11 DIAGNOSIS — S7292XG Unspecified fracture of left femur, subsequent encounter for closed fracture with delayed healing: Secondary | ICD-10-CM

## 2013-06-11 DIAGNOSIS — R339 Retention of urine, unspecified: Secondary | ICD-10-CM

## 2013-06-11 DIAGNOSIS — I10 Essential (primary) hypertension: Secondary | ICD-10-CM

## 2013-06-15 ENCOUNTER — Encounter: Payer: Self-pay | Admitting: Nurse Practitioner

## 2013-06-15 ENCOUNTER — Encounter: Payer: Self-pay | Admitting: Adult Health

## 2013-06-15 ENCOUNTER — Ambulatory Visit (INDEPENDENT_AMBULATORY_CARE_PROVIDER_SITE_OTHER): Payer: Medicare Other | Admitting: Nurse Practitioner

## 2013-06-15 VITALS — BP 122/70 | HR 76 | Temp 97.7°F

## 2013-06-15 DIAGNOSIS — R5381 Other malaise: Secondary | ICD-10-CM

## 2013-06-15 DIAGNOSIS — I10 Essential (primary) hypertension: Secondary | ICD-10-CM | POA: Insufficient documentation

## 2013-06-15 DIAGNOSIS — K5900 Constipation, unspecified: Secondary | ICD-10-CM | POA: Insufficient documentation

## 2013-06-15 DIAGNOSIS — K219 Gastro-esophageal reflux disease without esophagitis: Secondary | ICD-10-CM | POA: Insufficient documentation

## 2013-06-15 DIAGNOSIS — E875 Hyperkalemia: Secondary | ICD-10-CM | POA: Insufficient documentation

## 2013-06-15 DIAGNOSIS — G250 Essential tremor: Secondary | ICD-10-CM | POA: Insufficient documentation

## 2013-06-15 DIAGNOSIS — D649 Anemia, unspecified: Secondary | ICD-10-CM | POA: Insufficient documentation

## 2013-06-15 NOTE — Patient Instructions (Addendum)
Will have home health nursing to change foley catheter and obtain sample; please bring this back to the office as soon as possible (keep in the fridge until able to bring in)   Make sure to always have catheter bag below the level of the bladder and clean insertion site with clean soapy water twice daily  Please make routine follow up in 3-4 weeks to follow up from nursing home

## 2013-06-15 NOTE — Progress Notes (Signed)
Patient ID: Drew Gibson, male   DOB: 1929/07/24, 79 y.o.   MRN: [id number]       PROGRESS NOTE  DATE: 06/11/2013   FACILITY: [facility]  LEVEL OF CARE: SNF (31)   CHIEF COMPLAINT:  Discharge Visit  HISTORY OF PRESENT ILLNESS: This is an 79 year old male who is for discharge home with home health PT, OT and nursing. He is going to be discharged with Foley catheter and will follow up with urologist. He has been admitted to [facility] on 05/09/13 from [facility] with left femoral fracture status post ORIF. Patient was admitted to this facility for short-term rehabilitation.  Patient has completed SNF rehabilitation and therapy has cleared the patient for discharge.  Reassessment of ongoing problem(s):  HTN: Pt 's HTN remains stable.  Denies CP, sob, DOE, pedal edema, headaches, dizziness or visual disturbances.  No medications currently being used.  Last BP : 109/73  GOUT: Patien'st  gout remains stable. Patient denies joint pan, redness, swelling or warmth. No complications reported from the medications presently being used.  BPH: The patient's BPH remains stable. Patient denies urinary hesitancy or dribbling. No complications reported from the current medications being used. PAST MEDICAL HISTORY : Reviewed.  No changes.  CURRENT MEDICATIONS: Reviewed per [facility]  REVIEW OF SYSTEMS:  GENERAL: no change in appetite, no fatigue, no weight changes, no fever, chills or weakness RESPIRATORY: no cough, SOB, DOE, wheezing, hemoptysis CARDIAC: no chest pain, edema or palpitations GI: no abdominal pain, diarrhea, constipation, heart burn, nausea or vomiting  PHYSICAL EXAMINATION  VS:  T 97.2      P 58       RR 18      BP 109/73      POX 95 %       WT 148.8 (Lb)  GENERAL: no acute distress, normal body habitus EYES: conjunctivae normal, sclerae normal, normal eye lids NECK: supple, trachea midline, no neck masses, no thyroid tenderness, no thyromegaly LYMPHATICS:  no LAN in the neck, no supraclavicular LAN RESPIRATORY: breathing is even & unlabored, BS CTAB CARDIAC: RRR, no murmur,no extra heart sounds, no edema GI: abdomen soft, normal BS, no masses, no tenderness, no hepatomegaly, no splenomegaly PSYCHIATRIC: the patient is alert & oriented to person, affect & behavior appropriate  LABS/RADIOLOGY: 06/03/13 sodium 134 potassium 4.5 glucose 129 BUN 43 creatinine 1.4 calcium 9.7 05/29/13 sodium 131 potassium 5.5 glucose 92 BUN 65 creatinine 2.1 calcium 9.2 05/28/13 sodium 129 potassium 5.1 glucose 124 BUN 63 creatinine 2.2 calcium 9.2 WBC 6.9 hemoglobin 10.1 hematocrit 32.7 05/22/13 chest x-ray shows right lower lobe infiltrate 05/21/13 WBC 13.4 hemoglobin 11.3 hematocrit 36.9 sodium 134 potassium 4.7 glucose 112 BUN 43 creatinine 1.6 calcium 9.5 05/12/13 WBC 7.9 hemoglobin 10.4 hematocrit 33.6   ASSESSMENT/PLAN:  CVA , history - stable   Closed left femoral fracture status post ORIF  - for home health Nursing, PT and OT  Essential tremors - recently started on Primidone; tremors better  GERD - stable  Hypertension - recently discontinued Vasotec; BPs has been stable  Constipation - no complaints  BPH - stable  Urinary retention - has Foley catheter; followed up by urologist in 2 weeks  Gout - no complaints  CKD, stage III - BMP in 2 weeks  I have filled out patient's discharge paperwork and written prescriptions.  Patient will receive home health PT, OT and Nursing.    Total discharge time: Greater than 30 minutes Discharge time involved coordination  of the discharge process with Education officer, museum, nursing staff and therapy department. Medical justification for home health services verified.  CPT CODE: 29562

## 2013-06-15 NOTE — Progress Notes (Signed)
Patient ID: Drew Gibson, male   DOB: 10-16-1928, 79 y.o.   MRN: [id number]       PROGRESS NOTE  DATE: 05/29/2013  FACILITY:  [facility]  LEVEL OF CARE: SNF (31)  Acute Visit  CHIEF COMPLAINT:  Manage essential tremors and CKD. stage III  HISTORY OF PRESENT ILLNESS: This is an 79 year old male who complained of tremors on his hands and fingers which make it difficult for him to hold a cup. Patient complained that his tremors make it difficult for him to eat. Noted BUN/creatinine to be elevated - BUN 63 creatinine 2.2 (previously, 05/21/13, BUN 43 creatinine 1.6). Patient is currently on Vasotec. BPs has been noted to be stable. His hemoglobin is noted to be low - 10.1  . No complaints of dizziness, fatigue nor shortness of breath.  PAST MEDICAL HISTORY : Reviewed.  No changes.  CURRENT MEDICATIONS: Reviewed per [facility]  REVIEW OF SYSTEMS:  GENERAL: no change in appetite, no fatigue, no weight changes, no fever, chills or weakness RESPIRATORY: no cough, SOB, DOE,, wheezing, hemoptysis CARDIAC: no chest pain, edema or palpitations GI: no abdominal pain, diarrhea, constipation, heart burn, nausea or vomiting  PHYSICAL EXAMINATION  VS:  T 98        P 98       RR 16       BP 123/65      POX 95 %       WT 161.2 (Lb)  GENERAL: no acute distress, normal body habitus NECK: supple, trachea midline, no neck masses, no thyroid tenderness, no thyromegaly LYMPHATICS: no LAN in the neck, no supraclavicular LAN RESPIRATORY: breathing is even & unlabored, BS CTAB CARDIAC: RRR, no murmur,no extra heart sounds, no edema GI: abdomen soft, normal BS, no masses, no tenderness, no hepatomegaly, no splenomegaly PSYCHIATRIC: the patient is alert & oriented to person, affect & behavior appropriate  LABS/RADIOLOGY: 05/28/13 sodium 129 potassium 5.1 glucose 124 BUN 63 creatinine 2.2 calcium 9.2 WBC 6.9 hemoglobin 10.1 hematocrit 32.7 05/22/13 chest x-ray shows right lower lobe  infiltrate 05/21/13 WBC 13.4 hemoglobin 11.3 hematocrit 36.9 sodium 134 potassium 4.7 glucose 112 BUN 43 creatinine 1.6 calcium 9.5 05/12/13 WBC 7.9 hemoglobin 10.4 hematocrit 33.6  ASSESSMENT/PLAN:  Essential tremors - New; start Primidone 25 mg by mouth each bedtime   CKD, stage III  -  BUN/creatinine has been noted to be elevated; BMP on 06/02/13  Hypertension  - discontinue Vasotec; BP/heart rate every shift x1 week   Anemia - check serum iron, folate and B12  CPT CODE: 16109

## 2013-06-15 NOTE — Progress Notes (Signed)
Patient ID: Drew [facility], male   DOB: 1929-02-19, 79 y.o.   MRN: [id number]   Allergies  Allergen Reactions  . Aleve [Naproxen Sodium]   . Nsaids   . Indocin [Indomethacin]     Chief Complaint  Patient presents with  . URI    cough, congestion and low grade fever (99.3-at home Saturday)x 3 days     HPI: Patient is a 79 y.o. male seen in the office today for cough and feeling bad Has been at [facility] and was having fevers (pt previously treated for pneumonia and UTI) treated with Levaquin for 10 days and now has currently been off antibiotic for 10 days.  Cough at night started back last week and the past 2 days he has had a long grade fever (~99) and  Has not had one today. Nonproductive cough,  Does not have significant congestion in sinuses or chest. No shortness of breath.  Slept on side last night and reported he did not have a cough and does not have cough today However pt is with indwelling foley due to BPH and urinary retention; was unable to void post op and failed voiding trail in NH; now following with urology Had diarrhea this morning times 1 episode, no n/v  Review of Systems:  Review of Systems  Constitutional: Positive for fever and malaise/fatigue. Negative for chills.  Respiratory: Negative for cough, shortness of breath and wheezing.   Cardiovascular: Negative for chest pain, palpitations and leg swelling.  Gastrointestinal: Positive for diarrhea. Negative for heartburn, nausea, vomiting, abdominal pain and constipation.  Genitourinary:       No suprapubic discomfort; with foley cathether  Neurological: Positive for weakness. Negative for headaches.    Past Medical History  Diagnosis Date  . Gout     takes Allopurinol daily  . Peripheral neuropathy   . Enlarged prostate     takes Tamsulosin daily  . Hypertension     takes Enalapril daily   . Hyperlipidemia     but doesn't require meds  . Pneumonia     hx of;last time a yr ago  . Peripheral neuropathy    . TIA (transient ischemic attack)     sees Dr.Willis-neurologist  . Urinary frequency   . Urinary urgency   . Macular degeneration     takes eye cap vits daily  . Foot drop, bilateral   . Gait disorder   . Degenerative arthritis   . Ptosis     Left side  . Chronic kidney disease, stage III (moderate)   . Inguinal hernia without mention of obstruction or gangrene, unilateral or unspecified, (not specified as recurrent)   . Other abnormal blood chemistry   . Spinal stenosis, lumbar region, with neurogenic claudication   . Unspecified vitamin D deficiency   . Essential and other specified forms of tremor   . Abnormality of gait   . Gout, unspecified   . Encounter for long-term (current) use of other medications   . Internal hemorrhoids without mention of complication   . Benign neoplasm of colon   . Diverticulosis of colon (without mention of hemorrhage)   . Duodenal ulcer, unspecified as acute or chronic, without hemorrhage, perforation, or obstruction    Past Surgical History  Procedure Laterality Date  . Hernia repair      at least 48yrs ago  . Left leg surgery  2011  . Colonosocpy    . Inguinal hernia repair  09/05/2012    Procedure: LAPAROSCOPIC INGUINAL HERNIA;  Surgeon: Gayland Curry, MD,FACS;  Location: [place];  Service: General;  Laterality: Left;  . Insertion of mesh  09/05/2012    Procedure: INSERTION OF MESH;  Surgeon: Gayland Curry, MD,FACS;  Location: [place];  Service: General;  Laterality: Left;  . Finger surgery Left     Left ring finger  . Nerve biopsy    . Hip pinning,cannulated Left 05/05/2013    Procedure: CANNULATED HIP PINNING- left;  Surgeon: Renette Butters, MD;  Location: [place];  Service: Orthopedics;  Laterality: Left;   Social History:   reports that he has quit smoking. He does not have any smokeless tobacco history on file. He reports that he does not drink alcohol or use illicit drugs.  Family History  Problem Relation Age of Onset  . Diabetes  Mother     type 2  . Leukemia Father   . COPD Brother   . Cancer Sister     Medications: Patient's Medications  New Prescriptions   No medications on file  Previous Medications   ALLOPURINOL (ZYLOPRIM) 100 MG TABLET    Take 100 mg by mouth 2 (two) times daily.    B COMPLEX VITAMINS CAPSULE    Take 1 capsule by mouth daily.     CHOLECALCIFEROL (VITAMIN D) 1000 UNITS TABLET    Take 2,000 Units by mouth daily.    CLOPIDOGREL (PLAVIX) 75 MG TABLET    Take 75 mg by mouth daily.   MULTIPLE VITAMINS-MINERALS (MULTIVITAMINS THER. W/MINERALS) TABS    Take 1 tablet by mouth daily.     NON FORMULARY    Rapid Flo- 1 by mouth in the am,  rx'ed by Urologist   OVER THE COUNTER MEDICATION    Apply 1 application topically at bedtime as needed. Phys cream. For neuropathy pain   PRIMIDONE (MYSOLINE) 50 MG TABLET    Take 50 mg by mouth. 1/2 by mouth in the evening  Modified Medications   No medications on file  Discontinued Medications   BISACODYL (DULCOLAX) 10 MG SUPPOSITORY    Place 1 suppository (10 mg total) rectally as needed.   ENALAPRIL (VASOTEC) 5 MG TABLET    Take 5 mg by mouth daily.   HYDROCODONE-ACETAMINOPHEN (NORCO) 5-325 MG PER TABLET    Take 1 tablet by mouth every 6 (six) hours as needed for pain.   OMEPRAZOLE (PRILOSEC) 20 MG CAPSULE    Take 20 mg by mouth daily. For reflux   POLYETHYLENE GLYCOL (MIRALAX / GLYCOLAX) PACKET    Take 17 g by mouth daily as needed.   SENNA-DOCUSATE (SENOKOT-S) 8.6-50 MG PER TABLET    Take 2 tablets by mouth daily at 6 (six) AM.   TAMSULOSIN HCL (FLOMAX) 0.4 MG CAPS    Take 0.4 mg by mouth daily.       Physical Exam:  Filed Vitals:   06/15/13 1508  BP: 122/70  Pulse: 76  Temp: 97.7 F (36.5 C)  TempSrc: Oral  SpO2: 95%    Physical Exam  Vitals reviewed. Constitutional: He is well-developed, well-nourished, and in no distress. No distress.  HENT:  Mouth/Throat: Oropharynx is clear and moist. No oropharyngeal exudate.  Cardiovascular: Normal  rate, regular rhythm and normal heart sounds.   Pulmonary/Chest: Effort normal and breath sounds normal. No respiratory distress. He has no wheezes. He has no rales.  Abdominal: Soft. Bowel sounds are normal. He exhibits no distension. There is no tenderness.  Genitourinary:  Urine amber and cloudy with increase sediment  Musculoskeletal: He exhibits no edema and no tenderness.  Neurological: He is alert.  Skin: Skin is warm and dry. He is not diaphoretic. No erythema.     Labs reviewed: Basic Metabolic Panel:  Recent Labs  05/07/13 0500 05/08/13 0515 05/09/13 0518  NA 137 135 137  K 4.8 4.8 4.7  CL 102 104 104  CO2 24 22 23   GLUCOSE 119* 116* 115*  BUN 31* 30* 32*  CREATININE 2.00* 1.70* 1.38*  CALCIUM 8.9 8.6 9.1   Liver Function Tests:  Recent Labs  05/04/13 1745  AST 24  ALT 23  ALKPHOS 70  BILITOT 0.3  PROT 6.9  ALBUMIN 3.5   No results found for this basename: LIPASE, AMYLASE,  in the last 8760 hours No results found for this basename: AMMONIA,  in the last 8760 hours CBC:  Recent Labs  09/04/12 1426 05/04/13 1745 05/05/13 0515 05/06/13 0500  WBC 7.8 8.3 6.4 7.9  NEUTROABS 4.1 5.9  --   --   HGB 12.8* 13.3 12.2* 12.1*  HCT 39.1 38.9* 37.4* 36.3*  MCV 99.2 99.5 100.3* 100.0  PLT 180 133* 133* 112*   Lipid Panel:  Recent Labs  04/21/13 0957  HDL 54  LDLCALC 156*  TRIG 96    Assessment/Plan 1. Other malaise and fatigue With low grade temp; most likely UTI; Will get blood work today; to have home health change foley catheter and UA C&S from new bag; encouraged increase in hydration To seek medical attention if fever gets worse; change in mentation; worsening of symptoms  - Comprehensive metabolic panel - CBC With differential/Platelet

## 2013-06-15 NOTE — Progress Notes (Signed)
Patient ID: Drew [facility], male   DOB: Mar 02, 1929, 79 y.o.   MRN: [id number]        PROGRESS NOTE  DATE: 06/02/2013  FACILITY: Nursing Home Location: [facility]  LEVEL OF CARE: SNF (31)  Routine Visit  CHIEF COMPLAINT:  Manage essential tremors,Hypertension, BPH and urinary retention  HISTORY OF PRESENT ILLNESS:  REASSESSMENT OF ONGOING PROBLEM(S):  HTN: Pt 's HTN remains stable.  Denies CP, sob, DOE, pedal edema, headaches, dizziness or visual disturbances.  No complications from the medications currently being used.  Last BP : 120/72  GOUT: Patien'st  gout remains stable. Patient denies joint pan, redness, swelling or warmth. No complications reported from the medications presently being used.  GERD: pt's GERD is stable.  Denies ongoing heartburn, abd. Pain, nausea or vomiting.  Currently on a PPI & tolerates it without any adverse reactions.  PAST MEDICAL HISTORY : Reviewed.  No changes.  CURRENT MEDICATIONS: Reviewed per [facility]  REVIEW OF SYSTEMS:  GENERAL: no change in appetite, no fatigue, no weight changes, no fever, chills or weakness RESPIRATORY: no cough, SOB, DOE, wheezing, hemoptysis CARDIAC: no chest pain, edema or palpitations GI: no abdominal pain, diarrhea, constipation, heart burn, nausea or vomiting  PHYSICAL EXAMINATION  VS:  T 97.7      P 96      RR 18      BP 120/72     POX 95 %     WT 148.2 (Lb)  GENERAL: no acute distress, normal body habitus EYES: conjunctivae normal, sclerae normal, normal eye lids LYMPHATICS: no LAN in the neck, no supraclavicular LAN RESPIRATORY: breathing is even & unlabored, BS CTAB CARDIAC: RRR, no murmur,no extra heart sounds, no edema GI: abdomen soft, normal BS, no masses, no tenderness, no hepatomegaly, no splenomegaly PSYCHIATRIC: the patient is alert & oriented to person, affect & behavior appropriate  LABS/RADIOLOGY: 05/29/13 sodium 131 potassium 5.5 glucose 92 BUN 65 creatinine 2.1 calcium  9.2 05/28/13 sodium 129 potassium 5.1 glucose 124 BUN 63 creatinine 2.2 calcium 9.2 WBC 6.9 hemoglobin 10.1 hematocrit 32.7 05/22/13 chest x-ray shows right lower lobe infiltrate 05/21/13 WBC 13.4 hemoglobin 11.3 hematocrit 36.9 sodium 134 potassium 4.7 glucose 112 BUN 43 creatinine 1.6 calcium 9.5 05/12/13 WBC 7.9 hemoglobin 10.4 hematocrit 33.6   ASSESSMENT/PLAN:  Hyperkalemia - give Kayexalate 15 g per 60 mL by mouth x1; BMP in a.m.  CVA , history - stable   Closed left femoral fracture status post ORIF  - continue PT and OT  Essential tremors - recently started on Primidone  GERD - stable  Hypertension - recently discontinued Vasotec; BPs has been stable  Constipation - no complaints  BPH - stable  Urinary retention - has Foley catheter; followed up by urologist  Gout - no complaints   CPT CODE: 19147

## 2013-06-16 ENCOUNTER — Other Ambulatory Visit: Payer: Medicare Other

## 2013-06-16 ENCOUNTER — Other Ambulatory Visit: Payer: Self-pay | Admitting: *Deleted

## 2013-06-16 DIAGNOSIS — N390 Urinary tract infection, site not specified: Secondary | ICD-10-CM

## 2013-06-16 LAB — CBC WITH DIFFERENTIAL
Eos: 5 %
HCT: 30.3 % — ABNORMAL LOW (ref 37.5–51.0)
Immature Granulocytes: 1 %
Lymphocytes Absolute: 2.6 10*3/uL (ref 0.7–3.1)
Lymphs: 22 %
MCV: 90 fL (ref 79–97)
Monocytes: 7 %
Neutrophils Absolute: 7.4 10*3/uL — ABNORMAL HIGH (ref 1.4–7.0)
RBC: 3.35 x10E6/uL — ABNORMAL LOW (ref 4.14–5.80)
WBC: 11.5 10*3/uL — ABNORMAL HIGH (ref 3.4–10.8)

## 2013-06-16 LAB — COMPREHENSIVE METABOLIC PANEL
ALT: 36 IU/L (ref 0–44)
Albumin: 3.4 g/dL — ABNORMAL LOW (ref 3.5–4.7)
BUN: 46 mg/dL — ABNORMAL HIGH (ref 8–27)
CO2: 23 mmol/L (ref 18–29)
Calcium: 9.5 mg/dL (ref 8.6–10.2)
Chloride: 92 mmol/L — ABNORMAL LOW (ref 97–108)
Glucose: 87 mg/dL (ref 65–99)
Potassium: 5 mmol/L (ref 3.5–5.2)
Total Protein: 6.5 g/dL (ref 6.0–8.5)

## 2013-06-17 LAB — URINALYSIS
Bilirubin, UA: NEGATIVE
Glucose, UA: NEGATIVE
Urobilinogen, Ur: 0.2 mg/dL (ref 0.0–1.9)
pH, UA: 6 (ref 5.0–7.5)

## 2013-06-18 LAB — URINE CULTURE

## 2013-06-19 ENCOUNTER — Telehealth: Payer: Self-pay

## 2013-06-19 MED ORDER — SULFAMETHOXAZOLE-TMP DS 800-160 MG PO TABS: 1.0000 | ORAL_TABLET | Freq: Two times a day (BID) | ORAL | Status: DC

## 2013-06-19 NOTE — Telephone Encounter (Signed)
Message copied by Logan Bores on Fri Jun 19, 2013  9:24 AM ------      Message from: Pricilla Larsson      Created: Fri Jun 19, 2013  8:59 AM       Please let pt know he does have UTI; lets start bactrim DS by mouth twice daily for 7 days; and pt to take probiotic while on antibiotic; also drink plenty of water ------

## 2013-06-19 NOTE — Telephone Encounter (Signed)
Discussed with patient and his wife, both verbalized understanding. RX sent to pharmacy on file

## 2013-07-07 ENCOUNTER — Encounter: Payer: Self-pay | Admitting: Internal Medicine

## 2013-07-07 ENCOUNTER — Ambulatory Visit (INDEPENDENT_AMBULATORY_CARE_PROVIDER_SITE_OTHER): Payer: Medicare Other | Admitting: Internal Medicine

## 2013-07-07 VITALS — BP 122/70 | HR 83 | Temp 98.4°F

## 2013-07-07 DIAGNOSIS — D649 Anemia, unspecified: Secondary | ICD-10-CM

## 2013-07-07 DIAGNOSIS — S7290XD Unspecified fracture of unspecified femur, subsequent encounter for closed fracture with routine healing: Secondary | ICD-10-CM

## 2013-07-07 DIAGNOSIS — S7292XD Unspecified fracture of left femur, subsequent encounter for closed fracture with routine healing: Secondary | ICD-10-CM

## 2013-07-07 DIAGNOSIS — Z23 Encounter for immunization: Secondary | ICD-10-CM

## 2013-07-07 DIAGNOSIS — R269 Unspecified abnormalities of gait and mobility: Secondary | ICD-10-CM

## 2013-07-07 DIAGNOSIS — N400 Enlarged prostate without lower urinary tract symptoms: Secondary | ICD-10-CM

## 2013-07-07 DIAGNOSIS — R339 Retention of urine, unspecified: Secondary | ICD-10-CM

## 2013-07-07 DIAGNOSIS — I10 Essential (primary) hypertension: Secondary | ICD-10-CM

## 2013-07-07 DIAGNOSIS — N183 Chronic kidney disease, stage 3 unspecified: Secondary | ICD-10-CM

## 2013-07-07 DIAGNOSIS — M109 Gout, unspecified: Secondary | ICD-10-CM

## 2013-07-07 DIAGNOSIS — M48062 Spinal stenosis, lumbar region with neurogenic claudication: Secondary | ICD-10-CM

## 2013-07-07 DIAGNOSIS — E785 Hyperlipidemia, unspecified: Secondary | ICD-10-CM

## 2013-07-07 DIAGNOSIS — G63 Polyneuropathy in diseases classified elsewhere: Secondary | ICD-10-CM

## 2013-07-07 DIAGNOSIS — G250 Essential tremor: Secondary | ICD-10-CM

## 2013-07-07 MED ORDER — PRIMIDONE 50 MG PO TABS: ORAL_TABLET | ORAL | Status: DC

## 2013-07-07 MED ORDER — SILODOSIN 8 MG PO CAPS: ORAL_CAPSULE | ORAL | Status: DC

## 2013-07-07 NOTE — Progress Notes (Signed)
Subjective:    Patient ID: Drew Gibson, male    DOB: 1929/06/03, 79 y.o.   MRN: [id number]  Chief Complaint  Patient presents with  . Medical Managment of Chronic Issues    3-4 week follow-up  . Muscle Pain    ? pulled muscle in left leg, Onset 05/03/2013   . Cath    patient with particles in Urine Cath  . Labs Only    ? if kidney function should be rechecked     HPI Urinary retention: persistent issue since his hospitalization for fx femur. Failed removal of catheter twice. under care of urologist.  Anemia : present since his surgery for fx femur  Essential and other specified forms of tremor - improved on  primidone (MYSOLINE) 50 MG tablet  Closed left femoral fracture, with routine healing, subsequent encounter: no signs of infection  BPH (benign prostatic hyperplasia) - Associated with his urinary retention. Now on silodosin (RAPAFLO) 8 MG CAPS capsule  CKD (chronic kidney disease), stage III: chronic and unchanged  Abnormality of gait: multifactorial--Fx femur, polyneuropathy  Hyperlipidemia; controlled  Polyneuropathy in other diseases classified elsewhere: unchanged  Hypertension: controlled  Gout - non recent attacks  Spinal stenosis, lumbar region, with neurogenic claudication: unchanged  Need for prophylactic vaccination and inoculation against influenza    Current Outpatient Prescriptions on File Prior to Visit  Medication Sig Dispense Refill  . allopurinol (ZYLOPRIM) 100 MG tablet Take 100 mg by mouth 2 (two) times daily.       Marland Kitchen b complex vitamins capsule Take 1 capsule by mouth daily.        . cholecalciferol (VITAMIN D) 1000 UNITS tablet Take 2,000 Units by mouth daily.       . clopidogrel (PLAVIX) 75 MG tablet Take 75 mg by mouth daily.      . Multiple Vitamins-Minerals (MULTIVITAMINS THER. W/MINERALS) TABS Take 1 tablet by mouth daily.        Marland Kitchen OVER THE COUNTER MEDICATION Apply 1 application topically at bedtime as needed. Phys cream. For  neuropathy pain       No current facility-administered medications on file prior to visit.    Review of Systems  Constitutional: Positive for fatigue. Negative for fever.  HENT: Negative.   Eyes: Positive for visual disturbance (corrective lenses).  Respiratory: Negative.   Cardiovascular: Negative for chest pain, palpitations and leg swelling.  Gastrointestinal: Negative.   Endocrine: Negative.   Genitourinary: Positive for frequency and difficulty urinating.       Catheterized due to obstructive uropathy from BPH. Prior issues with nocturia and urgency.  Musculoskeletal:       S/P left hip fx  Skin: Negative.   Neurological:       Polyneuropathy. Bilateral foot drop.  Hematological:       Anemia.  Psychiatric/Behavioral: Negative.        Objective:   Physical Exam  Constitutional: He is oriented to person, place, and time.  Thin, frail   HENT:  Head: Normocephalic and atraumatic.  Right Ear: External ear normal.  Left Ear: External ear normal.  Nose: Nose normal.  Eyes: Conjunctivae and EOM are normal. Pupils are equal, round, and reactive to light.  Neck: No JVD present. No tracheal deviation present. No thyromegaly present.  Cardiovascular: Normal rate, regular rhythm, normal heart sounds and intact distal pulses.  Exam reveals no gallop and no friction rub.   No murmur heard. Pulmonary/Chest: No respiratory distress. He has no wheezes. He has no rales. He exhibits  no tenderness.  Abdominal: He exhibits no distension and no mass. There is no tenderness.  Musculoskeletal: He exhibits no edema and no tenderness.  Prior left hip fx. Healed incision. Unstable gait. Using walker.  Lymphadenopathy:    He has no cervical adenopathy.  Neurological: He is alert and oriented to person, place, and time. No cranial nerve deficit. Coordination normal.  Skin: No rash noted. No erythema. No pallor.  Psychiatric: He has a normal mood and affect. His behavior is normal. Judgment  and thought content normal.      Office Visit on 07/07/2013  Component Date Value Range Status  . WBC 07/07/2013 8.9  3.4 - 10.8 x10E3/uL Final  . RBC 07/07/2013 3.69* 4.14 - 5.80 x10E6/uL Final  . Hemoglobin 07/07/2013 11.6* 12.6 - 17.7 g/dL Final  . HCT 07/07/2013 35.2* 37.5 - 51.0 % Final  . MCV 07/07/2013 95  79 - 97 fL Final  . MCH 07/07/2013 31.4  26.6 - 33.0 pg Final  . MCHC 07/07/2013 33.0  31.5 - 35.7 g/dL Final  . RDW 07/07/2013 16.1* 12.3 - 15.4 % Final  . Platelets 07/07/2013 211  150 - 379 x10E3/uL Final  . Glucose 07/07/2013 88  65 - 99 mg/dL Final  . BUN 07/07/2013 36* 8 - 27 mg/dL Final  . Creatinine, Ser 07/07/2013 1.92* 0.76 - 1.27 mg/dL Final  . GFR calc non Af Amer 07/07/2013 32* >59 mL/min/1.73 Final  . GFR calc Af Amer 07/07/2013 37* >59 mL/min/1.73 Final  . BUN/Creatinine Ratio 07/07/2013 19  10 - 22 Final  . Sodium 07/07/2013 136  134 - 144 mmol/L Final  . Potassium 07/07/2013 4.7  3.5 - 5.2 mmol/L Final  . Chloride 07/07/2013 96* 97 - 108 mmol/L Final  . CO2 07/07/2013 25  18 - 29 mmol/L Final  . Calcium 07/07/2013 9.5  8.6 - 10.2 mg/dL Final  . Total Protein 07/07/2013 7.2  6.0 - 8.5 g/dL Final  . Albumin 07/07/2013 4.0  3.5 - 4.7 g/dL Final  . Globulin, Total 07/07/2013 3.2  1.5 - 4.5 g/dL Final  . Albumin/Globulin Ratio 07/07/2013 1.3  1.1 - 2.5 Final  . Total Bilirubin 07/07/2013 0.2  0.0 - 1.2 mg/dL Final  . Alkaline Phosphatase 07/07/2013 83  39 - 117 IU/L Final  . AST 07/07/2013 28  0 - 40 IU/L Final  . ALT 07/07/2013 27  0 - 44 IU/L Final  . Uric Acid 07/07/2013 6.7  3.7 - 8.6 mg/dL Final              Therapeutic target for gout patients: <6.0  Appointment on 06/16/2013  Component Date Value Range Status  . Urine Culture, Routine 06/16/2013 Final report*  Final  . Result 1 06/16/2013 Comment*  Final   Comment: Escherichia coli, identified by an automated biochemical system.                          Greater than 100,000 colony forming units  per mL  . ANTIMICROBIAL SUSCEPTIBILITY 06/16/2013 Comment   Final   Comment:   S = Susceptible; I = Intermediate; R = Resistant                                              P = Positive; N = Negative  MICS are expressed in micrograms per mL                             Antibiotic                 RSLT#1    RSLT#2    RSLT#3    RSLT#4                          Amoxicillin/Clavulanic Acid    I                          Ampicillin                     R                          Cefazolin                      R                          Cefepime                       R                          Ceftriaxone                    R                          Cefuroxime                     R                          Cephalothin                    R                          Ciprofloxacin                  R                          Ertapenem                      S                          Gentamicin                     S                          Imipenem                       S                          Levofloxacin  R                          Nitrofurantoin                 S                          Piperacillin                   R                          Tetracycline                   R                          Tobramycin                     S                          Trimethoprim/Sulfa             S  . Specific Gravity, UA 06/16/2013 1.012  1.005 - 1.030 Final  . pH, UA 06/16/2013 6.0  5.0 - 7.5 Final  . Color, UA 06/16/2013 Yellow  Yellow Final  . Appearance Ur 06/16/2013 Turbid* Clear Final  . Leukocytes, UA 06/16/2013 3+* Negative Final  . Protein, UA 06/16/2013 1+* Negative/Trace Final  . Glucose, UA 06/16/2013 Negative  Negative Final  . Ketones, UA 06/16/2013 Negative  Negative Final  . RBC, UA 06/16/2013 2+* Negative Final  . Bilirubin, UA 06/16/2013 Negative  Negative Final  . Urobilinogen, Ur 06/16/2013 0.2  0.0 - 1.9 mg/dL Final  .  Nitrite, UA 06/16/2013 Positive* Negative Final  Office Visit on 06/15/2013  Component Date Value Range Status  . Glucose 06/15/2013 87  65 - 99 mg/dL Final  . BUN 06/15/2013 46* 8 - 27 mg/dL Final  . Creatinine, Ser 06/15/2013 1.80* 0.76 - 1.27 mg/dL Final  . GFR calc non Af Amer 06/15/2013 35* >59 mL/min/1.73 Final  . GFR calc Af Amer 06/15/2013 40* >59 mL/min/1.73 Final  . BUN/Creatinine Ratio 06/15/2013 26* 10 - 22 Final  . Sodium 06/15/2013 134  134 - 144 mmol/L Final  . Potassium 06/15/2013 5.0  3.5 - 5.2 mmol/L Final  . Chloride 06/15/2013 92* 97 - 108 mmol/L Final  . CO2 06/15/2013 23  18 - 29 mmol/L Final  . Calcium 06/15/2013 9.5  8.6 - 10.2 mg/dL Final  . Total Protein 06/15/2013 6.5  6.0 - 8.5 g/dL Final  . Albumin 06/15/2013 3.4* 3.5 - 4.7 g/dL Final  . Globulin, Total 06/15/2013 3.1  1.5 - 4.5 g/dL Final  . Albumin/Globulin Ratio 06/15/2013 1.1  1.1 - 2.5 Final  . Total Bilirubin 06/15/2013 0.3  0.0 - 1.2 mg/dL Final  . Alkaline Phosphatase 06/15/2013 85  39 - 117 IU/L Final  . AST 06/15/2013 36  0 - 40 IU/L Final  . ALT 06/15/2013 36  0 - 44 IU/L Final  . WBC 06/15/2013 11.5* 3.4 - 10.8 x10E3/uL Final  . RBC 06/15/2013 3.35* 4.14 - 5.80 x10E6/uL Final  . Hemoglobin 06/15/2013 10.2* 12.6 - 17.7 g/dL Final  . HCT 06/15/2013 30.3* 37.5 - 51.0 % Final  . MCV 06/15/2013 90  79 -  97 fL Final  . MCH 06/15/2013 30.4  26.6 - 33.0 pg Final  . MCHC 06/15/2013 33.7  31.5 - 35.7 g/dL Final  . RDW 06/15/2013 14.7  12.3 - 15.4 % Final  . Platelets 06/15/2013 310  150 - 379 x10E3/uL Final  . Neutrophils Relative % 06/15/2013 64   Final  . Lymphs 06/15/2013 22   Final  . Monocytes 06/15/2013 7   Final  . Eos 06/15/2013 5   Final  . Basos 06/15/2013 1   Final  . Neutrophils Absolute 06/15/2013 7.4* 1.4 - 7.0 x10E3/uL Final  . Lymphocytes Absolute 06/15/2013 2.6  0.7 - 3.1 x10E3/uL Final  . Monocytes Absolute 06/15/2013 0.8  0.1 - 0.9 x10E3/uL Final  . Eosinophils Absolute  06/15/2013 0.6* 0.0 - 0.4 x10E3/uL Final  . Basophils Absolute 06/15/2013 0.1  0.0 - 0.2 x10E3/uL Final  . Immature Granulocytes 06/15/2013 1   Final  . Immature Grans (Abs) 06/15/2013 0.2* 0.0 - 0.1 x10E3/uL Final   Comment: (An elevated percentage of Immature Granulocytes has not been found                          to be clinically significant as a sole clinical predictor of disease.                          Does NOT include bands or blast cells.  Pregnancy associated                          physiological leukocytosis may also show increased immature                          granulocytes without clinical significance.)  Admission on 05/04/2013, Discharged on 05/09/2013  Component Date Value Range Status  . WBC 05/04/2013 8.3  4.0 - 10.5 K/uL Final  . RBC 05/04/2013 3.91* 4.22 - 5.81 MIL/uL Final  . Hemoglobin 05/04/2013 13.3  13.0 - 17.0 g/dL Final  . HCT 05/04/2013 38.9* 39.0 - 52.0 % Final  . MCV 05/04/2013 99.5  78.0 - 100.0 fL Final  . MCH 05/04/2013 34.0  26.0 - 34.0 pg Final  . MCHC 05/04/2013 34.2  30.0 - 36.0 g/dL Final  . RDW 05/04/2013 15.3  11.5 - 15.5 % Final  . Platelets 05/04/2013 133* 150 - 400 K/uL Final  . Neutrophils Relative % 05/04/2013 71  43 - 77 % Final  . Neutro Abs 05/04/2013 5.9  1.7 - 7.7 K/uL Final  . Lymphocytes Relative 05/04/2013 20  12 - 46 % Final  . Lymphs Abs 05/04/2013 1.7  0.7 - 4.0 K/uL Final  . Monocytes Relative 05/04/2013 5  3 - 12 % Final  . Monocytes Absolute 05/04/2013 0.4  0.1 - 1.0 K/uL Final  . Eosinophils Relative 05/04/2013 2  0 - 5 % Final  . Eosinophils Absolute 05/04/2013 0.2  0.0 - 0.7 K/uL Final  . Basophils Relative 05/04/2013 1  0 - 1 % Final  . Basophils Absolute 05/04/2013 0.1  0.0 - 0.1 K/uL Final  . Sodium 05/04/2013 137  135 - 145 mEq/L Final  . Potassium 05/04/2013 4.6  3.5 - 5.1 mEq/L Final  . Chloride 05/04/2013 102  96 - 112 mEq/L Final  . CO2 05/04/2013 25  19 - 32 mEq/L Final  . Glucose, Bld 05/04/2013 108* 70 -  99 mg/dL Final  .  BUN 05/04/2013 36* 6 - 23 mg/dL Final  . Creatinine, Ser 05/04/2013 1.59* 0.50 - 1.35 mg/dL Final  . Calcium 05/04/2013 9.7  8.4 - 10.5 mg/dL Final  . Total Protein 05/04/2013 6.9  6.0 - 8.3 g/dL Final  . Albumin 05/04/2013 3.5  3.5 - 5.2 g/dL Final  . AST 05/04/2013 24  0 - 37 U/L Final  . ALT 05/04/2013 23  0 - 53 U/L Final  . Alkaline Phosphatase 05/04/2013 70  39 - 117 U/L Final  . Total Bilirubin 05/04/2013 0.3  0.3 - 1.2 mg/dL Final  . GFR calc non Af Amer 05/04/2013 39* >90 mL/min Final  . GFR calc Af Amer 05/04/2013 45* >90 mL/min Final   Comment:                                 The eGFR has been calculated                          using the CKD EPI equation.                          This calculation has not been                          validated in all clinical                          situations.                          eGFR's persistently                          <90 mL/min signify                          possible Chronic Kidney Disease.  Marland Kitchen Prothrombin Time 05/04/2013 12.8  11.6 - 15.2 seconds Final  . INR 05/04/2013 0.98  0.00 - 1.49 Final  . Troponin I 05/04/2013 <0.30  <0.30 ng/mL Final   Comment:                                 Due to the release kinetics of cTnI,                          a negative result within the first hours                          of the onset of symptoms does not rule out                          myocardial infarction with certainty.                          If myocardial infarction is still suspected,                          repeat the test at appropriate intervals.  Marland Kitchen aPTT 05/04/2013 25  24 -  37 seconds Final  . Sodium 05/05/2013 139  135 - 145 mEq/L Final  . Potassium 05/05/2013 5.0  3.5 - 5.1 mEq/L Final  . Chloride 05/05/2013 106  96 - 112 mEq/L Final  . CO2 05/05/2013 25  19 - 32 mEq/L Final  . Glucose, Bld 05/05/2013 107* 70 - 99 mg/dL Final  . BUN 05/05/2013 32* 6 - 23 mg/dL Final  . Creatinine, Ser 05/05/2013  1.57* 0.50 - 1.35 mg/dL Final  . Calcium 05/05/2013 9.4  8.4 - 10.5 mg/dL Final  . GFR calc non Af Amer 05/05/2013 40* >90 mL/min Final  . GFR calc Af Amer 05/05/2013 46* >90 mL/min Final   Comment:                                 The eGFR has been calculated                          using the CKD EPI equation.                          This calculation has not been                          validated in all clinical                          situations.                          eGFR's persistently                          <90 mL/min signify                          possible Chronic Kidney Disease.  . WBC 05/05/2013 6.4  4.0 - 10.5 K/uL Final  . RBC 05/05/2013 3.73* 4.22 - 5.81 MIL/uL Final  . Hemoglobin 05/05/2013 12.2* 13.0 - 17.0 g/dL Final  . HCT 05/05/2013 37.4* 39.0 - 52.0 % Final  . MCV 05/05/2013 100.3* 78.0 - 100.0 fL Final  . MCH 05/05/2013 32.7  26.0 - 34.0 pg Final  . MCHC 05/05/2013 32.6  30.0 - 36.0 g/dL Final  . RDW 05/05/2013 15.2  11.5 - 15.5 % Final  . Platelets 05/05/2013 133* 150 - 400 K/uL Final  . Prothrombin Time 05/04/2013 12.9  11.6 - 15.2 seconds Final  . INR 05/04/2013 0.99  0.00 - 1.49 Final  . ABO/RH(D) 05/04/2013 A POS   Final  . Antibody Screen 05/04/2013 NEG   Final  . Sample Expiration 05/04/2013 05/07/2013   Final  . Unit Number 05/04/2013 [id number]   Final  . Blood Component Type 05/04/2013 RED CELLS,LR   Final  . Unit division 05/04/2013 00   Final  . Status of Unit 05/04/2013 REL FROM [facility]   Final  . Transfusion Status 05/04/2013 OK TO TRANSFUSE   Final  . Crossmatch Result 05/04/2013 Compatible   Final  . Unit Number 05/04/2013 [id number]   Final  . Blood Component Type 05/04/2013 RED CELLS,LR   Final  . Unit division 05/04/2013 00   Final  . Status of Unit 05/04/2013 REL FROM [facility]   Final  .  Transfusion Status 05/04/2013 OK TO TRANSFUSE   Final  . Crossmatch Result 05/04/2013 Compatible   Final  . MRSA, PCR 05/04/2013 NEGATIVE   NEGATIVE Final  . Staphylococcus aureus 05/04/2013 NEGATIVE  NEGATIVE Final   Comment:                                 The Xpert SA Assay (FDA                          approved for NASAL specimens                          in patients over 85 years of age),                          is one component of                          a comprehensive surveillance                          program.  Test performance has                          been validated by [employer] for patients greater                          than or equal to 19 year old.                          It is not intended                          to diagnose infection nor to                          guide or monitor treatment.  . ABO/RH(D) 05/04/2013 A POS   Final  . Order Confirmation 05/05/2013 ORDER PROCESSED BY BLOOD BANK   Final  . WBC 05/06/2013 7.9  4.0 - 10.5 K/uL Final  . RBC 05/06/2013 3.63* 4.22 - 5.81 MIL/uL Final  . Hemoglobin 05/06/2013 12.1* 13.0 - 17.0 g/dL Final  . HCT 05/06/2013 36.3* 39.0 - 52.0 % Final  . MCV 05/06/2013 100.0  78.0 - 100.0 fL Final  . MCH 05/06/2013 33.3  26.0 - 34.0 pg Final  . MCHC 05/06/2013 33.3  30.0 - 36.0 g/dL Final  . RDW 05/06/2013 15.3  11.5 - 15.5 % Final  . Platelets 05/06/2013 112* 150 - 400 K/uL Final   Comment: PLATELET COUNT CONFIRMED BY SMEAR                          SPECIMEN CHECKED FOR CLOTS  . Sodium 05/06/2013 135  135 - 145 mEq/L Final  . Potassium 05/06/2013 4.7  3.5 - 5.1 mEq/L Final  . Chloride 05/06/2013 102  96 - 112 mEq/L Final  . CO2 05/06/2013 26  19 - 32 mEq/L  Final  . Glucose, Bld 05/06/2013 128* 70 - 99 mg/dL Final  . BUN 05/06/2013 27* 6 - 23 mg/dL Final  . Creatinine, Ser 05/06/2013 1.82* 0.50 - 1.35 mg/dL Final  . Calcium 05/06/2013 8.9  8.4 - 10.5 mg/dL Final  . GFR calc non Af Amer 05/06/2013 33* >90 mL/min Final  . GFR calc Af Amer 05/06/2013 38* >90 mL/min Final   Comment:                                 The eGFR has  been calculated                          using the CKD EPI equation.                          This calculation has not been                          validated in all clinical                          situations.                          eGFR's persistently                          <90 mL/min signify                          possible Chronic Kidney Disease.  . Sodium 05/07/2013 137  135 - 145 mEq/L Final  . Potassium 05/07/2013 4.8  3.5 - 5.1 mEq/L Final  . Chloride 05/07/2013 102  96 - 112 mEq/L Final  . CO2 05/07/2013 24  19 - 32 mEq/L Final  . Glucose, Bld 05/07/2013 119* 70 - 99 mg/dL Final  . BUN 05/07/2013 31* 6 - 23 mg/dL Final  . Creatinine, Ser 05/07/2013 2.00* 0.50 - 1.35 mg/dL Final  . Calcium 05/07/2013 8.9  8.4 - 10.5 mg/dL Final  . GFR calc non Af Amer 05/07/2013 30* >90 mL/min Final  . GFR calc Af Amer 05/07/2013 34* >90 mL/min Final   Comment:                                 The eGFR has been calculated                          using the CKD EPI equation.                          This calculation has not been                          validated in all clinical                          situations.                          eGFR's persistently                          <  90 mL/min signify                          possible Chronic Kidney Disease.  . Sodium 05/08/2013 135  135 - 145 mEq/L Final  . Potassium 05/08/2013 4.8  3.5 - 5.1 mEq/L Final  . Chloride 05/08/2013 104  96 - 112 mEq/L Final  . CO2 05/08/2013 22  19 - 32 mEq/L Final  . Glucose, Bld 05/08/2013 116* 70 - 99 mg/dL Final  . BUN 05/08/2013 30* 6 - 23 mg/dL Final  . Creatinine, Ser 05/08/2013 1.70* 0.50 - 1.35 mg/dL Final  . Calcium 05/08/2013 8.6  8.4 - 10.5 mg/dL Final  . GFR calc non Af Amer 05/08/2013 36* >90 mL/min Final  . GFR calc Af Amer 05/08/2013 42* >90 mL/min Final   Comment:                                 The eGFR has been calculated                          using the CKD EPI equation.                           This calculation has not been                          validated in all clinical                          situations.                          eGFR's persistently                          <90 mL/min signify                          possible Chronic Kidney Disease.  . Sodium 05/09/2013 137  135 - 145 mEq/L Final  . Potassium 05/09/2013 4.7  3.5 - 5.1 mEq/L Final  . Chloride 05/09/2013 104  96 - 112 mEq/L Final  . CO2 05/09/2013 23  19 - 32 mEq/L Final  . Glucose, Bld 05/09/2013 115* 70 - 99 mg/dL Final  . BUN 05/09/2013 32* 6 - 23 mg/dL Final  . Creatinine, Ser 05/09/2013 1.38* 0.50 - 1.35 mg/dL Final  . Calcium 05/09/2013 9.1  8.4 - 10.5 mg/dL Final  . GFR calc non Af Amer 05/09/2013 46* >90 mL/min Final  . GFR calc Af Amer 05/09/2013 54* >90 mL/min Final   Comment:                                 The eGFR has been calculated                          using the CKD EPI equation.                          This calculation has not been  validated in all clinical                          situations.                          eGFR's persistently                          <90 mL/min signify                          possible Chronic Kidney Disease.  Orders Only on 04/21/2013  Component Date Value Range Status  . Glucose 04/21/2013 83  65 - 99 mg/dL Final  . BUN 04/21/2013 37* 8 - 27 mg/dL Final  . Creatinine, Ser 04/21/2013 1.64* 0.76 - 1.27 mg/dL Final  . GFR calc non Af Amer 04/21/2013 39* >59 mL/min/1.73 Final  . GFR calc Af Amer 04/21/2013 45* >59 mL/min/1.73 Final  . BUN/Creatinine Ratio 04/21/2013 23* 10 - 22 Final  . Sodium 04/21/2013 137  134 - 144 mmol/L Final  . Potassium 04/21/2013 5.4* 3.5 - 5.2 mmol/L Final  . Chloride 04/21/2013 103  97 - 108 mmol/L Final  . CO2 04/21/2013 20  18 - 29 mmol/L Final  . Calcium 04/21/2013 9.5  8.6 - 10.2 mg/dL Final  . Cholesterol, Total 04/21/2013 229* 100 - 199 mg/dL Final  . Triglycerides 04/21/2013  96  0 - 149 mg/dL Final  . HDL 04/21/2013 54  >39 mg/dL Final   Comment: According to ATP-III Guidelines, HDL-C >59 mg/dL is considered a                          negative risk factor for CHD.  Marland Kitchen VLDL Cholesterol Cal 04/21/2013 19  5 - 40 mg/dL Final  . LDL Calculated 04/21/2013 156* 0 - 99 mg/dL Final  . LDl/HDL Ratio 04/21/2013 2.9  0.0 - 3.6 ratio units Final  . PSA 04/21/2013 10.2* 0.0 - 4.0 ng/mL Final   Comment: Roche ECLIA methodology.                          According to the American Urological Association, Serum PSA should                          decrease and remain at undetectable levels after radical                          prostatectomy. The AUA defines biochemical recurrence as an initial                          PSA value 0.2 ng/mL or greater followed by a subsequent confirmatory                          PSA value 0.2 ng/mL or greater.                          Values obtained with different assay methods or kits cannot be used  interchangeably. Results cannot be interpreted as absolute evidence                          of the presence or absence of malignant disease.  Marland Kitchen Uric Acid 04/21/2013 6.7  3.7 - 8.6 mg/dL Final              Therapeutic target for gout patients: <6.0       Assessment & Plan:  Urinary retention: will continue to see urologist. Hopefully, the rapaflo will shrink the BPH and he will be able to stop the [facility] catheter in the future.  Anemia - Plan: CBC (no diff)  Essential and other specified forms of tremor - Plan: primidone (MYSOLINE) 50 MG tablet  Closed left femoral fracture, with routine healing, subsequent encounter: recovering satisfactorily  BPH (benign prostatic hyperplasia) - Plan: silodosin (RAPAFLO) 8 MG CAPS capsule  CKD (chronic kidney disease), stage III - Plan: CMP  Abnormality of gait: advised caution and reminded of the risk for falling  Hyperlipidemia: controlled  Polyneuropathy in other diseases  classified elsewhere:unchanged  Hypertension: controlled  Gout - Plan: Uric Acid  Spinal stenosis, lumbar region, with neurogenic claudication: observe  Need for prophylactic vaccination and inoculation against influenza: administered

## 2013-07-07 NOTE — Patient Instructions (Signed)
Continue current medication.

## 2013-07-08 LAB — CBC
MCH: 31.4 pg (ref 26.6–33.0)
MCV: 95 fL (ref 79–97)
Platelets: 211 10*3/uL (ref 150–379)
RBC: 3.69 x10E6/uL — ABNORMAL LOW (ref 4.14–5.80)
RDW: 16.1 % — ABNORMAL HIGH (ref 12.3–15.4)

## 2013-07-08 LAB — COMPREHENSIVE METABOLIC PANEL
ALT: 27 IU/L (ref 0–44)
AST: 28 IU/L (ref 0–40)
Albumin/Globulin Ratio: 1.3 (ref 1.1–2.5)
CO2: 25 mmol/L (ref 18–29)
Calcium: 9.5 mg/dL (ref 8.6–10.2)
Chloride: 96 mmol/L — ABNORMAL LOW (ref 97–108)
GFR calc non Af Amer: 32 mL/min/{1.73_m2} — ABNORMAL LOW (ref >59)
Glucose: 88 mg/dL (ref 65–99)
Potassium: 4.7 mmol/L (ref 3.5–5.2)
Sodium: 136 mmol/L (ref 134–144)
Total Protein: 7.2 g/dL (ref 6.0–8.5)

## 2013-07-08 LAB — URIC ACID: Uric Acid: 6.7 mg/dL (ref 3.7–8.6)

## 2013-07-24 ENCOUNTER — Other Ambulatory Visit: Payer: Self-pay | Admitting: *Deleted

## 2013-07-27 ENCOUNTER — Telehealth: Payer: Self-pay | Admitting: *Deleted

## 2013-07-27 NOTE — Telephone Encounter (Signed)
Called patient, left message on machine stating that the triple antibiotic ointment was fine to use per Dr. Nyoka Cowden. Informed them if they needed further assistance to feel free to call the office.

## 2013-07-27 NOTE — Telephone Encounter (Signed)
Message copied by Eilene Ghazi on Mon Jul 27, 2013  3:36 PM ------      Message from: [name]      Created: Mon Jul 27, 2013 12:16 PM       Triple antibiotic cream is OK.      ----- Message -----         From: Eilene Ghazi, RMA         Sent: 07/24/2013   1:49 PM           To: [name], MD            Patient has a sore area on his RT shin, using triple antibiotic ointment now and wants to know if they should be using something else.       ------

## 2013-08-24 ENCOUNTER — Telehealth: Payer: Self-pay

## 2013-08-24 NOTE — Telephone Encounter (Signed)
Patient requested a answer to question concerning if it was ok to withhold plavix for a procedure (TURP). Faxed answer to question to number given by patient.

## 2013-08-24 NOTE — Telephone Encounter (Signed)
Pt may hold plavix for 7 days before the procedure and should resume the following day.  Please inform patient that anytime anticoagulation is held, there is increased risk for recurrent stroke.

## 2013-08-24 NOTE — Telephone Encounter (Signed)
Patient in need of a procedure: TURP, ? Ok to hold plavix x 7 days, appointment not schedule yet, will need the ok to hold plavix first.  FAX phone note to [phone] with response

## 2013-08-25 ENCOUNTER — Other Ambulatory Visit: Payer: Self-pay | Admitting: Urology

## 2013-08-25 ENCOUNTER — Encounter (HOSPITAL_COMMUNITY): Payer: Self-pay | Admitting: Pharmacy Technician

## 2013-08-25 NOTE — Progress Notes (Signed)
Need orders in EPIC.  Surgery scheduled for 09/07/13.  Thank You.

## 2013-08-31 ENCOUNTER — Encounter (HOSPITAL_COMMUNITY): Payer: Self-pay | Admitting: Pharmacy Technician

## 2013-09-01 ENCOUNTER — Encounter (HOSPITAL_COMMUNITY)
Admission: RE | Admit: 2013-09-01 | Discharge: 2013-09-01 | Disposition: A | Discharge status: Home / Self Care | Payer: Medicare Other | Source: Ambulatory Visit | Attending: Urology | Admitting: Urology

## 2013-09-01 ENCOUNTER — Encounter (HOSPITAL_COMMUNITY): Payer: Self-pay

## 2013-09-01 DIAGNOSIS — N138 Other obstructive and reflux uropathy: Secondary | ICD-10-CM | POA: Insufficient documentation

## 2013-09-01 DIAGNOSIS — Z978 Presence of other specified devices: Secondary | ICD-10-CM

## 2013-09-01 DIAGNOSIS — N401 Enlarged prostate with lower urinary tract symptoms: Secondary | ICD-10-CM | POA: Insufficient documentation

## 2013-09-01 DIAGNOSIS — Z01812 Encounter for preprocedural laboratory examination: Secondary | ICD-10-CM | POA: Insufficient documentation

## 2013-09-01 DIAGNOSIS — N390 Urinary tract infection, site not specified: Secondary | ICD-10-CM

## 2013-09-01 DIAGNOSIS — R339 Retention of urine, unspecified: Secondary | ICD-10-CM | POA: Insufficient documentation

## 2013-09-01 HISTORY — DX: Urinary tract infection, site not specified: N39.0

## 2013-09-01 HISTORY — DX: Presence of urogenital implants: Z96.0

## 2013-09-01 HISTORY — DX: Presence of other specified devices: Z97.8

## 2013-09-01 HISTORY — DX: Adverse effect of unspecified anesthetic, initial encounter: T41.45XA

## 2013-09-01 HISTORY — DX: Other complications of anesthesia, initial encounter: T88.59XA

## 2013-09-01 LAB — BASIC METABOLIC PANEL
BUN: 28 mg/dL — ABNORMAL HIGH (ref 6–23)
Calcium: 9.4 mg/dL (ref 8.4–10.5)
Chloride: 101 mEq/L (ref 96–112)
GFR calc Af Amer: 54 mL/min — ABNORMAL LOW (ref >90)
GFR calc non Af Amer: 47 mL/min — ABNORMAL LOW (ref >90)
Potassium: 4.5 mEq/L (ref 3.5–5.1)
Sodium: 137 mEq/L (ref 135–145)

## 2013-09-01 LAB — CBC
MCHC: 32 g/dL (ref 30.0–36.0)
Platelets: 202 10*3/uL (ref 150–400)
RDW: 15.5 % (ref 11.5–15.5)
WBC: 8 10*3/uL (ref 4.0–10.5)

## 2013-09-01 NOTE — Patient Instructions (Addendum)
[place]  09/01/2013   Your procedure is scheduled on:  12-8 -2014  Report to [facility] at     [place]   AM .  Call this number if you have problems the morning of surgery: [phone]  Or Presurgical Testing [phone](Mattisen Pohlmann)      Do not eat food:After Midnight.    Take these medicines the morning of surgery with A SIP OF WATER: none.  Do not wear jewelry, make-up or nail polish.  Do not wear lotions, powders, or perfumes. You may wear deodorant.  Do not shave 12 hours prior to first CHG shower(legs and under arms).(face and neck okay.)  Do not bring valuables to the hospital.  Contacts, dentures or removable bridgework, body piercing, hair pins may not be worn into surgery.  Leave suitcase in the car. After surgery it may be brought to your room.  For patients admitted to the hospital, checkout time is 11:00 AM the day of discharge.   Patients discharged the day of surgery will not be allowed to drive home. Must have responsible person with you x 24 hours once discharged.  Name and phone number of your driver: sohil, elizardo [phone] cell  Special Instructions: CHG(Chlorhedine 4%-"Hibiclens","Betasept","Aplicare") Shower Use Special Wash: see special instructions.(avoid face and genitals)     Remember : Type/Screen "Blue armbands" - may not be removed once applied(would result in being retested if removed).  Failure to follow these instructions may result in Cancellation of your surgery.   Patient signature_______________________________________________________

## 2013-09-01 NOTE — Progress Notes (Signed)
09-01-13 1610 labs viewable in Epic-please note. W. Floy Sabina

## 2013-09-01 NOTE — Pre-Procedure Instructions (Addendum)
09-01-13  EKG/ CXR 1view 8'14, ECHO 7'14-Epic. 09-01-13 1610 -Note faxed to Dr. Mikle Bosworth office-labs viewable in [place].

## 2013-09-06 MED ORDER — GENTAMICIN SULFATE 40 MG/ML IJ SOLN
340.0000 mg | INTRAVENOUS | Status: AC
  Administered 2013-09-07: 340 mg via INTRAVENOUS
  Filled 2013-09-06: qty 8.5

## 2013-09-07 ENCOUNTER — Encounter (HOSPITAL_COMMUNITY): Payer: Self-pay | Admitting: *Deleted

## 2013-09-07 ENCOUNTER — Ambulatory Visit (HOSPITAL_COMMUNITY): Payer: Medicare Other | Admitting: Anesthesiology

## 2013-09-07 ENCOUNTER — Observation Stay (HOSPITAL_COMMUNITY)
Admission: RE | Admit: 2013-09-07 | Discharge: 2013-09-08 | Disposition: A | Discharge status: Home / Self Care | Payer: Medicare Other | Source: Ambulatory Visit | Attending: Urology | Admitting: Urology

## 2013-09-07 ENCOUNTER — Encounter (HOSPITAL_COMMUNITY): Payer: Medicare Other | Admitting: Anesthesiology

## 2013-09-07 ENCOUNTER — Encounter (HOSPITAL_COMMUNITY)
Admission: RE | Disposition: A | Discharge status: Home / Self Care | Payer: Self-pay | Source: Ambulatory Visit | Attending: Urology

## 2013-09-07 DIAGNOSIS — E7800 Pure hypercholesterolemia, unspecified: Secondary | ICD-10-CM | POA: Insufficient documentation

## 2013-09-07 DIAGNOSIS — Z79899 Other long term (current) drug therapy: Secondary | ICD-10-CM | POA: Insufficient documentation

## 2013-09-07 DIAGNOSIS — N401 Enlarged prostate with lower urinary tract symptoms: Secondary | ICD-10-CM | POA: Insufficient documentation

## 2013-09-07 DIAGNOSIS — G609 Hereditary and idiopathic neuropathy, unspecified: Secondary | ICD-10-CM | POA: Insufficient documentation

## 2013-09-07 DIAGNOSIS — N138 Other obstructive and reflux uropathy: Principal | ICD-10-CM | POA: Insufficient documentation

## 2013-09-07 DIAGNOSIS — I129 Hypertensive chronic kidney disease with stage 1 through stage 4 chronic kidney disease, or unspecified chronic kidney disease: Secondary | ICD-10-CM | POA: Insufficient documentation

## 2013-09-07 DIAGNOSIS — Z87891 Personal history of nicotine dependence: Secondary | ICD-10-CM | POA: Insufficient documentation

## 2013-09-07 DIAGNOSIS — N183 Chronic kidney disease, stage 3 unspecified: Secondary | ICD-10-CM | POA: Insufficient documentation

## 2013-09-07 DIAGNOSIS — R339 Retention of urine, unspecified: Secondary | ICD-10-CM | POA: Diagnosis present

## 2013-09-07 DIAGNOSIS — Z8673 Personal history of transient ischemic attack (TIA), and cerebral infarction without residual deficits: Secondary | ICD-10-CM | POA: Insufficient documentation

## 2013-09-07 DIAGNOSIS — I4891 Unspecified atrial fibrillation: Secondary | ICD-10-CM | POA: Insufficient documentation

## 2013-09-07 DIAGNOSIS — N390 Urinary tract infection, site not specified: Secondary | ICD-10-CM | POA: Insufficient documentation

## 2013-09-07 DIAGNOSIS — K219 Gastro-esophageal reflux disease without esophagitis: Secondary | ICD-10-CM | POA: Insufficient documentation

## 2013-09-07 HISTORY — PX: TRANSURETHRAL RESECTION OF PROSTATE: SHX73

## 2013-09-07 SURGERY — TURP (TRANSURETHRAL RESECTION OF PROSTATE)
Anesthesia: General | Site: Prostate

## 2013-09-07 MED ORDER — BELLADONNA ALKALOIDS-OPIUM 16.2-60 MG RE SUPP
1.0000 | Freq: Four times a day (QID) | RECTAL | Status: DC | PRN
Start: 2013-09-07 — End: 2013-09-08

## 2013-09-07 MED ORDER — EPHEDRINE SULFATE 50 MG/ML IJ SOLN
INTRAMUSCULAR | Status: AC
  Filled 2013-09-07: qty 1

## 2013-09-07 MED ORDER — PROPOFOL 10 MG/ML IV BOLUS
INTRAVENOUS | Status: AC
  Filled 2013-09-07: qty 20

## 2013-09-07 MED ORDER — FENTANYL CITRATE 0.05 MG/ML IJ SOLN
INTRAMUSCULAR | Status: AC
  Filled 2013-09-07: qty 2

## 2013-09-07 MED ORDER — ACETAMINOPHEN 325 MG PO TABS
650.0000 mg | ORAL_TABLET | ORAL | Status: DC | PRN
  Administered 2013-09-07 (×2): 650 mg via ORAL
  Filled 2013-09-07 (×2): qty 2

## 2013-09-07 MED ORDER — GLYCOPYRROLATE 0.2 MG/ML IJ SOLN
INTRAMUSCULAR | Status: AC
  Filled 2013-09-07: qty 3

## 2013-09-07 MED ORDER — ONDANSETRON HCL 4 MG/2ML IJ SOLN
INTRAMUSCULAR | Status: AC
  Filled 2013-09-07: qty 2

## 2013-09-07 MED ORDER — GLYCOPYRROLATE 0.2 MG/ML IJ SOLN
INTRAMUSCULAR | Status: DC | PRN
  Administered 2013-09-07: 0.6 mg via INTRAVENOUS

## 2013-09-07 MED ORDER — LACTATED RINGERS IV SOLN
INTRAVENOUS | Status: DC | PRN
  Administered 2013-09-07: 07:00:00 via INTRAVENOUS

## 2013-09-07 MED ORDER — LIDOCAINE HCL (CARDIAC) 20 MG/ML IV SOLN
INTRAVENOUS | Status: AC
  Filled 2013-09-07: qty 5

## 2013-09-07 MED ORDER — PROMETHAZINE HCL 25 MG/ML IJ SOLN: 6.2500 mg | INTRAMUSCULAR | Status: DC | PRN

## 2013-09-07 MED ORDER — NEOSTIGMINE METHYLSULFATE 1 MG/ML IJ SOLN
INTRAMUSCULAR | Status: DC | PRN
  Administered 2013-09-07: 4 mg via INTRAVENOUS

## 2013-09-07 MED ORDER — ONDANSETRON HCL 4 MG/2ML IJ SOLN: 4.0000 mg | INTRAMUSCULAR | Status: DC | PRN

## 2013-09-07 MED ORDER — ONDANSETRON HCL 4 MG/2ML IJ SOLN
INTRAMUSCULAR | Status: DC | PRN
  Administered 2013-09-07: 4 mg via INTRAVENOUS

## 2013-09-07 MED ORDER — FENTANYL CITRATE 0.05 MG/ML IJ SOLN
INTRAMUSCULAR | Status: DC | PRN
  Administered 2013-09-07: 100 ug via INTRAVENOUS
  Administered 2013-09-07: 25 ug via INTRAVENOUS
  Administered 2013-09-07: 50 ug via INTRAVENOUS
  Administered 2013-09-07: 25 ug via INTRAVENOUS
  Administered 2013-09-07: 100 ug via INTRAVENOUS

## 2013-09-07 MED ORDER — SODIUM CHLORIDE 0.9 % IJ SOLN
INTRAMUSCULAR | Status: AC
  Filled 2013-09-07: qty 10

## 2013-09-07 MED ORDER — FENTANYL CITRATE 0.05 MG/ML IJ SOLN: 25.0000 ug | INTRAMUSCULAR | Status: DC | PRN

## 2013-09-07 MED ORDER — PRIMIDONE 50 MG PO TABS
50.0000 mg | ORAL_TABLET | Freq: Every day | ORAL | Status: DC
  Administered 2013-09-07: 50 mg via ORAL
  Filled 2013-09-07 (×2): qty 1

## 2013-09-07 MED ORDER — ROCURONIUM BROMIDE 100 MG/10ML IV SOLN
INTRAVENOUS | Status: DC | PRN
  Administered 2013-09-07: 40 mg via INTRAVENOUS

## 2013-09-07 MED ORDER — PROPOFOL 10 MG/ML IV BOLUS
INTRAVENOUS | Status: DC | PRN
  Administered 2013-09-07: 150 mg via INTRAVENOUS

## 2013-09-07 MED ORDER — MORPHINE SULFATE 2 MG/ML IJ SOLN: 2.0000 mg | INTRAMUSCULAR | Status: DC | PRN

## 2013-09-07 MED ORDER — ATROPINE SULFATE 0.4 MG/ML IJ SOLN
INTRAMUSCULAR | Status: AC
  Filled 2013-09-07: qty 1

## 2013-09-07 MED ORDER — HYDROCODONE-ACETAMINOPHEN 5-325 MG PO TABS: 1.0000 | ORAL_TABLET | ORAL | Status: DC | PRN

## 2013-09-07 MED ORDER — SODIUM CHLORIDE 0.9 % IR SOLN
Status: DC | PRN
  Administered 2013-09-07: 24000 mL via INTRAVESICAL
  Administered 2013-09-07: 6000 mL via INTRAVESICAL

## 2013-09-07 MED ORDER — POTASSIUM CHLORIDE IN NACL 20-0.9 MEQ/L-% IV SOLN
INTRAVENOUS | Status: DC
  Administered 2013-09-07 – 2013-09-08 (×2): via INTRAVENOUS
  Filled 2013-09-07 (×3): qty 1000

## 2013-09-07 MED ORDER — ROCURONIUM BROMIDE 100 MG/10ML IV SOLN
INTRAVENOUS | Status: AC
  Filled 2013-09-07: qty 1

## 2013-09-07 MED ORDER — LIDOCAINE HCL (PF) 2 % IJ SOLN
INTRAMUSCULAR | Status: DC | PRN
  Administered 2013-09-07: 75 mg via INTRADERMAL

## 2013-09-07 MED ORDER — ALLOPURINOL 100 MG PO TABS
100.0000 mg | ORAL_TABLET | Freq: Two times a day (BID) | ORAL | Status: DC
  Administered 2013-09-07 – 2013-09-08 (×3): 100 mg via ORAL
  Filled 2013-09-07 (×4): qty 1

## 2013-09-07 SURGICAL SUPPLY — 25 items
BAG URINE DRAINAGE (UROLOGICAL SUPPLIES) ×2 IMPLANT
BAG URO CATCHER STRL LF (DRAPE) ×2 IMPLANT
BLADE SURG 15 STRL LF DISP TIS (BLADE) IMPLANT
BLADE SURG 15 STRL SS (BLADE)
CATH FOLEY 3WAY 30CC 24FR (CATHETERS) ×2
CATH URO 16X24FR 3W FL PS (CATHETERS) ×1 IMPLANT
ELECT BUTTON HF 24-28F 2 30DE (ELECTRODE) ×1 IMPLANT
ELECT LOOP MED HF 24F 12D CBL (CLIP) ×1 IMPLANT
ELECT REM PT RETURN 9FT ADLT (ELECTROSURGICAL) ×2
ELECTRODE REM PT RTRN 9FT ADLT (ELECTROSURGICAL) ×1 IMPLANT
EVACUATOR ELLICK (MISCELLANEOUS) ×1 IMPLANT
GLOVE BIOGEL M STRL SZ7.5 (GLOVE) ×2 IMPLANT
GOWN STRL REIN XL XLG (GOWN DISPOSABLE) ×2 IMPLANT
HOLDER FOLEY CATH W/STRAP (MISCELLANEOUS) ×2 IMPLANT
JUMPSUIT BLUE BOOT COVER DISP (PROTECTIVE WEAR) ×1 IMPLANT
KIT ASPIRATION TUBING (SET/KITS/TRAYS/PACK) ×2 IMPLANT
LOOPS RESECTOSCOPE DISP (ELECTROSURGICAL) ×1 IMPLANT
MANIFOLD NEPTUNE II (INSTRUMENTS) ×2 IMPLANT
NS IRRIG 1000ML POUR BTL (IV SOLUTION) ×2 IMPLANT
PACK CYSTO (CUSTOM PROCEDURE TRAY) ×2 IMPLANT
SUT ETHILON 3 0 PS 1 (SUTURE) IMPLANT
SYR 30ML LL (SYRINGE) ×2 IMPLANT
SYRINGE IRR TOOMEY STRL 70CC (SYRINGE) ×1 IMPLANT
TUBING CONNECTING 10 (TUBING) ×2 IMPLANT
TUBING CONNECTOR 18X5MM (MISCELLANEOUS) ×1 IMPLANT

## 2013-09-07 NOTE — Anesthesia Preprocedure Evaluation (Signed)
Anesthesia Evaluation  Patient identified by MRN, date of birth, ID band Patient awake    Reviewed: Allergy & Precautions, H&P , NPO status , Patient's Chart, lab work & pertinent test results  Airway Mallampati: II TM Distance: >3 FB Neck ROM: Full    Dental no notable dental hx.    Pulmonary neg pulmonary ROS, former smoker,  breath sounds clear to auscultation  Pulmonary exam normal       Cardiovascular hypertension, Pt. on medications Rhythm:Regular Rate:Normal     Neuro/Psych TIAnegative psych ROS   GI/Hepatic negative GI ROS, Neg liver ROS,   Endo/Other  negative endocrine ROS  Renal/GU Renal InsufficiencyRenal disease  negative genitourinary   Musculoskeletal negative musculoskeletal ROS (+)   Abdominal   Peds negative pediatric ROS (+)  Hematology negative hematology ROS (+) anemia ,   Anesthesia Other Findings   Reproductive/Obstetrics negative OB ROS                           Anesthesia Physical Anesthesia Plan  ASA: III  Anesthesia Plan: General   Post-op Pain Management:    Induction: Intravenous  Airway Management Planned: LMA  Additional Equipment:   Intra-op Plan:   Post-operative Plan:   Informed Consent: I have reviewed the patients History and Physical, chart, labs and discussed the procedure including the risks, benefits and alternatives for the proposed anesthesia with the patient or authorized representative who has indicated his/her understanding and acceptance.   Dental advisory given  Plan Discussed with: CRNA and Surgeon  Anesthesia Plan Comments:         Anesthesia Quick Evaluation

## 2013-09-07 NOTE — H&P (Signed)
History of Present Illness   [name] had chronic decreased flow, voiding every 2-3 hours and getting up once at night. He had a mini-stroke in June. He had hip surgery on August 4 and has failed a number of trials of voiding. He has peripheral neuropathy. He has renal insufficiency with a serum creatinine of 1.6. He had a left intertrochanteric femoral fracture. He had been on Flomax. He was here to discuss his urodynamics.   Review of Systems: No change in bowel or neurologic systems.   [name] arrived today with a Foley catheter. His maximum capacity is 400 mL. His bladder was stable. He had some fullness and discomfort at capacity. He had some sensory urgency. He did not leak with a Valsalva of 91 cmH2O. He generated a voluntary voiding contraction of a pressure of 34 cmH2O. He could not generate any flow. EMG activity was within normal limits. He had mild bladder trabeculation and mild right-sided reflux noted. Voiding contraction was not that well sustained. Foley catheter was reinserted.   He failed Rapaflo as well.   I went through my medical records and I deferred his rectal examination and he has never had a cystoscopy.   I did a digital rectal exam on [name]. He has a walker but he is getting around much better now since his hip surgery. He had a 40 and perhaps 50-gram benign feeling prostate.   [name] underwent cystoscopy after verbal consent. Sterile technique and flexible scope utilized. Penile, bulbar, and membranous urethra are normal. He had a 2.5 cm prostatic urethra with bilobar enlargement of the bladder neck with a few hyperemic blood vessels. He did not particularly have a high-riding bladder neck. He had bullous edema at the floor of the bladder in keeping from the Foley balloon. The rest of the bladder mucosa and trigone looked normal. He tolerated the procedure well. Foley catheter was reinserted.  Utilizing sterile technique, a 16 French Foley catheter with 5 cc  balloon was inserted into the patient's bladder.    Past Medical History Problems  1. History of Arthritis (V13.4) 2. History of Benign prostatic hypertrophy without lower urinary tract symptoms (600.00) 3. History of Chronic kidney disease, stage III (moderate) (585.3) 4. History of Duodenal Ulcer (532.90) 5. History of Gout (274.9) 6. History of Hip Fracture (820.8) 7. History of diverticulitis of colon (V12.79) 8. History of esophageal reflux (V12.79) 9. History of hypercholesterolemia (V12.29) 10. History of hypertension (V12.59) 11. History of peripheral neuropathy (V12.49) 12. History of urinary frequency (V13.09) 13. History of Inguinal hernia, unilateral (550.90) 14. History of Internal hemorrhoids (455.0) 15. History of Macular Degeneration (362.50) 16. History of Pneumonia (V12.61) 17. History of Spinal stenosis (724.00) 18. History of Stroke syndrome (436) 19. History of Urinary urgency (788.63) 20. History of Vitamin D deficiency (268.9)  Surgical History Problems  1. History of Biopsy Nerve 2. History of Complete Colonoscopy 3. History of Hernia Repair 4. History of Leg Repair  Current Meds 1. Allopurinol 100 MG Oral Tablet;  Therapy: 18Sep2013 to Recorded 2. Clopidogrel Bisulfate 75 MG Oral Tablet;  Therapy: 20Jun2014 to Recorded 3. Multi-Vitamin TABS;  Therapy: (Recorded:27Aug2014) to Recorded 4. [place] Formula Oral Capsule;  Therapy: (Recorded:27Aug2014) to Recorded 5. Super B Complex TABS;  Therapy: (Recorded:27Aug2014) to Recorded 6. Vitamin D 2000 UNIT Oral Capsule;  Therapy: (Recorded:27Aug2014) to Recorded  Allergies Medication  1. No Known Drug Allergies  Family History Problems  1. Family history of Chronic Obstructive Pulmonary Disease :  Brother 2. Family history of Death In The Family Father 3. Family history of Death In The Family Mother 4. Family history of Family Health Status Number Of Children   two sons 6. Family  history of Heart Disease (V17.49) : Father 6. Family history of Leukemia (V16.6) : Father 7. Family history of Nephrolithiasis : Brother  Social History Problems  1. Denied: History of Alcohol Use 2. Caffeine Use   3 - 4 per day 3. Former smoker (V15.82)   quit 30 years ago as of 05/27/13 office visit 4. Marital History - Currently Married 5. 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 Cysto; Status:Complete;   [id number] Urinary tract infection  2. Start: Ciprofloxacin HCl - 250 MG Oral Tablet (Cipro); Take 1 tablet twice daily 3. URINE CULTURE; Status:In Progress - Specimen/Data Collected;   Done: [id number]  Discussion/Summary   Drew Gibson is obstructed. Based upon his age and comorbidities and bladder contractility, ___ of a TURP ____ from a catheter is probably in the 70% range. He understands that his bladder is not that strong and certainly watchful waiting and long-term Foley catheter are options.   I went over a transurethral resection of the prostate in detail with him. Risk of anesthesia were discussed. Pros and cons and risks of surgery were discussed. Some of the risks also included failure rates, bleeding requiring Foley or surgery or admission. The risk of bladder neck contracture recurrence, urethral stricture and retrograde ejaculation were discussed. Risk of urinary tract infection and sepsis was discussed. Risk of a DVT post hip surgery was discussed. He would need medical clearance prior to surgery, though he is on a  limited amount of medication.   [name] will be given 2 days of ciprofloxacin samples today because of the manipulation and he may have recently had some low-grade UTI symptoms. He will also be given a prescription for 3 days of ciprofloxacin 250 mg twice a day 3 days prior to surgery. He will see Pam and may schedule surgery. I will need clearance by Dr. Nyoka Cowden, especially with his peripheral neuropathy and his frailness, before proceeding with surgery.

## 2013-09-07 NOTE — Interval H&P Note (Signed)
History and Physical Interval Note:  09/07/2013 7:12 AM  [name] Section  has presented today for surgery, with the diagnosis of RETENTION/BPH  The various methods of treatment have been discussed with the patient and family. After consideration of risks, benefits and other options for treatment, the patient has consented to  Procedure(s): CYSTO TRANSURETHRAL RESECTION OF THE PROSTATE (TURP) (N/A) as a surgical intervention .  The patient's history has been reviewed, patient examined, no change in status, stable for surgery.  I have reviewed the patient's chart and labs.  Questions were answered to the patient's satisfaction.     Tabius Rood A

## 2013-09-07 NOTE — Transfer of Care (Signed)
Immediate Anesthesia Transfer of Care Note  Patient: Drew Gibson  Procedure(s) Performed: Procedure(s): CYSTO TRANSURETHRAL RESECTION OF THE PROSTATE (TURP) (N/A)  Patient Location: PACU  Anesthesia Type:General  Level of Consciousness: awake, alert  and patient cooperative  Airway & Oxygen Therapy: Patient Spontanous Breathing and Patient connected to face mask oxygen  Post-op Assessment: Report given to PACU RN, Post -op Vital signs reviewed and stable and Patient moving all extremities X 4  Post vital signs: Reviewed and stable  Complications: No apparent anesthesia complications

## 2013-09-07 NOTE — Anesthesia Postprocedure Evaluation (Signed)
  Anesthesia Post-op Note  Patient: Drew Gibson  Procedure(s) Performed: Procedure(s) (LRB): CYSTO TRANSURETHRAL RESECTION OF THE PROSTATE (TURP) (N/A)  Patient Location: PACU  Anesthesia Type: General  Level of Consciousness: awake and alert   Airway and Oxygen Therapy: Patient Spontanous Breathing  Post-op Pain: mild  Post-op Assessment: Post-op Vital signs reviewed, Patient's Cardiovascular Status Stable, Respiratory Function Stable, Patent Airway and No signs of Nausea or vomiting  Last Vitals:  Filed Vitals:   09/07/13 1000  BP: 133/75  Pulse: 60  Temp: 36.7 C  Resp: 10    Post-op Vital Signs: stable   Complications: No apparent anesthesia complications

## 2013-09-07 NOTE — Op Note (Signed)
Preoperative diagnosis: Benign prostatic hyperplasia and urinary retention with Foley catheter Postoperative diagnosis: Benign prostatic hyperplasia and urinary retention with Foley catheter Surgery: Cystoscopy and transurethral resection of the prostate using bipolar electrode Surgeon: Dr. Nicki Reaper Trey Bebee  The patient consented the above procedure with the above diagnoses. Preoperative technique was used to sterilize urine. Preoperative antibiotics were given. Leg position was excellent with extra care on the left side because of his hip surgery 4 months ago  With a 17 [place] scope I initially cystoscoped the patient. The penile and bulbar urethra normal. Importantly he had an open external sphincter I could see a modest vera montanum. He had a shorter prostatic urethra. He did not have an elevated bladder neck. He grade 1 and a 4 bladder trabeculation. Ureters were easily seen away from the bladder neck. It a little bit of bullous edema near the top of the bladder in the midline from the Foley balloon. He did not look like carcinoma I did not biopsy it  I then under direct vision introduced the bipolar TUR resectoscope. Using my half loop technique I resected the bladder neck circumferentially and not too deep. Coagulation was used for bleeders. Using the loop I resected the left lobe always been very cognizant of the vera montanum. Throughout the resection I checked the position of the vera montanum likely 20-30 times. I then used the ball to deepen my resection on the left and to get all bleeders. I was modestly aggressive with apical tissue at 5 and 7:00 and this was where most of the obstructive tissue appeared. Having said that I did leave apical tissue and never went beyond the proximal third of the vera montanum. I was also careful not to do so at 12:00.  There was more tissue on the right side and I used the ball for the entire coagulation and/or resection. All bleeders were cauterized. Looking  back from the proximal vera montanum the prostatic urethra was wide open. Again I left some apical tissue.  With my usual technique and Ellik irrigated I insured irrigation of all the fragments. There is no bladder injury. Ureters looked normal. I cauterized any mild bleeders. 25 French catheter that was clear was easily inserted. 30 cc of sterile water was placed in the balloon. No traction was needed. Irrigation was almost completely clear  Hopefully the patient will reach his treatment goal

## 2013-09-08 ENCOUNTER — Encounter (HOSPITAL_COMMUNITY): Payer: Self-pay | Admitting: Urology

## 2013-09-08 LAB — CBC
MCH: 30.9 pg (ref 26.0–34.0)
MCV: 97.8 fL (ref 78.0–100.0)
Platelets: 148 10*3/uL — ABNORMAL LOW (ref 150–400)
RBC: 3.66 MIL/uL — ABNORMAL LOW (ref 4.22–5.81)
RDW: 15.6 % — ABNORMAL HIGH (ref 11.5–15.5)
WBC: 7.1 10*3/uL (ref 4.0–10.5)

## 2013-09-08 LAB — BASIC METABOLIC PANEL
CO2: 23 mEq/L (ref 19–32)
Calcium: 9 mg/dL (ref 8.4–10.5)
Creatinine, Ser: 1.39 mg/dL — ABNORMAL HIGH (ref 0.50–1.35)
GFR calc non Af Amer: 46 mL/min — ABNORMAL LOW (ref >90)
Glucose, Bld: 92 mg/dL (ref 70–99)
Sodium: 137 mEq/L (ref 135–145)

## 2013-09-08 MED ORDER — HYDROCODONE-ACETAMINOPHEN 5-325 MG PO TABS: 1.0000 | ORAL_TABLET | Freq: Four times a day (QID) | ORAL | Status: DC | PRN

## 2013-09-08 MED ORDER — CIPROFLOXACIN HCL 250 MG PO TABS: 250.0000 mg | ORAL_TABLET | Freq: Two times a day (BID) | ORAL | Status: DC

## 2013-09-08 NOTE — Discharge Summary (Signed)
Date of admission: 09/07/2013  Date of discharge: 09/08/2013  Admission diagnosis: Urine retention  Discharge diagnosis: Urine Retention  Secondary diagnoses: BPH/atrial fibrillation  History and Physical: For full details, please see admission history and physical. Briefly, Drew Gibson is a 79 y.o. year old patient with above diagnosis. Admitted after TURP for retention.   Hospital Course: Normal post op course. On cardiac or bleeding issues.   Laboratory values:  Recent Labs  09/08/13 0530  HGB 11.3*  HCT 35.8*    Recent Labs  09/08/13 0530  CREATININE 1.39*    Disposition: Home  Discharge instruction: The patient was instructed to be ambulatory but told to refrain from heavy lifting, strenuous activity, or driving. Discussed  Discharge medications:    Medication List    STOP taking these medications       clopidogrel 75 MG tablet  Commonly known as:  PLAVIX      TAKE these medications       acetaminophen 500 MG tablet  Commonly known as:  TYLENOL  Take 500-1,000 mg by mouth every 6 (six) hours as needed.     allopurinol 100 MG tablet  Commonly known as:  ZYLOPRIM  Take 100 mg by mouth 2 (two) times daily.     b complex vitamins capsule  Take 1 capsule by mouth daily.     cholecalciferol 1000 UNITS tablet  Commonly known as:  VITAMIN D  Take 2,000 Units by mouth daily.     ciprofloxacin 250 MG tablet  Commonly known as:  CIPRO  Take 1 tablet (250 mg total) by mouth 2 (two) times daily.     HYDROcodone-acetaminophen 5-325 MG per tablet  Commonly known as:  NORCO  Take 1 tablet by mouth every 6 (six) hours as needed for moderate pain.     multivitamins ther. w/minerals Tabs tablet  Take 1 tablet by mouth daily.     OVER THE COUNTER MEDICATION  Apply 1 application topically at bedtime as needed (pain). Phys cream. For neuropathy pain     primidone 50 MG tablet  Commonly known as:  MYSOLINE  Take 50 mg by mouth at bedtime. 1 tablet in the  evening to help tremor        Followup:      Follow-up Information   Follow up with Alexias Margerum A, MD. (as scheduled)    Specialty:  Urology   Contact information:   [place] [address] [phone]

## 2013-09-08 NOTE — Care Management Note (Signed)
    Page 1 of 1   09/08/2013     1:00:07 PM   CARE MANAGEMENT NOTE 09/08/2013  Patient:  Drew Gibson, Drew Gibson   Account Number:  [id number]  Date Initiated:  09/08/2013  Documentation initiated by:  Dessa Phi  Subjective/Objective Assessment:   79 Y/O M ADMITTED W/URINE RETENTION.     Action/Plan:   FROM HOME.   Anticipated DC Date:  09/08/2013   Anticipated DC Plan:  HOME/SELF CARE         Choice offered to / List presented to:             Status of service:  Completed, signed off Medicare Important Message given?   (If response is "NO", the following Medicare IM given date fields will be blank) Date Medicare IM given:   Date Additional Medicare IM given:    Discharge Disposition:  HOME/SELF CARE  Per UR Regulation:  Reviewed for med. necessity/level of care/duration of stay  If discussed at [place] of Stay Meetings, dates discussed:    Comments:

## 2013-09-08 NOTE — Plan of Care (Signed)
Problem: Discharge Progression Outcomes Goal: Other Discharge Outcomes/Goals Outcome: Completed/Met Date Met:  09/08/13 Pt being d/ced with foley catheter in place per MD orders. Pt states that he had a catheter for 4 months prior to surgery and that he is comfortable with the catheter care. Pt's wife expressed that she was comfortable with catheter care as well.

## 2013-09-08 NOTE — Progress Notes (Signed)
Labs Ok Feels good No spasms Urine pink Trial of void and d/c home today

## 2013-09-17 ENCOUNTER — Telehealth: Payer: Self-pay | Admitting: Neurology

## 2013-09-17 NOTE — Telephone Encounter (Signed)
Called patient to remind him of 09/21/14 appointment, but patient wished to cancel because he feels it is unnecessary. Patient's wife states patient has question about when to return on Plavix, since he had prostate surgery about 2 weeks ago and had to stop taking it before surgery. Please call and advise.

## 2013-09-17 NOTE — Telephone Encounter (Signed)
I called patient. I talked with the wife. The patient recently had prostate surgery for benign enlargement. The patient had the surgery 2 weeks ago, okay to start the Plavix now. The patient also apparently had a femur fracture sometime in the last 4 or 5 months. He required some therapy following this. Okay to postpone revisit, the patient will call me if he believes he needs to come in.

## 2013-09-21 ENCOUNTER — Ambulatory Visit: Payer: Medicare Other | Admitting: Neurology

## 2013-09-25 ENCOUNTER — Encounter: Payer: Self-pay | Admitting: Emergency Medicine

## 2013-09-25 ENCOUNTER — Emergency Department
Admission: EM | Admit: 2013-09-25 | Discharge: 2013-09-25 | Disposition: A | Discharge status: Home / Self Care | Payer: Medicare Other | Source: Home / Self Care | Attending: Emergency Medicine | Admitting: Emergency Medicine

## 2013-09-25 DIAGNOSIS — J069 Acute upper respiratory infection, unspecified: Secondary | ICD-10-CM

## 2013-09-25 NOTE — ED Provider Notes (Signed)
CSN: [id number]     Arrival date & time 09/25/13  1134 History   First MD Initiated Contact with Patient 09/25/13 1140     Chief Complaint  Patient presents with  . Cough    off and on for a couple of days  . Sore Throat    for 1 day   (Consider location/radiation/quality/duration/timing/severity/associated sxs/prior Treatment) Patient is a 79 y.o. male presenting with cough and pharyngitis. The history is provided by the patient and the spouse.  Cough Sore Throat   URI HISTORY  Drew Gibson is a 79 y.o. male who complains of onset of mild cold symptoms for several days.  Have been using over-the-counter treatment which helps a little bit.  No chills/sweats No  Fever  + minimal Nasal congestion No Discolored Post-nasal drainage No sinus pain/pressure + minimal sore throat  + minimal nonproductive cough No wheezing No chest congestion No hemoptysis No shortness of breath No pleuritic pain  No itchy/red eyes No earache  No nausea No vomiting No abdominal pain No diarrhea  No skin rashes No new fatigue No myalgias No headache  Past Medical History  Diagnosis Date  . Gout     takes Allopurinol daily  . Peripheral neuropathy   . Enlarged prostate     takes Tamsulosin daily  . Hypertension     takes Enalapril daily   . Hyperlipidemia     but doesn't require meds  . Peripheral neuropathy   . TIA (transient ischemic attack)     sees Dr.Willis-neurologist 6'14(strange feeling of head)-no residual  . Urinary frequency   . Urinary urgency   . Macular degeneration     takes eye cap vits daily  . Foot drop, bilateral   . Gait disorder     balance issues-uses walker or mobile chair.  . Degenerative arthritis   . Ptosis     Left side  . Chronic kidney disease, stage III (moderate)   . Inguinal hernia without mention of obstruction or gangrene, unilateral or unspecified, (not specified as recurrent)   . Other abnormal blood chemistry   . Spinal stenosis, lumbar  region, with neurogenic claudication   . Unspecified vitamin D deficiency   . Essential and other specified forms of tremor   . Abnormality of gait   . Gout, unspecified   . Encounter for long-term (current) use of other medications   . Internal hemorrhoids without mention of complication   . Benign neoplasm of colon   . Diverticulosis of colon (without mention of hemorrhage)   . Duodenal ulcer, unspecified as acute or chronic, without hemorrhage, perforation, or obstruction   . Complication of anesthesia     postop headache after general anesthesia  . Foley catheter in place 09-01-13    4 months now  . Pneumonia     hx of;last time 8'14-mild.  Marland Kitchen UTI (lower urinary tract infection) 09-01-13    tx. 2 weeks ago   Past Surgical History  Procedure Laterality Date  . Hernia repair      at least 51yrs ago  . Left leg surgery  2011  . Colonosocpy    . Inguinal hernia repair  09/05/2012    Procedure: LAPAROSCOPIC INGUINAL HERNIA;  Surgeon: Gayland Curry, MD,FACS;  Location: [facility];  Service: General;  Laterality: Left;  . Insertion of mesh  09/05/2012    Procedure: INSERTION OF MESH;  Surgeon: Gayland Curry, MD,FACS;  Location: [facility];  Service: General;  Laterality: Left;  . Finger surgery  Left     Left ring finger  . Nerve biopsy    . Hip pinning,cannulated Left 05/05/2013    Procedure: CANNULATED HIP PINNING- left;  Surgeon: Renette Butters, MD;  Location: [facility];  Service: Orthopedics;  Laterality: Left;  . Transurethral resection of prostate N/A 09/07/2013    Procedure: CYSTO TRANSURETHRAL RESECTION OF THE PROSTATE (TURP);  Surgeon: Reece Packer, MD;  Location: [facility];  Service: Urology;  Laterality: N/A;   Family History  Problem Relation Age of Onset  . Diabetes Mother     type 2  . Leukemia Father   . COPD Brother   . Cancer Sister    History  Substance Use Topics  . 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 Smokeless tobacco: Not on file     Comment: 25 yrs ago  . Alcohol Use:  No    Review of Systems  Respiratory: Positive for cough.     Allergies  Aleve and Nsaids  Home Medications   Current Outpatient Rx  Name  Route  Sig  Dispense  Refill  . acetaminophen (TYLENOL) 500 MG tablet   Oral   Take 500-1,000 mg by mouth every 6 (six) hours as needed.         Marland Kitchen allopurinol (ZYLOPRIM) 100 MG tablet   Oral   Take 100 mg by mouth 2 (two) times daily.          Marland Kitchen b complex vitamins capsule   Oral   Take 1 capsule by mouth daily.          . cholecalciferol (VITAMIN D) 1000 UNITS tablet   Oral   Take 2,000 Units by mouth daily.          . ciprofloxacin (CIPRO) 250 MG tablet   Oral   Take 1 tablet (250 mg total) by mouth 2 (two) times daily.   10 tablet   0   . HYDROcodone-acetaminophen (NORCO) 5-325 MG per tablet   Oral   Take 1 tablet by mouth every 6 (six) hours as needed for moderate pain.   30 tablet   0   . Multiple Vitamins-Minerals (MULTIVITAMINS THER. W/MINERALS) TABS   Oral   Take 1 tablet by mouth daily.          Marland Kitchen OVER THE COUNTER MEDICATION   Topical   Apply 1 application topically at bedtime as needed (pain). Phys cream. For neuropathy pain         . primidone (MYSOLINE) 50 MG tablet   Oral   Take 50 mg by mouth at bedtime. 1 tablet in the evening to help tremor          BP 172/91  Pulse 95  Temp(Src) 97.9 F (36.6 C) (Oral)  Ht 5\' 10"  (1.778 m)  Wt 137 lb (62.143 kg)  BMI 19.66 kg/m2  SpO2 95% Physical Exam  Nursing note and vitals reviewed. Constitutional: He is oriented to person, place, and time. He appears well-developed and well-nourished. He is cooperative.  Non-toxic appearance. No distress.  HENT:  Head: Normocephalic and atraumatic.  Right Ear: Tympanic membrane, external ear and ear canal normal.  Left Ear: Tympanic membrane, external ear and ear canal normal.  Nose: Mucosal edema (Minimal) and rhinorrhea (minimal, serous) present. Right sinus exhibits no maxillary sinus tenderness and no  frontal sinus tenderness. Left sinus exhibits no maxillary sinus tenderness and no frontal sinus tenderness.  Mouth/Throat: Mucous membranes are normal. No oral lesions. Posterior oropharyngeal erythema (Minimal) present. No oropharyngeal  exudate, posterior oropharyngeal edema or tonsillar abscesses.  Eyes: Conjunctivae are normal. No scleral icterus.  Neck: Neck supple.  Cardiovascular: Normal rate, regular rhythm and normal heart sounds.   No murmur heard. Pulmonary/Chest: Effort normal and breath sounds normal. No stridor. No respiratory distress. He has no wheezes. He has no rales.  Musculoskeletal: He exhibits no edema.  Lymphadenopathy:    He has no cervical adenopathy.       Right cervical: No superficial cervical, no deep cervical and no posterior cervical adenopathy present.      Left cervical: No superficial cervical, no deep cervical and no posterior cervical adenopathy present.  Neurological: He is alert and oriented to person, place, and time.  Skin: Skin is warm and dry.  Psychiatric: He has a normal mood and affect.    ED Course  Procedures (including critical care time) Labs Review Labs Reviewed - No data to display Imaging Review No results found.  EKG Interpretation    Date/Time:    Ventricular Rate:    PR Interval:    QRS Duration:   QT Interval:    QTC Calculation:   R Axis:     Text Interpretation:              MDM   1. Viral URI with cough      Explained he likely has viral URI. No antibiotic indicated at this time. We discussed symptomatic care Precautions discussed. Red flags discussed. Questions invited and answered. Patient voiced understanding and agreement.   Jacqulyn Cane, MD 09/25/13 [phone]

## 2013-09-25 NOTE — ED Notes (Signed)
Reynold complains of cough and sore throat off and on for a couple of days. Denies fever, chills or sweats.

## 2013-09-29 ENCOUNTER — Ambulatory Visit (INDEPENDENT_AMBULATORY_CARE_PROVIDER_SITE_OTHER): Payer: Medicare Other | Admitting: Internal Medicine

## 2013-09-29 ENCOUNTER — Encounter: Payer: Self-pay | Admitting: Internal Medicine

## 2013-09-29 VITALS — BP 138/78 | HR 92 | Temp 96.6°F | Resp 16 | Wt 152.6 lb

## 2013-09-29 DIAGNOSIS — I10 Essential (primary) hypertension: Secondary | ICD-10-CM

## 2013-09-29 DIAGNOSIS — R1314 Dysphagia, pharyngoesophageal phase: Secondary | ICD-10-CM

## 2013-09-29 DIAGNOSIS — G250 Essential tremor: Secondary | ICD-10-CM

## 2013-09-29 DIAGNOSIS — M109 Gout, unspecified: Secondary | ICD-10-CM

## 2013-09-29 DIAGNOSIS — R1310 Dysphagia, unspecified: Secondary | ICD-10-CM | POA: Insufficient documentation

## 2013-09-29 DIAGNOSIS — G63 Polyneuropathy in diseases classified elsewhere: Secondary | ICD-10-CM

## 2013-09-29 MED ORDER — ALLOPURINOL 100 MG PO TABS: ORAL_TABLET | ORAL | Status: DC

## 2013-09-29 NOTE — Progress Notes (Signed)
Patient ID: Drew Gibson, male   DOB: 1929/03/08, 79 y.o.   MRN: [id number]    Location:    [facility]  Place of Service:  Office    Allergies  Allergen Reactions  . Aleve [Naproxen Sodium]     Upset stomach   . Nsaids     Bother his stomach    Chief Complaint  Patient presents with  . Medical Managment of Chronic Issues    3 month f/u   . Immunizations    declines Shingles vaccine  . other    cough for long time that is really bad in the morning    HPI:  Dysphagia, pharyngoesophageal phase: episodes of mild aspiration. Contributes to the cough.  Gout : no recent attacks  Polyneuropathy in other diseases classified elsewhere: unchanged. Progressive loss of muscular strength and cooordination  Hypertension: controlled  Essential and other specified forms of tremor: unchanged  Immunization History  Administered Date(s) Administered  . Influenza,inj,Quad PF,36+ Mos 07/07/2013  . Influenza-Unspecified 07/11/2011  . Pneumococcal Conjugate-13 11/02/1995  . Td 07/08/2006  He declines Zostavax   Medications: Patient's Medications  New Prescriptions   No medications on file  Previous Medications   ACETAMINOPHEN (TYLENOL) 500 MG TABLET    Take 500-1,000 mg by mouth every 6 (six) hours as needed.   ALLOPURINOL (ZYLOPRIM) 100 MG TABLET    Take 100 mg by mouth 2 (two) times daily.    B COMPLEX VITAMINS CAPSULE    Take 1 capsule by mouth daily.    CHOLECALCIFEROL (VITAMIN D) 1000 UNITS TABLET    Take 2,000 Units by mouth daily.    CLOPIDOGREL (PLAVIX) 75 MG TABLET    Take 75 mg by mouth daily with breakfast.   MULTIPLE VITAMINS-MINERALS (MULTIVITAMINS THER. W/MINERALS) TABS    Take 1 tablet by mouth daily.    OVER THE COUNTER MEDICATION    Apply 1 application topically at bedtime as needed (pain). Phys cream. For neuropathy pain   PRIMIDONE (MYSOLINE) 50 MG TABLET    Take 50 mg by mouth at bedtime. 1 tablet in the evening to help tremor  Modified Medications   No medications  on file  Discontinued Medications   CIPROFLOXACIN (CIPRO) 250 MG TABLET    Take 1 tablet (250 mg total) by mouth 2 (two) times daily.   HYDROCODONE-ACETAMINOPHEN (NORCO) 5-325 MG PER TABLET    Take 1 tablet by mouth every 6 (six) hours as needed for moderate pain.     Review of Systems  Constitutional: Positive for fatigue. Negative for fever.  HENT: Negative.   Eyes: Positive for visual disturbance (corrective lenses).  Respiratory: Negative.   Cardiovascular: Negative for chest pain, palpitations and leg swelling.  Gastrointestinal:       Episodes of aspiration. Swallowing has been affected by his neuro muscular disease.  Endocrine: Negative.   Genitourinary: Positive for frequency and difficulty urinating.       Catheterized due to obstructive uropathy from BPH. Prior issues with nocturia and urgency.  Musculoskeletal:       S/P left hip fx  Skin: Negative.   Neurological:       Polyneuropathy. Bilateral foot drop.  Hematological:       Anemia.  Psychiatric/Behavioral: Negative.     Filed Vitals:   09/29/13 1318  BP: 138/78  Pulse: 92  Temp: 96.6 F (35.9 C)  TempSrc: Oral  Resp: 16  Weight: 152 lb 9.6 oz (69.219 kg)  SpO2: 92%   Physical Exam  Constitutional:  He is oriented to person, place, and time.  Thin, frail   HENT:  Head: Normocephalic and atraumatic.  Right Ear: External ear normal.  Left Ear: External ear normal.  Nose: Nose normal.  Eyes: Conjunctivae and EOM are normal. Pupils are equal, round, and reactive to light.  Neck: No JVD present. No tracheal deviation present. No thyromegaly present.  Cardiovascular: Normal rate, regular rhythm, normal heart sounds and intact distal pulses.  Exam reveals no gallop and no friction rub.   No murmur heard. Pulmonary/Chest: No respiratory distress. He has no wheezes. He has no rales. He exhibits no tenderness.  Abdominal: He exhibits no distension and no mass. There is no tenderness.  Musculoskeletal: He  exhibits no edema and no tenderness.  Prior left hip fx. Healed incision. Unstable gait. Using walker.  Lymphadenopathy:    He has no cervical adenopathy.  Neurological: He is alert and oriented to person, place, and time. No cranial nerve deficit. Coordination abnormal.  Bilateral foot drop. Tremor. Generalized weakness based on neuromuscular disease.  Skin: No rash noted. No erythema. No pallor.  Psychiatric: He has a normal mood and affect. His behavior is normal. Judgment and thought content normal.     Labs reviewed: Admission on 09/07/2013, Discharged on 09/08/2013  Component Date Value Range Status  . Sodium 09/08/2013 137  135 - 145 mEq/L Final  . Potassium 09/08/2013 4.7  3.5 - 5.1 mEq/L Final  . Chloride 09/08/2013 105  96 - 112 mEq/L Final  . CO2 09/08/2013 23  19 - 32 mEq/L Final  . Glucose, Bld 09/08/2013 92  70 - 99 mg/dL Final  . BUN 09/08/2013 31* 6 - 23 mg/dL Final  . Creatinine, Ser 09/08/2013 1.39* 0.50 - 1.35 mg/dL Final  . Calcium 09/08/2013 9.0  8.4 - 10.5 mg/dL Final  . GFR calc non Af Amer 09/08/2013 46* >90 mL/min Final  . GFR calc Af Amer 09/08/2013 53* >90 mL/min Final   Comment: (NOTE)                          The eGFR has been calculated using the CKD EPI equation.                          This calculation has not been validated in all clinical situations.                          eGFR's persistently <90 mL/min signify possible Chronic Kidney                          Disease.  . WBC 09/08/2013 7.1  4.0 - 10.5 K/uL Final  . RBC 09/08/2013 3.66* 4.22 - 5.81 MIL/uL Final  . Hemoglobin 09/08/2013 11.3* 13.0 - 17.0 g/dL Final  . HCT 09/08/2013 35.8* 39.0 - 52.0 % Final  . MCV 09/08/2013 97.8  78.0 - 100.0 fL Final  . MCH 09/08/2013 30.9  26.0 - 34.0 pg Final  . MCHC 09/08/2013 31.6  30.0 - 36.0 g/dL Final  . RDW 09/08/2013 15.6* 11.5 - 15.5 % Final  . Platelets 09/08/2013 148* 150 - 400 K/uL Final  Hospital Outpatient Visit on 09/01/2013  Component Date  Value Range Status  . WBC 09/01/2013 8.0  4.0 - 10.5 K/uL Final  . RBC 09/01/2013 4.14* 4.22 - 5.81 MIL/uL Final  . Hemoglobin 09/01/2013 12.9*  13.0 - 17.0 g/dL Final  . HCT 09/01/2013 40.3  39.0 - 52.0 % Final  . MCV 09/01/2013 97.3  78.0 - 100.0 fL Final  . MCH 09/01/2013 31.2  26.0 - 34.0 pg Final  . MCHC 09/01/2013 32.0  30.0 - 36.0 g/dL Final  . RDW 09/01/2013 15.5  11.5 - 15.5 % Final  . Platelets 09/01/2013 202  150 - 400 K/uL Final  . Sodium 09/01/2013 137  135 - 145 mEq/L Final  . Potassium 09/01/2013 4.5  3.5 - 5.1 mEq/L Final  . Chloride 09/01/2013 101  96 - 112 mEq/L Final  . CO2 09/01/2013 26  19 - 32 mEq/L Final  . Glucose, Bld 09/01/2013 87  70 - 99 mg/dL Final  . BUN 09/01/2013 28* 6 - 23 mg/dL Final  . Creatinine, Ser 09/01/2013 1.36* 0.50 - 1.35 mg/dL Final  . Calcium 09/01/2013 9.4  8.4 - 10.5 mg/dL Final  . GFR calc non Af Amer 09/01/2013 47* >90 mL/min Final  . GFR calc Af Amer 09/01/2013 54* >90 mL/min Final   Comment: (NOTE)                          The eGFR has been calculated using the CKD EPI equation.                          This calculation has not been validated in all clinical situations.                          eGFR's persistently <90 mL/min signify possible Chronic Kidney                          Disease.  Office Visit on 07/07/2013  Component Date Value Range Status  . WBC 07/07/2013 8.9  3.4 - 10.8 x10E3/uL Final  . RBC 07/07/2013 3.69* 4.14 - 5.80 x10E6/uL Final  . Hemoglobin 07/07/2013 11.6* 12.6 - 17.7 g/dL Final  . HCT 07/07/2013 35.2* 37.5 - 51.0 % Final  . MCV 07/07/2013 95  79 - 97 fL Final  . MCH 07/07/2013 31.4  26.6 - 33.0 pg Final  . MCHC 07/07/2013 33.0  31.5 - 35.7 g/dL Final  . RDW 07/07/2013 16.1* 12.3 - 15.4 % Final  . Platelets 07/07/2013 211  150 - 379 x10E3/uL Final  . Glucose 07/07/2013 88  65 - 99 mg/dL Final  . BUN 07/07/2013 36* 8 - 27 mg/dL Final  . Creatinine, Ser 07/07/2013 1.92* 0.76 - 1.27 mg/dL Final  . GFR calc  non Af Amer 07/07/2013 32* >59 mL/min/1.73 Final  . GFR calc Af Amer 07/07/2013 37* >59 mL/min/1.73 Final  . BUN/Creatinine Ratio 07/07/2013 19  10 - 22 Final  . Sodium 07/07/2013 136  134 - 144 mmol/L Final  . Potassium 07/07/2013 4.7  3.5 - 5.2 mmol/L Final  . Chloride 07/07/2013 96* 97 - 108 mmol/L Final  . CO2 07/07/2013 25  18 - 29 mmol/L Final  . Calcium 07/07/2013 9.5  8.6 - 10.2 mg/dL Final  . Total Protein 07/07/2013 7.2  6.0 - 8.5 g/dL Final  . Albumin 07/07/2013 4.0  3.5 - 4.7 g/dL Final  . Globulin, Total 07/07/2013 3.2  1.5 - 4.5 g/dL Final  . Albumin/Globulin Ratio 07/07/2013 1.3  1.1 - 2.5 Final  . Total Bilirubin 07/07/2013 0.2  0.0 - 1.2 mg/dL Final  .  Alkaline Phosphatase 07/07/2013 83  39 - 117 IU/L Final  . AST 07/07/2013 28  0 - 40 IU/L Final  . ALT 07/07/2013 27  0 - 44 IU/L Final  . Uric Acid 07/07/2013 6.7  3.7 - 8.6 mg/dL Final              Therapeutic target for gout patients: <6.0      Assessment/Plan  Dysphagia, pharyngoesophageal phase: continueto observe  Gout - Plan: continue allopurinol (ZYLOPRIM) 100 MG tablet  Polyneuropathy in other diseases classified elsewhere: unchanged  Hypertension: controlled  Essential and other specified forms of tremor: unchanged

## 2013-09-29 NOTE — Patient Instructions (Signed)
Continue current medications. 

## 2013-11-04 ENCOUNTER — Ambulatory Visit: Payer: Medicare Other | Admitting: Internal Medicine

## 2013-12-29 ENCOUNTER — Ambulatory Visit (INDEPENDENT_AMBULATORY_CARE_PROVIDER_SITE_OTHER): Payer: Medicare Other | Admitting: Internal Medicine

## 2013-12-29 ENCOUNTER — Encounter: Payer: Self-pay | Admitting: Internal Medicine

## 2013-12-29 VITALS — BP 130/76 | HR 83 | Temp 97.3°F | Wt 160.2 lb

## 2013-12-29 DIAGNOSIS — G252 Other specified forms of tremor: Secondary | ICD-10-CM

## 2013-12-29 DIAGNOSIS — M109 Gout, unspecified: Secondary | ICD-10-CM

## 2013-12-29 DIAGNOSIS — R1314 Dysphagia, pharyngoesophageal phase: Secondary | ICD-10-CM

## 2013-12-29 DIAGNOSIS — E785 Hyperlipidemia, unspecified: Secondary | ICD-10-CM

## 2013-12-29 DIAGNOSIS — G250 Essential tremor: Secondary | ICD-10-CM

## 2013-12-29 DIAGNOSIS — G63 Polyneuropathy in diseases classified elsewhere: Secondary | ICD-10-CM

## 2013-12-29 DIAGNOSIS — N400 Enlarged prostate without lower urinary tract symptoms: Secondary | ICD-10-CM

## 2013-12-29 DIAGNOSIS — N183 Chronic kidney disease, stage 3 unspecified: Secondary | ICD-10-CM

## 2013-12-29 DIAGNOSIS — H6120 Impacted cerumen, unspecified ear: Secondary | ICD-10-CM

## 2013-12-29 DIAGNOSIS — D649 Anemia, unspecified: Secondary | ICD-10-CM

## 2013-12-29 DIAGNOSIS — I10 Essential (primary) hypertension: Secondary | ICD-10-CM

## 2013-12-29 DIAGNOSIS — R972 Elevated prostate specific antigen [PSA]: Secondary | ICD-10-CM

## 2013-12-29 NOTE — Patient Instructions (Signed)
Continue medications as listed 

## 2013-12-29 NOTE — Progress Notes (Signed)
Patient ID: Drew Gibson, male   DOB: 1928/11/20, 80 y.o.   MRN: [id number]    Location:  [facility]   Place of Service: OFFICE    Allergies  Allergen Reactions  . Aleve [Naproxen Sodium]     Upset stomach   . Nsaids     Bother his stomach    Chief Complaint  Patient presents with  . Medical Managment of Chronic Issues    3 month f/u with no recent labs, no refills today  . other    having hearing problems out of LT ear    HPI:  Polyneuropathy in other diseases classified elsewhere: unchanged  Dysphagia, pharyngoesophageal phase: unchanged  Gout: no recent attacks  Hypertension: controlled  Hyperlipidemia: recheck next visit  PSA elevation: normal biopsy in dec 2014  Essential and other specified forms of tremor: unchanged  CKD (chronic kidney disease), stage III: recheck next visit  BPH (benign prostatic hyperplasia): some hesitation  Anemia: recheck next visit  Impacted cerumen: left EAC    Medications: Patient's Medications  New Prescriptions   No medications on file  Previous Medications   ACETAMINOPHEN (TYLENOL) 500 MG TABLET    Take 500-1,000 mg by mouth every 6 (six) hours as needed.   ALLOPURINOL (ZYLOPRIM) 100 MG TABLET    One twice daily to prevent gout   B COMPLEX VITAMINS CAPSULE    Take 1 capsule by mouth daily.    CHOLECALCIFEROL (VITAMIN D) 1000 UNITS TABLET    Take 2,000 Units by mouth daily.    CLOPIDOGREL (PLAVIX) 75 MG TABLET    Take 75 mg by mouth daily with breakfast.   MULTIPLE VITAMINS-MINERALS (MULTIVITAMINS THER. W/MINERALS) TABS    Take 1 tablet by mouth daily.    OVER THE COUNTER MEDICATION    Apply 1 application topically at bedtime as needed (pain). Phys cream. For neuropathy pain   PRIMIDONE (MYSOLINE) 50 MG TABLET    Take 50 mg by mouth at bedtime. 1 tablet in the evening to help tremor  Modified Medications   No medications on file  Discontinued Medications   No medications on file     Review of Systems  Constitutional:  Positive for fatigue. Negative for fever.  HENT: Negative.   Eyes: Positive for visual disturbance (corrective lenses).  Respiratory: Negative.   Cardiovascular: Negative for chest pain, palpitations and leg swelling.  Gastrointestinal:       Episodes of aspiration. Swallowing has been affected by his neuro muscular disease.  Endocrine: Negative.   Genitourinary: Positive for frequency and difficulty urinating.       Catheterized due to obstructive uropathy from BPH. Prior issues with nocturia and urgency.  Musculoskeletal:       S/P left hip fx  Skin: Negative.   Neurological:       Polyneuropathy. Bilateral foot drop.  Hematological:       Anemia.  Psychiatric/Behavioral: Negative.     Filed Vitals:   12/29/13 1309  BP: 130/76  Pulse: 83  Temp: 97.3 F (36.3 C)  TempSrc: Oral  Weight: 160 lb 3.2 oz (72.666 kg)  SpO2: 96%   Physical Exam  Constitutional: He is oriented to person, place, and time.  Thin, frail   HENT:  Head: Normocephalic and atraumatic.  Right Ear: External ear normal.  Left Ear: External ear normal.  Nose: Nose normal.  Wax occlusion in tHe left EAC  Eyes: Conjunctivae and EOM are normal. Pupils are equal, round, and reactive to light.  Neck: No JVD  present. No tracheal deviation present. No thyromegaly present.  Cardiovascular: Normal rate, regular rhythm, normal heart sounds and intact distal pulses.  Exam reveals no gallop and no friction rub.   No murmur heard. Pulmonary/Chest: No respiratory distress. He has no wheezes. He has no rales. He exhibits no tenderness.  Abdominal: He exhibits no distension and no mass. There is no tenderness.  Musculoskeletal: He exhibits no edema and no tenderness.  Prior left hip fx. Healed incision. Unstable gait. Using walker.  Lymphadenopathy:    He has no cervical adenopathy.  Neurological: He is alert and oriented to person, place, and time. No cranial nerve deficit. Coordination abnormal.  Bilateral foot  drop. Tremor. Generalized weakness based on neuromuscular disease.  Skin: No rash noted. No erythema. No pallor.  Psychiatric: He has a normal mood and affect. His behavior is normal. Judgment and thought content normal.     Labs reviewed: No visits with results within 3 Month(s) from this visit. Latest known visit with results is:  Admission on 09/07/2013, Discharged on 09/08/2013  Component Date Value Ref Range Status  . Sodium 09/08/2013 137  135 - 145 mEq/L Final  . Potassium 09/08/2013 4.7  3.5 - 5.1 mEq/L Final  . Chloride 09/08/2013 105  96 - 112 mEq/L Final  . CO2 09/08/2013 23  19 - 32 mEq/L Final  . Glucose, Bld 09/08/2013 92  70 - 99 mg/dL Final  . BUN 09/08/2013 31* 6 - 23 mg/dL Final  . Creatinine, Ser 09/08/2013 1.39* 0.50 - 1.35 mg/dL Final  . Calcium 09/08/2013 9.0  8.4 - 10.5 mg/dL Final  . GFR calc non Af Amer 09/08/2013 46* >90 mL/min Final  . GFR calc Af Amer 09/08/2013 53* >90 mL/min Final   Comment: (NOTE)                          The eGFR has been calculated using the CKD EPI equation.                          This calculation has not been validated in all clinical situations.                          eGFR's persistently <90 mL/min signify possible Chronic Kidney                          Disease.  . WBC 09/08/2013 7.1  4.0 - 10.5 K/uL Final  . RBC 09/08/2013 3.66* 4.22 - 5.81 MIL/uL Final  . Hemoglobin 09/08/2013 11.3* 13.0 - 17.0 g/dL Final  . HCT 09/08/2013 35.8* 39.0 - 52.0 % Final  . MCV 09/08/2013 97.8  78.0 - 100.0 fL Final  . MCH 09/08/2013 30.9  26.0 - 34.0 pg Final  . MCHC 09/08/2013 31.6  30.0 - 36.0 g/dL Final  . RDW 09/08/2013 15.6* 11.5 - 15.5 % Final  . Platelets 09/08/2013 148* 150 - 400 K/uL Final      Assessment/Plan 1. Polyneuropathy in other diseases classified elsewhere unchanged  2. Dysphagia, pharyngoesophageal phase unchanged  3. Gout - Uric Acid; Future  4. Hypertension controlled - CMP; Future  5. Hyperlipidemia -  Lipid panel; Future  6. PSA elevation -PSA  7. Essential and other specified forms of tremor unchanged  8. CKD (chronic kidney disease), stage III -CMP  9. BPH (benign prostatic hyperplasia) Unchanged  10.  Anemia -CBC  11. Impacted cerumen Lavaged and removed

## 2014-01-05 ENCOUNTER — Other Ambulatory Visit: Payer: Self-pay | Admitting: Internal Medicine

## 2014-01-07 NOTE — Addendum Note (Signed)
Addended by: [name] on: 01/07/2014 02:01 PM   Modules accepted: Orders

## 2014-02-17 ENCOUNTER — Encounter: Payer: Self-pay | Admitting: Internal Medicine

## 2014-02-17 ENCOUNTER — Ambulatory Visit (INDEPENDENT_AMBULATORY_CARE_PROVIDER_SITE_OTHER): Payer: Medicare Other | Admitting: Internal Medicine

## 2014-02-17 VITALS — BP 150/88 | HR 63 | Temp 97.4°F | Resp 18 | Ht 70.0 in | Wt 160.6 lb

## 2014-02-17 DIAGNOSIS — E785 Hyperlipidemia, unspecified: Secondary | ICD-10-CM

## 2014-02-17 DIAGNOSIS — H8110 Benign paroxysmal vertigo, unspecified ear: Secondary | ICD-10-CM

## 2014-02-17 DIAGNOSIS — M109 Gout, unspecified: Secondary | ICD-10-CM

## 2014-02-17 DIAGNOSIS — I10 Essential (primary) hypertension: Secondary | ICD-10-CM

## 2014-02-17 DIAGNOSIS — R972 Elevated prostate specific antigen [PSA]: Secondary | ICD-10-CM

## 2014-02-17 DIAGNOSIS — D649 Anemia, unspecified: Secondary | ICD-10-CM

## 2014-02-17 NOTE — Patient Instructions (Addendum)
Continue current medications. Do scheduled lab today.

## 2014-02-17 NOTE — Progress Notes (Signed)
Patient ID: Drew Gibson, male   DOB: 07-18-1929, 80 y.o.   MRN: [id number]    Location:  [facility]  Place of Service: OFFICE    Allergies  Allergen Reactions  . Aleve [Naproxen Sodium]     Upset stomach   . Nsaids     Bother his stomach    Chief Complaint  Patient presents with  . Acute Visit    lightheadness, dizzy since Monday on and off    HPI:  Light headed 02/15/14 on rising. Yesterday felt normal. Today, was light headed again. No sense of spinning. Balance is worse. Denies change in hearing or pain in the ears. No palpitations. No change in vision. 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  Symptoms are worse if he turns hishead from side to side quickly or if he looks down and then up quickly.  Medications: Patient's Medications  New Prescriptions   No medications on file  Previous Medications   ACETAMINOPHEN (TYLENOL) 500 MG TABLET    Take 500-1,000 mg by mouth every 6 (six) hours as needed.   ALLOPURINOL (ZYLOPRIM) 100 MG TABLET    One twice daily to prevent gout   B COMPLEX VITAMINS CAPSULE    Take 1 capsule by mouth daily.    CHOLECALCIFEROL (VITAMIN D) 1000 UNITS TABLET    Take 2,000 Units by mouth daily.    CLOPIDOGREL (PLAVIX) 75 MG TABLET    Take 75 mg by mouth daily with breakfast.   MULTIPLE VITAMINS-MINERALS (MULTIVITAMINS THER. W/MINERALS) TABS    Take 1 tablet by mouth daily.    OVER THE COUNTER MEDICATION    Apply 1 application topically at bedtime as needed (pain). Phys cream. For neuropathy pain   PRIMIDONE (MYSOLINE) 50 MG TABLET    TAKE ONE TABLET BY MOUTH IN THE EVENING TO HELP WITH TREMOR  Modified Medications   No medications on file  Discontinued Medications   PRIMIDONE (MYSOLINE) 50 MG TABLET    Take 50 mg by mouth at bedtime. 1 tablet in the evening to help tremor     Review of Systems  Constitutional: Positive for fatigue. Negative for fever.  HENT:       Light headed and dizzy feelings  Eyes: Positive for visual disturbance  (corrective lenses).  Respiratory: Negative.   Cardiovascular: Negative for chest pain, palpitations and leg swelling.  Gastrointestinal:       Episodes of aspiration. Swallowing has been affected by his neuro muscular disease.  Endocrine: Negative.   Genitourinary: Positive for frequency and difficulty urinating.       Catheterized due to obstructive uropathy from BPH. Prior issues with nocturia and urgency.  Musculoskeletal:       S/P left hip fx  Skin: Negative.   Neurological:       Polyneuropathy. Bilateral foot drop.  Hematological:       Anemia.  Psychiatric/Behavioral: Negative.     Filed Vitals:   02/17/14 1223  BP: 150/88  Pulse: 63  Temp: 97.4 F (36.3 C)  TempSrc: Oral  Resp: 18  Height: 5' 10" (1.778 m)  Weight: 160 lb 9.6 oz (72.848 kg)  SpO2: 98%   Body mass index is 23.04 kg/(m^2).  Physical Exam  Constitutional: He is oriented to person, place, and time.  Thin, frail   HENT:  Head: Normocephalic and atraumatic.  Right Ear: External ear normal.  Left Ear: External ear normal.  Nose: Nose normal.  Eyes: Conjunctivae and EOM are normal. Pupils are equal, round,  and reactive to light.  Neck: No JVD present. No tracheal deviation present. No thyromegaly present.  Cardiovascular: Normal rate, regular rhythm, normal heart sounds and intact distal pulses.  Exam reveals no gallop and no friction rub.   No murmur heard. Pulmonary/Chest: No respiratory distress. He has no wheezes. He has no rales. He exhibits no tenderness.  Abdominal: He exhibits no distension and no mass. There is no tenderness.  Musculoskeletal: He exhibits no edema and no tenderness.  Prior left hip fx. Healed incision. Unstable gait. Using walker.Muscular wasting of hand muscles and forearm muscles and lower leg muscles.  Lymphadenopathy:    He has no cervical adenopathy.  Neurological: He is alert and oriented to person, place, and time. No cranial nerve deficit. Coordination abnormal.    Bilateral foot drop. Tremor. Generalized weakness based on neuromuscular disease.  Skin: No rash noted. No erythema. No pallor.  Psychiatric: He has a normal mood and affect. His behavior is normal. Judgment and thought content normal.     Labs reviewed: No visits with results within 3 Month(s) from this visit. Latest known visit with results is:  Admission on 09/07/2013, Discharged on 09/08/2013  Component Date Value Ref Range Status  . Sodium 09/08/2013 137  135 - 145 mEq/L Final  . Potassium 09/08/2013 4.7  3.5 - 5.1 mEq/L Final  . Chloride 09/08/2013 105  96 - 112 mEq/L Final  . CO2 09/08/2013 23  19 - 32 mEq/L Final  . Glucose, Bld 09/08/2013 92  70 - 99 mg/dL Final  . BUN 09/08/2013 31* 6 - 23 mg/dL Final  . Creatinine, Ser 09/08/2013 1.39* 0.50 - 1.35 mg/dL Final  . Calcium 09/08/2013 9.0  8.4 - 10.5 mg/dL Final  . GFR calc non Af Amer 09/08/2013 46* >90 mL/min Final  . GFR calc Af Amer 09/08/2013 53* >90 mL/min Final   Comment: (NOTE)                          The eGFR has been calculated using the CKD EPI equation.                          This calculation has not been validated in all clinical situations.                          eGFR's persistently <90 mL/min signify possible Chronic Kidney                          Disease.  . WBC 09/08/2013 7.1  4.0 - 10.5 K/uL Final  . RBC 09/08/2013 3.66* 4.22 - 5.81 MIL/uL Final  . Hemoglobin 09/08/2013 11.3* 13.0 - 17.0 g/dL Final  . HCT 09/08/2013 35.8* 39.0 - 52.0 % Final  . MCV 09/08/2013 97.8  78.0 - 100.0 fL Final  . MCH 09/08/2013 30.9  26.0 - 34.0 pg Final  . MCHC 09/08/2013 31.6  30.0 - 36.0 g/dL Final  . RDW 09/08/2013 15.6* 11.5 - 15.5 % Final  . Platelets 09/08/2013 148* 150 - 400 K/uL Final      Assessment/Plan  1. BPPV (benign paroxysmal positional vertigo) Acute onset. I have not medicated because his balance is so bad chronically. Provided educational reprint.  2. Hyperlipidemia - Lipid panel  3.  Hypertension controlled - CMP  4. Gout - Uric Acid  5. Anemia - CBC With differential/Platelet  6. PSA elevation - PSA

## 2014-02-18 LAB — COMPREHENSIVE METABOLIC PANEL
ALK PHOS: 87 IU/L (ref 39–117)
ALT: 20 IU/L (ref 0–44)
AST: 31 IU/L (ref 0–40)
Albumin/Globulin Ratio: 1.5 (ref 1.1–2.5)
Albumin: 3.9 g/dL (ref 3.5–4.7)
BUN / CREAT RATIO: 20 (ref 10–22)
BUN: 32 mg/dL — ABNORMAL HIGH (ref 8–27)
CO2: 23 mmol/L (ref 18–29)
CREATININE: 1.58 mg/dL — AB (ref 0.76–1.27)
Calcium: 9.3 mg/dL (ref 8.6–10.2)
Chloride: 101 mmol/L (ref 97–108)
GFR calc Af Amer: 46 mL/min/{1.73_m2} — ABNORMAL LOW (ref >59)
GFR, EST NON AFRICAN AMERICAN: 40 mL/min/{1.73_m2} — AB (ref >59)
GLOBULIN, TOTAL: 2.6 g/dL (ref 1.5–4.5)
Glucose: 81 mg/dL (ref 65–99)
Potassium: 4.6 mmol/L (ref 3.5–5.2)
SODIUM: 142 mmol/L (ref 134–144)
Total Bilirubin: 0.2 mg/dL (ref 0.0–1.2)
Total Protein: 6.5 g/dL (ref 6.0–8.5)

## 2014-02-18 LAB — CBC WITH DIFFERENTIAL
Basophils Absolute: 0 10*3/uL (ref 0.0–0.2)
Basos: 1 %
EOS: 3 %
Eosinophils Absolute: 0.2 10*3/uL (ref 0.0–0.4)
HEMATOCRIT: 38.7 % (ref 37.5–51.0)
Hemoglobin: 13.1 g/dL (ref 12.6–17.7)
Immature Grans (Abs): 0 10*3/uL (ref 0.0–0.1)
Immature Granulocytes: 0 %
LYMPHS ABS: 2.6 10*3/uL (ref 0.7–3.1)
Lymphs: 40 %
MCH: 32.1 pg (ref 26.6–33.0)
MCHC: 33.9 g/dL (ref 31.5–35.7)
MCV: 95 fL (ref 79–97)
MONOCYTES: 8 %
Monocytes Absolute: 0.5 10*3/uL (ref 0.1–0.9)
NEUTROS ABS: 3.1 10*3/uL (ref 1.4–7.0)
Neutrophils Relative %: 48 %
Platelets: 179 10*3/uL (ref 150–379)
RBC: 4.08 x10E6/uL — AB (ref 4.14–5.80)
RDW: 14.7 % (ref 12.3–15.4)
WBC: 6.4 10*3/uL (ref 3.4–10.8)

## 2014-02-18 LAB — LIPID PANEL
CHOLESTEROL TOTAL: 264 mg/dL — AB (ref 100–199)
Chol/HDL Ratio: 4.6 ratio units (ref 0.0–5.0)
HDL: 58 mg/dL (ref >39)
LDL Calculated: 181 mg/dL — ABNORMAL HIGH (ref 0–99)
TRIGLYCERIDES: 124 mg/dL (ref 0–149)
VLDL Cholesterol Cal: 25 mg/dL (ref 5–40)

## 2014-02-18 LAB — URIC ACID: Uric Acid: 6.5 mg/dL (ref 3.7–8.6)

## 2014-02-18 LAB — PSA: PSA: 5.5 ng/mL — ABNORMAL HIGH (ref 0.0–4.0)

## 2014-04-08 ENCOUNTER — Other Ambulatory Visit: Payer: Self-pay | Admitting: Neurology

## 2014-04-26 ENCOUNTER — Other Ambulatory Visit: Payer: Medicare Other

## 2014-04-28 ENCOUNTER — Ambulatory Visit (INDEPENDENT_AMBULATORY_CARE_PROVIDER_SITE_OTHER): Payer: Medicare Other | Admitting: Internal Medicine

## 2014-04-28 ENCOUNTER — Encounter: Payer: Self-pay | Admitting: Internal Medicine

## 2014-04-28 VITALS — BP 130/82 | HR 67 | Temp 97.6°F | Wt 161.0 lb

## 2014-04-28 DIAGNOSIS — H8110 Benign paroxysmal vertigo, unspecified ear: Secondary | ICD-10-CM

## 2014-04-28 DIAGNOSIS — Z9181 History of falling: Secondary | ICD-10-CM

## 2014-04-28 DIAGNOSIS — M1A00X0 Idiopathic chronic gout, unspecified site, without tophus (tophi): Secondary | ICD-10-CM

## 2014-04-28 DIAGNOSIS — L989 Disorder of the skin and subcutaneous tissue, unspecified: Secondary | ICD-10-CM | POA: Insufficient documentation

## 2014-04-28 DIAGNOSIS — R269 Unspecified abnormalities of gait and mobility: Secondary | ICD-10-CM

## 2014-04-28 DIAGNOSIS — N183 Chronic kidney disease, stage 3 unspecified: Secondary | ICD-10-CM

## 2014-04-28 DIAGNOSIS — R972 Elevated prostate specific antigen [PSA]: Secondary | ICD-10-CM

## 2014-04-28 DIAGNOSIS — M24549 Contracture, unspecified hand: Secondary | ICD-10-CM

## 2014-04-28 DIAGNOSIS — I10 Essential (primary) hypertension: Secondary | ICD-10-CM

## 2014-04-28 DIAGNOSIS — G63 Polyneuropathy in diseases classified elsewhere: Secondary | ICD-10-CM

## 2014-04-28 DIAGNOSIS — D649 Anemia, unspecified: Secondary | ICD-10-CM

## 2014-04-28 DIAGNOSIS — M1A9XX0 Chronic gout, unspecified, without tophus (tophi): Secondary | ICD-10-CM

## 2014-04-28 DIAGNOSIS — E785 Hyperlipidemia, unspecified: Secondary | ICD-10-CM

## 2014-04-28 NOTE — Progress Notes (Signed)
Patient ID: Drew Gibson, male   DOB: 08/20/1929, 80 y.o.   MRN: [id number]    Location:    [facility]  Place of Service:  OFFICE    Allergies  Allergen Reactions  . Aleve [Naproxen Sodium]     Upset stomach   . Nsaids     Bother his stomach  . Antihistamines, Diphenhydramine-Type     Causes urinary issues    Chief Complaint  Patient presents with  . Medical Management of Chronic Issues    4 month f/u with no labs prior.  . Back Pain    having loser back pain x 4 weeks (fell 3 x since last OV)    HPI:  History of fall; NON injurious  Contracture of joint of hand, unspecified laterality: increasing to the point that it gets in the way of doing things  Skin lesion of left leg: chronic indurated and red area of the left leg medially above the ankle. Sometimes has itching, burning, and scaling.  BPPV (benign paroxysmal positional vertigo), unspecified laterality: resolved  Anemia, unspecified anemia type; recheck lab next visit  Chronic gout without tophus, unspecified cause, unspecified site: no recent attacks  CKD (chronic kidney disease), stage III: unchanged  Abnormality of gait: unchanged  Essential hypertension: controlled  PSA elevation: needs follow up  Polyneuropathy in other diseases classified elsewhere: creates muscular atrophy, gait disturbance, and paresthesias  Hyperlipidemia: follow up    Medications: Patient's Medications  New Prescriptions   No medications on file  Previous Medications   ACETAMINOPHEN (TYLENOL) 500 MG TABLET    Take 500-1,000 mg by mouth every 6 (six) hours as needed.   ALLOPURINOL (ZYLOPRIM) 100 MG TABLET    One twice daily to prevent gout   B COMPLEX VITAMINS CAPSULE    Take 1 capsule by mouth daily.    CHOLECALCIFEROL (VITAMIN D) 1000 UNITS TABLET    Take 2,000 Units by mouth daily.    CLOPIDOGREL (PLAVIX) 75 MG TABLET    TAKE ONE TABLET BY MOUTH ONCE DAILY   MULTIPLE VITAMINS-MINERALS (MULTIVITAMINS THER. W/MINERALS) TABS     Take 1 tablet by mouth daily.    OVER THE COUNTER MEDICATION    Apply 1 application topically at bedtime as needed (pain). Phys cream. For neuropathy pain   PRIMIDONE (MYSOLINE) 50 MG TABLET    TAKE ONE TABLET BY MOUTH IN THE EVENING TO HELP WITH TREMOR  Modified Medications   No medications on file  Discontinued Medications   CLOPIDOGREL (PLAVIX) 75 MG TABLET    Take 75 mg by mouth daily with breakfast.     Review of Systems  Constitutional: Positive for fatigue. Negative for fever.  HENT:       Light headed and dizzy feelings  Eyes: Positive for visual disturbance (corrective lenses).  Respiratory: Negative.   Cardiovascular: Negative for chest pain, palpitations and leg swelling.  Gastrointestinal:       Episodes of aspiration. Swallowing has been affected by his neuro muscular disease.  Endocrine: Negative.   Genitourinary: Positive for frequency and difficulty urinating.       Catheterized due to obstructive uropathy from BPH. Prior issues with nocturia and urgency.  Musculoskeletal:       S/P left hip fx  Skin: Negative.   Neurological:       Polyneuropathy. Bilateral foot drop.  Hematological:       Anemia.  Psychiatric/Behavioral: Negative.     Filed Vitals:   04/28/14 1215  BP: 130/82  Pulse: 67  Temp: 97.6 F (36.4 C)  TempSrc: Oral  Weight: 161 lb (73.029 kg)  SpO2: 93%   Body mass index is 23.1 kg/(m^2).  Physical Exam  Constitutional: He is oriented to person, place, and time.  Thin, frail   HENT:  Head: Normocephalic and atraumatic.  Right Ear: External ear normal.  Left Ear: External ear normal.  Nose: Nose normal.  Eyes: Conjunctivae and EOM are normal. Pupils are equal, round, and reactive to light.  Neck: No JVD present. No tracheal deviation present. No thyromegaly present.  Cardiovascular: Normal rate, regular rhythm, normal heart sounds and intact distal pulses.  Exam reveals no gallop and no friction rub.   No murmur  heard. Pulmonary/Chest: No respiratory distress. He has no wheezes. He has no rales. He exhibits no tenderness.  Abdominal: He exhibits no distension and no mass. There is no tenderness.  Musculoskeletal: He exhibits no edema and no tenderness.  Prior left hip fx. Healed incision. Unstable gait. Using walker.Muscular wasting of hand muscles and forearm muscles and lower leg muscles. Contractures of all fingers in both hands.  Lymphadenopathy:    He has no cervical adenopathy.  Neurological: He is alert and oriented to person, place, and time. No cranial nerve deficit. Coordination abnormal.  Bilateral foot drop. Tremor. Generalized weakness based on neuromuscular disease.  Skin: No rash noted. No erythema. No pallor.  Psychiatric: He has a normal mood and affect. His behavior is normal. Judgment and thought content normal.     Labs reviewed: Office Visit on 02/17/2014  Component Date Value Ref Range Status  . Cholesterol, Total 02/17/2014 264* 100 - 199 mg/dL Final  . Triglycerides 02/17/2014 124  0 - 149 mg/dL Final  . HDL 02/17/2014 58  >39 mg/dL Final   Comment: According to ATP-III Guidelines, HDL-C >59 mg/dL is considered a                          negative risk factor for CHD.  Marland Kitchen VLDL Cholesterol Cal 02/17/2014 25  5 - 40 mg/dL Final  . LDL Calculated 02/17/2014 181* 0 - 99 mg/dL Final  . Chol/HDL Ratio 02/17/2014 4.6  0.0 - 5.0 ratio units Final   Comment:                                   T. Chol/HDL Ratio                                                                      Men  Women                                                        1/2 Avg.Risk  3.4    3.3  Avg.Risk  5.0    4.4                                                         2X Avg.Risk  9.6    7.1                                                         3X Avg.Risk 23.4   11.0  . Glucose 02/17/2014 81  65 - 99 mg/dL Final  . BUN 02/17/2014 32* 8 - 27  mg/dL Final  . Creatinine, Ser 02/17/2014 1.58* 0.76 - 1.27 mg/dL Final  . GFR calc non Af Amer 02/17/2014 40* >59 mL/min/1.73 Final  . GFR calc Af Amer 02/17/2014 46* >59 mL/min/1.73 Final  . BUN/Creatinine Ratio 02/17/2014 20  10 - 22 Final  . Sodium 02/17/2014 142  134 - 144 mmol/L Final  . Potassium 02/17/2014 4.6  3.5 - 5.2 mmol/L Final  . Chloride 02/17/2014 101  97 - 108 mmol/L Final  . CO2 02/17/2014 23  18 - 29 mmol/L Final  . Calcium 02/17/2014 9.3  8.6 - 10.2 mg/dL Final  . Total Protein 02/17/2014 6.5  6.0 - 8.5 g/dL Final  . Albumin 02/17/2014 3.9  3.5 - 4.7 g/dL Final  . Globulin, Total 02/17/2014 2.6  1.5 - 4.5 g/dL Final  . Albumin/Globulin Ratio 02/17/2014 1.5  1.1 - 2.5 Final  . Total Bilirubin 02/17/2014 0.2  0.0 - 1.2 mg/dL Final  . Alkaline Phosphatase 02/17/2014 87  39 - 117 IU/L Final  . AST 02/17/2014 31  0 - 40 IU/L Final  . ALT 02/17/2014 20  0 - 44 IU/L Final  . Uric Acid 02/17/2014 6.5  3.7 - 8.6 mg/dL Final              Therapeutic target for gout patients: <6.0  . WBC 02/17/2014 6.4  3.4 - 10.8 x10E3/uL Final  . RBC 02/17/2014 4.08* 4.14 - 5.80 x10E6/uL Final  . Hemoglobin 02/17/2014 13.1  12.6 - 17.7 g/dL Final  . HCT 02/17/2014 38.7  37.5 - 51.0 % Final  . MCV 02/17/2014 95  79 - 97 fL Final  . MCH 02/17/2014 32.1  26.6 - 33.0 pg Final  . MCHC 02/17/2014 33.9  31.5 - 35.7 g/dL Final  . RDW 02/17/2014 14.7  12.3 - 15.4 % Final  . Platelets 02/17/2014 179  150 - 379 x10E3/uL Final  . Neutrophils Relative % 02/17/2014 48   Final  . Lymphs 02/17/2014 40   Final  . Monocytes 02/17/2014 8   Final  . Eos 02/17/2014 3   Final  . Basos 02/17/2014 1   Final  . Neutrophils Absolute 02/17/2014 3.1  1.4 - 7.0 x10E3/uL Final  . Lymphocytes Absolute 02/17/2014 2.6  0.7 - 3.1 x10E3/uL Final  . Monocytes Absolute 02/17/2014 0.5  0.1 - 0.9 x10E3/uL Final  . Eosinophils Absolute 02/17/2014 0.2  0.0 - 0.4 x10E3/uL Final  . Basophils Absolute 02/17/2014 0.0  0.0 - 0.2  x10E3/uL Final  . Immature Granulocytes 02/17/2014 0   Final  . Immature Grans (Abs) 02/17/2014 0.0  0.0 - 0.1 x10E3/uL  Final  . PSA 02/17/2014 5.5* 0.0 - 4.0 ng/mL Final   Comment: Roche ECLIA methodology.                          According to the American [employer] Association, Serum PSA should                          decrease and remain at undetectable levels after radical                          prostatectomy. The AUA defines biochemical recurrence as an initial                          PSA value 0.2 ng/mL or greater followed by a subsequent confirmatory                          PSA value 0.2 ng/mL or greater.                          Values obtained with different assay methods or kits cannot be used                          interchangeably. Results cannot be interpreted as absolute evidence                          of the presence or absence of malignant disease.      Assessment/Plan  1. History of fall Observe Continue use of walker.  2. Contracture of joint of hand, unspecified laterality - Ambulatory referral to Hand Surgery  3. Skin lesion of left leg 1% HC cream as needed for symptoms.  4. BPPV (benign paroxysmal positional vertigo), unspecified laterality improved  5. Anemia, unspecified anemia type - CBC With differential/Platelet; Future  6. Chronic gout without tophus, unspecified cause, unspecified site - Uric Acid  7. CKD (chronic kidney disease), stage III - Comprehensive metabolic panel; Future  8. Abnormality of gait Use walker. At risk for falls.  9. Essential hypertension controlled  10. PSA elevation -PSA, future - PSA  11. Polyneuropathy in other diseases classified elsewhere obsereve  12. Hyperlipidemia - Lipid panel; Future

## 2014-05-11 ENCOUNTER — Other Ambulatory Visit: Payer: Self-pay | Admitting: *Deleted

## 2014-05-11 ENCOUNTER — Other Ambulatory Visit: Payer: Self-pay | Admitting: Neurology

## 2014-05-11 MED ORDER — CLOPIDOGREL BISULFATE 75 MG PO TABS: ORAL_TABLET | ORAL | Status: DC

## 2014-05-11 NOTE — Telephone Encounter (Signed)
[facility] 

## 2014-05-12 ENCOUNTER — Other Ambulatory Visit: Payer: Self-pay | Admitting: *Deleted

## 2014-05-12 MED ORDER — CLOPIDOGREL BISULFATE 75 MG PO TABS: ORAL_TABLET | ORAL | Status: DC

## 2014-05-12 NOTE — Telephone Encounter (Signed)
[facility] 

## 2014-06-14 ENCOUNTER — Other Ambulatory Visit: Payer: Self-pay | Admitting: *Deleted

## 2014-06-14 MED ORDER — CLOPIDOGREL BISULFATE 75 MG PO TABS: ORAL_TABLET | ORAL | Status: DC

## 2014-06-14 NOTE — Telephone Encounter (Signed)
Patient Requested to be faxed to pharmacy. 

## 2014-07-08 ENCOUNTER — Other Ambulatory Visit: Payer: Self-pay | Admitting: Internal Medicine

## 2014-07-23 ENCOUNTER — Ambulatory Visit (INDEPENDENT_AMBULATORY_CARE_PROVIDER_SITE_OTHER): Payer: Medicare Other

## 2014-07-23 DIAGNOSIS — Z23 Encounter for immunization: Secondary | ICD-10-CM

## 2014-07-25 IMAGING — CR DG CHEST 1V
1 series · 1 of 1 positions shown · non-contrast
Comparison: 09/04/2012

CLINICAL DATA: Fall, left hip pain

CHEST - 1 VIEW

[t chest supine]
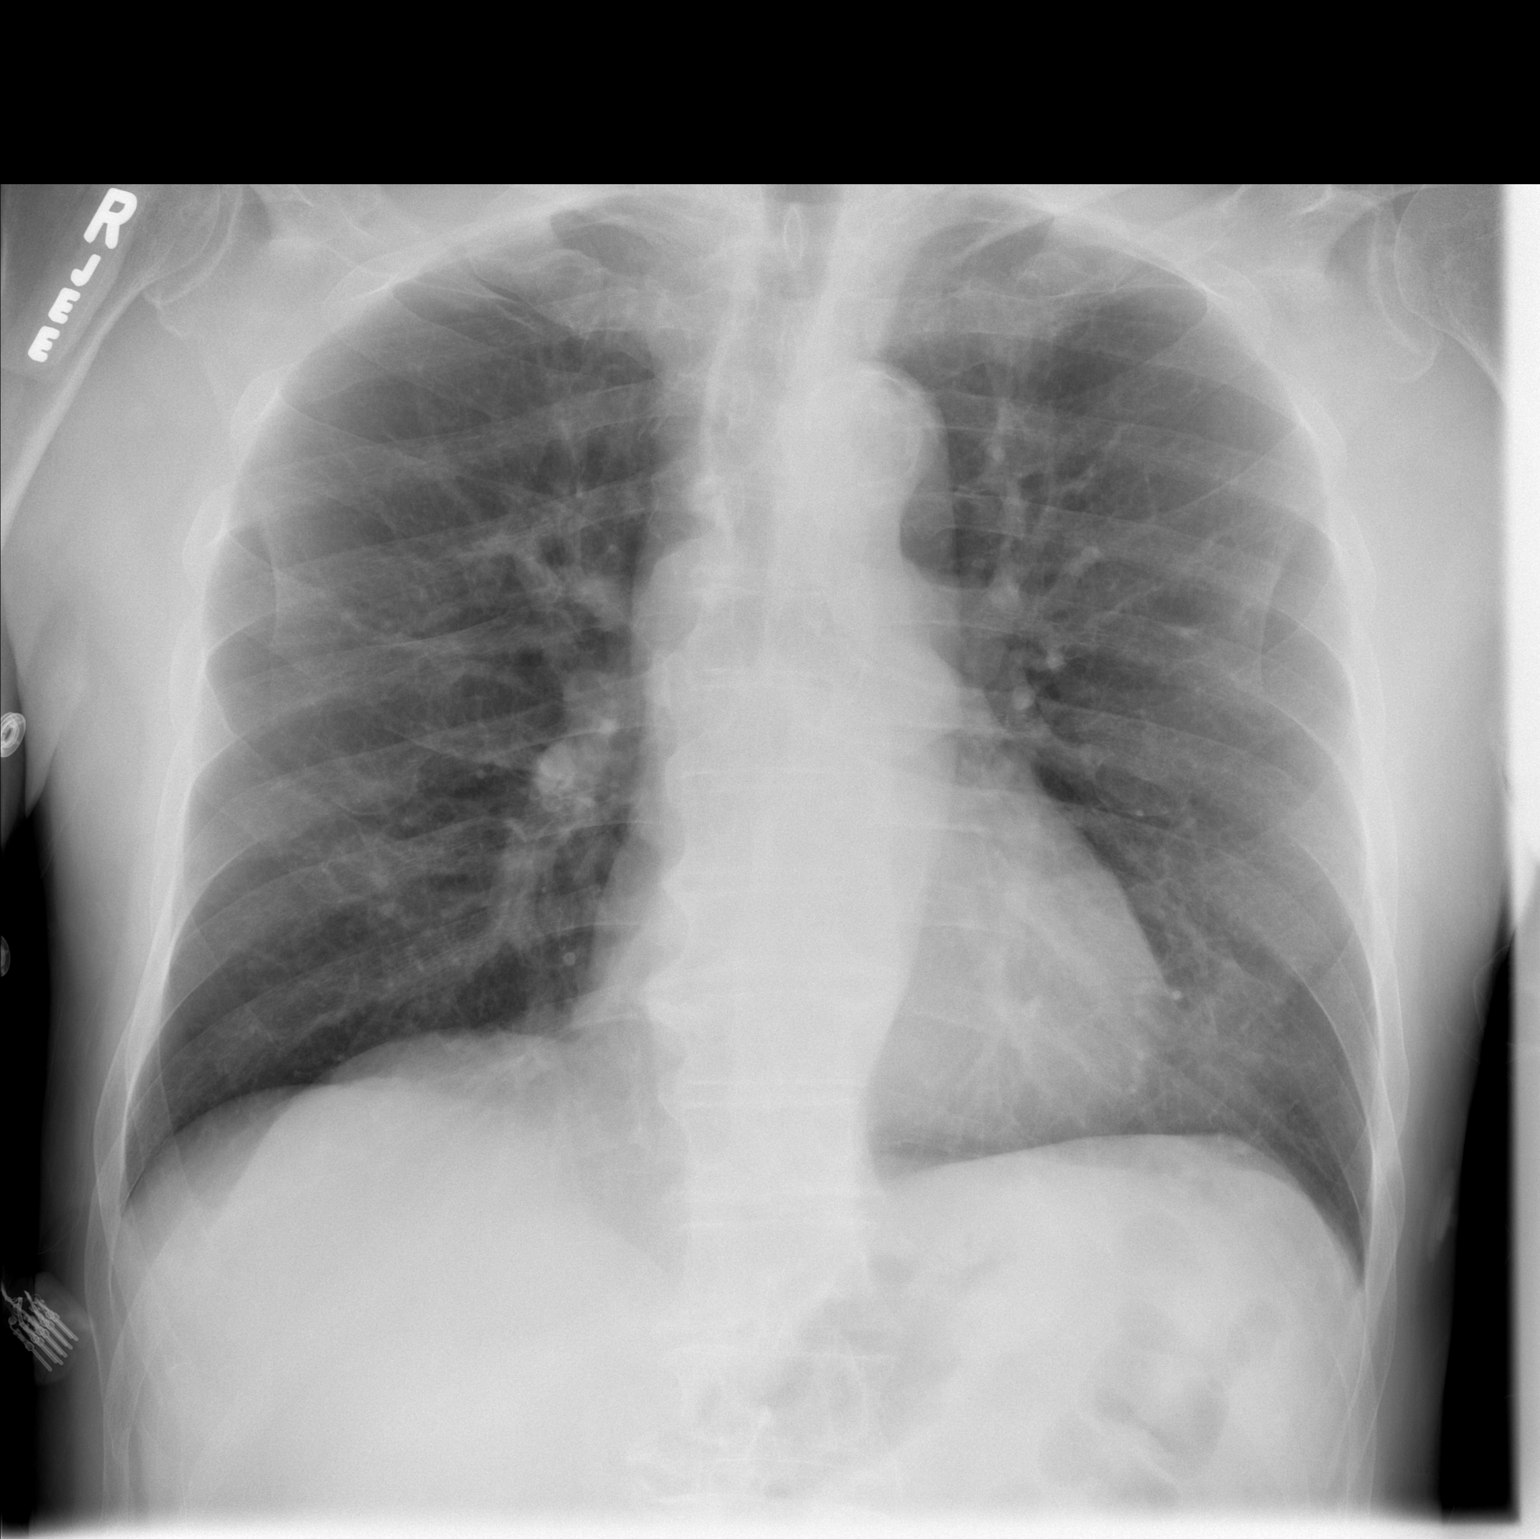

[1 of 1 positions shown; findings below may reference images not displayed]

FINDINGS: Cardiomediastinal silhouette is stable.  No acute
infiltrate or pleural effusion.  No pulmonary edema.
Atherosclerotic calcifications of thoracic aorta again noted.
Stable degenerative changes thoracic spine.
IMPRESSION: No active disease.  No significant change.

## 2014-07-26 IMAGING — RF DG HIP OPERATIVE*L*
1 series · 3 of 3 positions shown · non-contrast
Comparison: Left hip x-rays yesterday.

CLINICAL DATA: ORIF intertrochanteric left femoral neck fracture.

OPERATIVE LEFT HIP 2 VIEWS

[Series 1: run · 3 of 3 slices shown]
[im 1/3]
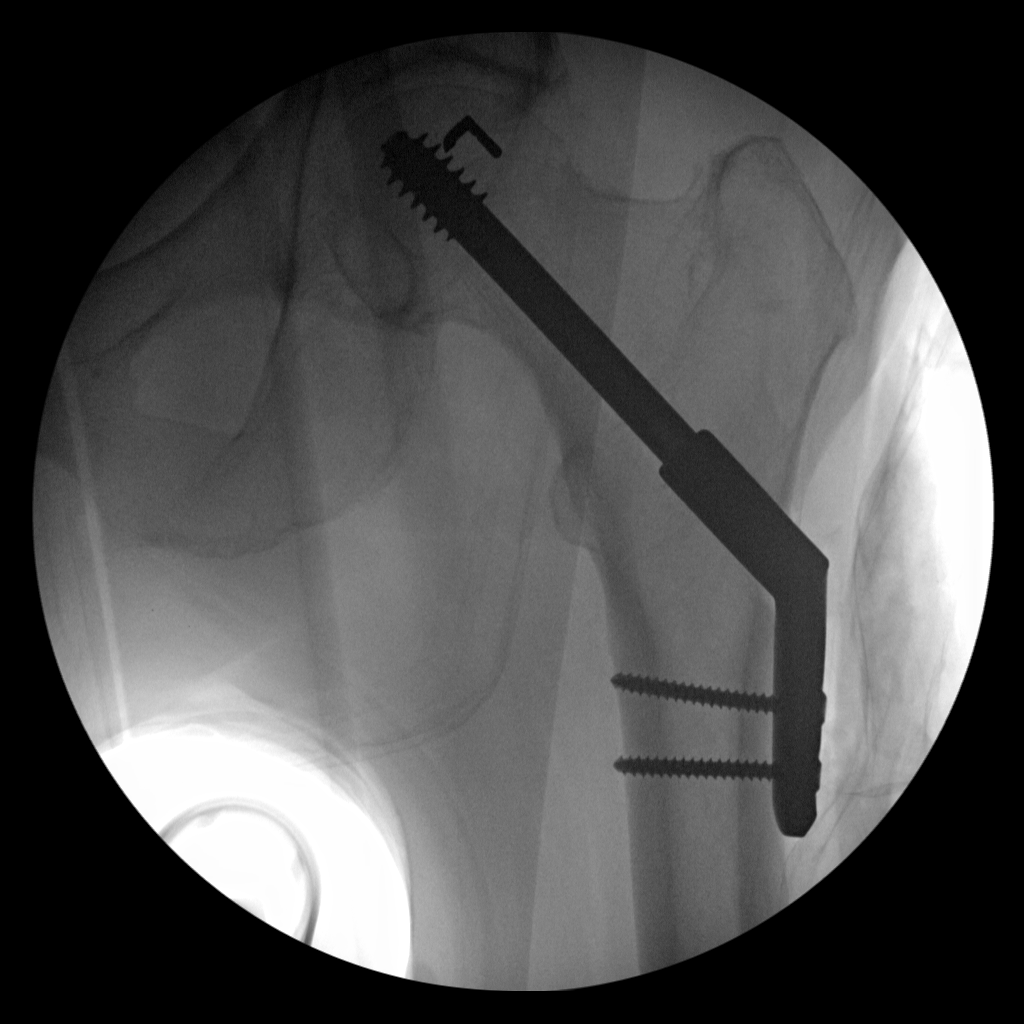
[im 2/3]
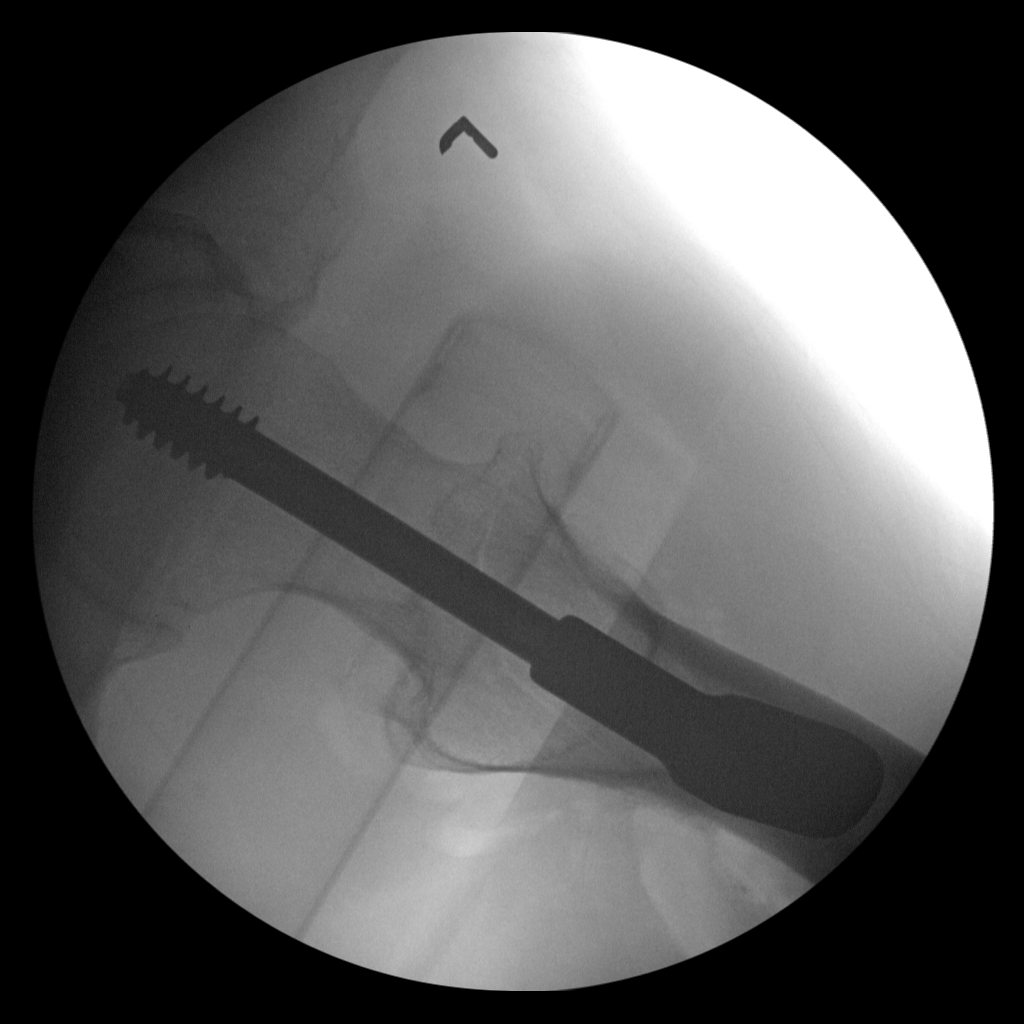
[im 3/3]
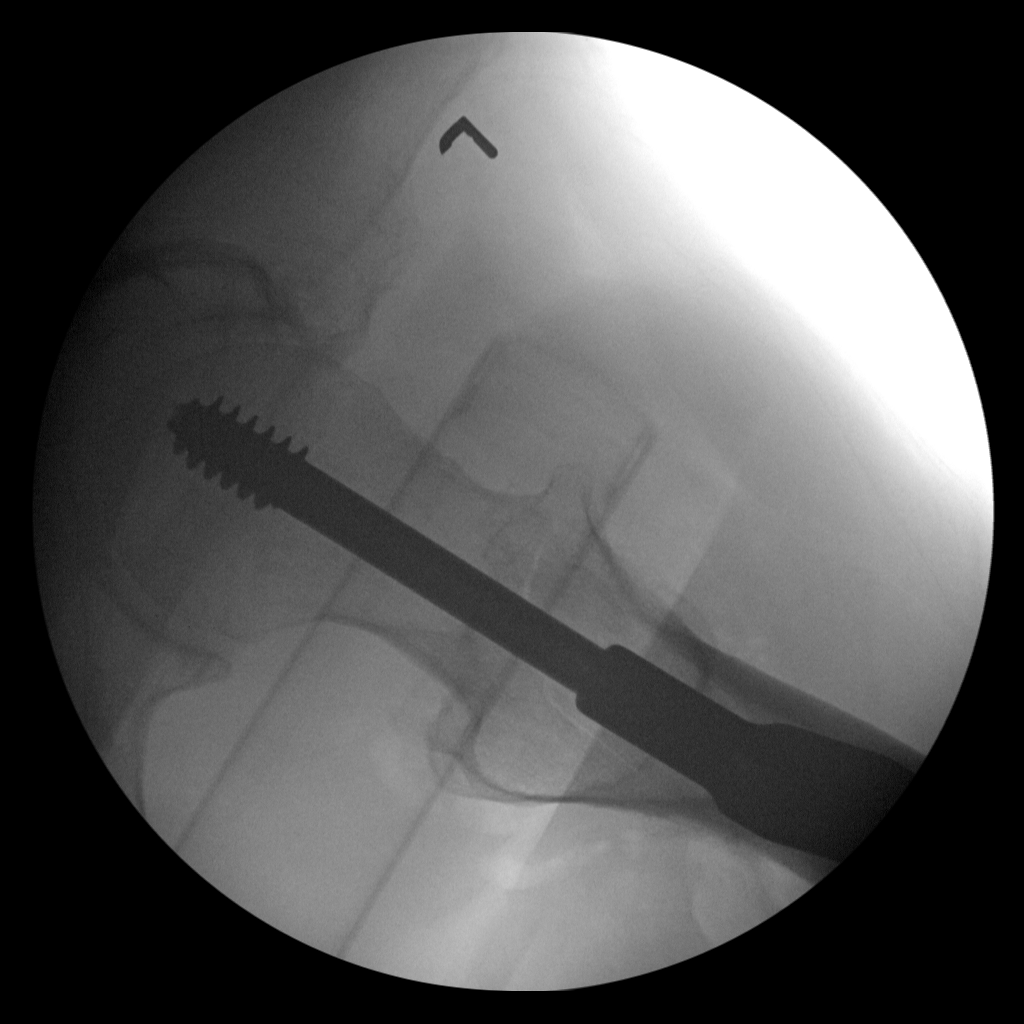

[3 of 3 positions shown; findings below may reference images not displayed]

FINDINGS: 3 spot images from the C-arm fluoroscopic device, AP and
lateral views of the left hip were submitted for interpretation
post-operatively.  Anatomic alignment post ORIF.  No visible
complicating features.
IMPRESSION: Anatomic alignment post ORIF left intertrochanteric femoral neck
fracture without acute complicating features.

## 2014-08-11 ENCOUNTER — Telehealth: Payer: Self-pay | Admitting: *Deleted

## 2014-08-11 NOTE — Telephone Encounter (Signed)
Patient wife, Inez Catalina came into office with a Handicap Placard requesting signature. I filled out form and Dr. Nyoka Cowden signed. Made copy for scanning and gave patient wife form.

## 2014-08-25 ENCOUNTER — Other Ambulatory Visit: Payer: Medicare Other

## 2014-08-25 DIAGNOSIS — M1A9XX0 Chronic gout, unspecified, without tophus (tophi): Secondary | ICD-10-CM

## 2014-08-25 DIAGNOSIS — R972 Elevated prostate specific antigen [PSA]: Secondary | ICD-10-CM

## 2014-08-25 DIAGNOSIS — D649 Anemia, unspecified: Secondary | ICD-10-CM

## 2014-08-25 DIAGNOSIS — N183 Chronic kidney disease, stage 3 unspecified: Secondary | ICD-10-CM

## 2014-08-25 DIAGNOSIS — E785 Hyperlipidemia, unspecified: Secondary | ICD-10-CM

## 2014-08-26 LAB — COMPREHENSIVE METABOLIC PANEL
A/G RATIO: 1.3 (ref 1.1–2.5)
ALBUMIN: 3.9 g/dL (ref 3.5–4.7)
ALT: 23 IU/L (ref 0–44)
AST: 23 IU/L (ref 0–40)
Alkaline Phosphatase: 77 IU/L (ref 39–117)
BILIRUBIN TOTAL: 0.4 mg/dL (ref 0.0–1.2)
BUN/Creatinine Ratio: 22 (ref 10–22)
BUN: 32 mg/dL — AB (ref 8–27)
CO2: 26 mmol/L (ref 18–29)
Calcium: 9.4 mg/dL (ref 8.6–10.2)
Chloride: 103 mmol/L (ref 97–108)
Creatinine, Ser: 1.45 mg/dL — ABNORMAL HIGH (ref 0.76–1.27)
GFR calc non Af Amer: 44 mL/min/{1.73_m2} — ABNORMAL LOW (ref >59)
GFR, EST AFRICAN AMERICAN: 51 mL/min/{1.73_m2} — AB (ref >59)
GLOBULIN, TOTAL: 2.9 g/dL (ref 1.5–4.5)
GLUCOSE: 83 mg/dL (ref 65–99)
Potassium: 4.8 mmol/L (ref 3.5–5.2)
Sodium: 143 mmol/L (ref 134–144)
TOTAL PROTEIN: 6.8 g/dL (ref 6.0–8.5)

## 2014-08-26 LAB — CBC WITH DIFFERENTIAL
BASOS ABS: 0 10*3/uL (ref 0.0–0.2)
Basos: 1 %
Eos: 4 %
Eosinophils Absolute: 0.2 10*3/uL (ref 0.0–0.4)
HCT: 40.5 % (ref 37.5–51.0)
Hemoglobin: 13.6 g/dL (ref 12.6–17.7)
Immature Grans (Abs): 0 10*3/uL (ref 0.0–0.1)
Immature Granulocytes: 0 %
LYMPHS ABS: 2.5 10*3/uL (ref 0.7–3.1)
Lymphs: 43 %
MCH: 32.9 pg (ref 26.6–33.0)
MCHC: 33.6 g/dL (ref 31.5–35.7)
MCV: 98 fL — AB (ref 79–97)
Monocytes Absolute: 0.4 10*3/uL (ref 0.1–0.9)
Monocytes: 7 %
NEUTROS ABS: 2.6 10*3/uL (ref 1.4–7.0)
Neutrophils Relative %: 45 %
Platelets: 173 10*3/uL (ref 150–379)
RBC: 4.14 x10E6/uL (ref 4.14–5.80)
RDW: 15.5 % — AB (ref 12.3–15.4)
WBC: 5.7 10*3/uL (ref 3.4–10.8)

## 2014-08-26 LAB — LIPID PANEL
Chol/HDL Ratio: 5.1 ratio units — ABNORMAL HIGH (ref 0.0–5.0)
Cholesterol, Total: 287 mg/dL — ABNORMAL HIGH (ref 100–199)
HDL: 56 mg/dL (ref >39)
LDL CALC: 208 mg/dL — AB (ref 0–99)
TRIGLYCERIDES: 115 mg/dL (ref 0–149)
VLDL Cholesterol Cal: 23 mg/dL (ref 5–40)

## 2014-08-26 LAB — PSA: PSA: 7.3 ng/mL — ABNORMAL HIGH (ref 0.0–4.0)

## 2014-08-26 LAB — URIC ACID: Uric Acid: 6.9 mg/dL (ref 3.7–8.6)

## 2014-08-30 ENCOUNTER — Other Ambulatory Visit: Payer: Medicare Other

## 2014-08-31 HISTORY — PX: CATARACT EXTRACTION: SUR2

## 2014-09-01 ENCOUNTER — Ambulatory Visit (INDEPENDENT_AMBULATORY_CARE_PROVIDER_SITE_OTHER): Payer: Medicare Other | Admitting: Internal Medicine

## 2014-09-01 ENCOUNTER — Encounter: Payer: Self-pay | Admitting: Internal Medicine

## 2014-09-01 VITALS — BP 138/88 | HR 80 | Temp 97.4°F | Resp 10 | Ht 70.0 in | Wt 165.0 lb

## 2014-09-01 DIAGNOSIS — R972 Elevated prostate specific antigen [PSA]: Secondary | ICD-10-CM

## 2014-09-01 DIAGNOSIS — E785 Hyperlipidemia, unspecified: Secondary | ICD-10-CM

## 2014-09-01 DIAGNOSIS — M48062 Spinal stenosis, lumbar region with neurogenic claudication: Secondary | ICD-10-CM

## 2014-09-01 DIAGNOSIS — M4806 Spinal stenosis, lumbar region: Secondary | ICD-10-CM

## 2014-09-01 DIAGNOSIS — G63 Polyneuropathy in diseases classified elsewhere: Secondary | ICD-10-CM

## 2014-09-01 MED ORDER — CLOPIDOGREL BISULFATE 75 MG PO TABS: ORAL_TABLET | ORAL | Status: DC

## 2014-09-01 NOTE — Progress Notes (Signed)
Patient ID: Drew Gibson, male   DOB: 1928-11-12, 80 y.o.   MRN: [id number]    Facility  [facility]    Place of Service:   OFFICE   Allergies  Allergen Reactions  . Aleve [Naproxen Sodium]     Upset stomach   . Nsaids     Bother his stomach  . Antihistamines, Diphenhydramine-Type     Causes urinary issues    Chief Complaint  Patient presents with  . Medical Management of Chronic Issues    4 month follow-up, discuss labs (copy printed)     HPI:  Polyneuropathy in other diseases classified elsewhere - stable  PSA elevation - chronically elevated  Spinal stenosis, lumbar region, with neurogenic claudication: mild back pains  Hyperlipidemia -chronically elevated LDL    Medications: Patient's Medications  New Prescriptions   No medications on file  Previous Medications   ACETAMINOPHEN (TYLENOL) 500 MG TABLET    Take 500-1,000 mg by mouth every 6 (six) hours as needed.   ALLOPURINOL (ZYLOPRIM) 100 MG TABLET    One twice daily to prevent gout   B COMPLEX VITAMINS CAPSULE    Take 1 capsule by mouth daily.    CHOLECALCIFEROL (VITAMIN D3) 2000 UNITS TABS    Take by mouth daily.   CLOPIDOGREL (PLAVIX) 75 MG TABLET    Take one tablet by mouth once daily   MULTIPLE VITAMINS-MINERALS (MULTIVITAMINS THER. W/MINERALS) TABS    Take 1 tablet by mouth daily.    OVER THE COUNTER MEDICATION    Apply 1 application topically at bedtime as needed (pain). Phys cream. For neuropathy pain   PRIMIDONE (MYSOLINE) 50 MG TABLET    TAKE ONE TABLET BY MOUTH IN THE EVENING TO  HELP  WITH  TREMOR  Modified Medications   No medications on file  Discontinued Medications   CHOLECALCIFEROL (VITAMIN D) 1000 UNITS TABLET    Take 2,000 Units by mouth daily.      Review of Systems  Constitutional: Positive for fatigue. Negative for fever.  HENT:       Light headed and dizzy feelings  Eyes: Positive for visual disturbance (corrective lenses).  Respiratory: Negative.   Cardiovascular: Negative for chest  pain, palpitations and leg swelling.  Gastrointestinal:       Episodes of aspiration have improved. Swallowing has been affected by his neuro muscular disease.  Endocrine: Negative.   Genitourinary: Positive for frequency and difficulty urinating.       Catheterized due to obstructive uropathy from BPH. Prior issues with nocturia and urgency.  Musculoskeletal:       S/P left hip fx  Skin: Negative.   Neurological:       Polyneuropathy. Bilateral foot drop.  Hematological:       Anemia.  Psychiatric/Behavioral: Negative.     Filed Vitals:   09/01/14 1422  BP: 138/88  Pulse: 80  Temp: 97.4 F (36.3 C)  TempSrc: Oral  Resp: 10  Height: 5\' 10"  (1.778 m)  Weight: 165 lb (74.844 kg)  SpO2: 97%   Body mass index is 23.68 kg/(m^2).  Physical Exam  Constitutional: Drew Gibson is oriented to person, place, and time.  Thin, frail   HENT:  Head: Normocephalic and atraumatic.  Right Ear: External ear normal.  Left Ear: External ear normal.  Nose: Nose normal.  Eyes: Conjunctivae and EOM are normal. Pupils are equal, round, and reactive to light.  Mmiild erythema of the right eye.  Neck: No JVD present. No tracheal deviation present. No thyromegaly present.  Cardiovascular: Normal rate, regular rhythm, normal heart sounds and intact distal pulses.  Exam reveals no gallop and no friction rub.   No murmur heard. Pulmonary/Chest: No respiratory distress. Drew Gibson has no wheezes. Drew Gibson has no rales. Drew Gibson exhibits no tenderness.  Abdominal: Drew Gibson exhibits no distension and no mass. There is no tenderness.  Musculoskeletal: Drew Gibson exhibits no edema or tenderness.  Prior left hip fx. Healed incision. Unstable gait. Using walker.Muscular wasting of hand muscles and forearm muscles and lower leg muscles. Contractures of all fingers in both hands.  Lymphadenopathy:    Drew Gibson has no cervical adenopathy.  Neurological: Drew Gibson is alert and oriented to person, place, and time. No cranial nerve deficit. Coordination abnormal.    Bilateral foot drop. Tremor. Generalized weakness based on neuromuscular disease.  Skin: No rash noted. No erythema. No pallor.  Psychiatric: Drew Gibson has a normal mood and affect. His behavior is normal. Judgment and thought content normal.     Labs reviewed: Appointment on 08/25/2014  Component Date Value Ref Range Status  . Glucose 08/25/2014 83  65 - 99 mg/dL Final  . BUN 08/25/2014 32* 8 - 27 mg/dL Final  . Creatinine, Ser 08/25/2014 1.45* 0.76 - 1.27 mg/dL Final  . GFR calc non Af Amer 08/25/2014 44* >59 mL/min/1.73 Final  . GFR calc Af Amer 08/25/2014 51* >59 mL/min/1.73 Final  . BUN/Creatinine Ratio 08/25/2014 22  10 - 22 Final  . Sodium 08/25/2014 143  134 - 144 mmol/L Final  . Potassium 08/25/2014 4.8  3.5 - 5.2 mmol/L Final  . Chloride 08/25/2014 103  97 - 108 mmol/L Final  . CO2 08/25/2014 26  18 - 29 mmol/L Final  . Calcium 08/25/2014 9.4  8.6 - 10.2 mg/dL Final  . Total Protein 08/25/2014 6.8  6.0 - 8.5 g/dL Final  . Albumin 08/25/2014 3.9  3.5 - 4.7 g/dL Final  . Globulin, Total 08/25/2014 2.9  1.5 - 4.5 g/dL Final  . Albumin/Globulin Ratio 08/25/2014 1.3  1.1 - 2.5 Final  . Total Bilirubin 08/25/2014 0.4  0.0 - 1.2 mg/dL Final  . Alkaline Phosphatase 08/25/2014 77  39 - 117 IU/L Final  . AST 08/25/2014 23  0 - 40 IU/L Final  . ALT 08/25/2014 23  0 - 44 IU/L Final  . WBC 08/25/2014 5.7  3.4 - 10.8 x10E3/uL Final  . RBC 08/25/2014 4.14  4.14 - 5.80 x10E6/uL Final  . Hemoglobin 08/25/2014 13.6  12.6 - 17.7 g/dL Final  . HCT 08/25/2014 40.5  37.5 - 51.0 % Final  . MCV 08/25/2014 98* 79 - 97 fL Final  . MCH 08/25/2014 32.9  26.6 - 33.0 pg Final  . MCHC 08/25/2014 33.6  31.5 - 35.7 g/dL Final  . RDW 08/25/2014 15.5* 12.3 - 15.4 % Final  . Platelets 08/25/2014 173  150 - 379 x10E3/uL Final  . Neutrophils Relative % 08/25/2014 45   Final  . Lymphs 08/25/2014 43   Final  . Monocytes 08/25/2014 7   Final  . Eos 08/25/2014 4   Final  . Basos 08/25/2014 1   Final  .  Neutrophils Absolute 08/25/2014 2.6  1.4 - 7.0 x10E3/uL Final  . Lymphocytes Absolute 08/25/2014 2.5  0.7 - 3.1 x10E3/uL Final  . Monocytes Absolute 08/25/2014 0.4  0.1 - 0.9 x10E3/uL Final  . Eosinophils Absolute 08/25/2014 0.2  0.0 - 0.4 x10E3/uL Final  . Basophils Absolute 08/25/2014 0.0  0.0 - 0.2 x10E3/uL Final  . Immature Granulocytes 08/25/2014 0   Final  .  Immature Grans (Abs) 08/25/2014 0.0  0.0 - 0.1 x10E3/uL Final  . Cholesterol, Total 08/25/2014 287* 100 - 199 mg/dL Final  . Triglycerides 08/25/2014 115  0 - 149 mg/dL Final  . HDL 08/25/2014 56  >39 mg/dL Final   Comment: According to ATP-III Guidelines, HDL-C >59 mg/dL is considered a negative risk factor for CHD.   Marland Kitchen VLDL Cholesterol Cal 08/25/2014 23  5 - 40 mg/dL Final  . LDL Calculated 08/25/2014 208* 0 - 99 mg/dL Final  . Comment: 08/25/2014 Comment   Final   Comment: Possible Familial Hypercholesterolemia. FH should be suspected when fasting LDL cholesterol is above 189 mg/dL or non-HDL cholesterol is above 219 mg/dL. A family history of high cholesterol and heart disease in 1st degree relatives should be collected. J Clin Lipidol 2011;5:133-140   . Chol/HDL Ratio 08/25/2014 5.1* 0.0 - 5.0 ratio units Final   Comment:                                   T. Chol/HDL Ratio                                             Men  Women                               1/2 Avg.Risk  3.4    3.3                                   Avg.Risk  5.0    4.4                                2X Avg.Risk  9.6    7.1                                3X Avg.Risk 23.4   11.0   . PSA 08/25/2014 7.3* 0.0 - 4.0 ng/mL Final   Comment: Roche ECLIA methodology. According to the American Urological Association, Serum PSA should decrease and remain at undetectable levels after radical prostatectomy. The AUA defines biochemical recurrence as an initial PSA value 0.2 ng/mL or greater followed by a subsequent confirmatory PSA value 0.2 ng/mL or  greater. Values obtained with different assay methods or kits cannot be used interchangeably. Results cannot be interpreted as absolute evidence of the presence or absence of malignant disease.   Marland Kitchen Uric Acid 08/25/2014 6.9  3.7 - 8.6 mg/dL Final              Therapeutic target for gout patients: <6.0     Assessment/Plan   1. Polyneuropathy in other diseases classified elsewhere stable - Basic metabolic panel; Future  2. PSA elevation - PSA; Future  3. Spinal stenosis, lumbar region, with neurogenic claudication stable  4. Hyperlipidemia I do not want to add Hmg Coa inhibitors due to risk of problems with muscles - Lipid panel; Future

## 2014-10-05 ENCOUNTER — Other Ambulatory Visit: Payer: Self-pay | Admitting: Nurse Practitioner

## 2014-10-05 ENCOUNTER — Other Ambulatory Visit: Payer: Self-pay | Admitting: Internal Medicine

## 2015-01-04 ENCOUNTER — Other Ambulatory Visit: Payer: Self-pay | Admitting: Internal Medicine

## 2015-02-15 ENCOUNTER — Telehealth: Payer: Self-pay

## 2015-02-15 NOTE — Telephone Encounter (Signed)
Dr. Nyoka Cowden received call from Mr. Bhupinder about problems with going to [facility].  Per Dr. Nyoka Cowden made appt for patient to come in Wednesday 02/16/15 at 1:30. Called patient, left message on voice mail his appt is 02/16/15 at 1:30.

## 2015-02-16 ENCOUNTER — Ambulatory Visit (INDEPENDENT_AMBULATORY_CARE_PROVIDER_SITE_OTHER): Payer: Medicare Other | Admitting: Internal Medicine

## 2015-02-16 ENCOUNTER — Encounter: Payer: Self-pay | Admitting: Internal Medicine

## 2015-02-16 VITALS — BP 158/90 | HR 65 | Temp 97.1°F | Ht 70.0 in | Wt 169.2 lb

## 2015-02-16 DIAGNOSIS — R195 Other fecal abnormalities: Secondary | ICD-10-CM

## 2015-02-16 NOTE — Patient Instructions (Signed)
Stop the imodium now. Call if problems persist.

## 2015-02-16 NOTE — Progress Notes (Signed)
Patient ID: Drew Gibson, male   DOB: Nov 11, 1928, 81 y.o.   MRN: [id number]    Facility  [facility]    Place of Service:   OFFICE    Allergies  Allergen Reactions  . Aleve [Naproxen Sodium]     Upset stomach   . Nsaids     Bother his stomach  . Antihistamines, Diphenhydramine-Type     Causes urinary issues    Chief Complaint  Patient presents with  . Acute Visit    Complains of problems going to the bathroom.    HPI:  Was having too many loose stools for the last 4 weeks. Initially thought due to Lamisil. Stopped that and the problem continues. Started probiotic and Imodium.  Yesterday, had a combination of lumpy stool and loose stool. No blood in stool. No fever or pain. No blood in stool.  Medications: Patient's Medications  New Prescriptions   No medications on file  Previous Medications   ACETAMINOPHEN (TYLENOL) 500 MG TABLET    Take 500-1,000 mg by mouth every 6 (six) hours as needed.   ALLOPURINOL (ZYLOPRIM) 100 MG TABLET    TAKE ONE TABLET BY MOUTH TWICE DAILY TO PREVENT GOUT   B COMPLEX VITAMINS CAPSULE    Take 1 capsule by mouth daily.    CHOLECALCIFEROL (VITAMIN D3) 2000 UNITS TABS    Take by mouth daily.   CLOPIDOGREL (PLAVIX) 75 MG TABLET    Take one tablet by mouth once daily for anticoagulation   MULTIPLE VITAMINS-MINERALS (MULTIVITAMINS THER. W/MINERALS) TABS    Take 1 tablet by mouth daily.    OVER THE COUNTER MEDICATION    Apply 1 application topically at bedtime as needed (pain). Phys cream. For neuropathy pain   PRIMIDONE (MYSOLINE) 50 MG TABLET    TAKE ONE TABLET BY MOUTH IN THE EVENING TO  HELP  WITH  TREMOR   PROBIOTIC PRODUCT (PROBIOTIC DAILY PO)    Take by mouth. Take one tablet by mouth once daily as needed for stomach  Modified Medications   No medications on file  Discontinued Medications   No medications on file     Review of Systems  Constitutional: Positive for fatigue. Negative for fever.  HENT:       Light headed and dizzy feelings  Eyes:  Positive for visual disturbance (corrective lenses).  Respiratory: Negative.   Cardiovascular: Negative for chest pain, palpitations and leg swelling.  Gastrointestinal:       Episodes of aspiration have improved. Swallowing has been affected by his neuro muscular disease. Recent problems with loose stools.  Endocrine: Negative.   Genitourinary: Positive for frequency and difficulty urinating.       Catheterized due to obstructive uropathy from BPH. Prior issues with nocturia and urgency.  Musculoskeletal:       S/P left hip fx  Skin: Negative.   Neurological:       Polyneuropathy. Bilateral foot drop.  Hematological:       Anemia.  Psychiatric/Behavioral: Negative.     Filed Vitals:   02/16/15 1333  BP: 158/90  Pulse: 65  Temp: 97.1 F (36.2 C)  TempSrc: Oral  Height: 5\' 10"  (1.778 m)  Weight: 169 lb 3.2 oz (76.749 kg)   Body mass index is 24.28 kg/(m^2).  Physical Exam  Constitutional: He is oriented to person, place, and time.  Thin, frail   HENT:  Head: Normocephalic and atraumatic.  Right Ear: External ear normal.  Left Ear: External ear normal.  Nose: Nose normal.  Eyes: Conjunctivae and EOM are normal. Pupils are equal, round, and reactive to light.  Mmiild erythema of the right eye.  Neck: No JVD present. No tracheal deviation present. No thyromegaly present.  Cardiovascular: Normal rate, regular rhythm, normal heart sounds and intact distal pulses.  Exam reveals no gallop and no friction rub.   No murmur heard. Pulmonary/Chest: No respiratory distress. He has no wheezes. He has no rales. He exhibits no tenderness.  Abdominal: He exhibits no distension and no mass. There is no tenderness.  Genitourinary: Rectum normal. Guaiac negative stool.  Formed stool in rectum. No obstruction.  Musculoskeletal: He exhibits no edema or tenderness.  Prior left hip fx. Healed incision. Unstable gait. Using walker.Muscular wasting of hand muscles and forearm muscles and  lower leg muscles. Contractures of all fingers in both hands.  Lymphadenopathy:    He has no cervical adenopathy.  Neurological: He is alert and oriented to person, place, and time. No cranial nerve deficit. Coordination abnormal.  Bilateral foot drop. Tremor. Generalized weakness based on neuromuscular disease.  Skin: No rash noted. No erythema. No pallor.  Psychiatric: He has a normal mood and affect. His behavior is normal. Judgment and thought content normal.     Labs reviewed: No visits with results within 3 Month(s) from this visit. Latest known visit with results is:  Appointment on 08/25/2014  Component Date Value Ref Range Status  . Glucose 08/25/2014 83  65 - 99 mg/dL Final  . BUN 08/25/2014 32* 8 - 27 mg/dL Final  . Creatinine, Ser 08/25/2014 1.45* 0.76 - 1.27 mg/dL Final  . GFR calc non Af Amer 08/25/2014 44* >59 mL/min/1.73 Final  . GFR calc Af Amer 08/25/2014 51* >59 mL/min/1.73 Final  . BUN/Creatinine Ratio 08/25/2014 22  10 - 22 Final  . Sodium 08/25/2014 143  134 - 144 mmol/L Final  . Potassium 08/25/2014 4.8  3.5 - 5.2 mmol/L Final  . Chloride 08/25/2014 103  97 - 108 mmol/L Final  . CO2 08/25/2014 26  18 - 29 mmol/L Final  . Calcium 08/25/2014 9.4  8.6 - 10.2 mg/dL Final  . Total Protein 08/25/2014 6.8  6.0 - 8.5 g/dL Final  . Albumin 08/25/2014 3.9  3.5 - 4.7 g/dL Final  . Globulin, Total 08/25/2014 2.9  1.5 - 4.5 g/dL Final  . Albumin/Globulin Ratio 08/25/2014 1.3  1.1 - 2.5 Final  . Total Bilirubin 08/25/2014 0.4  0.0 - 1.2 mg/dL Final  . Alkaline Phosphatase 08/25/2014 77  39 - 117 IU/L Final  . AST 08/25/2014 23  0 - 40 IU/L Final  . ALT 08/25/2014 23  0 - 44 IU/L Final  . WBC 08/25/2014 5.7  3.4 - 10.8 x10E3/uL Final  . RBC 08/25/2014 4.14  4.14 - 5.80 x10E6/uL Final  . Hemoglobin 08/25/2014 13.6  12.6 - 17.7 g/dL Final  . HCT 08/25/2014 40.5  37.5 - 51.0 % Final  . MCV 08/25/2014 98* 79 - 97 fL Final  . MCH 08/25/2014 32.9  26.6 - 33.0 pg Final  .  MCHC 08/25/2014 33.6  31.5 - 35.7 g/dL Final  . RDW 08/25/2014 15.5* 12.3 - 15.4 % Final  . Platelets 08/25/2014 173  150 - 379 x10E3/uL Final  . Neutrophils Relative % 08/25/2014 45   Final  . Lymphs 08/25/2014 43   Final  . Monocytes 08/25/2014 7   Final  . Eos 08/25/2014 4   Final  . Basos 08/25/2014 1   Final  . Neutrophils Absolute 08/25/2014 2.6  1.4 - 7.0 x10E3/uL Final  . Lymphocytes Absolute 08/25/2014 2.5  0.7 - 3.1 x10E3/uL Final  . Monocytes Absolute 08/25/2014 0.4  0.1 - 0.9 x10E3/uL Final  . Eosinophils Absolute 08/25/2014 0.2  0.0 - 0.4 x10E3/uL Final  . Basophils Absolute 08/25/2014 0.0  0.0 - 0.2 x10E3/uL Final  . Immature Granulocytes 08/25/2014 0   Final  . Immature Grans (Abs) 08/25/2014 0.0  0.0 - 0.1 x10E3/uL Final  . Cholesterol, Total 08/25/2014 287* 100 - 199 mg/dL Final  . Triglycerides 08/25/2014 115  0 - 149 mg/dL Final  . HDL 08/25/2014 56  >39 mg/dL Final   Comment: According to ATP-III Guidelines, HDL-C >59 mg/dL is considered a negative risk factor for CHD.   Marland Kitchen VLDL Cholesterol Cal 08/25/2014 23  5 - 40 mg/dL Final  . LDL Calculated 08/25/2014 208* 0 - 99 mg/dL Final  . Comment: 08/25/2014 Comment   Final   Comment: Possible Familial Hypercholesterolemia. FH should be suspected when fasting LDL cholesterol is above 189 mg/dL or non-HDL cholesterol is above 219 mg/dL. A family history of high cholesterol and heart disease in 1st degree relatives should be collected. J Clin Lipidol 2011;5:133-140   . Chol/HDL Ratio 08/25/2014 5.1* 0.0 - 5.0 ratio units Final   Comment:                                   T. Chol/HDL Ratio                                             Men  Women                               1/2 Avg.Risk  3.4    3.3                                   Avg.Risk  5.0    4.4                                2X Avg.Risk  9.6    7.1                                3X Avg.Risk 23.4   11.0   . PSA 08/25/2014 7.3* 0.0 - 4.0 ng/mL Final   Comment:  Roche ECLIA methodology. According to the American Urological Association, Serum PSA should decrease and remain at undetectable levels after radical prostatectomy. The AUA defines biochemical recurrence as an initial PSA value 0.2 ng/mL or greater followed by a subsequent confirmatory PSA value 0.2 ng/mL or greater. Values obtained with different assay methods or kits cannot be used interchangeably. Results cannot be interpreted as absolute evidence of the presence or absence of malignant disease.   Marland Kitchen Uric Acid 08/25/2014 6.9  3.7 - 8.6 mg/dL Final              Therapeutic target for gout patients: <6.0     Assessment/Plan  1. Loose stools improved

## 2015-02-21 ENCOUNTER — Telehealth: Payer: Self-pay | Admitting: *Deleted

## 2015-02-21 DIAGNOSIS — R197 Diarrhea, unspecified: Secondary | ICD-10-CM

## 2015-02-21 NOTE — Telephone Encounter (Signed)
Patient called Dr. Nyoka Cowden this weekend and stated that his Diarrhea came back. Dr. Nyoka Cowden wants labs and stool culture on patient: CMP,CBC,stool culture,stool O&P, Stool c-diff toxin assay and wants patient to resume Imodium 2mg  twice daily. Patient notified and agreed. Will come in in the morning to have this done. Scheduled and orders placed.

## 2015-02-22 ENCOUNTER — Other Ambulatory Visit: Payer: Medicare Other

## 2015-02-22 DIAGNOSIS — R197 Diarrhea, unspecified: Secondary | ICD-10-CM

## 2015-02-22 DIAGNOSIS — R972 Elevated prostate specific antigen [PSA]: Secondary | ICD-10-CM

## 2015-02-22 DIAGNOSIS — E785 Hyperlipidemia, unspecified: Secondary | ICD-10-CM

## 2015-02-23 ENCOUNTER — Other Ambulatory Visit: Payer: Medicare Other

## 2015-02-23 LAB — CBC WITH DIFFERENTIAL/PLATELET
Basophils Absolute: 0 10*3/uL (ref 0.0–0.2)
Basos: 0 %
EOS (ABSOLUTE): 0.2 10*3/uL (ref 0.0–0.4)
EOS: 3 %
Hematocrit: 38.9 % (ref 37.5–51.0)
Hemoglobin: 12.9 g/dL (ref 12.6–17.7)
IMMATURE GRANS (ABS): 0 10*3/uL (ref 0.0–0.1)
IMMATURE GRANULOCYTES: 0 %
LYMPHS ABS: 2.4 10*3/uL (ref 0.7–3.1)
LYMPHS: 39 %
MCH: 32.6 pg (ref 26.6–33.0)
MCHC: 33.2 g/dL (ref 31.5–35.7)
MCV: 98 fL — AB (ref 79–97)
MONOCYTES: 7 %
Monocytes Absolute: 0.4 10*3/uL (ref 0.1–0.9)
NEUTROS PCT: 51 %
Neutrophils Absolute: 3.2 10*3/uL (ref 1.4–7.0)
Platelets: 175 10*3/uL (ref 150–379)
RBC: 3.96 x10E6/uL — ABNORMAL LOW (ref 4.14–5.80)
RDW: 15.4 % (ref 12.3–15.4)
WBC: 6.3 10*3/uL (ref 3.4–10.8)

## 2015-02-23 LAB — COMPREHENSIVE METABOLIC PANEL
A/G RATIO: 1.5 (ref 1.1–2.5)
ALBUMIN: 3.8 g/dL (ref 3.5–4.7)
ALK PHOS: 70 IU/L (ref 39–117)
ALT: 13 IU/L (ref 0–44)
AST: 15 IU/L (ref 0–40)
BUN/Creatinine Ratio: 24 — ABNORMAL HIGH (ref 10–22)
BUN: 36 mg/dL — AB (ref 8–27)
Bilirubin Total: 0.3 mg/dL (ref 0.0–1.2)
CO2: 21 mmol/L (ref 18–29)
CREATININE: 1.47 mg/dL — AB (ref 0.76–1.27)
Calcium: 9.2 mg/dL (ref 8.6–10.2)
Chloride: 102 mmol/L (ref 97–108)
GFR calc Af Amer: 50 mL/min/{1.73_m2} — ABNORMAL LOW (ref >59)
GFR calc non Af Amer: 43 mL/min/{1.73_m2} — ABNORMAL LOW (ref >59)
GLOBULIN, TOTAL: 2.6 g/dL (ref 1.5–4.5)
Glucose: 84 mg/dL (ref 65–99)
POTASSIUM: 4.7 mmol/L (ref 3.5–5.2)
Sodium: 137 mmol/L (ref 134–144)
TOTAL PROTEIN: 6.4 g/dL (ref 6.0–8.5)

## 2015-02-23 LAB — LIPID PANEL
CHOLESTEROL TOTAL: 279 mg/dL — AB (ref 100–199)
Chol/HDL Ratio: 4.7 ratio units (ref 0.0–5.0)
HDL: 60 mg/dL (ref >39)
LDL Calculated: 199 mg/dL — ABNORMAL HIGH (ref 0–99)
TRIGLYCERIDES: 99 mg/dL (ref 0–149)
VLDL CHOLESTEROL CAL: 20 mg/dL (ref 5–40)

## 2015-02-23 LAB — PSA: Prostate Specific Ag, Serum: 7.2 ng/mL — ABNORMAL HIGH (ref 0.0–4.0)

## 2015-02-25 LAB — CDIFF NAA+O+P+STOOL CULTURE: CDIFFPCR: NEGATIVE

## 2015-03-01 NOTE — Progress Notes (Signed)
Cancel lab orders for 03/25/15

## 2015-03-09 ENCOUNTER — Ambulatory Visit (INDEPENDENT_AMBULATORY_CARE_PROVIDER_SITE_OTHER): Payer: Medicare Other | Admitting: Internal Medicine

## 2015-03-09 ENCOUNTER — Encounter: Payer: Self-pay | Admitting: Internal Medicine

## 2015-03-09 VITALS — BP 130/72 | HR 76 | Temp 97.4°F | Ht 70.0 in | Wt 167.0 lb

## 2015-03-09 DIAGNOSIS — R195 Other fecal abnormalities: Secondary | ICD-10-CM

## 2015-03-09 NOTE — Progress Notes (Signed)
Patient ID: Drew Gibson, male   DOB: 1929-02-24, 81 y.o.   MRN: [id number]    Facility  [facility]    Place of Service:   OFFICE    Allergies  Allergen Reactions  . Aleve [Naproxen Sodium]     Upset stomach   . Nsaids     Bother his stomach  . Antihistamines, Diphenhydramine-Type     Causes urinary issues    Chief Complaint  Patient presents with  . Acute Visit    Ongoing loose stools, no better. Patient is taking 1 Imodium daily     HPI:  Loose stools continue, 1-2 episodes daily, ongoing x 7 weeks now. has been able to reduce to once daily dosing of Imodium. If does not take stools are watery. Increased gas, only 2 episodes of abdominal cramping. Denies blood in stools. No weight loss. Tried probiotics x 2 weeks, stopped because felt was worsening diarrhea. Has tried adding yogurt to diet as well, worsened symptoms. Stool cultures were negative. Last Colonoscopy and barium enema in 1997.  Medications: Patient's Medications  New Prescriptions   No medications on file  Previous Medications   ACETAMINOPHEN (TYLENOL) 500 MG TABLET    Take 500-1,000 mg by mouth every 6 (six) hours as needed.   ALLOPURINOL (ZYLOPRIM) 100 MG TABLET    TAKE ONE TABLET BY MOUTH TWICE DAILY TO PREVENT GOUT   B COMPLEX VITAMINS CAPSULE    Take 1 capsule by mouth daily.    CHOLECALCIFEROL (VITAMIN D3) 2000 UNITS TABS    Take by mouth daily.   CLOPIDOGREL (PLAVIX) 75 MG TABLET    Take one tablet by mouth once daily for anticoagulation   LOPERAMIDE HCL (IMODIUM PO)    Take by mouth daily.   MULTIPLE VITAMINS-MINERALS (MULTIVITAMINS THER. W/MINERALS) TABS    Take 1 tablet by mouth daily.    OVER THE COUNTER MEDICATION    Apply 1 application topically at bedtime as needed (pain). Phys cream. For neuropathy pain   PRIMIDONE (MYSOLINE) 50 MG TABLET    TAKE ONE TABLET BY MOUTH IN THE EVENING TO  HELP  WITH  TREMOR   PROBIOTIC PRODUCT (PROBIOTIC DAILY PO)    Take by mouth. Take one tablet by mouth once daily as  needed for stomach  Modified Medications   No medications on file  Discontinued Medications   No medications on file     Review of Systems  Constitutional: Positive for fatigue. Negative for fever.  HENT:       Light headed and dizzy feelings  Eyes: Positive for visual disturbance (corrective lenses).  Respiratory: Negative.   Cardiovascular: Negative for chest pain, palpitations and leg swelling.  Gastrointestinal: Positive for diarrhea. Negative for abdominal pain and blood in stool.       Episodes of aspiration have improved. Swallowing has been affected by his neuro muscular disease. Recent problems with loose stools.  Endocrine: Negative.   Genitourinary: Positive for frequency and difficulty urinating.       Catheterized due to obstructive uropathy from BPH. Prior issues with nocturia and urgency.  Musculoskeletal:       S/P left hip fx  Skin: Negative.   Neurological:       Polyneuropathy. Bilateral foot drop.  Hematological:       Anemia.  Psychiatric/Behavioral: Negative.     Filed Vitals:   03/09/15 1326  BP: 130/72  Pulse: 76  Temp: 97.4 F (36.3 C)  TempSrc: Oral  Height: 5\' 10"  (1.778 m)  Weight: 167 lb (75.751 kg)  SpO2: 96%   Body mass index is 23.96 kg/(m^2).  Physical Exam  Constitutional: He is oriented to person, place, and time.  Thin, frail   HENT:  Head: Normocephalic and atraumatic.  Right Ear: External ear normal.  Left Ear: External ear normal.  Nose: Nose normal.  Eyes: Conjunctivae and EOM are normal.  Mild erythema of the right eye.  Neck: No JVD present. No tracheal deviation present. No thyromegaly present.  Cardiovascular: Normal rate, regular rhythm, normal heart sounds and intact distal pulses.  Exam reveals no gallop and no friction rub.   No murmur heard. Pulmonary/Chest: No respiratory distress. He has no wheezes. He has no rales. He exhibits no tenderness.  Abdominal: He exhibits no distension and no mass. There is no  tenderness.  Genitourinary: Rectum normal. Guaiac negative stool.  Formed stool in rectum. No obstruction.  Musculoskeletal: He exhibits no edema or tenderness.  Prior left hip fx. Healed incision. Unstable gait. Using walker.Muscular wasting of hand muscles and forearm muscles and lower leg muscles. Contractures of all fingers in both hands.  Lymphadenopathy:    He has no cervical adenopathy.  Neurological: He is alert and oriented to person, place, and time. No cranial nerve deficit. Coordination abnormal.  Bilateral foot drop. Tremor. Generalized weakness based on neuromuscular disease.  Skin: No rash noted. No erythema. No pallor.  Psychiatric: He has a normal mood and affect. His behavior is normal. Judgment and thought content normal.     Labs reviewed: Appointment on 02/22/2015  Component Date Value Ref Range Status  . Cholesterol, Total 02/22/2015 279* 100 - 199 mg/dL Final  . Triglycerides 02/22/2015 99  0 - 149 mg/dL Final  . HDL 02/22/2015 60  >39 mg/dL Final   Comment: According to ATP-III Guidelines, HDL-C >59 mg/dL is considered a negative risk factor for CHD.   Marland Kitchen VLDL Cholesterol Cal 02/22/2015 20  5 - 40 mg/dL Final  . LDL Calculated 02/22/2015 199* 0 - 99 mg/dL Final  . Comment: 02/22/2015 Comment   Final   Comment: Possible Familial Hypercholesterolemia. FH should be suspected when fasting LDL cholesterol is above 189 mg/dL or non-HDL cholesterol is above 219 mg/dL. A family history of high cholesterol and heart disease in 1st degree relatives should be collected. J Clin Lipidol 2011;5:133-140   . Chol/HDL Ratio 02/22/2015 4.7  0.0 - 5.0 ratio units Final   Comment:                                   T. Chol/HDL Ratio                                             Men  Women                               1/2 Avg.Risk  3.4    3.3                                   Avg.Risk  5.0    4.4  2X Avg.Risk  9.6    7.1                                 3X Avg.Risk 23.4   11.0   . Prostate Specific Ag, Serum 02/22/2015 7.2* 0.0 - 4.0 ng/mL Final   Comment: Roche ECLIA methodology. According to the American Urological Association, Serum PSA should decrease and remain at undetectable levels after radical prostatectomy. The AUA defines biochemical recurrence as an initial PSA value 0.2 ng/mL or greater followed by a subsequent confirmatory PSA value 0.2 ng/mL or greater. Values obtained with different assay methods or kits cannot be used interchangeably. Results cannot be interpreted as absolute evidence of the presence or absence of malignant disease.   . Glucose 02/22/2015 84  65 - 99 mg/dL Final  . BUN 02/22/2015 36* 8 - 27 mg/dL Final  . Creatinine, Ser 02/22/2015 1.47* 0.76 - 1.27 mg/dL Final  . GFR calc non Af Amer 02/22/2015 43* >59 mL/min/1.73 Final  . GFR calc Af Amer 02/22/2015 50* >59 mL/min/1.73 Final  . BUN/Creatinine Ratio 02/22/2015 24* 10 - 22 Final  . Sodium 02/22/2015 137  134 - 144 mmol/L Final  . Potassium 02/22/2015 4.7  3.5 - 5.2 mmol/L Final  . Chloride 02/22/2015 102  97 - 108 mmol/L Final  . CO2 02/22/2015 21  18 - 29 mmol/L Final  . Calcium 02/22/2015 9.2  8.6 - 10.2 mg/dL Final  . Total Protein 02/22/2015 6.4  6.0 - 8.5 g/dL Final  . Albumin 02/22/2015 3.8  3.5 - 4.7 g/dL Final  . Globulin, Total 02/22/2015 2.6  1.5 - 4.5 g/dL Final  . Albumin/Globulin Ratio 02/22/2015 1.5  1.1 - 2.5 Final  . Bilirubin Total 02/22/2015 0.3  0.0 - 1.2 mg/dL Final  . Alkaline Phosphatase 02/22/2015 70  39 - 117 IU/L Final  . AST 02/22/2015 15  0 - 40 IU/L Final  . ALT 02/22/2015 13  0 - 44 IU/L Final  . WBC 02/22/2015 6.3  3.4 - 10.8 x10E3/uL Final  . RBC 02/22/2015 3.96* 4.14 - 5.80 x10E6/uL Final  . Hemoglobin 02/22/2015 12.9  12.6 - 17.7 g/dL Final  . Hematocrit 02/22/2015 38.9  37.5 - 51.0 % Final  . MCV 02/22/2015 98* 79 - 97 fL Final  . MCH 02/22/2015 32.6  26.6 - 33.0 pg Final  . MCHC 02/22/2015 33.2  31.5 -  35.7 g/dL Final  . RDW 02/22/2015 15.4  12.3 - 15.4 % Final  . Platelets 02/22/2015 175  150 - 379 x10E3/uL Final  . NEUTROPHILS 02/22/2015 51   Final  . Lymphs 02/22/2015 39   Final  . Monocytes 02/22/2015 7   Final  . Eos 02/22/2015 3   Final  . Basos 02/22/2015 0   Final  . Neutrophils Absolute 02/22/2015 3.2  1.4 - 7.0 x10E3/uL Final  . Lymphocytes Absolute 02/22/2015 2.4  0.7 - 3.1 x10E3/uL Final  . Monocytes Absolute 02/22/2015 0.4  0.1 - 0.9 x10E3/uL Final  . EOS (ABSOLUTE) 02/22/2015 0.2  0.0 - 0.4 x10E3/uL Final  . Basophils Absolute 02/22/2015 0.0  0.0 - 0.2 x10E3/uL Final  . Immature Granulocytes 02/22/2015 0   Final  . Immature Grans (Abs) 02/22/2015 0.0  0.0 - 0.1 x10E3/uL Final  . Salmonella/Shigella Screen 02/23/2015 CANCELED   Final-Edited   Comment: No Ova / Parasite transport container received.  Result canceled by the ancillary   . Campylobacter Culture 02/23/2015  CANCELED   Final-Edited   Comment: No Ova / Parasite transport container received.  Result canceled by the ancillary   . E coli, Shiga toxin Assay 02/23/2015 CANCELED   Final-Edited   Comment: No Ova / Parasite transport container received.  Result canceled by the ancillary   . Ova + Parasite Exam 02/23/2015 CANCELED   Final-Edited   Comment: No Ova / Parasite transport container received. These results were obtained using wet preparation(s) and trichrome stained smear. This test does not include testing for Cryptosporidium parvum, Cyclospora, or Microsporidia.  Result canceled by the ancillary   . C difficile by pcr 02/23/2015 Negative  Negative Final     Assessment/Plan  1. Loose stools Continue Imodium once daily  - Ambulatory referral to Gastroenterology for colonoscopy

## 2015-03-10 ENCOUNTER — Encounter: Payer: Self-pay | Admitting: Gastroenterology

## 2015-03-25 ENCOUNTER — Other Ambulatory Visit: Payer: Medicare Other

## 2015-03-29 ENCOUNTER — Encounter: Payer: Self-pay | Admitting: Internal Medicine

## 2015-03-29 ENCOUNTER — Ambulatory Visit (INDEPENDENT_AMBULATORY_CARE_PROVIDER_SITE_OTHER): Payer: Medicare Other | Admitting: Internal Medicine

## 2015-03-29 VITALS — BP 128/70 | HR 75 | Temp 97.6°F | Resp 20 | Ht 70.0 in | Wt 166.2 lb

## 2015-03-29 DIAGNOSIS — R269 Unspecified abnormalities of gait and mobility: Secondary | ICD-10-CM

## 2015-03-29 DIAGNOSIS — E785 Hyperlipidemia, unspecified: Secondary | ICD-10-CM | POA: Diagnosis not present

## 2015-03-29 DIAGNOSIS — R195 Other fecal abnormalities: Secondary | ICD-10-CM

## 2015-03-29 DIAGNOSIS — N183 Chronic kidney disease, stage 3 unspecified: Secondary | ICD-10-CM

## 2015-03-29 NOTE — Progress Notes (Signed)
Patient ID: Drew Gibson, male   DOB: 10/24/1928, 81 y.o.   MRN: [id number]    Facility  [facility]    Place of Service:   OFFICE    Allergies  Allergen Reactions  . Aleve [Naproxen Sodium]     Upset stomach   . Nsaids     Bother his stomach  . Antihistamines, Diphenhydramine-Type     Causes urinary issues    Chief Complaint  Patient presents with  . Medical Management of Chronic Issues    HPI:  Loose stools controlled by taking Imodium one each morning. No diarrhea. One day he had pain in the abd, but it has resolved. The vitamins he used were recalled. [employer] we had received a preliminary report all his stool cultures were negative. These were submitted 03/01/2015. On review of the final culture reports today, it appears that several were not done because there were "canceled by ancillary". Our laboratory take is really not familiar with what this means but will check with [facility] regarding whether these tests were completed. Patient "in the meanwhile, will contact [employer] about possible contamination on the specific lots of vitamins that he was taking.  He denies any abdominal pain, cramping, or blood in stool. There have been no fevers. He denies nausea.  He is otherwise much the same as he has been in the past. He has his neuromuscular degenerative problems. Gait is about the same as it was 6 months ago. There have been a couple falls, but they were not injurious.  Medications: Patient's Medications  New Prescriptions   No medications on file  Previous Medications   ACETAMINOPHEN (TYLENOL) 500 MG TABLET    Take 500-1,000 mg by mouth every 6 (six) hours as needed.   ALLOPURINOL (ZYLOPRIM) 100 MG TABLET    TAKE ONE TABLET BY MOUTH TWICE DAILY TO PREVENT GOUT   B COMPLEX VITAMINS CAPSULE    Take 1 capsule by mouth daily.    CHOLECALCIFEROL (VITAMIN D3) 2000 UNITS TABS    Take by mouth daily.   CLOPIDOGREL (PLAVIX) 75 MG TABLET    Take one tablet by mouth once  daily for anticoagulation   LOPERAMIDE HCL (IMODIUM PO)    Take 1 capsule by mouth daily.    MULTIPLE VITAMINS-MINERALS (MULTIVITAMINS THER. W/MINERALS) TABS    Take 1 tablet by mouth daily.    OVER THE COUNTER MEDICATION    Apply 1 application topically at bedtime as needed (pain). Phys cream. For neuropathy pain   PRIMIDONE (MYSOLINE) 50 MG TABLET    TAKE ONE TABLET BY MOUTH IN THE EVENING TO  HELP  WITH  TREMOR  Modified Medications   No medications on file  Discontinued Medications   PROBIOTIC PRODUCT (PROBIOTIC DAILY PO)    Take by mouth. Take one tablet by mouth once daily as needed for stomach     Review of Systems  Constitutional: Positive for fatigue. Negative for fever.  HENT:       Light headed and dizzy feelings  Eyes: Positive for visual disturbance (corrective lenses).  Respiratory: Negative.   Cardiovascular: Negative for chest pain, palpitations and leg swelling.  Gastrointestinal: Positive for diarrhea. Negative for abdominal pain and blood in stool.       Episodes of aspiration have improved. Swallowing has been affected by his neuro muscular disease. Recent problems with loose stools.  Endocrine: Negative.   Genitourinary: Positive for frequency and difficulty urinating.       Catheterized  due to obstructive uropathy from BPH. Prior issues with nocturia and urgency.  Musculoskeletal:       S/P left hip fx  Skin: Negative.   Neurological:       Polyneuropathy. Bilateral foot drop.  Hematological:       Anemia.  Psychiatric/Behavioral: Negative.     Filed Vitals:   03/29/15 1321  BP: 128/70  Pulse: 75  Temp: 97.6 F (36.4 C)  TempSrc: Oral  Resp: 20  Height: 5\' 10"  (1.778 m)  Weight: 166 lb 3.2 oz (75.388 kg)  SpO2: 95%   Body mass index is 23.85 kg/(m^2).  Physical Exam  Constitutional: He is oriented to person, place, and time.  Thin, frail   HENT:  Head: Normocephalic and atraumatic.  Right Ear: External ear normal.  Left Ear: External ear  normal.  Nose: Nose normal.  Eyes: Conjunctivae and EOM are normal.  Mild erythema of the right eye.  Neck: No JVD present. No tracheal deviation present. No thyromegaly present.  Cardiovascular: Normal rate, regular rhythm, normal heart sounds and intact distal pulses.  Exam reveals no gallop and no friction rub.   No murmur heard. Pulmonary/Chest: No respiratory distress. He has no wheezes. He has no rales. He exhibits no tenderness.  Abdominal: He exhibits no distension and no mass. There is no tenderness.  Genitourinary: Rectum normal. Guaiac negative stool.  Formed stool in rectum. No obstruction.  Musculoskeletal: He exhibits no edema or tenderness.  Prior left hip fx. Healed incision. Unstable gait. Using walker.Muscular wasting of hand muscles and forearm muscles and lower leg muscles. Contractures of all fingers in both hands.  Lymphadenopathy:    He has no cervical adenopathy.  Neurological: He is alert and oriented to person, place, and time. No cranial nerve deficit. Coordination abnormal.  Bilateral foot drop. Tremor. Generalized weakness based on neuromuscular disease.  Skin: No rash noted. No erythema. No pallor.  Psychiatric: He has a normal mood and affect. His behavior is normal. Judgment and thought content normal.     Labs reviewed: Appointment on 02/22/2015  Component Date Value Ref Range Status  . Cholesterol, Total 02/22/2015 279* 100 - 199 mg/dL Final  . Triglycerides 02/22/2015 99  0 - 149 mg/dL Final  . HDL 02/22/2015 60  >39 mg/dL Final   Comment: According to ATP-III Guidelines, HDL-C >59 mg/dL is considered a negative risk factor for CHD.   Marland Kitchen VLDL Cholesterol Cal 02/22/2015 20  5 - 40 mg/dL Final  . LDL Calculated 02/22/2015 199* 0 - 99 mg/dL Final  . Comment: 02/22/2015 Comment   Final   Comment: Possible Familial Hypercholesterolemia. FH should be suspected when fasting LDL cholesterol is above 189 mg/dL or non-HDL cholesterol is above 219 mg/dL. A  family history of high cholesterol and heart disease in 1st degree relatives should be collected. J Clin Lipidol 2011;5:133-140   . Chol/HDL Ratio 02/22/2015 4.7  0.0 - 5.0 ratio units Final   Comment:                                   T. Chol/HDL Ratio                                             Men  Women  1/2 Avg.Risk  3.4    3.3                                   Avg.Risk  5.0    4.4                                2X Avg.Risk  9.6    7.1                                3X Avg.Risk 23.4   11.0   . Prostate Specific Ag, Serum 02/22/2015 7.2* 0.0 - 4.0 ng/mL Final   Comment: Roche ECLIA methodology. According to the American Urological Association, Serum PSA should decrease and remain at undetectable levels after radical prostatectomy. The AUA defines biochemical recurrence as an initial PSA value 0.2 ng/mL or greater followed by a subsequent confirmatory PSA value 0.2 ng/mL or greater. Values obtained with different assay methods or kits cannot be used interchangeably. Results cannot be interpreted as absolute evidence of the presence or absence of malignant disease.   . Glucose 02/22/2015 84  65 - 99 mg/dL Final  . BUN 02/22/2015 36* 8 - 27 mg/dL Final  . Creatinine, Ser 02/22/2015 1.47* 0.76 - 1.27 mg/dL Final  . GFR calc non Af Amer 02/22/2015 43* >59 mL/min/1.73 Final  . GFR calc Af Amer 02/22/2015 50* >59 mL/min/1.73 Final  . BUN/Creatinine Ratio 02/22/2015 24* 10 - 22 Final  . Sodium 02/22/2015 137  134 - 144 mmol/L Final  . Potassium 02/22/2015 4.7  3.5 - 5.2 mmol/L Final  . Chloride 02/22/2015 102  97 - 108 mmol/L Final  . CO2 02/22/2015 21  18 - 29 mmol/L Final  . Calcium 02/22/2015 9.2  8.6 - 10.2 mg/dL Final  . Total Protein 02/22/2015 6.4  6.0 - 8.5 g/dL Final  . Albumin 02/22/2015 3.8  3.5 - 4.7 g/dL Final  . Globulin, Total 02/22/2015 2.6  1.5 - 4.5 g/dL Final  . Albumin/Globulin Ratio 02/22/2015 1.5  1.1 - 2.5 Final  . Bilirubin Total  02/22/2015 0.3  0.0 - 1.2 mg/dL Final  . Alkaline Phosphatase 02/22/2015 70  39 - 117 IU/L Final  . AST 02/22/2015 15  0 - 40 IU/L Final  . ALT 02/22/2015 13  0 - 44 IU/L Final  . WBC 02/22/2015 6.3  3.4 - 10.8 x10E3/uL Final  . RBC 02/22/2015 3.96* 4.14 - 5.80 x10E6/uL Final  . Hemoglobin 02/22/2015 12.9  12.6 - 17.7 g/dL Final  . Hematocrit 02/22/2015 38.9  37.5 - 51.0 % Final  . MCV 02/22/2015 98* 79 - 97 fL Final  . MCH 02/22/2015 32.6  26.6 - 33.0 pg Final  . MCHC 02/22/2015 33.2  31.5 - 35.7 g/dL Final  . RDW 02/22/2015 15.4  12.3 - 15.4 % Final  . Platelets 02/22/2015 175  150 - 379 x10E3/uL Final  . NEUTROPHILS 02/22/2015 51   Final  . Lymphs 02/22/2015 39   Final  . Monocytes 02/22/2015 7   Final  . Eos 02/22/2015 3   Final  . Basos 02/22/2015 0   Final  . Neutrophils Absolute 02/22/2015 3.2  1.4 - 7.0 x10E3/uL Final  . Lymphocytes Absolute 02/22/2015 2.4  0.7 - 3.1 x10E3/uL Final  . Monocytes Absolute 02/22/2015 0.4  0.1 - 0.9  x10E3/uL Final  . EOS (ABSOLUTE) 02/22/2015 0.2  0.0 - 0.4 x10E3/uL Final  . Basophils Absolute 02/22/2015 0.0  0.0 - 0.2 x10E3/uL Final  . Immature Granulocytes 02/22/2015 0   Final  . Immature Grans (Abs) 02/22/2015 0.0  0.0 - 0.1 x10E3/uL Final  . Salmonella/Shigella Screen 02/23/2015 CANCELED   Final-Edited   Comment: No Ova / Parasite transport container received.  Result canceled by the ancillary   . Campylobacter Culture 02/23/2015 CANCELED   Final-Edited   Comment: No Ova / Parasite transport container received.  Result canceled by the ancillary   . E coli, Shiga toxin Assay 02/23/2015 CANCELED   Final-Edited   Comment: No Ova / Parasite transport container received.  Result canceled by the ancillary   . Ova + Parasite Exam 02/23/2015 CANCELED   Final-Edited   Comment: No Ova / Parasite transport container received. These results were obtained using wet preparation(s) and trichrome stained smear. This test does not include testing  for Cryptosporidium parvum, Cyclospora, or Microsporidia.  Result canceled by the ancillary   . C difficile by pcr 02/23/2015 Negative  Negative Final     Assessment/Plan  1. Loose stools Check on results of stool cultures  2. Abnormality of gait Continue with braces and canes  3. Hyperlipidemia Elevated total cholesterol and LDL. Patient, in my opinion, is not a candidate for cholesterol-lowering agents of the statin class due to his neuromuscular disease.  4. CKD (chronic kidney disease), stage III Stable and unchanged

## 2015-03-31 ENCOUNTER — Other Ambulatory Visit: Payer: Self-pay

## 2015-03-31 ENCOUNTER — Telehealth: Payer: Self-pay

## 2015-03-31 DIAGNOSIS — R195 Other fecal abnormalities: Secondary | ICD-10-CM

## 2015-03-31 NOTE — Telephone Encounter (Signed)
Per Dr.Green patient needs to recollect stool sample, all orders that were cancelled on 02/23/15 need to re-evaluated.  I called patient and discussed Dr.Greens recommedations to recollect stool sample. Patient will pick-up kit tomorrow with instructions. Future orders placed

## 2015-03-31 NOTE — Addendum Note (Signed)
Addended by: Logan Bores on: 03/31/2015 02:38 PM   Modules accepted: Orders

## 2015-04-04 ENCOUNTER — Other Ambulatory Visit: Payer: Self-pay | Admitting: Internal Medicine

## 2015-04-05 ENCOUNTER — Other Ambulatory Visit: Payer: Medicare Other

## 2015-04-05 DIAGNOSIS — R195 Other fecal abnormalities: Secondary | ICD-10-CM

## 2015-04-07 ENCOUNTER — Telehealth: Payer: Self-pay | Admitting: *Deleted

## 2015-04-07 LAB — OVA AND PARASITE EXAMINATION

## 2015-04-07 NOTE — Telephone Encounter (Signed)
Spoke with patient's wife regarding his bowel problems, I informed her that Dr. Nyoka Cowden stated that it would be al right to start the Imodium again, but he would still like for him to go see a gastroenterologists about his bowel issues. She stated that they already have appointment on 05/11/2015 and when the lab results comes back we will call them.

## 2015-04-07 NOTE — Telephone Encounter (Signed)
Called regarding his bowel problems that he had discussed with Dr. Nyoka Cowden on his last appointment. He stated that he stopped taking the Imodium after the 4th like they agreed upon but he is still having the same problems and wants to know what to do. I informed him that I would call Dr Nyoka Cowden and call him back, he stated that would be great.

## 2015-04-10 LAB — STOOL CULTURE: E coli, Shiga toxin Assay: NEGATIVE

## 2015-04-11 ENCOUNTER — Telehealth: Payer: Self-pay | Admitting: *Deleted

## 2015-04-11 NOTE — Telephone Encounter (Signed)
Spoke with patient spouse regarding his labs, I informed her that I would have Dr.Green take a look at the final result and we will give them a call on 04/12/2015

## 2015-04-12 ENCOUNTER — Telehealth: Payer: Self-pay | Admitting: *Deleted

## 2015-04-12 NOTE — Telephone Encounter (Signed)
-----   Message from [name], MD sent at 04/11/2015  6:54 PM EDT ----- Stool cultures and tests for ova and parasites were all negative. At this point, there does not seem to be an infectious origin of his diarrhea.  ----- Message -----    From: Eilene Ghazi, RMA    Sent: 04/11/2015   4:16 PM      To: [name], MD  Hi Dr. Nyoka Cowden , [facility] have called wanting to know the results of the stool sample that they left in the lab on 04/05/15. There are some results in Epic, Please Advise!

## 2015-04-12 NOTE — Telephone Encounter (Signed)
Spoke with patient regarding his stool sample results, informing him that they are negative. He stated that he would continue doing what he is doing until he goes to see the gastroenterologist. Also told him to feel free to call for anything that he may need before his next appointment.

## 2015-05-11 ENCOUNTER — Ambulatory Visit (INDEPENDENT_AMBULATORY_CARE_PROVIDER_SITE_OTHER): Payer: Medicare Other | Admitting: Gastroenterology

## 2015-05-11 ENCOUNTER — Encounter: Payer: Self-pay | Admitting: Gastroenterology

## 2015-05-11 VITALS — BP 128/86 | HR 68 | Ht 68.0 in | Wt 164.0 lb

## 2015-05-11 DIAGNOSIS — R197 Diarrhea, unspecified: Secondary | ICD-10-CM

## 2015-05-11 NOTE — Patient Instructions (Signed)
If your loose stools return, please call. At that point would likely recommend that you resume your imodium and probably have a colonoscopy.

## 2015-05-11 NOTE — Progress Notes (Signed)
HPI: This is a  very pleasant 81 year old man    who was referred to me by [name], MD  to evaluate  diarrhea .    Chief complaint is diarrhea  Was pretty bothered with diarrhea for about 2 months.    Was put on lamisil in February to treat toe fungus.  The loose stools started after a 2 month course of this abx and he stopped.  Very loose, non-bloody diarrhea stools daily.  Never nocturnal.  Imodium helped.  He would take one pill of imodium once daily. Stopped the daily imodium in July and the loose stools came back.  He had no fevers or chills, no significant abdominal pains.  Stool testing results noted.    Overall stable weight.  No colon cancer in family.    Review of systems: Pertinent positive and negative review of systems were noted in the above HPI section. Complete review of systems was performed and was otherwise normal.   Past Medical History  Diagnosis Date  . Gout     takes Allopurinol daily  . Peripheral neuropathy   . Enlarged prostate     takes Tamsulosin daily  . Hypertension     takes Enalapril daily   . Hyperlipidemia     but doesn't require meds  . Peripheral neuropathy   . TIA (transient ischemic attack)     sees Dr.Willis-neurologist 6'14(strange feeling of head)-no residual  . Urinary frequency   . Urinary urgency   . Macular degeneration     takes eye cap vits daily  . Foot drop, bilateral   . Gait disorder     balance issues-uses walker or mobile chair.  . Degenerative arthritis   . Ptosis     Left side  . Chronic kidney disease, stage III (moderate)   . Inguinal hernia without mention of obstruction or gangrene, unilateral or unspecified, (not specified as recurrent)   . Other abnormal blood chemistry   . Spinal stenosis, lumbar region, with neurogenic claudication   . Unspecified vitamin D deficiency   . Essential and other specified forms of tremor   . Abnormality of gait   . Gout, unspecified   . Encounter for long-term  (current) use of other medications   . Internal hemorrhoids without mention of complication   . Benign neoplasm of colon   . Diverticulosis of colon (without mention of hemorrhage)   . Duodenal ulcer, unspecified as acute or chronic, without hemorrhage, perforation, or obstruction   . Complication of anesthesia     postop headache after general anesthesia  . Foley catheter in place 09-01-13    4 months now  . Pneumonia     hx of;last time 8'14-mild.  Marland Kitchen UTI (lower urinary tract infection) 09-01-13    tx. 2 weeks ago    Past Surgical History  Procedure Laterality Date  . Hernia repair      at least 29yrs ago  . Left leg surgery  2011  . Colonosocpy    . Inguinal hernia repair  09/05/2012    Procedure: LAPAROSCOPIC INGUINAL HERNIA;  Surgeon: Gayland Curry, MD,FACS;  Location: [facility];  Service: General;  Laterality: Left;  . Insertion of mesh  09/05/2012    Procedure: INSERTION OF MESH;  Surgeon: Gayland Curry, MD,FACS;  Location: [facility];  Service: General;  Laterality: Left;  . Finger surgery Left     Left ring finger  . Nerve biopsy    . Hip pinning,cannulated Left 05/05/2013  Procedure: CANNULATED HIP PINNING- left;  Surgeon: Renette Butters, MD;  Location: [facility];  Service: Orthopedics;  Laterality: Left;  . Transurethral resection of prostate N/A 09/07/2013    Procedure: CYSTO TRANSURETHRAL RESECTION OF THE PROSTATE (TURP);  Surgeon: Reece Packer, MD;  Location: [facility];  Service: Urology;  Laterality: N/A;  . Cataract extraction Right 08/31/2014    Dr.Lyles    Current Outpatient Prescriptions  Medication Sig Dispense Refill  . acetaminophen (TYLENOL) 500 MG tablet Take 500-1,000 mg by mouth every 6 (six) hours as needed.    Marland Kitchen allopurinol (ZYLOPRIM) 100 MG tablet TAKE ONE TABLET BY MOUTH TWICE DAILY TO PREVENT GOUT 180 tablet 0  . b complex vitamins capsule Take 1 capsule by mouth daily.     . Cholecalciferol (VITAMIN D3) 2000 UNITS TABS Take by mouth daily.    . clopidogrel  (PLAVIX) 75 MG tablet Take one tablet by mouth once daily for anticoagulation 90 tablet 3  . Multiple Vitamins-Minerals (MULTIVITAMINS THER. W/MINERALS) TABS Take 1 tablet by mouth daily.     Marland Kitchen OVER THE COUNTER MEDICATION Apply 1 application topically at bedtime as needed (pain). Phys cream. For neuropathy pain    . primidone (MYSOLINE) 50 MG tablet TAKE ONE TABLET BY MOUTH IN THE EVENING TO HELP WITH TREMORS 90 tablet 0   No current facility-administered medications for this visit.    Allergies as of 05/11/2015 - Review Complete 05/11/2015  Allergen Reaction Noted  . Aleve [naproxen sodium]  12/04/2011  . Nsaids  12/03/2012  . Antihistamines, diphenhydramine-type  04/28/2014    Family History  Problem Relation Age of Onset  . Diabetes Mother     type 2  . Leukemia Father   . COPD Brother   . Lung cancer Sister   . Heart disease Brother     1/2 brother  . Clotting disorder Mother     deceased, blood clot    Social History   Social History  . Marital Status: Married    Spouse Name: N/A  . Number of Children: 2  . Years of Education: 12   Occupational History  . Retired    Social History Main Topics  . Smoking status: Former Smoker    Types: Cigarettes    Quit date: 05/10/1985  . Smokeless tobacco: Not on file  . Alcohol Use: No  . Drug Use: No  . Sexual Activity: Not Currently   Other Topics Concern  . Not on file   Social History Narrative     Physical Exam: BP 128/86 mmHg  Pulse 68  Ht 5\' 8"  (1.727 m)  Wt 164 lb (74.39 kg)  BMI 24.94 kg/m2 Constitutional: generally well-appearing Psychiatric: alert and oriented x3 Eyes: extraocular movements intact Mouth: oral pharynx moist, no lesions Neck: supple no lymphadenopathy Cardiovascular: heart regular rate and rhythm Lungs: clear to auscultation bilaterally Abdomen: soft, nontender, nondistended, no obvious ascites, no peritoneal signs, normal bowel sounds Extremities: no lower extremity edema  bilaterally Skin: no lesions on visible extremities   Assessment and plan: 81 y.o. male with  diarrhea  The loose stools have completely resolved. He has not needed Imodium in 2 or 3 weeks and feels really completely back to normal in terms of his bowels. He has not had colonoscopy in about 30 years. I offered to do that for him now given his recent diarrhea however he walks with a walker, his mobility is limited since stroke. I told him I think it is certainly safe to  not proceed with colonoscopy at this point since his loose stools were minimal, they happened after he started a medicine which has diarrhea as a #5 side effect. And his stools have completely gone back to normal. He really liked that idea, approach. He knows to call if he has a return of his symptoms and at that point I would probably recommend he restart a single Imodium once daily and also that we proceed with colonoscopy.   Owens Loffler, MD [facility] 05/11/2015, 9:12 AM  Cc: [name], MD

## 2015-06-29 ENCOUNTER — Ambulatory Visit (INDEPENDENT_AMBULATORY_CARE_PROVIDER_SITE_OTHER): Payer: Medicare Other | Admitting: Internal Medicine

## 2015-06-29 ENCOUNTER — Ambulatory Visit
Admission: RE | Admit: 2015-06-29 | Discharge: 2015-06-29 | Disposition: A | Discharge status: Home / Self Care | Payer: Medicare Other | Source: Ambulatory Visit | Attending: Internal Medicine | Admitting: Internal Medicine

## 2015-06-29 ENCOUNTER — Encounter: Payer: Self-pay | Admitting: Internal Medicine

## 2015-06-29 VITALS — BP 128/82 | HR 70 | Temp 97.6°F | Ht 68.0 in | Wt 166.4 lb

## 2015-06-29 DIAGNOSIS — M609 Myositis, unspecified: Secondary | ICD-10-CM | POA: Diagnosis not present

## 2015-06-29 DIAGNOSIS — R059 Cough, unspecified: Secondary | ICD-10-CM | POA: Insufficient documentation

## 2015-06-29 DIAGNOSIS — M791 Myalgia: Secondary | ICD-10-CM

## 2015-06-29 DIAGNOSIS — IMO0001 Reserved for inherently not codable concepts without codable children: Secondary | ICD-10-CM

## 2015-06-29 DIAGNOSIS — Z23 Encounter for immunization: Secondary | ICD-10-CM

## 2015-06-29 DIAGNOSIS — R05 Cough: Secondary | ICD-10-CM | POA: Diagnosis not present

## 2015-06-29 NOTE — Progress Notes (Signed)
Patient ID: Drew Gibson, male   DOB: [birth year], 81 y.o.   MRN: [id number]    Facility  [place]    Place of Service:   OFFICE    Allergies  Allergen Reactions  . Aleve [Naproxen Sodium]     Upset stomach   . Nsaids     Bother his stomach  . Antihistamines, Diphenhydramine-Type     Causes urinary issues    Chief Complaint  Patient presents with  . Acute Visit    Complains of Pain under Shoulder Blades for about 2 months.    HPI:  Cough - persistent dry cough over the last 1-2 months. No fever. No night sweats.  Myalgia and myositis - complains of pain in the upper anterior chest as well as in the interscapular area. Patient used a post hole digger in his garden and had pain. The pain, which was in the same area as today's complaint, did resolve some, so he went back to finish the job and used the post hole digger again. He is now wondering what he should do about the pain.    Medications: Patient's Medications  New Prescriptions   No medications on file  Previous Medications   ACETAMINOPHEN (TYLENOL) 500 MG TABLET    Take 500-1,000 mg by mouth every 6 (six) hours as needed.   ALLOPURINOL (ZYLOPRIM) 100 MG TABLET    TAKE ONE TABLET BY MOUTH TWICE DAILY TO PREVENT GOUT   B COMPLEX VITAMINS CAPSULE    Take 1 capsule by mouth daily.    CHOLECALCIFEROL (VITAMIN D3) 2000 UNITS TABS    Take by mouth daily.   CLOPIDOGREL (PLAVIX) 75 MG TABLET    Take one tablet by mouth once daily for anticoagulation   MULTIPLE VITAMINS-MINERALS (MULTIVITAMINS THER. W/MINERALS) TABS    Take 1 tablet by mouth daily.    OVER THE COUNTER MEDICATION    Apply 1 application topically at bedtime as needed (pain). Phys cream. For neuropathy pain   PRIMIDONE (MYSOLINE) 50 MG TABLET    TAKE ONE TABLET BY MOUTH IN THE EVENING TO HELP WITH TREMORS  Modified Medications   No medications on file  Discontinued Medications   No medications on file     Review of Systems  Constitutional: Positive for fatigue.  Negative for fever.  HENT:       Light headed and dizzy feelings  Eyes: Positive for visual disturbance (corrective lenses).  Respiratory: Positive for cough.   Cardiovascular: Negative for chest pain, palpitations and leg swelling.  Gastrointestinal: Positive for diarrhea. Negative for abdominal pain and blood in stool.       Episodes of aspiration have improved. Swallowing has been affected by his neuro muscular disease. Recent problems with loose stools.  Endocrine: Negative.   Genitourinary: Positive for frequency and difficulty urinating.       Catheterized due to obstructive uropathy from BPH. Prior issues with nocturia and urgency.  Musculoskeletal:       Pain upper anterior chest and in the interscapular area that may be related to use of the post hole digger.  S/P left hip fx  Skin: Negative.   Neurological:       Polyneuropathy. Bilateral foot drop.  Hematological:       Anemia.  Psychiatric/Behavioral: Negative.     Filed Vitals:   06/29/15 1444  BP: 128/82  Pulse: 70  Temp: 97.6 F (36.4 C)  TempSrc: Oral  Height: '5\' 8"'  (1.727 m)  Weight: 166 lb 6.4 oz (75.479  kg)   Body mass index is 25.31 kg/(m^2).  Physical Exam  Constitutional: He is oriented to person, place, and time.  Thin, frail   HENT:  Head: Normocephalic and atraumatic.  Right Ear: External ear normal.  Left Ear: External ear normal.  Nose: Nose normal.  Eyes: Conjunctivae and EOM are normal.  Mild erythema of the right eye.  Neck: No JVD present. No tracheal deviation present. No thyromegaly present.  Cardiovascular: Normal rate, regular rhythm, normal heart sounds and intact distal pulses.  Exam reveals no gallop and no friction rub.   No murmur heard. Pulmonary/Chest: No respiratory distress. He has no wheezes. He has no rales. He exhibits no tenderness.  Slight cough during today's exam.  Abdominal: He exhibits no distension and no mass. There is no tenderness.  Genitourinary: Rectum  normal. Guaiac negative stool.  Formed stool in rectum. No obstruction.  Musculoskeletal: He exhibits no edema or tenderness.  Prior left hip fx. Healed incision. Unstable gait. Using walker.Muscular wasting of hand muscles and forearm muscles and lower leg muscles. Contractures of all fingers in both hands. Tender in the intrascapular area and in the upper pectoral muscles of the chest.  Lymphadenopathy:    He has no cervical adenopathy.  Neurological: He is alert and oriented to person, place, and time. No cranial nerve deficit. Coordination abnormal.  Bilateral foot drop. Tremor. Generalized weakness based on neuromuscular disease.  Skin: No rash noted. No erythema. No pallor.  Psychiatric: He has a normal mood and affect. His behavior is normal. Judgment and thought content normal.     Labs reviewed: Lab Summary Latest Ref Rng 02/22/2015 08/25/2014  Hemoglobin 12.6 - 17.7 g/dL 12.9 13.6  Hematocrit 37.5 - 51.0 % 38.9 40.5  White count 3.4 - 10.8 x10E3/uL 6.3 5.7  Platelet count 150 - 379 x10E3/uL 175 173  Sodium 134 - 144 mmol/L 137 143  Potassium 3.5 - 5.2 mmol/L 4.7 4.8  Calcium 8.6 - 10.2 mg/dL 9.2 9.4  Phosphorus - (None) (None)  Creatinine 0.76 - 1.27 mg/dL 1.47(H) 1.45(H)  AST 0 - 40 IU/L 15 23  Alk Phos 39 - 117 IU/L 70 77  Bilirubin 0.0 - 1.2 mg/dL 0.3 0.4  Glucose 65 - 99 mg/dL 84 83  Cholesterol - (None) (None)  HDL cholesterol >39 mg/dL 60 56  Triglycerides 0 - 149 mg/dL 99 115  LDL Direct - (None) (None)  LDL Calc 0 - 99 mg/dL 199(H) 208(H)  Total protein - (None) (None)  Albumin 3.5 - 4.7 g/dL 3.8 3.9   No results found for: TSH, T3TOTAL, T4TOTAL, THYROIDAB Lab Results  Component Value Date   BUN 36* 02/22/2015   No results found for: HGBA1C     Assessment/Plan 1. Cough - DG Chest 2 View; Future  2. Myalgia and myositis Recommended Aleve twice daily and to try warm packs as well as Aspercreme or Myoflex to areas of discomfort. - DG Chest 2 View;  Future  3. Encounter for immunization Flu vaccine

## 2015-06-29 NOTE — Patient Instructions (Signed)
Get chest xray. Use Aleve twice daily. Warm packs may help. Myoflex or Aspercream may help.

## 2015-07-04 ENCOUNTER — Other Ambulatory Visit: Payer: Self-pay | Admitting: Internal Medicine

## 2015-09-27 ENCOUNTER — Other Ambulatory Visit: Payer: Medicare Other

## 2015-09-27 DIAGNOSIS — I10 Essential (primary) hypertension: Secondary | ICD-10-CM

## 2015-09-27 DIAGNOSIS — M1A9XX0 Chronic gout, unspecified, without tophus (tophi): Secondary | ICD-10-CM

## 2015-09-27 DIAGNOSIS — N183 Chronic kidney disease, stage 3 unspecified: Secondary | ICD-10-CM

## 2015-09-27 DIAGNOSIS — R972 Elevated prostate specific antigen [PSA]: Secondary | ICD-10-CM

## 2015-09-27 DIAGNOSIS — E785 Hyperlipidemia, unspecified: Secondary | ICD-10-CM

## 2015-09-28 ENCOUNTER — Ambulatory Visit (INDEPENDENT_AMBULATORY_CARE_PROVIDER_SITE_OTHER): Payer: Medicare Other | Admitting: Internal Medicine

## 2015-09-28 ENCOUNTER — Encounter: Payer: Self-pay | Admitting: Internal Medicine

## 2015-09-28 VITALS — BP 120/82 | HR 80 | Temp 97.0°F | Resp 20 | Ht 68.0 in | Wt 168.2 lb

## 2015-09-28 DIAGNOSIS — N183 Chronic kidney disease, stage 3 unspecified: Secondary | ICD-10-CM

## 2015-09-28 DIAGNOSIS — M24549 Contracture, unspecified hand: Secondary | ICD-10-CM | POA: Diagnosis not present

## 2015-09-28 DIAGNOSIS — G63 Polyneuropathy in diseases classified elsewhere: Secondary | ICD-10-CM | POA: Diagnosis not present

## 2015-09-28 DIAGNOSIS — M4806 Spinal stenosis, lumbar region: Secondary | ICD-10-CM

## 2015-09-28 DIAGNOSIS — E785 Hyperlipidemia, unspecified: Secondary | ICD-10-CM | POA: Diagnosis not present

## 2015-09-28 DIAGNOSIS — R195 Other fecal abnormalities: Secondary | ICD-10-CM | POA: Diagnosis not present

## 2015-09-28 DIAGNOSIS — M1A9XX0 Chronic gout, unspecified, without tophus (tophi): Secondary | ICD-10-CM | POA: Diagnosis not present

## 2015-09-28 DIAGNOSIS — D649 Anemia, unspecified: Secondary | ICD-10-CM

## 2015-09-28 DIAGNOSIS — R972 Elevated prostate specific antigen [PSA]: Secondary | ICD-10-CM | POA: Diagnosis not present

## 2015-09-28 DIAGNOSIS — R269 Unspecified abnormalities of gait and mobility: Secondary | ICD-10-CM | POA: Diagnosis not present

## 2015-09-28 DIAGNOSIS — G250 Essential tremor: Secondary | ICD-10-CM

## 2015-09-28 DIAGNOSIS — M48062 Spinal stenosis, lumbar region with neurogenic claudication: Secondary | ICD-10-CM

## 2015-09-28 DIAGNOSIS — I10 Essential (primary) hypertension: Secondary | ICD-10-CM | POA: Diagnosis not present

## 2015-09-28 DIAGNOSIS — R1314 Dysphagia, pharyngoesophageal phase: Secondary | ICD-10-CM

## 2015-09-28 LAB — CBC WITH DIFFERENTIAL/PLATELET
BASOS: 1 %
Basophils Absolute: 0 10*3/uL (ref 0.0–0.2)
EOS (ABSOLUTE): 0.4 10*3/uL (ref 0.0–0.4)
EOS: 6 %
HEMATOCRIT: 40.4 % (ref 37.5–51.0)
HEMOGLOBIN: 13.5 g/dL (ref 12.6–17.7)
IMMATURE GRANULOCYTES: 1 %
Immature Grans (Abs): 0 10*3/uL (ref 0.0–0.1)
LYMPHS ABS: 2.7 10*3/uL (ref 0.7–3.1)
Lymphs: 38 %
MCH: 32.4 pg (ref 26.6–33.0)
MCHC: 33.4 g/dL (ref 31.5–35.7)
MCV: 97 fL (ref 79–97)
MONOCYTES: 8 %
Monocytes Absolute: 0.6 10*3/uL (ref 0.1–0.9)
Neutrophils Absolute: 3.3 10*3/uL (ref 1.4–7.0)
Neutrophils: 46 %
Platelets: 173 10*3/uL (ref 150–379)
RBC: 4.17 x10E6/uL (ref 4.14–5.80)
RDW: 15.6 % — ABNORMAL HIGH (ref 12.3–15.4)
WBC: 7.2 10*3/uL (ref 3.4–10.8)

## 2015-09-28 LAB — COMPREHENSIVE METABOLIC PANEL
ALBUMIN: 3.5 g/dL (ref 3.5–4.7)
ALT: 18 IU/L (ref 0–44)
AST: 20 IU/L (ref 0–40)
Albumin/Globulin Ratio: 1.2 (ref 1.1–2.5)
Alkaline Phosphatase: 86 IU/L (ref 39–117)
BUN / CREAT RATIO: 20 (ref 10–22)
BUN: 26 mg/dL (ref 8–27)
Bilirubin Total: 0.3 mg/dL (ref 0.0–1.2)
CO2: 23 mmol/L (ref 18–29)
CREATININE: 1.33 mg/dL — AB (ref 0.76–1.27)
Calcium: 9 mg/dL (ref 8.6–10.2)
Chloride: 100 mmol/L (ref 96–106)
GFR, EST AFRICAN AMERICAN: 56 mL/min/{1.73_m2} — AB (ref >59)
GFR, EST NON AFRICAN AMERICAN: 49 mL/min/{1.73_m2} — AB (ref >59)
GLUCOSE: 80 mg/dL (ref 65–99)
Globulin, Total: 2.9 g/dL (ref 1.5–4.5)
Potassium: 4.9 mmol/L (ref 3.5–5.2)
Sodium: 139 mmol/L (ref 134–144)
TOTAL PROTEIN: 6.4 g/dL (ref 6.0–8.5)

## 2015-09-28 LAB — LIPID PANEL
CHOLESTEROL TOTAL: 286 mg/dL — AB (ref 100–199)
Chol/HDL Ratio: 5.2 ratio units — ABNORMAL HIGH (ref 0.0–5.0)
HDL: 55 mg/dL (ref >39)
LDL Calculated: 207 mg/dL — ABNORMAL HIGH (ref 0–99)
Triglycerides: 120 mg/dL (ref 0–149)
VLDL Cholesterol Cal: 24 mg/dL (ref 5–40)

## 2015-09-28 LAB — PSA: Prostate Specific Ag, Serum: 8.6 ng/mL — ABNORMAL HIGH (ref 0.0–4.0)

## 2015-09-28 LAB — URIC ACID: Uric Acid: 6.6 mg/dL (ref 3.7–8.6)

## 2015-09-28 MED ORDER — PRIMIDONE 50 MG PO TABS: ORAL_TABLET | ORAL | Status: DC

## 2015-09-28 MED ORDER — FINASTERIDE 5 MG PO TABS
ORAL_TABLET | ORAL | Status: DC
Start: 2015-09-28 — End: 2016-12-31

## 2015-09-28 MED ORDER — ALLOPURINOL 100 MG PO TABS: ORAL_TABLET | ORAL | Status: DC

## 2015-09-28 NOTE — Progress Notes (Signed)
Patient ID: Drew Gibson, male   DOB: 06-03-1929, 81 y.o.   MRN: [id number]    Facility  [place]    Place of Service:   OFFICE    Allergies  Allergen Reactions  . Aleve [Naproxen Sodium]     Upset stomach   . Nsaids     Bother his stomach  . Antihistamines, Diphenhydramine-Type     Causes urinary issues    Chief Complaint  Patient presents with  . Medical Management of Chronic Issues    6 mo f/u    HPI:  Polyneuropathy in other diseases classified elsewhere [facility]) -  Patient has not seen a neurologist for 2 years. The last time he saw the neurologist he was told that there really was nothing new to offer him. Dr. Jannifer Franklin recommended that he just call should he feel that he is having a problem. Patient states that he feels much the same. There is some problems with choking. Peripheral muscular wasting is unchanged and progressing slowly. Gait is unstable. There have been no new falls recently.  Dysphagia, pharyngoesophageal phase -  Continues with intermittent cough. Denies any recent choking episodes.  Spinal stenosis, lumbar region, with neurogenic claudication -  Back is much improved and no longer gives him problems. Activity levels have been reduced by the overall debility induced by the polyneuropathy.  Abnormality of gait -  Depending on the walker  CKD (chronic kidney disease), stage III -  Improved compared to previous values  Chronic gout without tophus, unspecified cause, unspecified site - . No recent gout attacks. Uric acid in a normal range...  Hyperlipidemia -  Continues with high total cholesterol and LDL. Values are similar to ones obtained over the last year.  Essential hypertension -  controlled  Anemia, unspecified anemia type -  Resolved. Normal hemoglobin.  Loose stools -  improved  Contracture of joint of hand, unspecified laterality -  Severe interosseous muscle wasting.  PSA elevation - has increased to 8.6. Last value was 7.2. Patient states urine  stream has diminished. There is no discomfort with urination. Denies nocturia. Has some urgency.    Medications: Patient's Medications  New Prescriptions   No medications on file  Previous Medications   ACETAMINOPHEN (TYLENOL) 500 MG TABLET    Take 500-1,000 mg by mouth every 6 (six) hours as needed.   ALLOPURINOL (ZYLOPRIM) 100 MG TABLET    TAKE ONE TABLET BY MOUTH TWICE DAILY TO PREVENT GOUT.   B COMPLEX VITAMINS CAPSULE    Take 1 capsule by mouth daily.    CHOLECALCIFEROL (VITAMIN D3) 2000 UNITS TABS    Take by mouth daily.   CLOPIDOGREL (PLAVIX) 75 MG TABLET    Take one tablet by mouth once daily for anticoagulation   MULTIPLE VITAMINS-MINERALS (MULTIVITAMINS THER. W/MINERALS) TABS    Take 1 tablet by mouth daily.    PRIMIDONE (MYSOLINE) 50 MG TABLET    TAKE ONE TABLET BY MOUTH ONCE DAILY IN THE EVENING TO HELP WITH TREMORS.  Modified Medications   No medications on file  Discontinued Medications   OVER THE COUNTER MEDICATION    Apply 1 application topically at bedtime as needed (pain). Phys cream. For neuropathy pain    Review of Systems  Constitutional: Positive for fatigue. Negative for fever.  HENT:       Light headed and dizzy feelings  Eyes: Positive for visual disturbance (corrective lenses).  Respiratory: Positive for cough.   Cardiovascular: Negative for chest pain, palpitations and leg swelling.  Gastrointestinal: Positive for diarrhea. Negative for abdominal pain and blood in stool.       Episodes of aspiration have improved. Swallowing has been affected by his neuro muscular disease. Recent problems with loose stools.  Endocrine: Negative.   Genitourinary: Positive for frequency and difficulty urinating.       Issues with nocturia and urgency. Rising PSA.  Musculoskeletal:       Pain upper anterior chest and in the interscapular area that may be related to use of the post hole digger.  S/P left hip fx  Skin: Negative.   Neurological:       Polyneuropathy.  Bilateral foot drop.  Hematological:       Anemia.  Psychiatric/Behavioral: Negative.     Filed Vitals:   09/28/15 1111  BP: 120/82  Pulse: 80  Temp: 97 F (36.1 C)  TempSrc: Oral  Resp: 20  Height: 5' 8" (1.727 m)  Weight: 168 lb 3.2 oz (76.295 kg)  SpO2: 98%   Body mass index is 25.58 kg/(m^2). Filed Weights   09/28/15 1111  Weight: 168 lb 3.2 oz (76.295 kg)     Physical Exam  Constitutional: He is oriented to person, place, and time.  Thin, frail   HENT:  Head: Normocephalic and atraumatic.  Right Ear: External ear normal.  Left Ear: External ear normal.  Nose: Nose normal.  Eyes: Conjunctivae and EOM are normal.  Mild erythema of the right eye.  Neck: No JVD present. No tracheal deviation present. No thyromegaly present.  Cardiovascular: Normal rate, regular rhythm, normal heart sounds and intact distal pulses.  Exam reveals no gallop and no friction rub.   No murmur heard. Pulmonary/Chest: No respiratory distress. He has no wheezes. He has no rales. He exhibits no tenderness.  Slight cough during today's exam.  Abdominal: He exhibits no distension and no mass. There is no tenderness.  Genitourinary: Rectum normal. Guaiac negative stool.  Formed stool in rectum. No obstruction.  Musculoskeletal: He exhibits no edema or tenderness.  Prior left hip fx. Healed incision. Unstable gait. Using walker.Muscular wasting of hand muscles and forearm muscles and lower leg muscles. Contractures of all fingers in both hands. Tender in the interscapular area and in the upper pectoral muscles of the chest.  Lymphadenopathy:    He has no cervical adenopathy.  Neurological: He is alert and oriented to person, place, and time. No cranial nerve deficit. Coordination abnormal.  Bilateral foot drop. Tremor. Generalized weakness based on neuromuscular disease.  Skin: No rash noted. No erythema. No pallor.  Psychiatric: He has a normal mood and affect. His behavior is normal.  Judgment and thought content normal.    Labs reviewed: Lab Summary Latest Ref Rng 09/27/2015 02/22/2015  Hemoglobin 12.6 - 17.7 g/dL 13.5 12.9  Hematocrit 37.5 - 51.0 % 40.4 38.9  White count 3.4 - 10.8 x10E3/uL 7.2 6.3  Platelet count 150 - 379 x10E3/uL 173 175  Sodium 134 - 144 mmol/L 139 137  Potassium 3.5 - 5.2 mmol/L 4.9 4.7  Calcium 8.6 - 10.2 mg/dL 9.0 9.2  Phosphorus - (None) (None)  Creatinine 0.76 - 1.27 mg/dL 1.33(H) 1.47(H)  AST 0 - 40 IU/L 20 15  Alk Phos 39 - 117 IU/L 86 70  Bilirubin 0.0 - 1.2 mg/dL 0.3 0.3  Glucose 65 - 99 mg/dL 80 84  Cholesterol - (None) (None)  HDL cholesterol >39 mg/dL 55 60  Triglycerides 0 - 149 mg/dL 120 99  LDL Direct - (None) (None)  LDL Calc 0 -  99 mg/dL 207(H) 199(H)  Total protein - (None) (None)  Albumin 3.5 - 4.7 g/dL 3.5 3.8   No results found for: TSH, T3TOTAL, T4TOTAL, THYROIDAB Lab Results  Component Value Date   BUN 26 09/27/2015   BUN 36* 02/22/2015   BUN 32* 08/25/2014   No results found for: HGBA1C  Lab Results  Component Value Date   PSA 8.6* 09/27/2015   PSA 7.2* 02/22/2015   PSA 7.3* 08/25/2014     Assessment/Plan  1. Polyneuropathy in other diseases classified elsewhere ([place]) Unchanged  2. Dysphagia, pharyngoesophageal phase improved  3. Spinal stenosis, lumbar region, with neurogenic claudication asymptomatic  4. Abnormality of gait continue use of walker  5. CKD (chronic kidney disease), stage III -BMP, future - Basic metabolic panel; Future  6. Chronic gout without tophus, unspecified cause, unspecified site - allopurinol (ZYLOPRIM) 100 MG tablet; TAKE ONE TABLET BY MOUTH TWICE DAILY TO PREVENT GOUT.  Dispense: 180 tablet; Refill: 3  7. Hyperlipidemia Check once yearly  8. Essential hypertension - Basic metabolic panel; Future  9. Anemia, unspecified anemia type resolved*  10. Loose stools improved  11. Contracture of joint of hand, unspecified laterality Try using Nerf ball in  hands at night to prevent further contracturing  12. PSA elevation - finasteride (PROSCAR) 5 MG tablet; One daily to help shrink prostate.  Dispense: 90 tablet; Refill: 3 - PSA; Future  13. Benign essential tremor - primidone (MYSOLINE) 50 MG tablet; TAKE ONE TABLET IN THE EVENING TO HELP WITH TREMORS.  Dispense: 90 tablet; Refill: 3

## 2015-10-11 ENCOUNTER — Other Ambulatory Visit: Payer: Self-pay | Admitting: Internal Medicine

## 2015-11-02 DIAGNOSIS — M79672 Pain in left foot: Secondary | ICD-10-CM | POA: Diagnosis not present

## 2015-11-02 DIAGNOSIS — M79671 Pain in right foot: Secondary | ICD-10-CM | POA: Diagnosis not present

## 2015-11-02 DIAGNOSIS — L84 Corns and callosities: Secondary | ICD-10-CM | POA: Diagnosis not present

## 2015-11-02 DIAGNOSIS — B351 Tinea unguium: Secondary | ICD-10-CM | POA: Diagnosis not present

## 2016-02-01 DIAGNOSIS — M79672 Pain in left foot: Secondary | ICD-10-CM | POA: Diagnosis not present

## 2016-02-01 DIAGNOSIS — L84 Corns and callosities: Secondary | ICD-10-CM | POA: Diagnosis not present

## 2016-02-01 DIAGNOSIS — M79671 Pain in right foot: Secondary | ICD-10-CM | POA: Diagnosis not present

## 2016-02-01 DIAGNOSIS — B351 Tinea unguium: Secondary | ICD-10-CM | POA: Diagnosis not present

## 2016-02-01 DIAGNOSIS — L03115 Cellulitis of right lower limb: Secondary | ICD-10-CM | POA: Diagnosis not present

## 2016-02-06 ENCOUNTER — Other Ambulatory Visit: Payer: Medicare Other

## 2016-02-06 DIAGNOSIS — I10 Essential (primary) hypertension: Secondary | ICD-10-CM | POA: Diagnosis not present

## 2016-02-06 DIAGNOSIS — N183 Chronic kidney disease, stage 3 unspecified: Secondary | ICD-10-CM

## 2016-02-06 DIAGNOSIS — R972 Elevated prostate specific antigen [PSA]: Secondary | ICD-10-CM

## 2016-02-07 LAB — BASIC METABOLIC PANEL
BUN / CREAT RATIO: 24 (ref 10–24)
BUN: 40 mg/dL — AB (ref 8–27)
CALCIUM: 9.3 mg/dL (ref 8.6–10.2)
CO2: 24 mmol/L (ref 18–29)
Chloride: 101 mmol/L (ref 96–106)
Creatinine, Ser: 1.67 mg/dL — ABNORMAL HIGH (ref 0.76–1.27)
GFR, EST AFRICAN AMERICAN: 43 mL/min/{1.73_m2} — AB (ref >59)
GFR, EST NON AFRICAN AMERICAN: 37 mL/min/{1.73_m2} — AB (ref >59)
Glucose: 85 mg/dL (ref 65–99)
Potassium: 4.6 mmol/L (ref 3.5–5.2)
Sodium: 142 mmol/L (ref 134–144)

## 2016-02-07 LAB — PSA: Prostate Specific Ag, Serum: 3.4 ng/mL (ref 0.0–4.0)

## 2016-02-08 ENCOUNTER — Ambulatory Visit (INDEPENDENT_AMBULATORY_CARE_PROVIDER_SITE_OTHER): Payer: Medicare Other | Admitting: Internal Medicine

## 2016-02-08 ENCOUNTER — Encounter: Payer: Self-pay | Admitting: Internal Medicine

## 2016-02-08 VITALS — BP 152/96 | HR 93 | Temp 97.5°F | Ht 68.0 in | Wt 170.0 lb

## 2016-02-08 DIAGNOSIS — N183 Chronic kidney disease, stage 3 unspecified: Secondary | ICD-10-CM

## 2016-02-08 DIAGNOSIS — R1314 Dysphagia, pharyngoesophageal phase: Secondary | ICD-10-CM

## 2016-02-08 DIAGNOSIS — I10 Essential (primary) hypertension: Secondary | ICD-10-CM | POA: Diagnosis not present

## 2016-02-08 DIAGNOSIS — G63 Polyneuropathy in diseases classified elsewhere: Secondary | ICD-10-CM

## 2016-02-08 DIAGNOSIS — R972 Elevated prostate specific antigen [PSA]: Secondary | ICD-10-CM | POA: Diagnosis not present

## 2016-02-08 DIAGNOSIS — R269 Unspecified abnormalities of gait and mobility: Secondary | ICD-10-CM

## 2016-02-08 DIAGNOSIS — R195 Other fecal abnormalities: Secondary | ICD-10-CM

## 2016-02-08 DIAGNOSIS — S91301A Unspecified open wound, right foot, initial encounter: Secondary | ICD-10-CM

## 2016-02-08 DIAGNOSIS — S91309A Unspecified open wound, unspecified foot, initial encounter: Secondary | ICD-10-CM | POA: Insufficient documentation

## 2016-02-08 NOTE — Progress Notes (Signed)
Patient ID: Drew Gibson, male   DOB: 1929/02/15, 82 y.o.   MRN: [id number]    Facility  [facility]    Place of Service:   OFFICE    Allergies  Allergen Reactions  . Aleve [Naproxen Sodium]     Upset stomach   . Nsaids     Bother his stomach  . Antihistamines, Diphenhydramine-Type     Causes urinary issues    Chief Complaint  Patient presents with  . Medical Management of Chronic Issues    4 month medication management blood pressure, anemia, polyneuropathy, CKD  . Sore    right foot on top of foot for one week    HPI:  Wound, open, foot, right, initial encounter Small sore on the dorsum of the right foot for over a week. Using mupirocin daily and it is getting better. Wears shoes with AFO. Tongue of the shoe may have rubbed at the skin.  Abnormality of gait - using 4 wheel walker  CKD (chronic kidney disease), stage III - rise in BUN and Creatinine  Dysphagia, pharyngoesophageal phase - less of a problem  Essential hypertension - elevated today. Home BP normal  Polyneuropathy in other diseases classified elsewhere ([place]) - unchanged  Loose stools - still intermittently occurs. usinn Imodium.  PSA is now normal since Proscar was started.  Medications: Patient's Medications  New Prescriptions   No medications on file  Previous Medications   ACETAMINOPHEN (TYLENOL) 500 MG TABLET    Take 500-1,000 mg by mouth every 6 (six) hours as needed.   ALLOPURINOL (ZYLOPRIM) 100 MG TABLET    TAKE ONE TABLET BY MOUTH TWICE DAILY TO PREVENT GOUT.   B COMPLEX VITAMINS CAPSULE    Take 1 capsule by mouth daily.    CHOLECALCIFEROL (VITAMIN D3) 2000 UNITS TABS    Take by mouth daily.   CLOPIDOGREL (PLAVIX) 75 MG TABLET    TAKE ONE TABLET BY MOUTH ONCE DAILY FOR ANTICOAGULATION   FINASTERIDE (PROSCAR) 5 MG TABLET    One daily to help shrink prostate.   PRIMIDONE (MYSOLINE) 50 MG TABLET    TAKE ONE TABLET IN THE EVENING TO HELP WITH TREMORS.  Modified Medications   No medications on file    Discontinued Medications   MULTIPLE VITAMINS-MINERALS (MULTIVITAMINS THER. W/MINERALS) TABS    Take 1 tablet by mouth daily.     Review of Systems  Constitutional: Positive for fatigue. Negative for fever.  HENT:       Light headed and dizzy feelings  Eyes: Positive for visual disturbance (corrective lenses).  Respiratory: Positive for cough.   Cardiovascular: Negative for chest pain, palpitations and leg swelling.  Gastrointestinal: Positive for diarrhea. Negative for abdominal pain and blood in stool.       Episodes of aspiration have improved. Swallowing has been affected by his neuro muscular disease. Recent problems with loose stools.  Endocrine: Negative.   Genitourinary: Positive for frequency and difficulty urinating.       Issues with nocturia and urgency. Rising PSA.  Musculoskeletal:       Pain upper anterior chest and in the interscapular area that may be related to use of the post hole digger.  S/P left hip fx  Skin: Negative.   Neurological:       Polyneuropathy. Bilateral foot drop.  Hematological:       Anemia.  Psychiatric/Behavioral: Negative.     Filed Vitals:   02/08/16 1159  BP: 152/96  Pulse: 93  Temp: 97.5 F (36.4 C)  TempSrc: Oral  Height: 5\' 8"  (1.727 m)  Weight: 170 lb (77.111 kg)  SpO2: 97%   Body mass index is 25.85 kg/(m^2). Filed Weights   02/08/16 1159  Weight: 170 lb (77.111 kg)     Physical Exam  Constitutional: He is oriented to person, place, and time.  Thin, frail   HENT:  Head: Normocephalic and atraumatic.  Right Ear: External ear normal.  Left Ear: External ear normal.  Nose: Nose normal.  Eyes: Conjunctivae and EOM are normal.  Mild erythema of the right eye.  Neck: No JVD present. No tracheal deviation present. No thyromegaly present.  Cardiovascular: Normal rate, regular rhythm, normal heart sounds and intact distal pulses.  Exam reveals no gallop and no friction rub.   No murmur heard. Pulmonary/Chest: No  respiratory distress. He has no wheezes. He has no rales. He exhibits no tenderness.  Slight cough during today's exam.  Abdominal: He exhibits no distension and no mass. There is no tenderness.  Genitourinary: Rectum normal. Guaiac negative stool.  Formed stool in rectum. No obstruction.  Musculoskeletal: He exhibits no edema or tenderness.  Prior left hip fx. Healed incision. Unstable gait. Using walker.Muscular wasting of hand muscles and forearm muscles and lower leg muscles. Contractures of all fingers in both hands. Tender in the interscapular area and in the upper pectoral muscles of the chest.  Lymphadenopathy:    He has no cervical adenopathy.  Neurological: He is alert and oriented to person, place, and time. No cranial nerve deficit. Coordination abnormal.  Bilateral foot drop. Tremor. Generalized weakness based on neuromuscular disease.  Skin: No rash noted. No erythema. No pallor.  Small wound dorsum of the left foot  Psychiatric: He has a normal mood and affect. His behavior is normal. Judgment and thought content normal.    Labs reviewed: Lab Summary Latest Ref Rng 02/06/2016 09/27/2015 02/22/2015  Hemoglobin 12.6 - 17.7 g/dL (None) 02/24/2015 77.8  Hematocrit 37.5 - 51.0 % (None) 40.4 38.9  White count 3.4 - 10.8 x10E3/uL (None) 7.2 6.3  Platelet count 150 - 379 x10E3/uL (None) 173 175  Sodium 134 - 144 mmol/L 142 139 137  Potassium 3.5 - 5.2 mmol/L 4.6 4.9 4.7  Calcium 8.6 - 10.2 mg/dL 9.3 9.0 9.2  Phosphorus - (None) (None) (None)  Creatinine 0.76 - 1.27 mg/dL 76.0) 8.72(T) 7.53(Y)  AST 0 - 40 IU/L (None) 20 15  Alk Phos 39 - 117 IU/L (None) 86 70  Bilirubin 0.0 - 1.2 mg/dL (None) 0.3 0.3  Glucose 65 - 99 mg/dL 85 80 84  Cholesterol - (None) (None) (None)  HDL cholesterol >39 mg/dL (None) 55 60  Triglycerides 0 - 149 mg/dL (None) 6.55(B 99  LDL Direct - (None) (None) (None)  LDL Calc 0 - 99 mg/dL (None) 767) 865(N)  Total protein - (None) (None) (None)  Albumin 3.5 -  4.7 g/dL (None) 3.5 3.8   No results found for: TSH, T3TOTAL, T4TOTAL, THYROIDAB Lab Results  Component Value Date   BUN 40* 02/06/2016   BUN 26 09/27/2015   BUN 36* 02/22/2015   No results found for: HGBA1C  Assessment/Plan  1. Wound, open, foot, right, initial encounter Continue mupirocin. She may need some modification to prevent rubbing.  2. Abnormality of gait Continue walker  3. CKD (chronic kidney disease), stage III Rising BUN and creatinine compared to most recent previous values.  4. Dysphagia, pharyngoesophageal phase Improved  5. Essential hypertension Mild elevation. I did not start medication this visit. Home pressures reportedly  normal. Patient will bring a list of his pressures from home and we will reconsider need for medication at his next visit.  6. Polyneuropathy in other diseases classified elsewhere ([place]) Unchanged  7. Loose stools Intermittent. He says he can live with the issue.  8. PSA elevation Now back to normal since being put on Proscar

## 2016-04-11 DIAGNOSIS — Z85828 Personal history of other malignant neoplasm of skin: Secondary | ICD-10-CM | POA: Diagnosis not present

## 2016-04-11 DIAGNOSIS — L570 Actinic keratosis: Secondary | ICD-10-CM | POA: Diagnosis not present

## 2016-05-02 DIAGNOSIS — M79672 Pain in left foot: Secondary | ICD-10-CM | POA: Diagnosis not present

## 2016-05-02 DIAGNOSIS — B351 Tinea unguium: Secondary | ICD-10-CM | POA: Diagnosis not present

## 2016-05-02 DIAGNOSIS — L84 Corns and callosities: Secondary | ICD-10-CM | POA: Diagnosis not present

## 2016-05-02 DIAGNOSIS — M79671 Pain in right foot: Secondary | ICD-10-CM | POA: Diagnosis not present

## 2016-05-04 ENCOUNTER — Other Ambulatory Visit: Payer: Self-pay | Admitting: Internal Medicine

## 2016-05-18 ENCOUNTER — Other Ambulatory Visit: Payer: Self-pay

## 2016-05-18 DIAGNOSIS — N183 Chronic kidney disease, stage 3 unspecified: Secondary | ICD-10-CM

## 2016-06-01 ENCOUNTER — Other Ambulatory Visit: Payer: Medicare Other

## 2016-06-01 DIAGNOSIS — N183 Chronic kidney disease, stage 3 unspecified: Secondary | ICD-10-CM

## 2016-06-01 LAB — BASIC METABOLIC PANEL WITH GFR
BUN: 36 mg/dL — AB (ref 7–25)
CHLORIDE: 107 mmol/L (ref 98–110)
CO2: 25 mmol/L (ref 20–31)
Calcium: 8.9 mg/dL (ref 8.6–10.3)
Creat: 1.63 mg/dL — ABNORMAL HIGH (ref 0.70–1.11)
GFR, EST AFRICAN AMERICAN: 44 mL/min — AB (ref >=60)
GFR, EST NON AFRICAN AMERICAN: 38 mL/min — AB (ref >=60)
Glucose, Bld: 80 mg/dL (ref 65–99)
POTASSIUM: 4.6 mmol/L (ref 3.5–5.3)
Sodium: 142 mmol/L (ref 135–146)

## 2016-06-06 ENCOUNTER — Encounter: Payer: Self-pay | Admitting: Internal Medicine

## 2016-06-06 ENCOUNTER — Ambulatory Visit (INDEPENDENT_AMBULATORY_CARE_PROVIDER_SITE_OTHER): Payer: Medicare Other | Admitting: Internal Medicine

## 2016-06-06 VITALS — BP 148/92 | HR 70 | Temp 97.4°F | Ht 68.0 in | Wt 169.0 lb

## 2016-06-06 DIAGNOSIS — I10 Essential (primary) hypertension: Secondary | ICD-10-CM

## 2016-06-06 DIAGNOSIS — R269 Unspecified abnormalities of gait and mobility: Secondary | ICD-10-CM | POA: Diagnosis not present

## 2016-06-06 DIAGNOSIS — G63 Polyneuropathy in diseases classified elsewhere: Secondary | ICD-10-CM | POA: Diagnosis not present

## 2016-06-06 DIAGNOSIS — M1A9XX0 Chronic gout, unspecified, without tophus (tophi): Secondary | ICD-10-CM | POA: Diagnosis not present

## 2016-06-06 DIAGNOSIS — N183 Chronic kidney disease, stage 3 unspecified: Secondary | ICD-10-CM

## 2016-06-06 DIAGNOSIS — R1314 Dysphagia, pharyngoesophageal phase: Secondary | ICD-10-CM | POA: Diagnosis not present

## 2016-06-06 DIAGNOSIS — Z23 Encounter for immunization: Secondary | ICD-10-CM | POA: Diagnosis not present

## 2016-06-06 DIAGNOSIS — E785 Hyperlipidemia, unspecified: Secondary | ICD-10-CM

## 2016-06-06 NOTE — Progress Notes (Signed)
Facility  [facility]    Place of Service:   OFFICE    Allergies  Allergen Reactions  . Aleve [Naproxen Sodium]     Upset stomach   . Nsaids     Bother his stomach  . Antihistamines, Diphenhydramine-Type     Causes urinary issues    Chief Complaint  Patient presents with  . Medical Management of Chronic Issues    4 month medication management blood pressure, CKD, cholesterol, review labs, here with wife.    HPI:   Polyneuropathy in other diseases classified elsewhere [facility]) - no choking or falls.  Essential hypertension - mostly normal at home  Dysphagia, pharyngoesophageal phase - improved  CKD (chronic kidney disease), stage III unchanged  Abnormality of gait - unchanged. Using 4 wheel walker regularly.  Encounter for immunization - Plan: Flu Vaccine QUAD 36+ mos IM  Medications: Patient's Medications  New Prescriptions   No medications on file  Previous Medications   ACETAMINOPHEN (TYLENOL) 500 MG TABLET    Take 500-1,000 mg by mouth every 6 (six) hours as needed.   ALLOPURINOL (ZYLOPRIM) 100 MG TABLET    TAKE ONE TABLET BY MOUTH TWICE DAILY TO PREVENT GOUT.   B COMPLEX VITAMINS CAPSULE    Take 1 capsule by mouth daily.    CHOLECALCIFEROL (VITAMIN D3) 2000 UNITS TABS    Take by mouth daily.   CLOPIDOGREL (PLAVIX) 75 MG TABLET    TAKE ONE TABLET BY MOUTH ONCE DAILY FOR  ANTICOAGULATION   FINASTERIDE (PROSCAR) 5 MG TABLET    One daily to help shrink prostate.   PRIMIDONE (MYSOLINE) 50 MG TABLET    TAKE ONE TABLET IN THE EVENING TO HELP WITH TREMORS.  Modified Medications   No medications on file  Discontinued Medications   No medications on file    Review of Systems  Constitutional: Positive for fatigue. Negative for fever.  HENT:       Light headed and dizzy feelings  Eyes: Positive for visual disturbance (corrective lenses).  Respiratory: Positive for cough.   Cardiovascular: Negative for chest pain, palpitations and leg swelling.  Gastrointestinal:  Positive for diarrhea. Negative for abdominal pain and blood in stool.       Episodes of aspiration have improved. Swallowing has been affected by his neuro muscular disease. Occasional problems with loose stools.  Endocrine: Negative.   Genitourinary: Positive for difficulty urinating and frequency.       Issues with nocturia and urgency. Rising PSA.  Musculoskeletal:       S/P left hip fx. Chronic bilateral foot drop. Uses AFO regularly inside his shoe.  Skin: Negative.   Neurological:       Polyneuropathy. Bilateral foot drop.  Hematological:       Hx Anemia.  Psychiatric/Behavioral: Negative.     Vitals:   06/06/16 1214  BP: (!) 148/92  Pulse: 70  Temp: 97.4 F (36.3 C)  TempSrc: Oral  SpO2: 93%  Weight: 169 lb (76.7 kg)  Height: '5\' 8"'  (1.727 m)   Body mass index is 25.7 kg/m. Wt Readings from Last 3 Encounters:  06/06/16 169 lb (76.7 kg)  02/08/16 170 lb (77.1 kg)  09/28/15 168 lb 3.2 oz (76.3 kg)      Physical Exam  Constitutional: He is oriented to person, place, and time.  Thin, frail   HENT:  Head: Normocephalic and atraumatic.  Right Ear: External ear normal.  Left Ear: External ear normal.  Nose: Nose normal.  Eyes: Conjunctivae and EOM  are normal.  Mild erythema of the right eye.  Neck: No JVD present. No tracheal deviation present. No thyromegaly present.  Cardiovascular: Normal rate, regular rhythm, normal heart sounds and intact distal pulses.  Exam reveals no gallop and no friction rub.   No murmur heard. Pulmonary/Chest: No respiratory distress. He has no wheezes. He has no rales. He exhibits no tenderness.  Slight cough during today's exam.  Abdominal: He exhibits no distension and no mass. There is no tenderness.  Genitourinary: Rectum normal. Rectal exam shows guaiac negative stool.  Musculoskeletal: He exhibits no edema or tenderness.  Prior left hip fx. Healed incision. Unstable gait. Using walker.Muscular wasting of hand muscles and  forearm muscles and lower leg muscles. Contractures of all fingers in both hands. Tender in the interscapular area and in the upper pectoral muscles of the chest.  Lymphadenopathy:    He has no cervical adenopathy.  Neurological: He is alert and oriented to person, place, and time. No cranial nerve deficit. Coordination abnormal.  Bilateral foot drop. Tremor. Generalized weakness based on neuromuscular disease.  Skin: No rash noted. No erythema. No pallor.  Psychiatric: He has a normal mood and affect. His behavior is normal. Judgment and thought content normal.    Labs reviewed: Lab Summary Latest Ref Rng & Units 06/01/2016 02/06/2016 09/27/2015 02/22/2015  Hemoglobin 12.6 - 17.7 g/dL (None) (None) 13.5 12.9  Hematocrit 37.5 - 51.0 % (None) (None) 40.4 38.9  White count 3.4 - 10.8 x10E3/uL (None) (None) 7.2 6.3  Platelet count 150 - 379 x10E3/uL (None) (None) 173 175  Sodium 135 - 146 mmol/L 142 142 139 137  Potassium 3.5 - 5.3 mmol/L 4.6 4.6 4.9 4.7  Calcium 8.6 - 10.3 mg/dL 8.9 9.3 9.0 9.2  Phosphorus - (None) (None) (None) (None)  Creatinine 0.70 - 1.11 mg/dL 1.63(H) 1.67(H) 1.33(H) 1.47(H)  AST 0 - 40 IU/L (None) (None) 20 15  Alk Phos 39 - 117 IU/L (None) (None) 86 70  Bilirubin 0.0 - 1.2 mg/dL (None) (None) 0.3 0.3  Glucose 65 - 99 mg/dL 80 85 80 84  Cholesterol - (None) (None) (None) (None)  HDL cholesterol >39 mg/dL (None) (None) 55 60  Triglycerides 0 - 149 mg/dL (None) (None) 120 99  LDL Direct - (None) (None) (None) (None)  LDL Calc 0 - 99 mg/dL (None) (None) 207(H) 199(H)  Total protein - (None) (None) (None) (None)  Albumin 3.5 - 4.7 g/dL (None) (None) 3.5 3.8  Some recent data might be hidden   No results found for: TSH, T3TOTAL, T4TOTAL, THYROIDAB Lab Results  Component Value Date   BUN 36 (H) 06/01/2016   BUN 40 (H) 02/06/2016   BUN 26 09/27/2015   No results found for: HGBA1C  Assessment/Plan  1. Polyneuropathy in other diseases classified elsewhere  ([place]) unchanged  2. Essential hypertension - Comprehensive metabolic panel; Future  3. Dysphagia, pharyngoesophageal phase improved  4. CKD (chronic kidney disease), stage III stable  5. Abnormality of gait unchanged  6. Chronic gout without tophus, unspecified cause, unspecified site - Uric acid; Future  7. Hyperlipidemia - Lipid panel; Future  8. Encounter for immunization - Flu Vaccine QUAD 36+ mos IM

## 2016-08-01 DIAGNOSIS — B351 Tinea unguium: Secondary | ICD-10-CM | POA: Diagnosis not present

## 2016-08-01 DIAGNOSIS — L84 Corns and callosities: Secondary | ICD-10-CM | POA: Diagnosis not present

## 2016-08-01 DIAGNOSIS — M79672 Pain in left foot: Secondary | ICD-10-CM | POA: Diagnosis not present

## 2016-08-01 DIAGNOSIS — M79671 Pain in right foot: Secondary | ICD-10-CM | POA: Diagnosis not present

## 2016-08-25 DIAGNOSIS — G629 Polyneuropathy, unspecified: Secondary | ICD-10-CM | POA: Diagnosis not present

## 2016-08-25 DIAGNOSIS — M19011 Primary osteoarthritis, right shoulder: Secondary | ICD-10-CM | POA: Diagnosis not present

## 2016-08-25 DIAGNOSIS — S72141A Displaced intertrochanteric fracture of right femur, initial encounter for closed fracture: Secondary | ICD-10-CM | POA: Diagnosis not present

## 2016-08-25 DIAGNOSIS — I951 Orthostatic hypotension: Secondary | ICD-10-CM | POA: Diagnosis not present

## 2016-08-25 DIAGNOSIS — Z7982 Long term (current) use of aspirin: Secondary | ICD-10-CM | POA: Diagnosis not present

## 2016-08-25 DIAGNOSIS — W19XXXD Unspecified fall, subsequent encounter: Secondary | ICD-10-CM | POA: Diagnosis not present

## 2016-08-25 DIAGNOSIS — M6281 Muscle weakness (generalized): Secondary | ICD-10-CM | POA: Diagnosis not present

## 2016-08-25 DIAGNOSIS — S72144A Nondisplaced intertrochanteric fracture of right femur, initial encounter for closed fracture: Secondary | ICD-10-CM | POA: Diagnosis not present

## 2016-08-25 DIAGNOSIS — Z8673 Personal history of transient ischemic attack (TIA), and cerebral infarction without residual deficits: Secondary | ICD-10-CM | POA: Diagnosis not present

## 2016-08-25 DIAGNOSIS — S0181XA Laceration without foreign body of other part of head, initial encounter: Secondary | ICD-10-CM | POA: Diagnosis not present

## 2016-08-25 DIAGNOSIS — I10 Essential (primary) hypertension: Secondary | ICD-10-CM | POA: Diagnosis not present

## 2016-08-25 DIAGNOSIS — M1990 Unspecified osteoarthritis, unspecified site: Secondary | ICD-10-CM | POA: Diagnosis not present

## 2016-08-25 DIAGNOSIS — N179 Acute kidney failure, unspecified: Secondary | ICD-10-CM | POA: Diagnosis not present

## 2016-08-25 DIAGNOSIS — M85811 Other specified disorders of bone density and structure, right shoulder: Secondary | ICD-10-CM | POA: Diagnosis not present

## 2016-08-25 DIAGNOSIS — S72001D Fracture of unspecified part of neck of right femur, subsequent encounter for closed fracture with routine healing: Secondary | ICD-10-CM | POA: Diagnosis not present

## 2016-08-25 DIAGNOSIS — R1314 Dysphagia, pharyngoesophageal phase: Secondary | ICD-10-CM | POA: Diagnosis not present

## 2016-08-25 DIAGNOSIS — N183 Chronic kidney disease, stage 3 (moderate): Secondary | ICD-10-CM | POA: Diagnosis not present

## 2016-08-25 DIAGNOSIS — M25551 Pain in right hip: Secondary | ICD-10-CM | POA: Diagnosis not present

## 2016-08-25 DIAGNOSIS — S72001A Fracture of unspecified part of neck of right femur, initial encounter for closed fracture: Secondary | ICD-10-CM | POA: Diagnosis not present

## 2016-08-25 DIAGNOSIS — Z7409 Other reduced mobility: Secondary | ICD-10-CM | POA: Diagnosis not present

## 2016-08-25 DIAGNOSIS — H3530 Unspecified macular degeneration: Secondary | ICD-10-CM | POA: Diagnosis not present

## 2016-08-25 DIAGNOSIS — I129 Hypertensive chronic kidney disease with stage 1 through stage 4 chronic kidney disease, or unspecified chronic kidney disease: Secondary | ICD-10-CM | POA: Diagnosis not present

## 2016-08-25 DIAGNOSIS — S7290XA Unspecified fracture of unspecified femur, initial encounter for closed fracture: Secondary | ICD-10-CM | POA: Diagnosis not present

## 2016-08-25 DIAGNOSIS — N400 Enlarged prostate without lower urinary tract symptoms: Secondary | ICD-10-CM | POA: Diagnosis not present

## 2016-08-25 DIAGNOSIS — E785 Hyperlipidemia, unspecified: Secondary | ICD-10-CM | POA: Diagnosis not present

## 2016-08-25 DIAGNOSIS — M109 Gout, unspecified: Secondary | ICD-10-CM | POA: Diagnosis not present

## 2016-08-25 DIAGNOSIS — R251 Tremor, unspecified: Secondary | ICD-10-CM | POA: Diagnosis not present

## 2016-08-25 DIAGNOSIS — S72141D Displaced intertrochanteric fracture of right femur, subsequent encounter for closed fracture with routine healing: Secondary | ICD-10-CM | POA: Diagnosis not present

## 2016-08-25 DIAGNOSIS — S0081XA Abrasion of other part of head, initial encounter: Secondary | ICD-10-CM | POA: Diagnosis not present

## 2016-08-25 DIAGNOSIS — M25511 Pain in right shoulder: Secondary | ICD-10-CM | POA: Diagnosis not present

## 2016-08-25 DIAGNOSIS — S0990XA Unspecified injury of head, initial encounter: Secondary | ICD-10-CM | POA: Diagnosis not present

## 2016-08-26 DIAGNOSIS — N183 Chronic kidney disease, stage 3 (moderate): Secondary | ICD-10-CM | POA: Diagnosis not present

## 2016-08-26 DIAGNOSIS — Z9889 Other specified postprocedural states: Secondary | ICD-10-CM | POA: Insufficient documentation

## 2016-08-26 DIAGNOSIS — S72141A Displaced intertrochanteric fracture of right femur, initial encounter for closed fracture: Secondary | ICD-10-CM | POA: Diagnosis not present

## 2016-08-26 DIAGNOSIS — Z8673 Personal history of transient ischemic attack (TIA), and cerebral infarction without residual deficits: Secondary | ICD-10-CM | POA: Diagnosis not present

## 2016-08-26 DIAGNOSIS — I10 Essential (primary) hypertension: Secondary | ICD-10-CM | POA: Diagnosis not present

## 2016-08-26 DIAGNOSIS — N400 Enlarged prostate without lower urinary tract symptoms: Secondary | ICD-10-CM | POA: Diagnosis not present

## 2016-08-26 DIAGNOSIS — S72001A Fracture of unspecified part of neck of right femur, initial encounter for closed fracture: Secondary | ICD-10-CM | POA: Diagnosis not present

## 2016-08-26 DIAGNOSIS — M109 Gout, unspecified: Secondary | ICD-10-CM | POA: Diagnosis not present

## 2016-08-26 DIAGNOSIS — S72001D Fracture of unspecified part of neck of right femur, subsequent encounter for closed fracture with routine healing: Secondary | ICD-10-CM | POA: Diagnosis not present

## 2016-08-27 DIAGNOSIS — I10 Essential (primary) hypertension: Secondary | ICD-10-CM | POA: Diagnosis not present

## 2016-08-27 DIAGNOSIS — S72001A Fracture of unspecified part of neck of right femur, initial encounter for closed fracture: Secondary | ICD-10-CM | POA: Diagnosis not present

## 2016-08-27 DIAGNOSIS — M109 Gout, unspecified: Secondary | ICD-10-CM | POA: Diagnosis not present

## 2016-08-27 DIAGNOSIS — N183 Chronic kidney disease, stage 3 (moderate): Secondary | ICD-10-CM | POA: Diagnosis not present

## 2016-08-27 DIAGNOSIS — N400 Enlarged prostate without lower urinary tract symptoms: Secondary | ICD-10-CM | POA: Diagnosis not present

## 2016-08-27 DIAGNOSIS — Z8673 Personal history of transient ischemic attack (TIA), and cerebral infarction without residual deficits: Secondary | ICD-10-CM | POA: Diagnosis not present

## 2016-08-28 DIAGNOSIS — G629 Polyneuropathy, unspecified: Secondary | ICD-10-CM | POA: Diagnosis not present

## 2016-08-28 DIAGNOSIS — N183 Chronic kidney disease, stage 3 (moderate): Secondary | ICD-10-CM | POA: Diagnosis not present

## 2016-08-28 DIAGNOSIS — I951 Orthostatic hypotension: Secondary | ICD-10-CM | POA: Diagnosis not present

## 2016-08-28 DIAGNOSIS — Z8673 Personal history of transient ischemic attack (TIA), and cerebral infarction without residual deficits: Secondary | ICD-10-CM | POA: Diagnosis not present

## 2016-08-28 DIAGNOSIS — I10 Essential (primary) hypertension: Secondary | ICD-10-CM | POA: Diagnosis not present

## 2016-08-29 DIAGNOSIS — Z8673 Personal history of transient ischemic attack (TIA), and cerebral infarction without residual deficits: Secondary | ICD-10-CM | POA: Diagnosis not present

## 2016-08-29 DIAGNOSIS — I10 Essential (primary) hypertension: Secondary | ICD-10-CM | POA: Diagnosis not present

## 2016-08-29 DIAGNOSIS — I951 Orthostatic hypotension: Secondary | ICD-10-CM | POA: Diagnosis not present

## 2016-08-29 DIAGNOSIS — G629 Polyneuropathy, unspecified: Secondary | ICD-10-CM | POA: Diagnosis not present

## 2016-08-29 DIAGNOSIS — N183 Chronic kidney disease, stage 3 (moderate): Secondary | ICD-10-CM | POA: Diagnosis not present

## 2016-08-30 DIAGNOSIS — I10 Essential (primary) hypertension: Secondary | ICD-10-CM | POA: Diagnosis not present

## 2016-08-30 DIAGNOSIS — I951 Orthostatic hypotension: Secondary | ICD-10-CM | POA: Diagnosis not present

## 2016-08-30 DIAGNOSIS — N183 Chronic kidney disease, stage 3 (moderate): Secondary | ICD-10-CM | POA: Diagnosis not present

## 2016-08-30 DIAGNOSIS — G629 Polyneuropathy, unspecified: Secondary | ICD-10-CM | POA: Diagnosis not present

## 2016-08-30 DIAGNOSIS — Z8673 Personal history of transient ischemic attack (TIA), and cerebral infarction without residual deficits: Secondary | ICD-10-CM | POA: Diagnosis not present

## 2016-08-31 DIAGNOSIS — R1314 Dysphagia, pharyngoesophageal phase: Secondary | ICD-10-CM | POA: Diagnosis not present

## 2016-08-31 DIAGNOSIS — J209 Acute bronchitis, unspecified: Secondary | ICD-10-CM | POA: Diagnosis not present

## 2016-08-31 DIAGNOSIS — Z7982 Long term (current) use of aspirin: Secondary | ICD-10-CM | POA: Diagnosis not present

## 2016-08-31 DIAGNOSIS — S7291XD Unspecified fracture of right femur, subsequent encounter for closed fracture with routine healing: Secondary | ICD-10-CM | POA: Diagnosis not present

## 2016-08-31 DIAGNOSIS — Z7409 Other reduced mobility: Secondary | ICD-10-CM | POA: Diagnosis not present

## 2016-08-31 DIAGNOSIS — N183 Chronic kidney disease, stage 3 (moderate): Secondary | ICD-10-CM | POA: Diagnosis not present

## 2016-08-31 DIAGNOSIS — N400 Enlarged prostate without lower urinary tract symptoms: Secondary | ICD-10-CM | POA: Diagnosis not present

## 2016-08-31 DIAGNOSIS — Z09 Encounter for follow-up examination after completed treatment for conditions other than malignant neoplasm: Secondary | ICD-10-CM | POA: Diagnosis not present

## 2016-08-31 DIAGNOSIS — R109 Unspecified abdominal pain: Secondary | ICD-10-CM | POA: Diagnosis not present

## 2016-08-31 DIAGNOSIS — M109 Gout, unspecified: Secondary | ICD-10-CM | POA: Diagnosis not present

## 2016-08-31 DIAGNOSIS — I129 Hypertensive chronic kidney disease with stage 1 through stage 4 chronic kidney disease, or unspecified chronic kidney disease: Secondary | ICD-10-CM | POA: Diagnosis not present

## 2016-08-31 DIAGNOSIS — R0989 Other specified symptoms and signs involving the circulatory and respiratory systems: Secondary | ICD-10-CM | POA: Diagnosis not present

## 2016-08-31 DIAGNOSIS — R05 Cough: Secondary | ICD-10-CM | POA: Diagnosis not present

## 2016-08-31 DIAGNOSIS — M6281 Muscle weakness (generalized): Secondary | ICD-10-CM | POA: Diagnosis not present

## 2016-08-31 DIAGNOSIS — W19XXXD Unspecified fall, subsequent encounter: Secondary | ICD-10-CM | POA: Diagnosis not present

## 2016-08-31 DIAGNOSIS — J40 Bronchitis, not specified as acute or chronic: Secondary | ICD-10-CM | POA: Diagnosis not present

## 2016-08-31 DIAGNOSIS — S72141D Displaced intertrochanteric fracture of right femur, subsequent encounter for closed fracture with routine healing: Secondary | ICD-10-CM | POA: Diagnosis not present

## 2016-08-31 DIAGNOSIS — R251 Tremor, unspecified: Secondary | ICD-10-CM | POA: Diagnosis not present

## 2016-08-31 DIAGNOSIS — R1310 Dysphagia, unspecified: Secondary | ICD-10-CM | POA: Diagnosis not present

## 2016-08-31 DIAGNOSIS — G459 Transient cerebral ischemic attack, unspecified: Secondary | ICD-10-CM | POA: Diagnosis not present

## 2016-08-31 DIAGNOSIS — Z8673 Personal history of transient ischemic attack (TIA), and cerebral infarction without residual deficits: Secondary | ICD-10-CM | POA: Diagnosis not present

## 2016-08-31 DIAGNOSIS — K5790 Diverticulosis of intestine, part unspecified, without perforation or abscess without bleeding: Secondary | ICD-10-CM | POA: Diagnosis not present

## 2016-08-31 DIAGNOSIS — R509 Fever, unspecified: Secondary | ICD-10-CM | POA: Diagnosis not present

## 2016-08-31 DIAGNOSIS — R0602 Shortness of breath: Secondary | ICD-10-CM | POA: Diagnosis not present

## 2016-08-31 DIAGNOSIS — E785 Hyperlipidemia, unspecified: Secondary | ICD-10-CM | POA: Diagnosis not present

## 2016-08-31 DIAGNOSIS — W19XXXA Unspecified fall, initial encounter: Secondary | ICD-10-CM | POA: Diagnosis not present

## 2016-08-31 DIAGNOSIS — I639 Cerebral infarction, unspecified: Secondary | ICD-10-CM | POA: Diagnosis not present

## 2016-08-31 DIAGNOSIS — I10 Essential (primary) hypertension: Secondary | ICD-10-CM | POA: Diagnosis not present

## 2016-08-31 DIAGNOSIS — M1990 Unspecified osteoarthritis, unspecified site: Secondary | ICD-10-CM | POA: Diagnosis not present

## 2016-08-31 DIAGNOSIS — K224 Dyskinesia of esophagus: Secondary | ICD-10-CM | POA: Diagnosis not present

## 2016-08-31 DIAGNOSIS — G629 Polyneuropathy, unspecified: Secondary | ICD-10-CM | POA: Diagnosis not present

## 2016-08-31 DIAGNOSIS — J310 Chronic rhinitis: Secondary | ICD-10-CM | POA: Diagnosis not present

## 2016-08-31 DIAGNOSIS — Z9981 Dependence on supplemental oxygen: Secondary | ICD-10-CM | POA: Diagnosis not present

## 2016-09-01 DIAGNOSIS — R0602 Shortness of breath: Secondary | ICD-10-CM | POA: Diagnosis not present

## 2016-09-04 DIAGNOSIS — R109 Unspecified abdominal pain: Secondary | ICD-10-CM | POA: Diagnosis not present

## 2016-09-05 DIAGNOSIS — N183 Chronic kidney disease, stage 3 (moderate): Secondary | ICD-10-CM | POA: Diagnosis not present

## 2016-09-05 DIAGNOSIS — J310 Chronic rhinitis: Secondary | ICD-10-CM | POA: Diagnosis not present

## 2016-09-05 DIAGNOSIS — S72141D Displaced intertrochanteric fracture of right femur, subsequent encounter for closed fracture with routine healing: Secondary | ICD-10-CM | POA: Diagnosis not present

## 2016-09-05 DIAGNOSIS — M1990 Unspecified osteoarthritis, unspecified site: Secondary | ICD-10-CM | POA: Diagnosis not present

## 2016-09-05 DIAGNOSIS — I639 Cerebral infarction, unspecified: Secondary | ICD-10-CM | POA: Diagnosis not present

## 2016-09-05 DIAGNOSIS — G629 Polyneuropathy, unspecified: Secondary | ICD-10-CM | POA: Diagnosis not present

## 2016-09-05 DIAGNOSIS — J209 Acute bronchitis, unspecified: Secondary | ICD-10-CM | POA: Diagnosis not present

## 2016-09-05 DIAGNOSIS — G459 Transient cerebral ischemic attack, unspecified: Secondary | ICD-10-CM | POA: Diagnosis not present

## 2016-09-05 DIAGNOSIS — W19XXXA Unspecified fall, initial encounter: Secondary | ICD-10-CM | POA: Diagnosis not present

## 2016-09-05 DIAGNOSIS — R05 Cough: Secondary | ICD-10-CM | POA: Diagnosis not present

## 2016-09-13 DIAGNOSIS — R1310 Dysphagia, unspecified: Secondary | ICD-10-CM | POA: Diagnosis not present

## 2016-09-13 DIAGNOSIS — Z8673 Personal history of transient ischemic attack (TIA), and cerebral infarction without residual deficits: Secondary | ICD-10-CM | POA: Diagnosis not present

## 2016-09-14 DIAGNOSIS — Z09 Encounter for follow-up examination after completed treatment for conditions other than malignant neoplasm: Secondary | ICD-10-CM | POA: Diagnosis not present

## 2016-09-17 DIAGNOSIS — R1310 Dysphagia, unspecified: Secondary | ICD-10-CM | POA: Diagnosis not present

## 2016-09-17 DIAGNOSIS — I10 Essential (primary) hypertension: Secondary | ICD-10-CM | POA: Diagnosis not present

## 2016-09-17 DIAGNOSIS — E785 Hyperlipidemia, unspecified: Secondary | ICD-10-CM | POA: Diagnosis not present

## 2016-09-17 DIAGNOSIS — K5790 Diverticulosis of intestine, part unspecified, without perforation or abscess without bleeding: Secondary | ICD-10-CM | POA: Diagnosis not present

## 2016-09-17 DIAGNOSIS — M109 Gout, unspecified: Secondary | ICD-10-CM | POA: Diagnosis not present

## 2016-09-17 DIAGNOSIS — K224 Dyskinesia of esophagus: Secondary | ICD-10-CM | POA: Diagnosis not present

## 2016-09-19 ENCOUNTER — Telehealth: Payer: Self-pay | Admitting: *Deleted

## 2016-09-19 NOTE — Telephone Encounter (Signed)
Patient wife, Inez Catalina called and stated that patient fell and broke his hip and is at a nursing home. Was at [facility] and fell in the parking lot. Had surgery. He started going down hill a couple days after surgery. The hospital had given him a BP medication and he went down hill after that. He's not able to eat now. He is at [facility] in [place]. She stated that he has been a patient of yours for a long time and she would really like to speak with you to get your personal opinion about some things. She wants to explain to you. Would like for you to call [phone]

## 2016-09-21 ENCOUNTER — Other Ambulatory Visit: Payer: Self-pay | Admitting: Internal Medicine

## 2016-09-21 NOTE — Telephone Encounter (Signed)
Discussed multiple issues with his wife. Facing major decision about continued NH placement vs home care.

## 2016-10-12 DIAGNOSIS — S72141D Displaced intertrochanteric fracture of right femur, subsequent encounter for closed fracture with routine healing: Secondary | ICD-10-CM | POA: Diagnosis not present

## 2016-10-24 ENCOUNTER — Ambulatory Visit: Payer: Medicare Other | Admitting: Internal Medicine

## 2016-10-24 DIAGNOSIS — S7291XD Unspecified fracture of right femur, subsequent encounter for closed fracture with routine healing: Secondary | ICD-10-CM | POA: Diagnosis not present

## 2016-10-24 DIAGNOSIS — Z9981 Dependence on supplemental oxygen: Secondary | ICD-10-CM | POA: Diagnosis not present

## 2016-10-24 DIAGNOSIS — R509 Fever, unspecified: Secondary | ICD-10-CM | POA: Diagnosis not present

## 2016-10-24 DIAGNOSIS — J40 Bronchitis, not specified as acute or chronic: Secondary | ICD-10-CM | POA: Diagnosis not present

## 2016-10-24 DIAGNOSIS — W19XXXD Unspecified fall, subsequent encounter: Secondary | ICD-10-CM | POA: Diagnosis not present

## 2016-10-24 DIAGNOSIS — R0989 Other specified symptoms and signs involving the circulatory and respiratory systems: Secondary | ICD-10-CM | POA: Diagnosis not present

## 2016-10-24 DIAGNOSIS — R05 Cough: Secondary | ICD-10-CM | POA: Diagnosis not present

## 2016-10-24 DIAGNOSIS — J310 Chronic rhinitis: Secondary | ICD-10-CM | POA: Diagnosis not present

## 2016-10-25 DIAGNOSIS — N183 Chronic kidney disease, stage 3 (moderate): Secondary | ICD-10-CM | POA: Diagnosis not present

## 2016-10-25 DIAGNOSIS — W19XXXD Unspecified fall, subsequent encounter: Secondary | ICD-10-CM | POA: Diagnosis not present

## 2016-10-25 DIAGNOSIS — Z8673 Personal history of transient ischemic attack (TIA), and cerebral infarction without residual deficits: Secondary | ICD-10-CM | POA: Diagnosis not present

## 2016-10-25 DIAGNOSIS — S72001A Fracture of unspecified part of neck of right femur, initial encounter for closed fracture: Secondary | ICD-10-CM | POA: Diagnosis not present

## 2016-10-25 DIAGNOSIS — M1990 Unspecified osteoarthritis, unspecified site: Secondary | ICD-10-CM | POA: Diagnosis not present

## 2016-10-25 DIAGNOSIS — R1314 Dysphagia, pharyngoesophageal phase: Secondary | ICD-10-CM | POA: Diagnosis not present

## 2016-10-25 DIAGNOSIS — Z9889 Other specified postprocedural states: Secondary | ICD-10-CM | POA: Diagnosis not present

## 2016-10-25 DIAGNOSIS — S72009A Fracture of unspecified part of neck of unspecified femur, initial encounter for closed fracture: Secondary | ICD-10-CM | POA: Diagnosis not present

## 2016-10-27 DIAGNOSIS — M1000 Idiopathic gout, unspecified site: Secondary | ICD-10-CM | POA: Diagnosis not present

## 2016-10-27 DIAGNOSIS — M21371 Foot drop, right foot: Secondary | ICD-10-CM | POA: Diagnosis not present

## 2016-10-27 DIAGNOSIS — M21372 Foot drop, left foot: Secondary | ICD-10-CM | POA: Diagnosis not present

## 2016-10-27 DIAGNOSIS — M6281 Muscle weakness (generalized): Secondary | ICD-10-CM | POA: Diagnosis not present

## 2016-10-30 DIAGNOSIS — R1314 Dysphagia, pharyngoesophageal phase: Secondary | ICD-10-CM | POA: Diagnosis not present

## 2016-10-30 DIAGNOSIS — L89610 Pressure ulcer of right heel, unstageable: Secondary | ICD-10-CM | POA: Diagnosis not present

## 2016-10-30 DIAGNOSIS — I129 Hypertensive chronic kidney disease with stage 1 through stage 4 chronic kidney disease, or unspecified chronic kidney disease: Secondary | ICD-10-CM

## 2016-10-30 DIAGNOSIS — N183 Chronic kidney disease, stage 3 (moderate): Secondary | ICD-10-CM

## 2016-11-01 DIAGNOSIS — R1314 Dysphagia, pharyngoesophageal phase: Secondary | ICD-10-CM | POA: Diagnosis not present

## 2016-11-01 DIAGNOSIS — M21371 Foot drop, right foot: Secondary | ICD-10-CM | POA: Diagnosis not present

## 2016-11-01 DIAGNOSIS — L89610 Pressure ulcer of right heel, unstageable: Secondary | ICD-10-CM | POA: Diagnosis not present

## 2016-11-01 DIAGNOSIS — I129 Hypertensive chronic kidney disease with stage 1 through stage 4 chronic kidney disease, or unspecified chronic kidney disease: Secondary | ICD-10-CM | POA: Diagnosis not present

## 2016-11-01 DIAGNOSIS — N183 Chronic kidney disease, stage 3 (moderate): Secondary | ICD-10-CM | POA: Diagnosis not present

## 2016-11-01 DIAGNOSIS — S72141D Displaced intertrochanteric fracture of right femur, subsequent encounter for closed fracture with routine healing: Secondary | ICD-10-CM | POA: Diagnosis not present

## 2016-11-01 DIAGNOSIS — M21372 Foot drop, left foot: Secondary | ICD-10-CM | POA: Diagnosis not present

## 2016-11-01 DIAGNOSIS — M109 Gout, unspecified: Secondary | ICD-10-CM | POA: Diagnosis not present

## 2016-11-01 DIAGNOSIS — G629 Polyneuropathy, unspecified: Secondary | ICD-10-CM | POA: Diagnosis not present

## 2016-11-01 DIAGNOSIS — M1991 Primary osteoarthritis, unspecified site: Secondary | ICD-10-CM | POA: Diagnosis not present

## 2016-11-14 ENCOUNTER — Ambulatory Visit (INDEPENDENT_AMBULATORY_CARE_PROVIDER_SITE_OTHER): Payer: Medicare Other | Admitting: Internal Medicine

## 2016-11-14 ENCOUNTER — Encounter: Payer: Self-pay | Admitting: Internal Medicine

## 2016-11-14 VITALS — BP 132/80 | HR 67 | Temp 97.6°F

## 2016-11-14 DIAGNOSIS — L89612 Pressure ulcer of right heel, stage 2: Secondary | ICD-10-CM

## 2016-11-14 DIAGNOSIS — I10 Essential (primary) hypertension: Secondary | ICD-10-CM

## 2016-11-14 DIAGNOSIS — S72301D Unspecified fracture of shaft of right femur, subsequent encounter for closed fracture with routine healing: Secondary | ICD-10-CM | POA: Diagnosis not present

## 2016-11-14 DIAGNOSIS — G250 Essential tremor: Secondary | ICD-10-CM

## 2016-11-14 DIAGNOSIS — S7291XA Unspecified fracture of right femur, initial encounter for closed fracture: Secondary | ICD-10-CM | POA: Insufficient documentation

## 2016-11-14 DIAGNOSIS — R1310 Dysphagia, unspecified: Secondary | ICD-10-CM | POA: Diagnosis not present

## 2016-11-14 DIAGNOSIS — L89609 Pressure ulcer of unspecified heel, unspecified stage: Secondary | ICD-10-CM | POA: Insufficient documentation

## 2016-11-14 DIAGNOSIS — G63 Polyneuropathy in diseases classified elsewhere: Secondary | ICD-10-CM

## 2016-11-14 MED ORDER — COLLAGENASE 250 UNIT/GM EX OINT: 1.0000 "application " | TOPICAL_OINTMENT | Freq: Every day | CUTANEOUS | 0 refills | Status: DC

## 2016-11-14 MED ORDER — PRIMIDONE 50 MG PO TABS: ORAL_TABLET | ORAL | 3 refills | Status: DC

## 2016-11-14 NOTE — Progress Notes (Signed)
Facility  [facility]    Place of Service:   OFFICE    Allergies  Allergen Reactions  . Aleve [Naproxen Sodium]     Upset stomach   . Nsaids     Bother his stomach  . Antihistamines, Diphenhydramine-Type     Causes urinary issues    Chief Complaint  Patient presents with  . Follow-up    2 week follow-up from rehab ([facility] in [place]) 08/31/16-10/29/16  . Medication Management    Discuss if patient should take ASA 325 mg, Primidone was d/c'ed at rehab-? if patient to remain off medication   . Medication Refill    Refill flomax   . Foot Problem    Examine pressure sore on bottom of right foot x 3-4 weeks. No drainage present   . FYI    Receiving home health services through [facility]     HPI:  Hospitalized 08/25/16 to 08/31/16 for right hip fracture and trochanteric nail fixation. Subsequently, went to SNF for rehab. Returned home.  Experienced some swallowing difficulty. Had Barium Esophagogram on 09/14/16 that showed:  Distal esophageal dysmotility with only intermittent relaxation of the lower esophageal sphincter which caused pooling of contrast, tertiary activity and lodging of the barium tablet.  Doing well now. On regular food. Regained weight that he lost.  Primidone was stopped and hs tremor is worse.  ASA was stated along with his Plavix. He asks if he needs both.  Medications: Patient's Medications  New Prescriptions   No medications on file  Previous Medications   ACETAMINOPHEN (TYLENOL) 500 MG TABLET    Take 500-1,000 mg by mouth every 6 (six) hours as needed.   ALLOPURINOL (ZYLOPRIM) 100 MG TABLET    TAKE ONE TABLET BY MOUTH TWICE DAILY TO PREVENT GOUT.   B COMPLEX VITAMINS CAPSULE    Take 1 capsule by mouth daily.    CHOLECALCIFEROL (VITAMIN D3) 2000 UNITS TABS    Take by mouth daily.   CLOPIDOGREL (PLAVIX) 75 MG TABLET    TAKE ONE TABLET BY MOUTH ONCE DAILY FOR  ANTICOAGULATION   FAMOTIDINE (PEPCID) 20 MG TABLET    Take 20 mg by mouth 2 (two)  times daily.   FINASTERIDE (PROSCAR) 5 MG TABLET    One daily to help shrink prostate.   MULTIPLE VITAMINS-MINERALS (OCUVITE ADULT 50+ PO)    Take by mouth.   TAMSULOSIN (FLOMAX) 0.4 MG CAPS CAPSULE    Take 0.4 mg by mouth daily. To improve urination  Modified Medications   No medications on file  Discontinued Medications   ASPIRIN 325 MG TABLET    Take by mouth.   PRIMIDONE (MYSOLINE) 50 MG TABLET    TAKE ONE TABLET IN THE EVENING TO HELP WITH TREMORS.    Review of Systems  Constitutional: Positive for fatigue. Negative for fever.  HENT:       Light headed and dizzy feelings  Eyes: Positive for visual disturbance (corrective lenses).  Respiratory: Positive for cough.   Cardiovascular: Negative for chest pain, palpitations and leg swelling.  Gastrointestinal: Positive for diarrhea. Negative for abdominal pain and blood in stool.       Episodes of aspiration have improved. Swallowing has been affected by his neuro muscular disease. Occasional problems with loose stools.  Endocrine: Negative.   Genitourinary: Positive for difficulty urinating and frequency.       Issues with nocturia and urgency. Rising PSA.  Musculoskeletal:       S/P left hip fx.2014 and right hip fx  Nov 2017. Chronic bilateral foot drop. Uses AFO regularly inside his shoe.  Skin: Negative.   Neurological:       Polyneuropathy. Bilateral foot drop.  Hematological:       Hx Anemia.  Psychiatric/Behavioral: Negative.     Vitals:   11/14/16 1249  BP: 132/80  Pulse: 67  Temp: 97.6 F (36.4 C)  TempSrc: Oral  SpO2: 90%   There is no height or weight on file to calculate BMI. Wt Readings from Last 3 Encounters:  06/06/16 169 lb (76.7 kg)  02/08/16 170 lb (77.1 kg)  09/28/15 168 lb 3.2 oz (76.3 kg)      Physical Exam  Constitutional: He is oriented to person, place, and time.  Thin, frail   HENT:  Head: Normocephalic and atraumatic.  Right Ear: External ear normal.  Left Ear: External ear normal.    Nose: Nose normal.  Eyes: Conjunctivae and EOM are normal.  Mild erythema of the right eye.  Neck: No JVD present. No tracheal deviation present. No thyromegaly present.  Cardiovascular: Normal rate, regular rhythm, normal heart sounds and intact distal pulses.  Exam reveals no gallop and no friction rub.   No murmur heard. Pulmonary/Chest: No respiratory distress. He has no wheezes. He has no rales. He exhibits no tenderness.  Slight cough during today's exam.  Abdominal: He exhibits no distension and no mass. There is no tenderness.  Genitourinary: Rectum normal. Rectal exam shows guaiac negative stool.  Musculoskeletal: He exhibits no edema or tenderness.  Prior left hip fx. Healed incision. Unstable gait. Using walker.Muscular wasting of hand muscles and forearm muscles and lower leg muscles. Contractures of all fingers in both hands. Tender in the interscapular area and in the upper pectoral muscles of the chest.  Lymphadenopathy:    He has no cervical adenopathy.  Neurological: He is alert and oriented to person, place, and time. No cranial nerve deficit. Coordination abnormal.  Bilateral foot drop. Tremor. Generalized weakness based on neuromuscular disease.  Skin: No rash noted. No erythema. No pallor.  Healed scar right hip 1 x 1.5 inch right heel decubitus with black leathery eschar.  Psychiatric: He has a normal mood and affect. His behavior is normal. Judgment and thought content normal.    Labs reviewed: Lab Summary Latest Ref Rng & Units 06/01/2016 02/06/2016 09/27/2015 02/22/2015  Hemoglobin 12.6 - 17.7 g/dL (None) (None) 13.5 12.9  Hematocrit 37.5 - 51.0 % (None) (None) 40.4 38.9  White count 3.4 - 10.8 x10E3/uL (None) (None) 7.2 6.3  Platelet count 150 - 379 x10E3/uL (None) (None) 173 175  Sodium 135 - 146 mmol/L 142 142 139 137  Potassium 3.5 - 5.3 mmol/L 4.6 4.6 4.9 4.7  Calcium 8.6 - 10.3 mg/dL 8.9 9.3 9.0 9.2  Phosphorus - (None) (None) (None) (None)  Creatinine  0.70 - 1.11 mg/dL 1.63(H) 1.67(H) 1.33(H) 1.47(H)  AST 0 - 40 IU/L (None) (None) 20 15  Alk Phos 39 - 117 IU/L (None) (None) 86 70  Bilirubin 0.0 - 1.2 mg/dL (None) (None) 0.3 0.3  Glucose 65 - 99 mg/dL 80 85 80 84  Cholesterol - (None) (None) (None) (None)  HDL cholesterol >39 mg/dL (None) (None) 55 60  Triglycerides 0 - 149 mg/dL (None) (None) 120 99  LDL Direct - (None) (None) (None) (None)  LDL Calc 0 - 99 mg/dL (None) (None) 207(H) 199(H)  Total protein - (None) (None) (None) (None)  Albumin 3.5 - 4.7 g/dL (None) (None) 3.5 3.8  Some recent data might be  hidden   No results found for: TSH, T3TOTAL, T4TOTAL, THYROIDAB Lab Results  Component Value Date   BUN 36 (H) 06/01/2016   BUN 40 (H) 02/06/2016   BUN 26 09/27/2015   No results found for: HGBA1C  Assessment/Plan  1. Decubitus ulcer of right heel, stage 2 - collagenase (SANTYL) ointment; Apply 1 application topically daily.  Dispense: 15 g; Refill: 0  2. Dysphagia, unspecified type improved  3. Essential hypertension controlled  4. Closed fracture of shaft of right femur with routine healing, unspecified fracture morphology, subsequent encounter Healing. Pain controlled  5. Benign essential tremor Resume primidone - primidone (MYSOLINE) 50 MG tablet; TAKE ONE TABLET IN THE EVENING TO HELP WITH TREMORS.  Dispense: 90 tablet; Refill: 3  6. Polyneuropathy in other diseases classified elsewhere ([place]) Unchanged. Muscular wasting. Weak and poor balance.

## 2016-11-19 DIAGNOSIS — L89610 Pressure ulcer of right heel, unstageable: Secondary | ICD-10-CM | POA: Diagnosis not present

## 2016-11-25 DIAGNOSIS — S72009A Fracture of unspecified part of neck of unspecified femur, initial encounter for closed fracture: Secondary | ICD-10-CM | POA: Diagnosis not present

## 2016-11-25 DIAGNOSIS — Z9889 Other specified postprocedural states: Secondary | ICD-10-CM | POA: Diagnosis not present

## 2016-11-25 DIAGNOSIS — Z8673 Personal history of transient ischemic attack (TIA), and cerebral infarction without residual deficits: Secondary | ICD-10-CM | POA: Diagnosis not present

## 2016-11-25 DIAGNOSIS — S72001A Fracture of unspecified part of neck of right femur, initial encounter for closed fracture: Secondary | ICD-10-CM | POA: Diagnosis not present

## 2016-11-27 DIAGNOSIS — I10 Essential (primary) hypertension: Secondary | ICD-10-CM | POA: Diagnosis not present

## 2016-11-27 DIAGNOSIS — M21371 Foot drop, right foot: Secondary | ICD-10-CM | POA: Diagnosis not present

## 2016-11-27 DIAGNOSIS — Z8781 Personal history of (healed) traumatic fracture: Secondary | ICD-10-CM | POA: Diagnosis not present

## 2016-11-27 DIAGNOSIS — Z9889 Other specified postprocedural states: Secondary | ICD-10-CM | POA: Diagnosis not present

## 2016-11-27 DIAGNOSIS — M21372 Foot drop, left foot: Secondary | ICD-10-CM | POA: Diagnosis not present

## 2016-11-27 DIAGNOSIS — Z969 Presence of functional implant, unspecified: Secondary | ICD-10-CM | POA: Diagnosis not present

## 2016-11-27 DIAGNOSIS — M1000 Idiopathic gout, unspecified site: Secondary | ICD-10-CM | POA: Diagnosis not present

## 2016-11-27 DIAGNOSIS — M6281 Muscle weakness (generalized): Secondary | ICD-10-CM | POA: Diagnosis not present

## 2016-12-03 ENCOUNTER — Other Ambulatory Visit: Payer: Medicare Other

## 2016-12-03 DIAGNOSIS — E785 Hyperlipidemia, unspecified: Secondary | ICD-10-CM | POA: Diagnosis not present

## 2016-12-03 DIAGNOSIS — M1A9XX0 Chronic gout, unspecified, without tophus (tophi): Secondary | ICD-10-CM

## 2016-12-03 DIAGNOSIS — I10 Essential (primary) hypertension: Secondary | ICD-10-CM | POA: Diagnosis not present

## 2016-12-03 LAB — COMPREHENSIVE METABOLIC PANEL
ALBUMIN: 3.4 g/dL — AB (ref 3.6–5.1)
ALT: 10 U/L (ref 9–46)
AST: 19 U/L (ref 10–35)
Alkaline Phosphatase: 70 U/L (ref 40–115)
BILIRUBIN TOTAL: 0.4 mg/dL (ref 0.2–1.2)
BUN: 33 mg/dL — AB (ref 7–25)
CO2: 29 mmol/L (ref 20–31)
CREATININE: 1.56 mg/dL — AB (ref 0.70–1.11)
Calcium: 9.5 mg/dL (ref 8.6–10.3)
Chloride: 102 mmol/L (ref 98–110)
Glucose, Bld: 81 mg/dL (ref 65–99)
Potassium: 5 mmol/L (ref 3.5–5.3)
SODIUM: 138 mmol/L (ref 135–146)
TOTAL PROTEIN: 6.7 g/dL (ref 6.1–8.1)

## 2016-12-03 LAB — LIPID PANEL
CHOLESTEROL: 279 mg/dL — AB (ref <200)
HDL: 62 mg/dL (ref >40)
LDL Cholesterol: 195 mg/dL — ABNORMAL HIGH (ref <100)
Total CHOL/HDL Ratio: 4.5 Ratio (ref <5.0)
Triglycerides: 109 mg/dL (ref <150)
VLDL: 22 mg/dL (ref <30)

## 2016-12-03 LAB — URIC ACID: Uric Acid, Serum: 6.8 mg/dL (ref 4.0–8.0)

## 2016-12-05 ENCOUNTER — Encounter: Payer: Self-pay | Admitting: Internal Medicine

## 2016-12-05 ENCOUNTER — Ambulatory Visit (INDEPENDENT_AMBULATORY_CARE_PROVIDER_SITE_OTHER): Payer: Medicare Other | Admitting: Internal Medicine

## 2016-12-05 VITALS — BP 118/72 | HR 75 | Temp 97.4°F | Ht 68.0 in | Wt 165.0 lb

## 2016-12-05 DIAGNOSIS — G250 Essential tremor: Secondary | ICD-10-CM

## 2016-12-05 DIAGNOSIS — Z23 Encounter for immunization: Secondary | ICD-10-CM | POA: Diagnosis not present

## 2016-12-05 DIAGNOSIS — L89612 Pressure ulcer of right heel, stage 2: Secondary | ICD-10-CM | POA: Diagnosis not present

## 2016-12-05 DIAGNOSIS — I10 Essential (primary) hypertension: Secondary | ICD-10-CM

## 2016-12-05 DIAGNOSIS — R269 Unspecified abnormalities of gait and mobility: Secondary | ICD-10-CM

## 2016-12-05 MED ORDER — TAMSULOSIN HCL 0.4 MG PO CAPS: 0.4000 mg | ORAL_CAPSULE | Freq: Every day | ORAL | 5 refills | Status: DC

## 2016-12-05 NOTE — Addendum Note (Signed)
Addended by: Logan Bores on: 12/05/2016 03:36 PM   Modules accepted: Orders

## 2016-12-05 NOTE — Patient Instructions (Signed)
Obtain Tush Cush or similar device to use when sitting.

## 2016-12-05 NOTE — Addendum Note (Signed)
Addended by: Leigh Aurora C on: 12/05/2016 03:00 PM   Modules accepted: Orders

## 2016-12-05 NOTE — Progress Notes (Signed)
Facility  [place]    Place of Service:   OFFICE    Allergies  Allergen Reactions  . Aleve [Naproxen Sodium]     Upset stomach   . Nsaids     Bother his stomach  . Antihistamines, Diphenhydramine-Type     Causes urinary issues    Chief Complaint  Patient presents with  . Medical Management of Chronic Issues    6 month follow-up on tremours and right heel ulcer. Discuss labs (copy printed)   . Medication Refill    No refills needed on current medications   . Medication Management    Discuss New RX for Flomax  . Immunizations    Discuss Prevnar 13 with Dr.Green     HPI:  Decubitus ulcer of right heel, stage 2 - improving on Santyl  Benign essential tremor - mildly improved with resumption of primidone  Abnormality of gait - slowly getting better  Essential hypertension - controlled    Medications: Patient's Medications  New Prescriptions   No medications on file  Previous Medications   ACETAMINOPHEN (TYLENOL) 500 MG TABLET    Take 500-1,000 mg by mouth every 6 (six) hours as needed.   ALLOPURINOL (ZYLOPRIM) 100 MG TABLET    TAKE ONE TABLET BY MOUTH TWICE DAILY TO PREVENT GOUT.   B COMPLEX VITAMINS CAPSULE    Take 1 capsule by mouth daily.    CHOLECALCIFEROL (VITAMIN D3) 2000 UNITS TABS    Take by mouth daily.   CLOPIDOGREL (PLAVIX) 75 MG TABLET    TAKE ONE TABLET BY MOUTH ONCE DAILY FOR  ANTICOAGULATION   COLLAGENASE (SANTYL) OINTMENT    Apply 1 application topically daily.   FAMOTIDINE (PEPCID) 20 MG TABLET    Take 20 mg by mouth 2 (two) times daily.   FINASTERIDE (PROSCAR) 5 MG TABLET    One daily to help shrink prostate.   MULTIPLE VITAMINS-MINERALS (OCUVITE ADULT 50+ PO)    Take by mouth.   PRIMIDONE (MYSOLINE) 50 MG TABLET    TAKE ONE TABLET IN THE EVENING TO HELP WITH TREMORS.  Modified Medications   No medications on file  Discontinued Medications   No medications on file    Review of Systems  Constitutional: Positive for fatigue. Negative for fever.   HENT:       Light headed and dizzy feelings  Eyes: Positive for visual disturbance (corrective lenses).  Respiratory: Positive for cough.   Cardiovascular: Negative for chest pain, palpitations and leg swelling.  Gastrointestinal: Positive for diarrhea. Negative for abdominal pain and blood in stool.       Episodes of aspiration have improved. Swallowing has been affected by his neuro muscular disease. Occasional problems with loose stools.  Endocrine: Negative.   Genitourinary: Positive for difficulty urinating and frequency.       Issues with nocturia and urgency. Rising PSA.  Musculoskeletal:       S/P left hip fx.2014 and right hip fx Nov 2017. Chronic bilateral foot drop. Uses AFO regularly inside his shoe.  Skin:       Healing ulcer of the right heel.  Neurological:       Polyneuropathy. Bilateral foot drop.  Hematological:       Hx Anemia.  Psychiatric/Behavioral: Negative.     Vitals:   12/05/16 1315  BP: 118/72  Pulse: 75  Temp: 97.4 F (36.3 C)  TempSrc: Oral  SpO2: 93%  Weight: 165 lb (74.8 kg)  Height: 5' 8" (1.727 m)   Body mass  index is 25.09 kg/m. Wt Readings from Last 3 Encounters:  12/05/16 165 lb (74.8 kg)  06/06/16 169 lb (76.7 kg)  02/08/16 170 lb (77.1 kg)      Physical Exam  Constitutional: He is oriented to person, place, and time.  Thin, frail   HENT:  Head: Normocephalic and atraumatic.  Right Ear: External ear normal.  Left Ear: External ear normal.  Nose: Nose normal.  Eyes: Conjunctivae and EOM are normal.  Mild erythema of the right eye.  Neck: No JVD present. No tracheal deviation present. No thyromegaly present.  Cardiovascular: Normal rate, regular rhythm, normal heart sounds and intact distal pulses.  Exam reveals no gallop and no friction rub.   No murmur heard. Pulmonary/Chest: No respiratory distress. He has no wheezes. He has no rales. He exhibits no tenderness.  Slight cough during today's exam.  Abdominal: He  exhibits no distension and no mass. There is no tenderness.  Genitourinary: Rectum normal. Rectal exam shows guaiac negative stool.  Musculoskeletal: He exhibits no edema or tenderness.  Prior left hip fx. Healed incision. Unstable gait. Using walker.Muscular wasting of hand muscles and forearm muscles and lower leg muscles. Contractures of all fingers in both hands. Tender in the interscapular area and in the upper pectoral muscles of the chest.  Lymphadenopathy:    He has no cervical adenopathy.  Neurological: He is alert and oriented to person, place, and time. No cranial nerve deficit. Coordination abnormal.  Bilateral foot drop. Tremor. Generalized weakness based on neuromuscular disease.  Skin: No rash noted. No erythema. No pallor.  Healed scar right hip 1 x 1.5 inch right heel decubitus with black leathery eschar is improving.  Psychiatric: He has a normal mood and affect. His behavior is normal. Judgment and thought content normal.    Labs reviewed: Lab Summary Latest Ref Rng & Units 12/03/2016 06/01/2016 02/06/2016 09/27/2015  Hemoglobin 12.6 - 17.7 g/dL (None) (None) (None) 13.5  Hematocrit 37.5 - 51.0 % (None) (None) (None) 40.4  White count 3.4 - 10.8 x10E3/uL (None) (None) (None) 7.2  Platelet count 150 - 379 x10E3/uL (None) (None) (None) 173  Sodium 135 - 146 mmol/L 138 142 142 139  Potassium 3.5 - 5.3 mmol/L 5.0 4.6 4.6 4.9  Calcium 8.6 - 10.3 mg/dL 9.5 8.9 9.3 9.0  Phosphorus - (None) (None) (None) (None)  Creatinine 0.70 - 1.11 mg/dL 1.56(H) 1.63(H) 1.67(H) 1.33(H)  AST 10 - 35 U/L 19 (None) (None) 20  Alk Phos 40 - 115 U/L 70 (None) (None) 86  Bilirubin 0.2 - 1.2 mg/dL 0.4 (None) (None) 0.3  Glucose 65 - 99 mg/dL 81 80 85 80  Cholesterol <200 mg/dL 279(H) (None) (None) (None)  HDL cholesterol >40 mg/dL 62 (None) (None) 55  Triglycerides <150 mg/dL 109 (None) (None) 120  LDL Direct - (None) (None) (None) (None)  LDL Calc <100 mg/dL 195(H) (None) (None) 207(H)  Total  protein 6.1 - 8.1 g/dL 6.7 (None) (None) (None)  Albumin 3.6 - 5.1 g/dL 3.4(L) (None) (None) 3.5  Some recent data might be hidden   No results found for: TSH, T3TOTAL, T4TOTAL, THYROIDAB Lab Results  Component Value Date   BUN 33 (H) 12/03/2016   BUN 36 (H) 06/01/2016   BUN 40 (H) 02/06/2016   No results found for: HGBA1C  Assessment/Plan  1. Decubitus ulcer of right heel, stage 2 improved  2. Benign essential tremor stable to slightly I mroved  3. Abnormality of gait Slightly improved  4. Essential hypertension controlled

## 2016-12-12 ENCOUNTER — Telehealth: Payer: Self-pay | Admitting: *Deleted

## 2016-12-12 ENCOUNTER — Other Ambulatory Visit: Payer: Self-pay | Admitting: Internal Medicine

## 2016-12-12 DIAGNOSIS — M1A9XX0 Chronic gout, unspecified, without tophus (tophi): Secondary | ICD-10-CM

## 2016-12-12 DIAGNOSIS — G250 Essential tremor: Secondary | ICD-10-CM

## 2016-12-12 NOTE — Telephone Encounter (Signed)
Patient wife, Inez Catalina called and stated that you saw patient last week for sore on heel. It is better but it is now in the weeping stage. No redness, sore a lot smaller. Crust around it about ready to turn loose.  No fever.   Wife wonders if there is anything different she should be doing for this. Please Advise.

## 2016-12-12 NOTE — Telephone Encounter (Signed)
Patient wife notified and agreed.  

## 2016-12-12 NOTE — Telephone Encounter (Signed)
Nothing different needs to be done. Continue current treatments.

## 2016-12-25 ENCOUNTER — Other Ambulatory Visit: Payer: Self-pay | Admitting: Internal Medicine

## 2016-12-25 DIAGNOSIS — M21371 Foot drop, right foot: Secondary | ICD-10-CM | POA: Diagnosis not present

## 2016-12-25 DIAGNOSIS — M21372 Foot drop, left foot: Secondary | ICD-10-CM | POA: Diagnosis not present

## 2016-12-25 DIAGNOSIS — M6281 Muscle weakness (generalized): Secondary | ICD-10-CM | POA: Diagnosis not present

## 2016-12-25 DIAGNOSIS — M1000 Idiopathic gout, unspecified site: Secondary | ICD-10-CM | POA: Diagnosis not present

## 2016-12-29 DIAGNOSIS — M21372 Foot drop, left foot: Secondary | ICD-10-CM | POA: Diagnosis not present

## 2016-12-29 DIAGNOSIS — M21371 Foot drop, right foot: Secondary | ICD-10-CM | POA: Diagnosis not present

## 2016-12-29 DIAGNOSIS — L89610 Pressure ulcer of right heel, unstageable: Secondary | ICD-10-CM | POA: Diagnosis not present

## 2016-12-29 DIAGNOSIS — R1314 Dysphagia, pharyngoesophageal phase: Secondary | ICD-10-CM | POA: Diagnosis not present

## 2016-12-29 DIAGNOSIS — G629 Polyneuropathy, unspecified: Secondary | ICD-10-CM | POA: Diagnosis not present

## 2016-12-29 DIAGNOSIS — I129 Hypertensive chronic kidney disease with stage 1 through stage 4 chronic kidney disease, or unspecified chronic kidney disease: Secondary | ICD-10-CM | POA: Diagnosis not present

## 2016-12-29 DIAGNOSIS — M109 Gout, unspecified: Secondary | ICD-10-CM | POA: Diagnosis not present

## 2016-12-29 DIAGNOSIS — M1990 Unspecified osteoarthritis, unspecified site: Secondary | ICD-10-CM | POA: Diagnosis not present

## 2016-12-29 DIAGNOSIS — S72141D Displaced intertrochanteric fracture of right femur, subsequent encounter for closed fracture with routine healing: Secondary | ICD-10-CM | POA: Diagnosis not present

## 2016-12-29 DIAGNOSIS — N183 Chronic kidney disease, stage 3 (moderate): Secondary | ICD-10-CM | POA: Diagnosis not present

## 2016-12-31 ENCOUNTER — Other Ambulatory Visit: Payer: Self-pay | Admitting: Internal Medicine

## 2016-12-31 DIAGNOSIS — R972 Elevated prostate specific antigen [PSA]: Secondary | ICD-10-CM

## 2017-01-01 ENCOUNTER — Telehealth: Payer: Self-pay | Admitting: *Deleted

## 2017-01-01 NOTE — Telephone Encounter (Signed)
Wife, Inez Catalina called and stated that [facility] needs new orders to continue care. I called [facility] and spoke with Loma Sousa [phone] and she will give a message to Mardene Celeste, her Publishing copy to call me back. Awaiting call.

## 2017-01-02 NOTE — Telephone Encounter (Signed)
Called and left message on Courtney's voicemail at [facility] stating no one has returned my call and I need a call back regarding patient's care.

## 2017-01-02 NOTE — Telephone Encounter (Signed)
Courtney with [facility] called back and stated that they are seeing patient today and they have received orders.

## 2017-01-03 ENCOUNTER — Encounter: Payer: Self-pay | Admitting: Nurse Practitioner

## 2017-01-03 ENCOUNTER — Ambulatory Visit (INDEPENDENT_AMBULATORY_CARE_PROVIDER_SITE_OTHER): Payer: Medicare Other | Admitting: Nurse Practitioner

## 2017-01-03 ENCOUNTER — Telehealth: Payer: Self-pay

## 2017-01-03 VITALS — BP 132/78 | HR 86 | Temp 97.8°F | Resp 17 | Ht 68.0 in | Wt 149.8 lb

## 2017-01-03 DIAGNOSIS — L89610 Pressure ulcer of right heel, unstageable: Secondary | ICD-10-CM | POA: Diagnosis not present

## 2017-01-03 DIAGNOSIS — S72141D Displaced intertrochanteric fracture of right femur, subsequent encounter for closed fracture with routine healing: Secondary | ICD-10-CM | POA: Diagnosis not present

## 2017-01-03 DIAGNOSIS — N183 Chronic kidney disease, stage 3 (moderate): Secondary | ICD-10-CM | POA: Diagnosis not present

## 2017-01-03 DIAGNOSIS — M1990 Unspecified osteoarthritis, unspecified site: Secondary | ICD-10-CM | POA: Diagnosis not present

## 2017-01-03 DIAGNOSIS — M21372 Foot drop, left foot: Secondary | ICD-10-CM | POA: Diagnosis not present

## 2017-01-03 DIAGNOSIS — I129 Hypertensive chronic kidney disease with stage 1 through stage 4 chronic kidney disease, or unspecified chronic kidney disease: Secondary | ICD-10-CM | POA: Diagnosis not present

## 2017-01-03 DIAGNOSIS — M21371 Foot drop, right foot: Secondary | ICD-10-CM | POA: Diagnosis not present

## 2017-01-03 DIAGNOSIS — R1314 Dysphagia, pharyngoesophageal phase: Secondary | ICD-10-CM | POA: Diagnosis not present

## 2017-01-03 DIAGNOSIS — L89613 Pressure ulcer of right heel, stage 3: Secondary | ICD-10-CM | POA: Diagnosis not present

## 2017-01-03 DIAGNOSIS — G629 Polyneuropathy, unspecified: Secondary | ICD-10-CM | POA: Diagnosis not present

## 2017-01-03 DIAGNOSIS — M109 Gout, unspecified: Secondary | ICD-10-CM | POA: Diagnosis not present

## 2017-01-03 MED ORDER — SILVER SULFADIAZINE 1 % EX CREA: 1.0000 "application " | TOPICAL_CREAM | Freq: Every day | CUTANEOUS | 0 refills | Status: DC

## 2017-01-03 NOTE — Progress Notes (Signed)
Careteam: Patient Care Team: [name], MD as PCP - General (Internal Medicine)  Advanced Directive information Does Patient Have a Medical Advance Directive?: Yes, Type of Advance Directive: [place];Living will  Allergies  Allergen Reactions  . Aleve [Naproxen Sodium]     Upset stomach   . Nsaids     Bother his stomach  . Antihistamines, Diphenhydramine-Type     Causes urinary issues    Chief Complaint  Patient presents with  . Acute Visit    Pt has a pressure sore on his right heel that has been there since Dec 2017. Painful to stand on, no drainage.      HPI: Patient is a 83 y.o. male seen in the office today to have right heel wound evaluated. Pt was last seen by Dr Nyoka Cowden in March 2017 due to stage 2 heel wound. Wife at visit today. Feels like it is doing good and improving. Reports she feel like it is time to change from santyl. Also would like to go to wound care clinic.  Eschar has resolved, now with green/gray base with slight drainage and odor, no erythema to surrounding tissue.  [facility] following [facility] and helping with dressing changes.   Review of Systems:  Review of Systems  Musculoskeletal:         S/P left hip fx.2014 and right hip fx Nov 2017. Chronic bilateral foot drop. Uses AFO regularly inside his shoe.   Skin:       Heeling right heel ulcer    Past Medical History:  Diagnosis Date  . Abnormality of gait   . Benign neoplasm of colon   . Chronic kidney disease, stage III (moderate)   . Complication of anesthesia    postop headache after general anesthesia  . Degenerative arthritis   . Diverticulosis of colon (without mention of hemorrhage)   . Duodenal ulcer, unspecified as acute or chronic, without hemorrhage, perforation, or obstruction   . Encounter for long-term (current) use of other medications   . Enlarged prostate    takes Tamsulosin daily  . Essential and other specified forms of tremor   . Foley  catheter in place 09-01-13   4 months now  . Foot drop, bilateral   . Gait disorder    balance issues-uses walker or mobile chair.  . Gout    takes Allopurinol daily  . Gout, unspecified   . Hyperlipidemia    but doesn't require meds  . Hypertension    takes Enalapril daily   . Inguinal hernia without mention of obstruction or gangrene, unilateral or unspecified, (not specified as recurrent)   . Internal hemorrhoids without mention of complication   . Macular degeneration    takes eye cap vits daily  . Other abnormal blood chemistry   . Peripheral neuropathy ([place])   . Peripheral neuropathy ([place])   . Pneumonia    hx of;last time 8'14-mild.  . Ptosis    Left side  . Spinal stenosis, lumbar region, with neurogenic claudication   . TIA (transient ischemic attack)    sees Dr.Willis-neurologist 6'14(strange feeling of head)-no residual  . Unspecified vitamin D deficiency   . Urinary frequency   . Urinary urgency   . UTI (lower urinary tract infection) 09-01-13   tx. 2 weeks ago   Past Surgical History:  Procedure Laterality Date  . CATARACT EXTRACTION Right 08/31/2014   Dr.Lyles  . colonosocpy    . FINGER SURGERY Left    Left  ring finger  . HERNIA REPAIR     at least 23yrs ago  . HIP PINNING,CANNULATED Left 05/05/2013   Procedure: CANNULATED HIP PINNING- left;  Surgeon: Renette Butters, MD;  Location: [facility];  Service: Orthopedics;  Laterality: Left;  . INGUINAL HERNIA REPAIR  09/05/2012   Procedure: LAPAROSCOPIC INGUINAL HERNIA;  Surgeon: Gayland Curry, MD,FACS;  Location: [facility];  Service: General;  Laterality: Left;  . INSERTION OF MESH  09/05/2012   Procedure: INSERTION OF MESH;  Surgeon: Gayland Curry, MD,FACS;  Location: [facility];  Service: General;  Laterality: Left;  . left leg surgery  2011  . NERVE BIOPSY    . TRANSURETHRAL RESECTION OF PROSTATE N/A 09/07/2013   Procedure: CYSTO TRANSURETHRAL RESECTION OF THE PROSTATE (TURP);  Surgeon: Reece Packer, MD;  Location: [facility];  Service: Urology;  Laterality: N/A;   Social History:   reports that he quit smoking about 31 years ago. His smoking use included Cigarettes. He quit smokeless tobacco use about 15 years ago. He reports that he does not drink alcohol or use drugs.  Family History  Problem Relation Age of Onset  . Diabetes Mother     type 2  . Clotting disorder Mother     deceased, blood clot  . Leukemia Father   . COPD Brother   . Lung cancer Sister   . Heart disease Brother     1/2 brother    Medications: Patient's Medications  New Prescriptions   No medications on file  Previous Medications   ACETAMINOPHEN (TYLENOL) 500 MG TABLET    Take 500-1,000 mg by mouth every 6 (six) hours as needed.   ALLOPURINOL (ZYLOPRIM) 100 MG TABLET    TAKE ONE TABLET BY MOUTH TWICE DAILY TO  PREVENT  GOUT   B COMPLEX VITAMINS CAPSULE    Take 1 capsule by mouth daily.    CHOLECALCIFEROL (VITAMIN D3) 2000 UNITS TABS    Take by mouth daily.   CLOPIDOGREL (PLAVIX) 75 MG TABLET    TAKE ONE TABLET BY MOUTH ONCE DAILY FOR ANTICOAGULATION   COLLAGENASE (SANTYL) OINTMENT    Apply 1 application topically daily.   FAMOTIDINE (PEPCID) 20 MG TABLET    Take 20 mg by mouth 2 (two) times daily.   FINASTERIDE (PROSCAR) 5 MG TABLET    TAKE ONE TABLET BY MOUTH ONCE DAILY TO HELP SHRINK PROSTATE.   MULTIPLE VITAMINS-MINERALS (OCUVITE ADULT 50+ PO)    Take by mouth.   PRIMIDONE (MYSOLINE) 50 MG TABLET    TAKE ONE TABLET BY MOUTH ONCE DAILY IN THE EVENING TO HELP WITH TREMORS.   TAMSULOSIN (FLOMAX) 0.4 MG CAPS CAPSULE    Take 1 capsule (0.4 mg total) by mouth daily.  Modified Medications   No medications on file  Discontinued Medications   No medications on file     Physical Exam:  Vitals:   01/03/17 1146  BP: 132/78  Pulse: 86  Resp: 17  Temp: 97.8 F (36.6 C)  TempSrc: Oral  SpO2: 98%  Weight: 149 lb 12.8 oz (67.9 kg)  Height: 5\' 8"  (1.727 m)   Body mass index is 22.78 kg/m.  Physical Exam  Constitutional:  He is oriented to person, place, and time. He appears well-developed and well-nourished.  Neurological: He is alert and oriented to person, place, and time.  Skin: Skin is warm and dry.  1 x 3  inch right heel decubitus with green/gray base with  Pink tissue surrounding. Odor present No  erythema noted to surrounding tissue     Labs reviewed: Basic Metabolic Panel:  Recent Labs  02/06/16 1012 06/01/16 1039 12/03/16 0908  NA 142 142 138  K 4.6 4.6 5.0  CL 101 107 102  CO2 24 25 29   GLUCOSE 85 80 81  BUN 40* 36* 33*  CREATININE 1.67* 1.63* 1.56*  CALCIUM 9.3 8.9 9.5   Liver Function Tests:  Recent Labs  12/03/16 0908  AST 19  ALT 10  ALKPHOS 70  BILITOT 0.4  PROT 6.7  ALBUMIN 3.4*   No results for input(s): LIPASE, AMYLASE in the last 8760 hours. No results for input(s): AMMONIA in the last 8760 hours. CBC: No results for input(s): WBC, NEUTROABS, HGB, HCT, MCV, PLT in the last 8760 hours. Lipid Panel:  Recent Labs  12/03/16 0908  CHOL 279*  HDL 62  LDLCALC 195*  TRIG 109  CHOLHDL 4.5   TSH: No results for input(s): TSH in the last 8760 hours. A1C: No results found for: HGBA1C   Assessment/Plan 1. Pressure ulcer of right heel, stage 3 (HCC) Improving. May stop santyl- eschar has resolved.  - silver sulfADIAZINE (SILVADENE) 1 % cream; Apply 1 application topically daily.  Dispense: 50 g; Refill: 0 -home health nursing to cont to follow  - AMB referral to wound care center for ongoing follow up   [place]. Harle Battiest  [facility] [phone] 8 am - 5 pm) [phone] (after hours)

## 2017-01-03 NOTE — Telephone Encounter (Signed)
I called patient's wife to let her know that [place] asked that patient come in for a follow up in 2 weeks if patient has not been scheduled by the wound care center. Mrs. Drew Gibson did schedule a follow up appointment for 01/29/17 but stated that she would call and cancel this appointment if they could get in with wound care before then.   I called [facility] at [phone] with new home health orders per [name]. [facility] was notified that medication has been changed to silvadene. Home Health nurse visit should be 3 times per week to clean, dress, and wrap patient's right heel pressure ulcer.

## 2017-01-03 NOTE — Patient Instructions (Addendum)
Apply silvadene to wound daily- three times a week then cover with dressing. Cont to have home health nursing to follow.  Okay to stop santyl at this time  Pressure Injury A pressure injury, sometimes called a bedsore, is an injury to the skin and underlying tissue caused by pressure. Pressure on blood vessels causes decreased blood flow to the skin, which can eventually cause the skin tissue to die and break down into a wound. Pressure injuries usually occur:  Over bony parts of the body such as the tailbone, shoulders, elbows, hips, and heels.  Under medical devices such as respiratory equipment, stockings, tubes, and splints. Pressure injuries start as reddened areas on the skin and can lead to pain, muscle damage, and infection. Pressure injuries can vary in severity. What are the causes? Pressure injuries are caused by a lack of blood supply to an area of skin. They can occur from intense pressure over a short period of time or from less intense pressure over a long period of time. What increases the risk? This condition is more likely to develop in people who:  Are in the hospital or an extended care facility.  Are bedridden or in a wheelchair.  Have an injury or disease that keeps them from:  Moving normally.  Feeling pain or pressure.  Have a condition that:  Makes them sleepy or less alert.  Causes poor blood flow.  Need to wear a medical device.  Have poor control of their bladder or bowel functions (incontinence).  Have poor nutrition (malnutrition).  Are of certain ethnicities. People of African American and Latino or Hispanic descent are at higher risk compared to other ethnic groups. If you are at risk for pressure ulcers, your health care provider may recommend certain types of bedding to help prevent them. These may include foam or gel mattresses covered with one of the following:  A sheepskin blanket.  A pad that is filled with gel, air, water, or  foam. What are the signs or symptoms? The main symptom is a blister or change in skin color that opens into a wound. Other symptoms include:  Red or dark areas of skin that do not turn white or pale when pressed with a finger.  Pain, warmth, or change of skin texture. How is this diagnosed? This condition is diagnosed with a medical history and physical exam. You may also have tests, including:  Blood tests to check for infection or signs of poor nutrition.  Imaging studies to check for damage to the deep tissues under your skin.  Blood flow studies. Your pressure injury will be staged to determine its severity. Staging is an assessment of:  The depth of the pressure injury.  Which tissues are exposed because of the pressure injury.  The causes of the pressure injury. How is this treated? The main focus of treatment is to help your injury heal. This may be done by:  Relieving or redistributing pressure on your skin. This includes:  Frequently changing your position.  Eliminating or minimizing positions that caused the wound or that can make the wound worse.  Using specific bed mattresses and chair cushions.  Refitting, resizing, or replacing any medical devices, or padding the skin under them.  Using creams or powders to prevent rubbing (friction) on the skin.  Keeping your skin clean and dry. This may include using a skin cleanser or skin protectant as told by your health care provider. This may be a lotion, ointment, or spray.  Cleaning  your injury and removing any dead tissue from the wound (debridement).  Placing a bandage (dressing) over your injury.  Preventing or treating infection. This may include antibiotic, antimicrobial, or antiseptic medicines. Treatment may also include medicine for pain. Sometimes surgery is needed to close the wound with a flap of healthy skin or a piece of skin from another area of your body (graft). You may need surgery if other  treatments are not working or if your injury is very deep. Follow these instructions at home: Wound care   Follow instructions from your health care provider about:  How to take care of your wound.  When and how you should change your dressing.  When you should remove your dressing. If your dressing is dry and stuck when you try to remove it, moisten or wet the dressing with saline or water so that it can be removed without harming your skin or wound tissue.  Check your wound every day for signs of infection. Have a caregiver do this for you if you are not able. Watch for:  More redness, swelling, or pain.  More fluid, blood, or pus.  A bad smell. Skin Care   Keep your skin clean and dry. Gently pat your skin dry.  Do not rub or massage your skin.  Use a skin protectant only as told by your health care provider.  Check your skin every day for any changes in color or any new blisters or sores (ulcers). Have a caregiver do this for you if you are not able. Medicines   Take over-the-counter and prescription medicines only as told by your health care provider.  If you were prescribed an antibiotic medicine, take it or apply it as told by your health care provider. Do not stop taking or using the antibiotic even if your condition improves. Reducing and Redistributing Pressure   Do not lie or sit in one position for a long time. Move or change position every two hours or as told by your health care provider.  Use pillows or cushions to reduce pressure. Ask your health care provider to recommend cushions or pads for you.  Use medical devices that do not rub your skin. Tell your health care provider if one of your medical devices is causing a pressure injury to develop. General instructions    Eat a healthy diet that includes lots of protein. Ask your health care provider for diet advice.  Drink enough fluid to keep your urine clear or pale yellow.  Be as active as you can  every day. Ask your health care provider to suggest safe exercises or activities.  Do not abuse drugs or alcohol.  Keep all follow-up visits as told by your health care provider. This is important.  Do not smoke. Contact a health care provider if:   You have chills or fever.  Your pain medicine is not helping.  You have any changes in skin color.  You have new blisters or sores.  You develop warmth, redness, or swelling near a pressure injury.  You have a bad odor or pus coming from your pressure injury.  You lose control of your bowels or bladder.  You develop new symptoms.  Your wound does not improve after 1-2 weeks of treatment.  You develop a new medical condition, such as diabetes, peripheral vascular disease, or conditions that affect your defense (immune) system. This information is not intended to replace advice given to you by your health care provider. Make sure you  discuss any questions you have with your health care provider. Document Released: 09/17/2005 Document Revised: 02/20/2016 Document Reviewed: 01/26/2015 Elsevier Interactive Patient Education  2017 [employer].

## 2017-01-10 DIAGNOSIS — M79671 Pain in right foot: Secondary | ICD-10-CM | POA: Diagnosis not present

## 2017-01-10 DIAGNOSIS — B351 Tinea unguium: Secondary | ICD-10-CM | POA: Diagnosis not present

## 2017-01-10 DIAGNOSIS — L84 Corns and callosities: Secondary | ICD-10-CM | POA: Diagnosis not present

## 2017-01-10 DIAGNOSIS — M79672 Pain in left foot: Secondary | ICD-10-CM | POA: Diagnosis not present

## 2017-01-14 ENCOUNTER — Ambulatory Visit (INDEPENDENT_AMBULATORY_CARE_PROVIDER_SITE_OTHER): Payer: Medicare Other | Admitting: Internal Medicine

## 2017-01-14 ENCOUNTER — Encounter: Payer: Self-pay | Admitting: Internal Medicine

## 2017-01-14 VITALS — BP 130/80 | HR 89 | Temp 97.8°F | Wt 166.0 lb

## 2017-01-14 DIAGNOSIS — L89613 Pressure ulcer of right heel, stage 3: Secondary | ICD-10-CM

## 2017-01-14 DIAGNOSIS — R059 Cough, unspecified: Secondary | ICD-10-CM

## 2017-01-14 DIAGNOSIS — J301 Allergic rhinitis due to pollen: Secondary | ICD-10-CM

## 2017-01-14 DIAGNOSIS — R05 Cough: Secondary | ICD-10-CM | POA: Diagnosis not present

## 2017-01-14 NOTE — Patient Instructions (Signed)
Continue current cough syrup.  Unfortunately, your prostate and current mobility issues limit treatment options.  Right heel wound looks good.  No changes.

## 2017-01-14 NOTE — Progress Notes (Signed)
Location:  [facility] clinic Provider: Jazzy Parmer L. Mariea Clonts, D.O., C.M.D. PCP:  Dr. Nyoka Cowden  Goals of Care:  Advanced Directives 01/03/2017  Does Patient Have a Medical Advance Directive? Yes  Type of Paramedic of [place];Living will  Does patient want to make changes to medical advance directive? -  Copy of [place] in Chart? No - copy requested  Pre-existing out of facility DNR order (yellow form or pink MOST form) -     Chief Complaint  Patient presents with  . Acute Visit    cough x1week    HPI: Patient is a 83 y.o. male seen today for an acute visit for cough for one week.  Thinks maybe allergies from waiting in the car last week with windows open while his wife got her hair done.  He's recovering from hip replacement surgery.  Sats wnl.  RR normal.  No chest pain.  His wife reports that made the cough worse.  No fever chills or mucus production.  Feels well.  No watery eyes.  Has done some sneezing.   Has pressure sore on right heel, stage 3.    Home since 1/29 and being treated since then.  Santyl had dissolved the crust.     On silver sulfadiazine for it now.  Still has some serosanguinous drainage.  Odor better.  Past Medical History:  Diagnosis Date  . Abnormality of gait   . Benign neoplasm of colon   . Chronic kidney disease, stage III (moderate)   . Complication of anesthesia    postop headache after general anesthesia  . Degenerative arthritis   . Diverticulosis of colon (without mention of hemorrhage)   . Duodenal ulcer, unspecified as acute or chronic, without hemorrhage, perforation, or obstruction   . Encounter for long-term (current) use of other medications   . Enlarged prostate    takes Tamsulosin daily  . Essential and other specified forms of tremor   . Foley catheter in place 09-01-13   4 months now  . Foot drop, bilateral   . Gait disorder    balance issues-uses walker or mobile chair.  . Gout    takes Allopurinol  daily  . Gout, unspecified   . Hyperlipidemia    but doesn't require meds  . Hypertension    takes Enalapril daily   . Inguinal hernia without mention of obstruction or gangrene, unilateral or unspecified, (not specified as recurrent)   . Internal hemorrhoids without mention of complication   . Macular degeneration    takes eye cap vits daily  . Other abnormal blood chemistry   . Peripheral neuropathy   . Peripheral neuropathy   . Pneumonia    hx of;last time 8'14-mild.  . Ptosis    Left side  . Spinal stenosis, lumbar region, with neurogenic claudication   . TIA (transient ischemic attack)    sees Dr.Willis-neurologist 6'14(strange feeling of head)-no residual  . Unspecified vitamin D deficiency   . Urinary frequency   . Urinary urgency   . UTI (lower urinary tract infection) 09-01-13   tx. 2 weeks ago    Past Surgical History:  Procedure Laterality Date  . CATARACT EXTRACTION Right 08/31/2014   Dr.Lyles  . colonosocpy    . FINGER SURGERY Left    Left ring finger  . HERNIA REPAIR     at least 49yrs ago  . HIP PINNING,CANNULATED Left 05/05/2013   Procedure: CANNULATED HIP PINNING- left;  Surgeon: Renette Butters, MD;  Location: [facility];  Service: Orthopedics;  Laterality: Left;  . INGUINAL HERNIA REPAIR  09/05/2012   Procedure: LAPAROSCOPIC INGUINAL HERNIA;  Surgeon: Gayland Curry, MD,FACS;  Location: [facility];  Service: General;  Laterality: Left;  . INSERTION OF MESH  09/05/2012   Procedure: INSERTION OF MESH;  Surgeon: Gayland Curry, MD,FACS;  Location: [facility];  Service: General;  Laterality: Left;  . left leg surgery  2011  . NERVE BIOPSY    . TRANSURETHRAL RESECTION OF PROSTATE N/A 09/07/2013   Procedure: CYSTO TRANSURETHRAL RESECTION OF THE PROSTATE (TURP);  Surgeon: Reece Packer, MD;  Location: [facility];  Service: Urology;  Laterality: N/A;    Allergies  Allergen Reactions  . Aleve [Naproxen Sodium]     Upset stomach   . Nsaids     Bother his stomach  .  Antihistamines, Diphenhydramine-Type     Causes urinary issues    Allergies as of 01/14/2017      Reactions   Aleve [naproxen Sodium]    Upset stomach    Nsaids    Bother his stomach   Antihistamines, Diphenhydramine-type    Causes urinary issues      Medication List       Accurate as of 01/14/17 11:57 AM. Always use your most recent med list.          acetaminophen 500 MG tablet Commonly known as:  TYLENOL Take 500-1,000 mg by mouth every 6 (six) hours as needed.   allopurinol 100 MG tablet Commonly known as:  ZYLOPRIM TAKE ONE TABLET BY MOUTH TWICE DAILY TO  PREVENT  GOUT   b complex vitamins capsule Take 1 capsule by mouth daily.   clopidogrel 75 MG tablet Commonly known as:  PLAVIX TAKE ONE TABLET BY MOUTH ONCE DAILY FOR ANTICOAGULATION   famotidine 20 MG tablet Commonly known as:  PEPCID Take 20 mg by mouth 2 (two) times daily.   finasteride 5 MG tablet Commonly known as:  PROSCAR TAKE ONE TABLET BY MOUTH ONCE DAILY TO HELP SHRINK PROSTATE.   OCUVITE ADULT 50+ PO Take by mouth.   primidone 50 MG tablet Commonly known as:  MYSOLINE TAKE ONE TABLET BY MOUTH ONCE DAILY IN THE EVENING TO HELP WITH TREMORS.   silver sulfADIAZINE 1 % cream Commonly known as:  SILVADENE Apply 1 application topically daily.   tamsulosin 0.4 MG Caps capsule Commonly known as:  FLOMAX Take 1 capsule (0.4 mg total) by mouth daily.   Vitamin D3 2000 units Tabs Take by mouth daily.       Review of Systems:  Review of Systems  Constitutional: Negative for chills and fever.  HENT: Negative for congestion, sinus pain and sore throat.        Sneezing  Eyes: Negative for blurred vision.       Glasses  Respiratory: Positive for cough. Negative for sputum production and shortness of breath.   Cardiovascular: Negative for chest pain, palpitations and leg swelling.  Gastrointestinal: Negative for abdominal pain.  Genitourinary: Negative for dysuria.  Musculoskeletal: Negative  for falls.  Skin: Negative for itching and rash.       Pressure ulcer heel  Neurological: Positive for tremors and weakness.    Health Maintenance  Topic Date Due  . INFLUENZA VACCINE  05/01/2017  . PNA vac Low Risk Adult (2 of 2 - PPSV23) 12/05/2017  . TETANUS/TDAP  08/25/2026    Physical Exam: Vitals:   01/14/17 1147  BP: 130/80  Pulse: 89  Temp: 97.8  F (36.6 C)  TempSrc: Oral  SpO2: 95%  Weight: 166 lb (75.3 kg)   Body mass index is 25.24 kg/m. Physical Exam  Constitutional: He is oriented to person, place, and time. No distress.  HENT:  Nose: Nose normal.  Mouth/Throat: Oropharynx is clear and moist. No oropharyngeal exudate.  Eyes: Conjunctivae and EOM are normal. Pupils are equal, round, and reactive to light.  Neck: Neck supple.  Cardiovascular: Normal rate, regular rhythm, normal heart sounds and intact distal pulses.   Pulmonary/Chest: Effort normal and breath sounds normal. No respiratory distress. He has no wheezes. He has no rales.  Abdominal: Bowel sounds are normal.  Musculoskeletal: Normal range of motion.  Came in wheelchair  Lymphadenopathy:    He has no cervical adenopathy.  Neurological: He is alert and oriented to person, place, and time.  Skin: Skin is warm and dry.  Nickel-sized ulcer of heel with pink base, serosanguinous drainage, odor resolved    Labs reviewed: Basic Metabolic Panel:  Recent Labs  02/06/16 1012 06/01/16 1039 12/03/16 0908  NA 142 142 138  K 4.6 4.6 5.0  CL 101 107 102  CO2 24 25 29   GLUCOSE 85 80 81  BUN 40* 36* 33*  CREATININE 1.67* 1.63* 1.56*  CALCIUM 9.3 8.9 9.5   Liver Function Tests:  Recent Labs  12/03/16 0908  AST 19  ALT 10  ALKPHOS 70  BILITOT 0.4  PROT 6.7  ALBUMIN 3.4*   No results for input(s): LIPASE, AMYLASE in the last 8760 hours. No results for input(s): AMMONIA in the last 8760 hours. CBC: No results for input(s): WBC, NEUTROABS, HGB, HCT, MCV, PLT in the last 8760 hours. Lipid  Panel:  Recent Labs  12/03/16 0908  CHOL 279*  HDL 62  LDLCALC 195*  TRIG 109  CHOLHDL 4.5   Assessment/Plan 1. Cough -seems to be related to allergies, improving gradually  2. Seasonal allergic rhinitis due to pollen -discussed conservative measures, we opted not to add meds due to his prostate problems and risks of confusion, dry mouth, falls when he is just recovering from hip surgery  3. Pressure ulcer of right heel, stage 3 ([place]) -improved based on history I'd gotten from NP [place], cont silver sulfadiazine and dressing to keep clean and dry, getting home care nursing and has plans to f/u with podiatry  Labs/tests ordered:  No orders of the defined types were placed in this encounter.   Next appt:  01/29/2017  Vishwa Dais L. Senovia Gauer, D.O. [facility] [address], [place] [address] Cell Phone (Mon-Fri 8am-5pm):  [phone] On Call:  [phone] & follow prompts after 5pm & weekends Office Phone:  [phone] Office Fax:  [phone]

## 2017-01-16 ENCOUNTER — Other Ambulatory Visit: Payer: Self-pay | Admitting: Internal Medicine

## 2017-01-16 ENCOUNTER — Telehealth: Payer: Self-pay | Admitting: *Deleted

## 2017-01-16 DIAGNOSIS — R05 Cough: Secondary | ICD-10-CM

## 2017-01-16 DIAGNOSIS — R059 Cough, unspecified: Secondary | ICD-10-CM

## 2017-01-16 MED ORDER — HYDROCOD POLST-CPM POLST ER 10-8 MG/5ML PO SUER: ORAL | 0 refills | Status: DC

## 2017-01-16 NOTE — Telephone Encounter (Signed)
Patient called and stated that he had just seen Dr. Mariea Clonts on 01/14/17 for cough and has been doing what she recommended but it is not working. He stated his cough is no better. Stated he does not feel bad and no fever. Stated that Dr. Nyoka Cowden has treated this in the past and wants to know if Dr. Nyoka Cowden will prescribe something to help his cough. Non Productive. Wife stated she wasn't sure what could be prescribed with his Prostate and Kidneys. Please Advise.

## 2017-01-16 NOTE — Progress Notes (Signed)
tussionex

## 2017-01-16 NOTE — Telephone Encounter (Signed)
Rx printed and left up front. Patient notified and will pick up

## 2017-01-16 NOTE — Telephone Encounter (Signed)
I ordered Tussionex.

## 2017-01-18 ENCOUNTER — Encounter (HOSPITAL_BASED_OUTPATIENT_CLINIC_OR_DEPARTMENT_OTHER): Payer: Medicare Other | Attending: Internal Medicine

## 2017-01-18 DIAGNOSIS — L89893 Pressure ulcer of other site, stage 3: Secondary | ICD-10-CM | POA: Diagnosis not present

## 2017-01-22 ENCOUNTER — Telehealth: Payer: Self-pay | Admitting: Internal Medicine

## 2017-01-22 NOTE — Telephone Encounter (Signed)
Patient's wife, Inez Catalina called concerned about patient. Inez Catalina stated that he was recently seen in the office one week ago due to a cough. He was prescribed Tussionex and has been taking it as instructed.   The coughing has increased to being more frequent and more like wheezing. Inez Catalina is concerned and wants to know what exactly they should do to control it. She did state that the patient is not in any pain.   Please advise.

## 2017-01-22 NOTE — Telephone Encounter (Signed)
He will need to get PA and lateral CXR. I will recommend additional treatment once I get this report.

## 2017-01-23 ENCOUNTER — Encounter: Payer: Self-pay | Admitting: Internal Medicine

## 2017-01-23 ENCOUNTER — Ambulatory Visit (INDEPENDENT_AMBULATORY_CARE_PROVIDER_SITE_OTHER): Payer: Medicare Other | Admitting: Internal Medicine

## 2017-01-23 VITALS — BP 148/98 | HR 64 | Temp 97.5°F | Ht 68.0 in | Wt 166.0 lb

## 2017-01-23 DIAGNOSIS — I10 Essential (primary) hypertension: Secondary | ICD-10-CM | POA: Diagnosis not present

## 2017-01-23 DIAGNOSIS — J690 Pneumonitis due to inhalation of food and vomit: Secondary | ICD-10-CM

## 2017-01-23 DIAGNOSIS — R1310 Dysphagia, unspecified: Secondary | ICD-10-CM

## 2017-01-23 DIAGNOSIS — R05 Cough: Secondary | ICD-10-CM | POA: Diagnosis not present

## 2017-01-23 DIAGNOSIS — R059 Cough, unspecified: Secondary | ICD-10-CM

## 2017-01-23 MED ORDER — AMOXICILLIN-POT CLAVULANATE 875-125 MG PO TABS: ORAL_TABLET | ORAL | 0 refills | Status: DC

## 2017-01-23 NOTE — Telephone Encounter (Signed)
Patient didn't get chest x-ray prior to office visit. Did not get call back

## 2017-01-23 NOTE — Progress Notes (Signed)
Facility  [facility]    Place of Service:   OFFICE    Allergies  Allergen Reactions  . Aleve [Naproxen Sodium]     Upset stomach   . Nsaids     Bother his stomach  . Antihistamines, Diphenhydramine-Type     Causes urinary issues    Chief Complaint  Patient presents with  . Acute Visit    cough for 3 weeks, fever 99.6 last night.     HPI:   Cough - rattling chest and low grade fluctuating fever. Highly likely to be related to his dysphagia and recurrent aspiration.  Essential hypertension - Mildly elevated , but he is in some distress with cough at this time  Dysphagia, unspecified type - see result of Barium swallow. He has had evaluation and training with speech therapy. he could not stand the recommendations for puree and thickened foods.  Aspiration pneumonia, unspecified aspiration pneumonia type, unspecified laterality, unspecified part of lung (HCC)   Medications: Patient's Medications  New Prescriptions   No medications on file  Previous Medications   ACETAMINOPHEN (TYLENOL) 500 MG TABLET    Take 500-1,000 mg by mouth every 6 (six) hours as needed.   ALLOPURINOL (ZYLOPRIM) 100 MG TABLET    TAKE ONE TABLET BY MOUTH TWICE DAILY TO  PREVENT  GOUT   B COMPLEX VITAMINS CAPSULE    Take 1 capsule by mouth daily.    CHLORPHENIRAMINE-HYDROCODONE (TUSSIONEX PENNKINETIC ER) 10-8 MG/5ML SUER    5 cc every 12 hours if needed for cough   CHOLECALCIFEROL (VITAMIN D3) 2000 UNITS TABS    Take by mouth daily.   CLOPIDOGREL (PLAVIX) 75 MG TABLET    TAKE ONE TABLET BY MOUTH ONCE DAILY FOR ANTICOAGULATION   FAMOTIDINE (PEPCID) 20 MG TABLET    Take 20 mg by mouth 2 (two) times daily.   FINASTERIDE (PROSCAR) 5 MG TABLET    TAKE ONE TABLET BY MOUTH ONCE DAILY TO HELP SHRINK PROSTATE.   MULTIPLE VITAMINS-MINERALS (OCUVITE ADULT 50+ PO)    Take by mouth.   PRIMIDONE (MYSOLINE) 50 MG TABLET    TAKE ONE TABLET BY MOUTH ONCE DAILY IN THE EVENING TO HELP WITH TREMORS.   SILVER SULFADIAZINE  (SILVADENE) 1 % CREAM    Apply 1 application topically daily.   TAMSULOSIN (FLOMAX) 0.4 MG CAPS CAPSULE    Take 1 capsule (0.4 mg total) by mouth daily.  Modified Medications   No medications on file  Discontinued Medications   No medications on file    Review of Systems  Constitutional: Positive for fatigue. Negative for fever.  HENT:       Light headed and dizzy feelings  Eyes: Positive for visual disturbance (corrective lenses).  Respiratory: Positive for cough.   Cardiovascular: Negative for chest pain, palpitations and leg swelling.  Gastrointestinal: Negative for abdominal pain, blood in stool and diarrhea.       Episodes of aspiration. Swallowing has been affected by his neuro muscular disease. Occasional problems with loose stools.  Endocrine: Negative.   Genitourinary: Positive for difficulty urinating and frequency.       Issues with nocturia and urgency. Rising PSA.  Musculoskeletal:         S/P left hip fx.2014 and right hip fx Nov 2017. Chronic bilateral foot drop. Uses AFO regularly inside his shoe.   Skin:       Heeling right heel ulcer  Neurological:       Polyneuropathy. Bilateral foot drop.  Hematological:  Hx Anemia.  Psychiatric/Behavioral: Negative.     Vitals:   01/23/17 1607  BP: (!) 148/98  Pulse: 64  Temp: 97.5 F (36.4 C)  TempSrc: Oral  SpO2: 93%  Weight: 166 lb (75.3 kg)  Height: '5\' 8"'  (1.727 m)   Body mass index is 25.24 kg/m. Wt Readings from Last 3 Encounters:  01/23/17 166 lb (75.3 kg)  01/14/17 166 lb (75.3 kg)  01/03/17 149 lb 12.8 oz (67.9 kg)      Physical Exam  Constitutional: He is oriented to person, place, and time. He appears well-developed and well-nourished.  Thin, frail   HENT:  Head: Normocephalic and atraumatic.  Right Ear: External ear normal.  Left Ear: External ear normal.  Nose: Nose normal.  Eyes: Conjunctivae and EOM are normal.  Mild erythema of the right eye.  Neck: No JVD present. No tracheal  deviation present. No thyromegaly present.  Cardiovascular: Normal rate, regular rhythm, normal heart sounds and intact distal pulses.  Exam reveals no gallop and no friction rub.   No murmur heard. Pulmonary/Chest: No respiratory distress. He has no wheezes. He has no rales. He exhibits no tenderness.  Slight cough during today's exam.  Abdominal: He exhibits no distension and no mass. There is no tenderness.  Genitourinary: Rectum normal. Rectal exam shows guaiac negative stool.  Musculoskeletal: He exhibits no edema or tenderness.  Prior left hip fx. Healed incision. Unstable gait. Using walker.Muscular wasting of hand muscles and forearm muscles and lower leg muscles. Contractures of all fingers in both hands. Tender in the interscapular area and in the upper pectoral muscles of the chest.  Lymphadenopathy:    He has no cervical adenopathy.  Neurological: He is alert and oriented to person, place, and time. No cranial nerve deficit. Coordination abnormal.  Bilateral foot drop. Tremor. Generalized weakness based on neuromuscular disease.  Skin: Skin is warm and dry. No rash noted. No erythema. No pallor.  1 x 3  inch right heel decubitus with Dalma Panchal/gray base with  Pink tissue surrounding.   Psychiatric: He has a normal mood and affect. His behavior is normal. Judgment and thought content normal.    Labs reviewed: Lab Summary Latest Ref Rng & Units 12/03/2016 06/01/2016 02/06/2016 09/27/2015  Hemoglobin 12.6 - 17.7 g/dL (None) (None) (None) 13.5  Hematocrit 37.5 - 51.0 % (None) (None) (None) 40.4  White count 3.4 - 10.8 x10E3/uL (None) (None) (None) 7.2  Platelet count 150 - 379 x10E3/uL (None) (None) (None) 173  Sodium 135 - 146 mmol/L 138 142 142 139  Potassium 3.5 - 5.3 mmol/L 5.0 4.6 4.6 4.9  Calcium 8.6 - 10.3 mg/dL 9.5 8.9 9.3 9.0  Phosphorus - (None) (None) (None) (None)  Creatinine 0.70 - 1.11 mg/dL 1.56(H) 1.63(H) 1.67(H) 1.33(H)  AST 10 - 35 U/L 19 (None) (None) 20  Alk Phos 40  - 115 U/L 70 (None) (None) 86  Bilirubin 0.2 - 1.2 mg/dL 0.4 (None) (None) 0.3  Glucose 65 - 99 mg/dL 81 80 85 80  Cholesterol <200 mg/dL 279(H) (None) (None) (None)  HDL cholesterol >40 mg/dL 62 (None) (None) 55  Triglycerides <150 mg/dL 109 (None) (None) 120  LDL Direct - (None) (None) (None) (None)  LDL Calc <100 mg/dL 195(H) (None) (None) 207(H)  Total protein 6.1 - 8.1 g/dL 6.7 (None) (None) (None)  Albumin 3.6 - 5.1 g/dL 3.4(L) (None) (None) 3.5  Some recent data might be hidden   No results found for: TSH, T3TOTAL, T4TOTAL, THYROIDAB Lab Results  Component Value Date  BUN 33 (H) 12/03/2016   BUN 36 (H) 06/01/2016   BUN 40 (H) 02/06/2016   No results found for: HGBA1C   112/18/17 Barium Swallow:  IMPRESSION: Distal esophageal dysmotility with only intermittent relaxation of the lower esophageal sphincter which caused pooling of contrast, tertiary activity and lodging of the barium tablet  Fluoroscopy time: 3.38 minutes  Result Narrative  TECHNIQUE: Barium esophagram with tablet. Contrast and tablet were administered orally under intermittent fluoroscopic observation. No spot films were obtained. Multiple fluoroscopic images were captured which do not increase patient radiation exposure.   FINDINGS: Examination is limited to the AP projection due to the patient's mobility issues as a consequence of recent hip surgery. Oro-pharyngeal structures are grossly normal. There is no evidence of hiatal hernia, esophageal stricture, diverticulum or  esophagitis. Mucosal surfaces are smooth. No reflux was observed during the course of the examination. Motility however is abnormal. The primary wave failed to propagate through the GE junction on multiple occasions which produce significant pooling of  contrast in the distal third of the organ and resulted in tertiary activity. The lower esophageal sphincter does intermittently open allowing contrast into the stomach. When the patient swallowed  the tablet, it became lodged at the GE junction and never  did pass into the stomach in spite of multiple additional swallows of both water and barium.     Assessment/Plan  1. Cough Tussionex or Delsym for symptom relief  2. Essential hypertension observe  3. Dysphagia, unspecified type Discussed appropriate foods. This is likely to be a chronic issue.   4. Aspiration pneumonia, unspecified aspiration pneumonia type, unspecified laterality, unspecified part of lung ([place]) - PA and lateral CXR - amoxicillin-clavulanate (AUGMENTIN) 875-125 MG tablet; One twice daily for infection  Dispense: 14 tablet; Refill: 0

## 2017-01-23 NOTE — Patient Instructions (Addendum)
Delsym cough medication is available OTC.  Get chest xray tomorrow.

## 2017-01-24 ENCOUNTER — Telehealth: Payer: Self-pay

## 2017-01-24 DIAGNOSIS — J189 Pneumonia, unspecified organism: Secondary | ICD-10-CM | POA: Diagnosis not present

## 2017-01-24 DIAGNOSIS — I700 Atherosclerosis of aorta: Secondary | ICD-10-CM | POA: Diagnosis not present

## 2017-01-24 DIAGNOSIS — R918 Other nonspecific abnormal finding of lung field: Secondary | ICD-10-CM | POA: Diagnosis not present

## 2017-01-24 NOTE — Telephone Encounter (Signed)
Message left on clinical intake voicemail, patient called requesting chest xray results from this morning  Report received via fax: [facility], reviewed by Dr.Reed (Dr.Green out of office)  Impression: Interstitial infiltrate right lower lobe concerning for pneumonia.  Patient informed of results, patient is on correct treatment, per Dr.Reed complete entire course

## 2017-01-25 DIAGNOSIS — M21372 Foot drop, left foot: Secondary | ICD-10-CM | POA: Diagnosis not present

## 2017-01-25 DIAGNOSIS — M6281 Muscle weakness (generalized): Secondary | ICD-10-CM | POA: Diagnosis not present

## 2017-01-25 DIAGNOSIS — M21371 Foot drop, right foot: Secondary | ICD-10-CM | POA: Diagnosis not present

## 2017-01-25 DIAGNOSIS — M1000 Idiopathic gout, unspecified site: Secondary | ICD-10-CM | POA: Diagnosis not present

## 2017-01-29 ENCOUNTER — Ambulatory Visit (INDEPENDENT_AMBULATORY_CARE_PROVIDER_SITE_OTHER): Payer: Medicare Other | Admitting: Nurse Practitioner

## 2017-01-29 ENCOUNTER — Encounter: Payer: Self-pay | Admitting: Nurse Practitioner

## 2017-01-29 VITALS — BP 144/76 | HR 85 | Temp 97.7°F | Ht 68.0 in | Wt 167.4 lb

## 2017-01-29 DIAGNOSIS — R059 Cough, unspecified: Secondary | ICD-10-CM

## 2017-01-29 DIAGNOSIS — J690 Pneumonitis due to inhalation of food and vomit: Secondary | ICD-10-CM | POA: Diagnosis not present

## 2017-01-29 DIAGNOSIS — L89613 Pressure ulcer of right heel, stage 3: Secondary | ICD-10-CM

## 2017-01-29 DIAGNOSIS — R1310 Dysphagia, unspecified: Secondary | ICD-10-CM | POA: Diagnosis not present

## 2017-01-29 DIAGNOSIS — R05 Cough: Secondary | ICD-10-CM

## 2017-01-29 NOTE — Progress Notes (Signed)
Careteam: Patient Care Team: [name], MD as PCP - General (Internal Medicine)  Advanced Directive information Does Patient Have a Medical Advance Directive?: No;Yes, Type of Advance Directive: [place];Living will  Allergies  Allergen Reactions  . Aleve [Naproxen Sodium]     Upset stomach   . Nsaids     Bother his stomach  . Antihistamines, Diphenhydramine-Type     Causes urinary issues    Chief Complaint  Patient presents with  . Medical Management of Chronic Issues    Follow up on pressue sore on right foot.      HPI: Patient is a 83 y.o. male seen in the office today for follow up on pressure ulcer of right heel.  Pt was also seen last week by Dr Nyoka Cowden due to cough with low grade fever. Pt with hx of dysphagia and recurrent aspiration and was placed on Augmentin. Chest xray was concerning for pneumonia and he was instruction to take full course. Pt has 2 days of antibiotic left. Tolerating medication well. No GI upset. Cough has significantly improved. No fevers or chills; good appetite .   Home health coming out to their house.  He has nursing to change dressing on foot every 2-3 days.  PT/OT coming to the house as well. Recommended ST and planning to have therapist work with him.   Review of Systems:  Review of Systems  Constitutional: Positive for fatigue. Negative for fever.  HENT:       Light headed and dizzy feelings  Eyes: Positive for visual disturbance (corrective lenses).  Respiratory: Positive for cough. Negative for shortness of breath and wheezing.   Cardiovascular: Negative for chest pain, palpitations and leg swelling.  Gastrointestinal: Negative for abdominal pain, blood in stool and diarrhea.       Episodes of aspiration. Swallowing has been affected by his neuro muscular disease. Occasional problems with loose stools.  Endocrine: Negative.   Genitourinary: Positive for difficulty urinating.       Issues with nocturia  and urgency. Rising PSA.  Musculoskeletal:         S/P left hip fx.2014 and right hip fx Nov 2017. Chronic bilateral foot drop. Uses AFO regularly inside his shoe.   Skin:       Heeling right heel ulcer  Neurological:       Polyneuropathy. Bilateral foot drop.  Hematological:       Hx Anemia.  Psychiatric/Behavioral: Negative.     Past Medical History:  Diagnosis Date  . Abnormality of gait   . Benign neoplasm of colon   . Chronic kidney disease, stage III (moderate)   . Complication of anesthesia    postop headache after general anesthesia  . Degenerative arthritis   . Diverticulosis of colon (without mention of hemorrhage)   . Duodenal ulcer, unspecified as acute or chronic, without hemorrhage, perforation, or obstruction   . Encounter for long-term (current) use of other medications   . Enlarged prostate    takes Tamsulosin daily  . Essential and other specified forms of tremor   . Foley catheter in place 09-01-13   4 months now  . Foot drop, bilateral   . Gait disorder    balance issues-uses walker or mobile chair.  . Gout    takes Allopurinol daily  . Gout, unspecified   . Hyperlipidemia    but doesn't require meds  . Hypertension    takes Enalapril daily   . Inguinal hernia without mention of  obstruction or gangrene, unilateral or unspecified, (not specified as recurrent)   . Internal hemorrhoids without mention of complication   . Macular degeneration    takes eye cap vits daily  . Other abnormal blood chemistry   . Peripheral neuropathy   . Peripheral neuropathy   . Pneumonia    hx of;last time 8'14-mild.  . Ptosis    Left side  . Spinal stenosis, lumbar region, with neurogenic claudication   . TIA (transient ischemic attack)    sees Dr.Willis-neurologist 6'14(strange feeling of head)-no residual  . Unspecified vitamin D deficiency   . Urinary frequency   . Urinary urgency   . UTI (lower urinary tract infection) 09-01-13   tx. 2 weeks ago   Past  Surgical History:  Procedure Laterality Date  . CATARACT EXTRACTION Right 08/31/2014   Dr.Lyles  . colonosocpy    . FINGER SURGERY Left    Left ring finger  . HERNIA REPAIR     at least 20yrs ago  . HIP PINNING,CANNULATED Left 05/05/2013   Procedure: CANNULATED HIP PINNING- left;  Surgeon: Renette Butters, MD;  Location: [facility];  Service: Orthopedics;  Laterality: Left;  . INGUINAL HERNIA REPAIR  09/05/2012   Procedure: LAPAROSCOPIC INGUINAL HERNIA;  Surgeon: Gayland Curry, MD,FACS;  Location: [facility];  Service: General;  Laterality: Left;  . INSERTION OF MESH  09/05/2012   Procedure: INSERTION OF MESH;  Surgeon: Gayland Curry, MD,FACS;  Location: [facility];  Service: General;  Laterality: Left;  . left leg surgery  2011  . NERVE BIOPSY    . TRANSURETHRAL RESECTION OF PROSTATE N/A 09/07/2013   Procedure: CYSTO TRANSURETHRAL RESECTION OF THE PROSTATE (TURP);  Surgeon: Reece Packer, MD;  Location: [facility];  Service: Urology;  Laterality: N/A;   Social History:   reports that he quit smoking about 31 years ago. His smoking use included Cigarettes. He quit smokeless tobacco use about 15 years ago. He reports that he does not drink alcohol or use drugs.  Family History  Problem Relation Age of Onset  . Diabetes Mother     type 2  . Clotting disorder Mother     deceased, blood clot  . Leukemia Father   . COPD Brother   . Lung cancer Sister   . Heart disease Brother     1/2 brother    Medications: Patient's Medications  New Prescriptions   No medications on file  Previous Medications   ACETAMINOPHEN (TYLENOL) 500 MG TABLET    Take 500-1,000 mg by mouth every 6 (six) hours as needed.   ALLOPURINOL (ZYLOPRIM) 100 MG TABLET    TAKE ONE TABLET BY MOUTH TWICE DAILY TO  PREVENT  GOUT   AMOXICILLIN-CLAVULANATE (AUGMENTIN) 875-125 MG TABLET    One twice daily for infection   B COMPLEX VITAMINS CAPSULE    Take 1 capsule by mouth daily.    CHLORPHENIRAMINE-HYDROCODONE (TUSSIONEX PENNKINETIC ER)  10-8 MG/5ML SUER    5 cc every 12 hours if needed for cough   CHOLECALCIFEROL (VITAMIN D3) 2000 UNITS TABS    Take by mouth daily.   CLOPIDOGREL (PLAVIX) 75 MG TABLET    TAKE ONE TABLET BY MOUTH ONCE DAILY FOR ANTICOAGULATION   FAMOTIDINE (PEPCID) 20 MG TABLET    Take 20 mg by mouth 2 (two) times daily.   FINASTERIDE (PROSCAR) 5 MG TABLET    TAKE ONE TABLET BY MOUTH ONCE DAILY TO HELP SHRINK PROSTATE.   MULTIPLE VITAMINS-MINERALS (OCUVITE ADULT 50+ PO)  Take by mouth.   PRIMIDONE (MYSOLINE) 50 MG TABLET    TAKE ONE TABLET BY MOUTH ONCE DAILY IN THE EVENING TO HELP WITH TREMORS.   SILVER SULFADIAZINE (SILVADENE) 1 % CREAM    Apply 1 application topically daily.   TAMSULOSIN (FLOMAX) 0.4 MG CAPS CAPSULE    Take 1 capsule (0.4 mg total) by mouth daily.  Modified Medications   No medications on file  Discontinued Medications   No medications on file     Physical Exam:  Vitals:   01/29/17 0957  BP: (!) 144/76  Pulse: 85  Temp: 97.7 F (36.5 C)  TempSrc: Oral  SpO2: 96%  Weight: 167 lb 6.4 oz (75.9 kg)  Height: 5\' 8"  (1.727 m)   Body mass index is 25.45 kg/m.  Physical Exam  Constitutional: He is oriented to person, place, and time. He appears well-developed and well-nourished.  Thin, frail   HENT:  Head: Normocephalic and atraumatic.  Right Ear: External ear normal.  Left Ear: External ear normal.  Nose: Nose normal.  Eyes: Conjunctivae and EOM are normal.  Cardiovascular: Normal rate, regular rhythm and normal heart sounds.   Pulmonary/Chest: Effort normal and breath sounds normal. No respiratory distress. He has no wheezes. He has no rales. He exhibits no tenderness.  Abdominal: Soft. Bowel sounds are normal.  Genitourinary: Rectum normal. Rectal exam shows guaiac negative stool.  Musculoskeletal: He exhibits no edema or tenderness.  Prior left hip fx. Healed incision. Unstable gait. Using walker.Muscular wasting of hand muscles and forearm muscles and lower leg  muscles. Contractures of all fingers in both hands. Tender in the interscapular area and in the upper pectoral muscles of the chest.  Neurological: He is alert and oriented to person, place, and time. No cranial nerve deficit. Coordination abnormal.  Bilateral foot drop. Tremor. Generalized weakness based on neuromuscular disease.  Skin: Skin is warm and dry.  1 x 3  inch oval right heel decubitus with pink base. No erythema or odor.   Psychiatric: He has a normal mood and affect. His behavior is normal. Judgment and thought content normal.   Labs reviewed: Basic Metabolic Panel:  Recent Labs  02/06/16 1012 06/01/16 1039 12/03/16 0908  NA 142 142 138  K 4.6 4.6 5.0  CL 101 107 102  CO2 24 25 29   GLUCOSE 85 80 81  BUN 40* 36* 33*  CREATININE 1.67* 1.63* 1.56*  CALCIUM 9.3 8.9 9.5   Liver Function Tests:  Recent Labs  12/03/16 0908  AST 19  ALT 10  ALKPHOS 70  BILITOT 0.4  PROT 6.7  ALBUMIN 3.4*   No results for input(s): LIPASE, AMYLASE in the last 8760 hours. No results for input(s): AMMONIA in the last 8760 hours. CBC: No results for input(s): WBC, NEUTROABS, HGB, HCT, MCV, PLT in the last 8760 hours. Lipid Panel:  Recent Labs  12/03/16 0908  CHOL 279*  HDL 62  LDLCALC 195*  TRIG 109  CHOLHDL 4.5   TSH: No results for input(s): TSH in the last 8760 hours. A1C: No results found for: HGBA1C   Assessment/Plan 1. Aspiration pneumonia, unspecified aspiration pneumonia type, unspecified laterality, unspecified part of lung ([place]) Improving, pt conts on Augmentin with good effect. O2 sats good. No shortness of breath and cough has improved. ST will be seeing him at home   2. Pressure ulcer of right heel, stage 3 (HCC) Improving. conts to offload heel, proper nutrition to promote wound heeling with silver sufadiazine and dressing. conts to  see podiatry who is managing wound and has [place] nursing  3. Dysphagia, unspecified type Ongoing issue due to neuromuscular  disease HH ST to evaluate and treat, may need dietary changes.   4. Cough Has significantly improved after aspiration pneumonia.   Drew Gibson. Harle Battiest  [facility] [phone] 8 am - 5 pm) [phone] (after hours)

## 2017-01-29 NOTE — Patient Instructions (Signed)
Complete full course of antibiotic To have ST evaluate and treat Keep upcoming appt Notify us as needed Cont silvadene and dressing changes.

## 2017-02-01 ENCOUNTER — Telehealth: Payer: Self-pay | Admitting: *Deleted

## 2017-02-01 NOTE — Telephone Encounter (Signed)
Lysbeth Galas with [facility] called and requested verbal orders for Speech therapy for patient to be evaluated. Verbal orders given.

## 2017-02-24 DIAGNOSIS — M6281 Muscle weakness (generalized): Secondary | ICD-10-CM | POA: Diagnosis not present

## 2017-02-24 DIAGNOSIS — M21372 Foot drop, left foot: Secondary | ICD-10-CM | POA: Diagnosis not present

## 2017-02-24 DIAGNOSIS — M21371 Foot drop, right foot: Secondary | ICD-10-CM | POA: Diagnosis not present

## 2017-02-24 DIAGNOSIS — M1000 Idiopathic gout, unspecified site: Secondary | ICD-10-CM | POA: Diagnosis not present

## 2017-02-27 DIAGNOSIS — R1314 Dysphagia, pharyngoesophageal phase: Secondary | ICD-10-CM | POA: Diagnosis not present

## 2017-02-27 DIAGNOSIS — L89610 Pressure ulcer of right heel, unstageable: Secondary | ICD-10-CM | POA: Diagnosis not present

## 2017-02-27 DIAGNOSIS — N183 Chronic kidney disease, stage 3 (moderate): Secondary | ICD-10-CM | POA: Diagnosis not present

## 2017-02-27 DIAGNOSIS — I129 Hypertensive chronic kidney disease with stage 1 through stage 4 chronic kidney disease, or unspecified chronic kidney disease: Secondary | ICD-10-CM | POA: Diagnosis not present

## 2017-02-28 ENCOUNTER — Telehealth: Payer: Self-pay

## 2017-02-28 NOTE — Telephone Encounter (Signed)
Minda with [facility] called for speech therapy orders.  Per [employer] standing order, verbal order given. Message will be sent to patient's provider as a FYI.

## 2017-03-12 ENCOUNTER — Ambulatory Visit (INDEPENDENT_AMBULATORY_CARE_PROVIDER_SITE_OTHER): Payer: Medicare Other | Admitting: Nurse Practitioner

## 2017-03-12 ENCOUNTER — Encounter: Payer: Self-pay | Admitting: Nurse Practitioner

## 2017-03-12 VITALS — BP 134/78 | HR 99 | Temp 97.4°F | Ht 68.0 in | Wt 167.0 lb

## 2017-03-12 DIAGNOSIS — N400 Enlarged prostate without lower urinary tract symptoms: Secondary | ICD-10-CM

## 2017-03-12 DIAGNOSIS — R1310 Dysphagia, unspecified: Secondary | ICD-10-CM

## 2017-03-12 DIAGNOSIS — G250 Essential tremor: Secondary | ICD-10-CM

## 2017-03-12 DIAGNOSIS — I10 Essential (primary) hypertension: Secondary | ICD-10-CM

## 2017-03-12 DIAGNOSIS — L89613 Pressure ulcer of right heel, stage 3: Secondary | ICD-10-CM

## 2017-03-12 DIAGNOSIS — N62 Hypertrophy of breast: Secondary | ICD-10-CM

## 2017-03-12 NOTE — Patient Instructions (Addendum)
Cont same wound care and wound precautions  Can stop proscar if symptoms get worse   Follow up in 4 weeks for wound check  3 months for routine follow up.

## 2017-03-12 NOTE — Progress Notes (Signed)
Careteam: Patient Care Team: [name], MD as PCP - General (Internal Medicine)   Allergies  Allergen Reactions  . Aleve [Naproxen Sodium]     Upset stomach   . Nsaids     Bother his stomach  . Antihistamines, Diphenhydramine-Type     Causes urinary issues    Chief Complaint  Patient presents with  . Medical Management of Chronic Issues    3 month follow-up, + fall risk   . Immunizations    Refused Shingles vaccine   . Medication Refill     HPI: Patient is a 83 y.o. male seen in the office today for routine follow up.  Pt with hx of stage 3 right heel ulcer, tremor, abnormal gait, HTN, dysphagia with recurrent aspiration pneumonia. Pt was seen in may for follow up aspiration pneumonia and ST ordered placed.   Pressure ulcer of right heel, stage 3 ([place])- healing slowly- conts on silvadene dressing every 3 days. No drainage or odor.   Dysphagia, unspecified type- met with ST once who gave him tips and tricks which have been beneficial when swallowing.   Essential hypertension- not currently on medication.  Benign essential tremor-  Well controlled on primidone   Benign prostatic hyperplasia, unspecified whether lower urinary tract symptoms present- stable on flomax and proscar, which have been beneficial.   Gout- stable on allopurinol.   Notes breast have been sore over the past month- just mentioned to wife. Also noticed his breast seemed larger.   Review of Systems:  Review of Systems  Constitutional: Negative for chills and fever.  HENT: Negative for congestion, sinus pain and sore throat.        Sneezing  Eyes: Negative for blurred vision.       Glasses  Respiratory: Positive for cough. Negative for sputum production and shortness of breath.   Cardiovascular: Negative for chest pain, palpitations and leg swelling.  Gastrointestinal: Negative for abdominal pain, constipation, diarrhea, heartburn, nausea and vomiting.  Genitourinary: Negative for  dysuria.  Musculoskeletal: Negative for falls, joint pain and myalgias.  Skin: Negative for itching and rash.       Pressure ulcer heel- improving   Neurological: Positive for tremors and weakness. Negative for dizziness and headaches.  Psychiatric/Behavioral: Negative for depression. The patient is not nervous/anxious and does not have insomnia.     Past Medical History:  Diagnosis Date  . Abnormality of gait   . Benign neoplasm of colon   . Chronic kidney disease, stage III (moderate)   . Complication of anesthesia    postop headache after general anesthesia  . Degenerative arthritis   . Diverticulosis of colon (without mention of hemorrhage)   . Duodenal ulcer, unspecified as acute or chronic, without hemorrhage, perforation, or obstruction   . Encounter for long-term (current) use of other medications   . Enlarged prostate    takes Tamsulosin daily  . Essential and other specified forms of tremor   . Foley catheter in place 09-01-13   4 months now  . Foot drop, bilateral   . Gait disorder    balance issues-uses walker or mobile chair.  . Gout    takes Allopurinol daily  . Gout, unspecified   . Hyperlipidemia    but doesn't require meds  . Hypertension    takes Enalapril daily   . Inguinal hernia without mention of obstruction or gangrene, unilateral or unspecified, (not specified as recurrent)   . Internal hemorrhoids without mention of complication   .  Macular degeneration    takes eye cap vits daily  . Other abnormal blood chemistry   . Peripheral neuropathy   . Peripheral neuropathy   . Pneumonia    hx of;last time 8'14-mild.  . Ptosis    Left side  . Spinal stenosis, lumbar region, with neurogenic claudication   . TIA (transient ischemic attack)    sees Dr.Willis-neurologist 6'14(strange feeling of head)-no residual  . Unspecified vitamin D deficiency   . Urinary frequency   . Urinary urgency   . UTI (lower urinary tract infection) 09-01-13   tx. 2 weeks ago     Past Surgical History:  Procedure Laterality Date  . CATARACT EXTRACTION Right 08/31/2014   Dr.Lyles  . colonosocpy    . FINGER SURGERY Left    Left ring finger  . HERNIA REPAIR     at least 69yr ago  . HIP PINNING,CANNULATED Left 05/05/2013   Procedure: CANNULATED HIP PINNING- left;  Surgeon: TRenette Butters MD;  Location: [facility]  Service: Orthopedics;  Laterality: Left;  . INGUINAL HERNIA REPAIR  09/05/2012   Procedure: LAPAROSCOPIC INGUINAL HERNIA;  Surgeon: EGayland Curry MD,FACS;  [place] [facility]  Service: General;  Laterality: Left;  . INSERTION OF MESH  09/05/2012   Procedure: INSERTION OF MESH;  Surgeon: EGayland Curry MD,FACS;  [place] [facility]  Service: General;  Laterality: Left;  . left leg surgery  2011  . NERVE BIOPSY    . TRANSURETHRAL RESECTION OF PROSTATE N/A 09/07/2013   Procedure: CYSTO TRANSURETHRAL RESECTION OF THE PROSTATE (TURP);  Surgeon: SReece Packer MD;  Location[facility];  Service: Urology;  Laterality: N/A;   Social History:   reports that he quit smoking about 31 years ago. His smoking use included Cigarettes. He quit smokeless tobacco use about 16 years ago. He reports that he does not drink alcohol or use drugs.  Family History  Problem Relation Age of Onset  . Diabetes Mother        type 2  . Clotting disorder Mother        deceased, blood clot  . Leukemia Father   . COPD Brother   . Lung cancer Sister   . Heart disease Brother        1/2 brother    Medications: Patient's Medications  New Prescriptions   No medications on file  Previous Medications   ACETAMINOPHEN (TYLENOL) 500 MG TABLET    Take 500-1,000 mg by mouth every 6 (six) hours as needed.   ALLOPURINOL (ZYLOPRIM) 100 MG TABLET    TAKE ONE TABLET BY MOUTH TWICE DAILY TO  PREVENT  GOUT   B COMPLEX VITAMINS CAPSULE    Take 1 capsule by mouth daily.    CHOLECALCIFEROL (VITAMIN D3) 2000 UNITS TABS    Take by mouth daily.   CLOPIDOGREL (PLAVIX) 75 MG TABLET    TAKE ONE TABLET BY  MOUTH ONCE DAILY FOR ANTICOAGULATION   FINASTERIDE (PROSCAR) 5 MG TABLET    TAKE ONE TABLET BY MOUTH ONCE DAILY TO HELP SHRINK PROSTATE.   MULTIPLE VITAMINS-MINERALS (OCUVITE ADULT 50+ PO)    Take by mouth.   PRIMIDONE (MYSOLINE) 50 MG TABLET    TAKE ONE TABLET BY MOUTH ONCE DAILY IN THE EVENING TO HELP WITH TREMORS.   SILVER SULFADIAZINE (SILVADENE) 1 % CREAM    Apply 1 application topically daily.   TAMSULOSIN (FLOMAX) 0.4 MG CAPS CAPSULE    Take 1 capsule (0.4 mg total) by mouth daily.  Modified  Medications   No medications on file  Discontinued Medications   AMOXICILLIN-CLAVULANATE (AUGMENTIN) 875-125 MG TABLET    One twice daily for infection   CHLORPHENIRAMINE-HYDROCODONE (TUSSIONEX PENNKINETIC ER) 10-8 MG/5ML SUER    5 cc every 12 hours if needed for cough   FAMOTIDINE (PEPCID) 20 MG TABLET    Take 20 mg by mouth 2 (two) times daily.     Physical Exam:  Vitals:   03/12/17 1314  BP: 134/78  Pulse: 99  Temp: 97.4 F (36.3 C)  TempSrc: Oral  SpO2: 99%  Weight: 167 lb (75.8 kg)  Height: _0  (1.727 m)   Body mass index is 25.39 kg/m.  Physical Exam  Constitutional: He is oriented to person, place, and time. He appears well-developed and well-nourished.  Thin, frail   HENT:  Head: Normocephalic and atraumatic.  Right Ear: External ear normal.  Left Ear: External ear normal.  Nose: Nose normal.  Eyes: Conjunctivae and EOM are normal.  Cardiovascular: Normal rate, regular rhythm and normal heart sounds.   Pulmonary/Chest: Effort normal and breath sounds normal.  Abdominal: Soft. Bowel sounds are normal.  Genitourinary: Rectum normal. Rectal exam shows guaiac negative stool.  Musculoskeletal: He exhibits no edema or tenderness.  Prior left hip fx. Unstable gait. Using walker.Muscular wasting of hand muscles and forearm muscles and lower leg muscles. Contractures of all fingers in both hands.   Neurological: He is alert and oriented to person, place, and time. No  cranial nerve deficit. Coordination abnormal.  Bilateral foot drop. Tremor. Generalized weakness based on neuromuscular disease.  Skin: Skin is warm and dry.  0.5 x 1.5 x 0.2 cm oval right heel decubitus with pink and yellow base. No erythema or odor.   Psychiatric: He has a normal mood and affect. His behavior is normal. Judgment and thought content normal.    Labs reviewed: Basic Metabolic Panel:  Recent Labs  06/01/16 1039 12/03/16 0908  NA 142 138  K 4.6 5.0  CL 107 102  CO2 25 29  GLUCOSE 80 81  BUN 36* 33*  CREATININE 1.63* 1.56*  CALCIUM 8.9 9.5   Liver Function Tests:  Recent Labs  12/03/16 0908  AST 19  ALT 10  ALKPHOS 70  BILITOT 0.4  PROT 6.7  ALBUMIN 3.4*   No results for input(s): LIPASE, AMYLASE in the last 8760 hours. No results for input(s): AMMONIA in the last 8760 hours. CBC: No results for input(s): WBC, NEUTROABS, HGB, HCT, MCV, PLT in the last 8760 hours. Lipid Panel:  Recent Labs  12/03/16 0908  CHOL 279*  HDL 62  LDLCALC 195*  TRIG 109  CHOLHDL 4.5   TSH: No results for input(s): TSH in the last 8760 hours. A1C: No results found for: HGBA1C   Assessment/Plan 1. Pressure ulcer of right heel, stage 3 (HCC) Slowly improving, to cont current wound care, follow up in 1 month, sooner if it worsens. To cont pressure reduction and proper protein.   2. Dysphagia, unspecified type -stable, cont recs from ST  3. Essential hypertension -stable, not requiring medicaiton  4. Benign essential tremor Improved on primidone  5. Benign prostatic hyperplasia, unspecified whether lower urinary tract symptoms present Stable on proscar and flomax. Doing well on current therapy and not having to get up at night to use the restroom.   6. Gynecomastia Possible related to proscar, also has been on antifungal for toenails in the recent past. To stop proscar if symptom worsen.   Carlos American. Harle Battiest  [place] [phone] 8 am - 5 pm) [phone] (after hours)

## 2017-03-19 ENCOUNTER — Encounter (HOSPITAL_BASED_OUTPATIENT_CLINIC_OR_DEPARTMENT_OTHER): Payer: Medicare Other | Attending: Surgery

## 2017-03-19 ENCOUNTER — Ambulatory Visit (HOSPITAL_COMMUNITY)
Admission: RE | Admit: 2017-03-19 | Discharge: 2017-03-19 | Disposition: A | Discharge status: Home / Self Care | Payer: Medicare Other | Source: Ambulatory Visit | Attending: Surgery | Admitting: Surgery

## 2017-03-19 ENCOUNTER — Other Ambulatory Visit: Payer: Self-pay | Admitting: Surgery

## 2017-03-19 DIAGNOSIS — I739 Peripheral vascular disease, unspecified: Secondary | ICD-10-CM | POA: Diagnosis not present

## 2017-03-19 DIAGNOSIS — G6182 Multifocal motor neuropathy: Secondary | ICD-10-CM | POA: Diagnosis not present

## 2017-03-19 DIAGNOSIS — M109 Gout, unspecified: Secondary | ICD-10-CM | POA: Insufficient documentation

## 2017-03-19 DIAGNOSIS — M869 Osteomyelitis, unspecified: Secondary | ICD-10-CM

## 2017-03-19 DIAGNOSIS — I129 Hypertensive chronic kidney disease with stage 1 through stage 4 chronic kidney disease, or unspecified chronic kidney disease: Secondary | ICD-10-CM | POA: Diagnosis not present

## 2017-03-19 DIAGNOSIS — L89613 Pressure ulcer of right heel, stage 3: Secondary | ICD-10-CM | POA: Diagnosis not present

## 2017-03-19 DIAGNOSIS — Z8673 Personal history of transient ischemic attack (TIA), and cerebral infarction without residual deficits: Secondary | ICD-10-CM | POA: Diagnosis not present

## 2017-03-19 DIAGNOSIS — N183 Chronic kidney disease, stage 3 (moderate): Secondary | ICD-10-CM | POA: Insufficient documentation

## 2017-03-19 DIAGNOSIS — R2689 Other abnormalities of gait and mobility: Secondary | ICD-10-CM | POA: Insufficient documentation

## 2017-03-19 DIAGNOSIS — N400 Enlarged prostate without lower urinary tract symptoms: Secondary | ICD-10-CM | POA: Diagnosis not present

## 2017-03-19 DIAGNOSIS — Z79899 Other long term (current) drug therapy: Secondary | ICD-10-CM | POA: Diagnosis not present

## 2017-03-19 DIAGNOSIS — S91301A Unspecified open wound, right foot, initial encounter: Secondary | ICD-10-CM | POA: Diagnosis not present

## 2017-03-19 DIAGNOSIS — Z87891 Personal history of nicotine dependence: Secondary | ICD-10-CM | POA: Insufficient documentation

## 2017-03-19 DIAGNOSIS — Z7902 Long term (current) use of antithrombotics/antiplatelets: Secondary | ICD-10-CM | POA: Insufficient documentation

## 2017-03-26 ENCOUNTER — Other Ambulatory Visit: Payer: Self-pay | Admitting: Surgery

## 2017-03-26 ENCOUNTER — Other Ambulatory Visit: Payer: Self-pay | Admitting: Internal Medicine

## 2017-03-26 DIAGNOSIS — L97919 Non-pressure chronic ulcer of unspecified part of right lower leg with unspecified severity: Secondary | ICD-10-CM

## 2017-03-27 ENCOUNTER — Encounter (HOSPITAL_BASED_OUTPATIENT_CLINIC_OR_DEPARTMENT_OTHER): Payer: Medicare Other

## 2017-03-27 ENCOUNTER — Ambulatory Visit (HOSPITAL_COMMUNITY)
Admission: RE | Admit: 2017-03-27 | Discharge: 2017-03-27 | Disposition: A | Discharge status: Home / Self Care | Payer: Medicare Other | Source: Ambulatory Visit | Attending: Vascular Surgery | Admitting: Vascular Surgery

## 2017-03-27 DIAGNOSIS — I739 Peripheral vascular disease, unspecified: Secondary | ICD-10-CM | POA: Insufficient documentation

## 2017-03-27 DIAGNOSIS — Z9889 Other specified postprocedural states: Secondary | ICD-10-CM | POA: Insufficient documentation

## 2017-03-27 DIAGNOSIS — L97909 Non-pressure chronic ulcer of unspecified part of unspecified lower leg with unspecified severity: Secondary | ICD-10-CM | POA: Diagnosis present

## 2017-03-27 DIAGNOSIS — M21371 Foot drop, right foot: Secondary | ICD-10-CM | POA: Diagnosis not present

## 2017-03-27 DIAGNOSIS — L97919 Non-pressure chronic ulcer of unspecified part of right lower leg with unspecified severity: Secondary | ICD-10-CM | POA: Diagnosis not present

## 2017-03-27 DIAGNOSIS — M21372 Foot drop, left foot: Secondary | ICD-10-CM | POA: Diagnosis not present

## 2017-03-27 DIAGNOSIS — M6281 Muscle weakness (generalized): Secondary | ICD-10-CM | POA: Diagnosis not present

## 2017-03-27 DIAGNOSIS — M1000 Idiopathic gout, unspecified site: Secondary | ICD-10-CM | POA: Diagnosis not present

## 2017-04-01 ENCOUNTER — Encounter (HOSPITAL_BASED_OUTPATIENT_CLINIC_OR_DEPARTMENT_OTHER): Payer: Medicare Other | Attending: Internal Medicine

## 2017-04-01 DIAGNOSIS — Z87891 Personal history of nicotine dependence: Secondary | ICD-10-CM | POA: Insufficient documentation

## 2017-04-01 DIAGNOSIS — N183 Chronic kidney disease, stage 3 (moderate): Secondary | ICD-10-CM | POA: Diagnosis not present

## 2017-04-01 DIAGNOSIS — R2689 Other abnormalities of gait and mobility: Secondary | ICD-10-CM | POA: Insufficient documentation

## 2017-04-01 DIAGNOSIS — I129 Hypertensive chronic kidney disease with stage 1 through stage 4 chronic kidney disease, or unspecified chronic kidney disease: Secondary | ICD-10-CM | POA: Diagnosis not present

## 2017-04-01 DIAGNOSIS — G6182 Multifocal motor neuropathy: Secondary | ICD-10-CM | POA: Diagnosis not present

## 2017-04-01 DIAGNOSIS — I739 Peripheral vascular disease, unspecified: Secondary | ICD-10-CM | POA: Diagnosis not present

## 2017-04-01 DIAGNOSIS — L89613 Pressure ulcer of right heel, stage 3: Secondary | ICD-10-CM | POA: Insufficient documentation

## 2017-04-08 DIAGNOSIS — L89613 Pressure ulcer of right heel, stage 3: Secondary | ICD-10-CM | POA: Diagnosis not present

## 2017-04-09 ENCOUNTER — Ambulatory Visit: Payer: Medicare Other | Admitting: Nurse Practitioner

## 2017-04-10 DIAGNOSIS — M79671 Pain in right foot: Secondary | ICD-10-CM | POA: Diagnosis not present

## 2017-04-10 DIAGNOSIS — B351 Tinea unguium: Secondary | ICD-10-CM | POA: Diagnosis not present

## 2017-04-10 DIAGNOSIS — M79672 Pain in left foot: Secondary | ICD-10-CM | POA: Diagnosis not present

## 2017-04-10 DIAGNOSIS — L84 Corns and callosities: Secondary | ICD-10-CM | POA: Diagnosis not present

## 2017-04-15 DIAGNOSIS — L89613 Pressure ulcer of right heel, stage 3: Secondary | ICD-10-CM | POA: Diagnosis not present

## 2017-04-22 ENCOUNTER — Other Ambulatory Visit: Payer: Self-pay | Admitting: Dermatology

## 2017-04-22 DIAGNOSIS — D485 Neoplasm of uncertain behavior of skin: Secondary | ICD-10-CM | POA: Diagnosis not present

## 2017-04-22 DIAGNOSIS — L89613 Pressure ulcer of right heel, stage 3: Secondary | ICD-10-CM | POA: Diagnosis not present

## 2017-04-22 DIAGNOSIS — L570 Actinic keratosis: Secondary | ICD-10-CM | POA: Diagnosis not present

## 2017-04-22 DIAGNOSIS — C4339 Malignant melanoma of other parts of face: Secondary | ICD-10-CM | POA: Diagnosis not present

## 2017-04-26 DIAGNOSIS — M21372 Foot drop, left foot: Secondary | ICD-10-CM | POA: Diagnosis not present

## 2017-04-26 DIAGNOSIS — M1000 Idiopathic gout, unspecified site: Secondary | ICD-10-CM | POA: Diagnosis not present

## 2017-04-26 DIAGNOSIS — M6281 Muscle weakness (generalized): Secondary | ICD-10-CM | POA: Diagnosis not present

## 2017-04-26 DIAGNOSIS — M21371 Foot drop, right foot: Secondary | ICD-10-CM | POA: Diagnosis not present

## 2017-04-29 DIAGNOSIS — L89613 Pressure ulcer of right heel, stage 3: Secondary | ICD-10-CM | POA: Diagnosis not present

## 2017-04-29 DIAGNOSIS — L89619 Pressure ulcer of right heel, unspecified stage: Secondary | ICD-10-CM | POA: Diagnosis not present

## 2017-05-06 DIAGNOSIS — C4339 Malignant melanoma of other parts of face: Secondary | ICD-10-CM | POA: Diagnosis not present

## 2017-05-17 DIAGNOSIS — C4339 Malignant melanoma of other parts of face: Secondary | ICD-10-CM | POA: Diagnosis not present

## 2017-05-17 DIAGNOSIS — C4330 Malignant melanoma of unspecified part of face: Secondary | ICD-10-CM | POA: Diagnosis not present

## 2017-05-21 DIAGNOSIS — C4339 Malignant melanoma of other parts of face: Secondary | ICD-10-CM | POA: Diagnosis not present

## 2017-05-22 ENCOUNTER — Ambulatory Visit: Payer: Medicare Other | Admitting: Nurse Practitioner

## 2017-05-27 DIAGNOSIS — M21371 Foot drop, right foot: Secondary | ICD-10-CM | POA: Diagnosis not present

## 2017-05-27 DIAGNOSIS — M1000 Idiopathic gout, unspecified site: Secondary | ICD-10-CM | POA: Diagnosis not present

## 2017-05-27 DIAGNOSIS — M21372 Foot drop, left foot: Secondary | ICD-10-CM | POA: Diagnosis not present

## 2017-05-27 DIAGNOSIS — M6281 Muscle weakness (generalized): Secondary | ICD-10-CM | POA: Diagnosis not present

## 2017-06-12 ENCOUNTER — Ambulatory Visit (INDEPENDENT_AMBULATORY_CARE_PROVIDER_SITE_OTHER): Payer: Medicare Other | Admitting: Nurse Practitioner

## 2017-06-12 ENCOUNTER — Encounter: Payer: Self-pay | Admitting: Nurse Practitioner

## 2017-06-12 VITALS — BP 118/74 | HR 83 | Temp 97.8°F | Resp 17 | Ht 68.0 in | Wt 168.4 lb

## 2017-06-12 DIAGNOSIS — N400 Enlarged prostate without lower urinary tract symptoms: Secondary | ICD-10-CM

## 2017-06-12 DIAGNOSIS — L89612 Pressure ulcer of right heel, stage 2: Secondary | ICD-10-CM

## 2017-06-12 DIAGNOSIS — R269 Unspecified abnormalities of gait and mobility: Secondary | ICD-10-CM | POA: Diagnosis not present

## 2017-06-12 DIAGNOSIS — M1A9XX0 Chronic gout, unspecified, without tophus (tophi): Secondary | ICD-10-CM | POA: Diagnosis not present

## 2017-06-12 DIAGNOSIS — N183 Chronic kidney disease, stage 3 unspecified: Secondary | ICD-10-CM

## 2017-06-12 DIAGNOSIS — R972 Elevated prostate specific antigen [PSA]: Secondary | ICD-10-CM | POA: Diagnosis not present

## 2017-06-12 DIAGNOSIS — G63 Polyneuropathy in diseases classified elsewhere: Secondary | ICD-10-CM

## 2017-06-12 DIAGNOSIS — I679 Cerebrovascular disease, unspecified: Secondary | ICD-10-CM

## 2017-06-12 MED ORDER — ZOSTER VAC RECOMB ADJUVANTED 50 MCG/0.5ML IM SUSR: 0.5000 mL | Freq: Once | INTRAMUSCULAR | 1 refills | Status: AC

## 2017-06-12 NOTE — Progress Notes (Signed)
Careteam: Patient Care Team: [name], NP as PCP - General (Geriatric Medicine)  Advanced Directive information Does Patient Have a Medical Advance Directive?: No  Allergies  Allergen Reactions  . Aleve [Naproxen Sodium]     Upset stomach   . Nsaids     Bother his stomach  . Antihistamines, Diphenhydramine-Type     Causes urinary issues    Chief Complaint  Patient presents with  . Medical Management of Chronic Issues    Pt is being seen for a 3 month routine visit. Pt reports that he fell into [place] cabinet about 10 days ago. Pt was not checked out by provider. Pt reports he received a cut on hand and now he does not have feeling above cut.   . Other    Wife in room     HPI: Patient is a 83 y.o. male seen in the office today for routine follow up.  Pt with hx of stage 3 right heel ulcer, tremor, abnormal gait, HTN, dysphagia with recurrent aspiration pneumonia.  Reports fall 10 days ago, he lost balance (was using his walker) and fell into [place] cabinet. Broke the glass. Was not seen for this.  Now with worsening numbness to left hand. Had a small laceration which as healed.  Hx of neuropathy to bilateral hands.  Abrasion to left forearm, using xeroform gauze and changing daily which has been healing.    Has melanoma on right temporal and had to have this cut out, now with dressing on face.    Foot has now healed and no longer going to wound care.   Pt with neuropathy- has bilateral foot drop and unsteady gait- was seen by neurologist, Dr Jannifer Franklin and felt that balance, gait and foot drop was due to neuropathy.  Also with hx of CVA, on plavix.  Hx of right hip fracture s/p trochanteric nail fixation   Soreness in breast has improved off flomax  BPH-stable on proscar  Hyperlipidemia- never been on medication for this, LDL 195 does not wish to start more medication.   Review of Systems:  Review of Systems  Constitutional: Negative for chills and fever.    HENT: Negative for congestion, sinus pain and sore throat.        Sneezing  Eyes: Negative for blurred vision.       Glasses  Respiratory: Negative for cough, sputum production and shortness of breath.   Cardiovascular: Negative for chest pain, palpitations and leg swelling.  Gastrointestinal: Negative for abdominal pain, constipation, diarrhea, heartburn, nausea and vomiting.  Genitourinary: Negative for dysuria.  Musculoskeletal: Negative for falls, joint pain and myalgias.  Skin: Negative for itching and rash.       Pressure ulcer heel- resolved  Neurological: Positive for tremors, sensory change and weakness. Negative for dizziness and headaches.  Psychiatric/Behavioral: Negative for depression. The patient is not nervous/anxious and does not have insomnia.     Past Medical History:  Diagnosis Date  . Abnormality of gait   . Benign neoplasm of colon   . Chronic kidney disease, stage III (moderate)   . Complication of anesthesia    postop headache after general anesthesia  . Degenerative arthritis   . Diverticulosis of colon (without mention of hemorrhage)   . Duodenal ulcer, unspecified as acute or chronic, without hemorrhage, perforation, or obstruction   . Encounter for long-term (current) use of other medications   . Enlarged prostate    takes Tamsulosin daily  . Essential and other specified  forms of tremor   . Foley catheter in place 09-01-13   4 months now  . Foot drop, bilateral   . Gait disorder    balance issues-uses walker or mobile chair.  . Gout    takes Allopurinol daily  . Gout, unspecified   . Hyperlipidemia    but doesn't require meds  . Hypertension    takes Enalapril daily   . Inguinal hernia without mention of obstruction or gangrene, unilateral or unspecified, (not specified as recurrent)   . Internal hemorrhoids without mention of complication   . Macular degeneration    takes eye cap vits daily  . Other abnormal blood chemistry   . Peripheral  neuropathy   . Peripheral neuropathy   . Pneumonia    hx of;last time 8'14-mild.  . Ptosis    Left side  . Spinal stenosis, lumbar region, with neurogenic claudication   . TIA (transient ischemic attack)    sees Dr.Willis-neurologist 6'14(strange feeling of head)-no residual  . Unspecified vitamin D deficiency   . Urinary frequency   . Urinary urgency   . UTI (lower urinary tract infection) 09-01-13   tx. 2 weeks ago   Past Surgical History:  Procedure Laterality Date  . CATARACT EXTRACTION Right 08/31/2014   Dr.Lyles  . colonosocpy    . FINGER SURGERY Left    Left ring finger  . HERNIA REPAIR     at least 83yr ago  . HIP PINNING,CANNULATED Left 05/05/2013   Procedure: CANNULATED HIP PINNING- left;  Surgeon: TRenette Butters MD;  [facility]  Service: Orthopedics;  Laterality: Left;  . INGUINAL HERNIA REPAIR  09/05/2012   Procedure: LAPAROSCOPIC INGUINAL HERNIA;  Surgeon: EGayland Curry MD,FACS;  [facility]  Service: General;  Laterality: Left;  . INSERTION OF MESH  09/05/2012   Procedure: INSERTION OF MESH;  Surgeon: EGayland Curry MD,FACS;  [place] [facility]  Service: General;  Laterality: Left;  . left leg surgery  2011  . NERVE BIOPSY    . TRANSURETHRAL RESECTION OF PROSTATE N/A 09/07/2013   Procedure: CYSTO TRANSURETHRAL RESECTION OF THE PROSTATE (TURP);  Surgeon: SReece Packer MD;  Location: [facility];  Service: Urology;  Laterality: N/A;   Social History:   reports that he quit smoking about 32 years ago. His smoking use included Cigarettes. He quit smokeless tobacco use about 16 years ago. He reports that he does not drink alcohol or use drugs.  Family History  Problem Relation Age of Onset  . Diabetes Mother        type 2  . Clotting disorder Mother        deceased, blood clot  . Leukemia Father   . COPD Brother   . Lung cancer Sister   . Heart disease Brother        1/2 brother    Medications: Patient's Medications  New Prescriptions   No  medications on file  Previous Medications   ACETAMINOPHEN (TYLENOL) 500 MG TABLET    Take 500-1,000 mg by mouth every 6 (six) hours as needed.   ALLOPURINOL (ZYLOPRIM) 100 MG TABLET    TAKE ONE TABLET BY MOUTH TWICE DAILY TO  PREVENT  GOUT   B COMPLEX VITAMINS CAPSULE    Take 1 capsule by mouth daily.    CHOLECALCIFEROL (VITAMIN D3) 2000 UNITS TABS    Take by mouth daily.   CLOPIDOGREL (PLAVIX) 75 MG TABLET    TAKE ONE TABLET BY MOUTH ONCE DAILY FOR ANTICOAGULATION  FINASTERIDE (PROSCAR) 5 MG TABLET    TAKE ONE TABLET BY MOUTH ONCE DAILY TO HELP SHRINK PROSTATE.   MULTIPLE VITAMINS-MINERALS (OCUVITE ADULT 50+ PO)    Take by mouth.   PRIMIDONE (MYSOLINE) 50 MG TABLET    TAKE ONE TABLET BY MOUTH ONCE DAILY IN THE EVENING TO HELP WITH TREMORS.   SILVER SULFADIAZINE (SILVADENE) 1 % CREAM    Apply 1 application topically daily.  Modified Medications   No medications on file  Discontinued Medications   TAMSULOSIN (FLOMAX) 0.4 MG CAPS CAPSULE    Take 1 capsule (0.4 mg total) by mouth daily.     Physical Exam:  Vitals:   06/12/17 1358  BP: 118/74  Pulse: 83  Resp: 17  Temp: 97.8 F (36.6 C)  TempSrc: Oral  SpO2: 96%  Weight: 168 lb 6.4 oz (76.4 kg)  Height: _0  (1.727 m)   Body mass index is 25.61 kg/m.  Physical Exam  Constitutional: He is oriented to person, place, and time. He appears well-developed and well-nourished.  Thin, frail   HENT:  Head: Normocephalic and atraumatic.  Right Ear: External ear normal.  Left Ear: External ear normal.  Nose: Nose normal.  Eyes: Conjunctivae and EOM are normal.  Cardiovascular: Normal rate, regular rhythm and normal heart sounds.   Pulmonary/Chest: Effort normal and breath sounds normal.  Abdominal: Soft. Bowel sounds are normal.  Genitourinary: Rectum normal. Rectal exam shows guaiac negative stool.  Musculoskeletal: He exhibits no edema or tenderness.  Prior left hip fx. Unstable gait. Using walker.Muscular wasting of hand  muscles and forearm muscles and lower leg muscles. Contractures of all fingers in both hands.   Neurological: He is alert and oriented to person, place, and time. No cranial nerve deficit. Coordination abnormal.  Bilateral foot drop. Tremor. Generalized weakness based on neuromuscular disease.  Skin: Skin is warm and dry.  Psychiatric: He has a normal mood and affect. His behavior is normal. Judgment and thought content normal.    Labs reviewed: Basic Metabolic Panel:  Recent Labs  12/03/16 0908  NA 138  K 5.0  CL 102  CO2 29  GLUCOSE 81  BUN 33*  CREATININE 1.56*  CALCIUM 9.5   Liver Function Tests:  Recent Labs  12/03/16 0908  AST 19  ALT 10  ALKPHOS 70  BILITOT 0.4  PROT 6.7  ALBUMIN 3.4*   No results for input(s): LIPASE, AMYLASE in the last 8760 hours. No results for input(s): AMMONIA in the last 8760 hours. CBC: No results for input(s): WBC, NEUTROABS, HGB, HCT, MCV, PLT in the last 8760 hours. Lipid Panel:  Recent Labs  12/03/16 0908  CHOL 279*  HDL 62  LDLCALC 195*  TRIG 109  CHOLHDL 4.5   TSH: No results for input(s): TSH in the last 8760 hours. A1C: No results found for: HGBA1C   Assessment/Plan 1. Polyneuropathy in other diseases classified elsewhere [facility]) -with abnormal gait also effecting hands and grip. Wife states he was following with Dr Jannifer Franklin even though there was not much that he could do with neuromuscular disease.  - Ambulatory referral to Neurology  2. Decubitus ulcer of right heel, stage 2 Resolved.   3. Abnormality of gait -due to severe neuropathy, uses walker and is very cautious with gait however still with fall. Areas healing appropriately.  - Ambulatory referral to Neurology  4. Chronic gout without tophus, unspecified cause, unspecified site -no recent flares.  - Uric acid  5. CKD (chronic kidney disease), stage III -Encourage  proper hydration and to avoid NSAIDS (Aleve, Advil, Motrin, Ibuprofen)  - CMP with  eGFR  6. PSA elevation - PSA to follow up  7. Cerebrovascular disease On Plavix daily, hx of CVA without deficit - CBC with Differential/Platelets  8. BPH with elevated PSA -asymptomatic, following up on PSA -BPH stable on Proscar, breast tenderness improved off flomax  Next appt: 4 months for routine follow up.  Carlos American. Harle Battiest  [facility] [phone] 8 am - 5 pm) [phone] (after hours)

## 2017-06-13 LAB — CBC WITH DIFFERENTIAL/PLATELET
BASOS PCT: 0.6 %
Basophils Absolute: 43 cells/uL (ref 0–200)
EOS ABS: 223 {cells}/uL (ref 15–500)
EOS PCT: 3.1 %
HCT: 39.1 % (ref 38.5–50.0)
HEMOGLOBIN: 13.2 g/dL (ref 13.2–17.1)
Lymphs Abs: 2887 cells/uL (ref 850–3900)
MCH: 32.4 pg (ref 27.0–33.0)
MCHC: 33.8 g/dL (ref 32.0–36.0)
MCV: 95.8 fL (ref 80.0–100.0)
MONOS PCT: 5.8 %
MPV: 11.1 fL (ref 7.5–12.5)
NEUTROS ABS: 3629 {cells}/uL (ref 1500–7800)
Neutrophils Relative %: 50.4 %
PLATELETS: 161 10*3/uL (ref 140–400)
RBC: 4.08 10*6/uL — AB (ref 4.20–5.80)
RDW: 14 % (ref 11.0–15.0)
Total Lymphocyte: 40.1 %
WBC mixed population: 418 cells/uL (ref 200–950)
WBC: 7.2 10*3/uL (ref 3.8–10.8)

## 2017-06-13 LAB — COMPLETE METABOLIC PANEL WITH GFR
AG Ratio: 1.3 (calc) (ref 1.0–2.5)
ALBUMIN MSPROF: 3.5 g/dL — AB (ref 3.6–5.1)
ALT: 12 U/L (ref 9–46)
AST: 15 U/L (ref 10–35)
Alkaline phosphatase (APISO): 77 U/L (ref 40–115)
BUN / CREAT RATIO: 22 (calc) (ref 6–22)
BUN: 42 mg/dL — AB (ref 7–25)
CO2: 25 mmol/L (ref 20–32)
CREATININE: 1.92 mg/dL — AB (ref 0.70–1.11)
Calcium: 9.1 mg/dL (ref 8.6–10.3)
Chloride: 105 mmol/L (ref 98–110)
GFR, EST AFRICAN AMERICAN: 36 mL/min/{1.73_m2} — AB (ref >=60)
GFR, Est Non African American: 31 mL/min/{1.73_m2} — ABNORMAL LOW (ref >=60)
GLUCOSE: 122 mg/dL (ref 65–139)
Globulin: 2.8 g/dL (calc) (ref 1.9–3.7)
Potassium: 4.3 mmol/L (ref 3.5–5.3)
Sodium: 140 mmol/L (ref 135–146)
TOTAL PROTEIN: 6.3 g/dL (ref 6.1–8.1)
Total Bilirubin: 0.3 mg/dL (ref 0.2–1.2)

## 2017-06-13 LAB — URIC ACID: Uric Acid, Serum: 6.5 mg/dL (ref 4.0–8.0)

## 2017-06-13 LAB — PSA: PSA: 1.3 ng/mL (ref <=4.0)

## 2017-06-27 DIAGNOSIS — M6281 Muscle weakness (generalized): Secondary | ICD-10-CM | POA: Diagnosis not present

## 2017-06-27 DIAGNOSIS — M1000 Idiopathic gout, unspecified site: Secondary | ICD-10-CM | POA: Diagnosis not present

## 2017-06-27 DIAGNOSIS — M21371 Foot drop, right foot: Secondary | ICD-10-CM | POA: Diagnosis not present

## 2017-06-27 DIAGNOSIS — M21372 Foot drop, left foot: Secondary | ICD-10-CM | POA: Diagnosis not present

## 2017-07-03 ENCOUNTER — Ambulatory Visit (INDEPENDENT_AMBULATORY_CARE_PROVIDER_SITE_OTHER): Payer: Medicare Other | Admitting: Nurse Practitioner

## 2017-07-03 ENCOUNTER — Encounter: Payer: Self-pay | Admitting: Nurse Practitioner

## 2017-07-03 ENCOUNTER — Ambulatory Visit: Payer: Medicare Other | Admitting: Nurse Practitioner

## 2017-07-03 VITALS — BP 116/82 | HR 89 | Temp 98.4°F | Resp 17 | Ht 68.0 in | Wt 166.2 lb

## 2017-07-03 DIAGNOSIS — L89612 Pressure ulcer of right heel, stage 2: Secondary | ICD-10-CM

## 2017-07-03 DIAGNOSIS — R972 Elevated prostate specific antigen [PSA]: Secondary | ICD-10-CM | POA: Diagnosis not present

## 2017-07-03 DIAGNOSIS — R269 Unspecified abnormalities of gait and mobility: Secondary | ICD-10-CM | POA: Diagnosis not present

## 2017-07-03 DIAGNOSIS — N183 Chronic kidney disease, stage 3 unspecified: Secondary | ICD-10-CM

## 2017-07-03 DIAGNOSIS — Z0000 Encounter for general adult medical examination without abnormal findings: Secondary | ICD-10-CM | POA: Diagnosis not present

## 2017-07-03 DIAGNOSIS — Z23 Encounter for immunization: Secondary | ICD-10-CM

## 2017-07-03 DIAGNOSIS — M1A9XX0 Chronic gout, unspecified, without tophus (tophi): Secondary | ICD-10-CM | POA: Diagnosis not present

## 2017-07-03 NOTE — Patient Instructions (Signed)
Increase your water intake 8- 8 oz  glasses of water a day.  To avoid NSAIDS   Health Maintenance, Male A healthy lifestyle and preventive care is important for your health and wellness. Ask your health care provider about what schedule of regular examinations is right for you. What should I know about weight and diet? Eat a Healthy Diet  Eat plenty of vegetables, fruits, whole grains, low-fat dairy products, and lean protein.  Do not eat a lot of foods high in solid fats, added sugars, or salt.  Maintain a Healthy Weight Regular exercise can help you achieve or maintain a healthy weight. You should:  Do at least 150 minutes of exercise each week. The exercise should increase your heart rate and make you sweat (moderate-intensity exercise).  Do strength-training exercises at least twice a week.  Watch Your Levels of Cholesterol and Blood Lipids  Have your blood tested for lipids and cholesterol every 5 years starting at 81 years of age. If you are at high risk for heart disease, you should start having your blood tested when you are 81 years old. You may need to have your cholesterol levels checked more often if: ? Your lipid or cholesterol levels are high. ? You are older than 81 years of age. ? You are at high risk for heart disease.  What should I know about cancer screening? Many types of cancers can be detected early and may often be prevented. Lung Cancer  You should be screened every year for lung cancer if: ? You are a current smoker who has smoked for at least 30 years. ? You are a former smoker who has quit within the past 15 years.  Talk to your health care provider about your screening options, when you should start screening, and how often you should be screened.  Colorectal Cancer  Routine colorectal cancer screening usually begins at 81 years of age and should be repeated every 5-10 years until you are 81 years old. You may need to be screened more often if early  forms of precancerous polyps or small growths are found. Your health care provider may recommend screening at an earlier age if you have risk factors for colon cancer.  Your health care provider may recommend using home test kits to check for hidden blood in the stool.  A small camera at the end of a tube can be used to examine your colon (sigmoidoscopy or colonoscopy). This checks for the earliest forms of colorectal cancer.  Prostate and Testicular Cancer  Depending on your age and overall health, your health care provider may do certain tests to screen for prostate and testicular cancer.  Talk to your health care provider about any symptoms or concerns you have about testicular or prostate cancer.  Skin Cancer  Check your skin from head to toe regularly.  Tell your health care provider about any new moles or changes in moles, especially if: ? There is a change in a mole's size, shape, or color. ? You have a mole that is larger than a pencil eraser.  Always use sunscreen. Apply sunscreen liberally and repeat throughout the day.  Protect yourself by wearing long sleeves, pants, a wide-brimmed hat, and sunglasses when outside.  What should I know about heart disease, diabetes, and high blood pressure?  If you are 66-57 years of age, have your blood pressure checked every 3-5 years. If you are 34 years of age or older, have your blood pressure checked every year.  You should have your blood pressure measured twice-once when you are at a hospital or clinic, and once when you are not at a hospital or clinic. Record the average of the two measurements. To check your blood pressure when you are not at a hospital or clinic, you can use: ? An automated blood pressure machine at a pharmacy. ? A home blood pressure monitor.  Talk to your health care provider about your target blood pressure.  If you are between 92-46 years old, ask your health care provider if you should take aspirin to prevent  heart disease.  Have regular diabetes screenings by checking your fasting blood sugar level. ? If you are at a normal weight and have a low risk for diabetes, have this test once every three years after the age of 20. ? If you are overweight and have a high risk for diabetes, consider being tested at a younger age or more often.  A one-time screening for abdominal aortic aneurysm (AAA) by ultrasound is recommended for men aged 33-75 years who are current or former smokers. What should I know about preventing infection? Hepatitis B If you have a higher risk for hepatitis B, you should be screened for this virus. Talk with your health care provider to find out if you are at risk for hepatitis B infection. Hepatitis C Blood testing is recommended for:  Everyone born from [birth year] through 1963.  Anyone with known risk factors for hepatitis C.  Sexually Transmitted Diseases (STDs)  You should be screened each year for STDs including gonorrhea and chlamydia if: ? You are sexually active and are younger than 82 years of age. ? You are older than 81 years of age and your health care provider tells you that you are at risk for this type of infection. ? Your sexual activity has changed since you were last screened and you are at an increased risk for chlamydia or gonorrhea. Ask your health care provider if you are at risk.  Talk with your health care provider about whether you are at high risk of being infected with HIV. Your health care provider may recommend a prescription medicine to help prevent HIV infection.  What else can I do?  Schedule regular health, dental, and eye exams.  Stay current with your vaccines (immunizations).  Do not use any tobacco products, such as cigarettes, chewing tobacco, and e-cigarettes. If you need help quitting, ask your health care provider.  Limit alcohol intake to no more than 2 drinks per day. One drink equals 12 ounces of beer, 5 ounces of wine, or 1 ounces  of hard liquor.  Do not use street drugs.  Do not share needles.  Ask your health care provider for help if you need support or information about quitting drugs.  Tell your health care provider if you often feel depressed.  Tell your health care provider if you have ever been abused or do not feel safe at home. This information is not intended to replace advice given to you by your health care provider. Make sure you discuss any questions you have with your health care provider. Document Released: 03/15/2008 Document Revised: 05/16/2016 Document Reviewed: 06/21/2015 Elsevier Interactive Patient Education  [employer].

## 2017-07-03 NOTE — Progress Notes (Signed)
Provider: [name], NP  Patient Care Team: [name], NP as PCP - General (Geriatric Medicine)  Extended Emergency Contact Information Primary Emergency Contact: Danley,Betty Address: [address]          [place], [place] [address] Johnnette Litter of [place] Phone: [phone] Relation: Spouse Secondary Emergency Contact: Swartout,Joel Address: [address] [facility] [place], [place] [address] Johnnette Litter of [place] Phone: [phone] Relation: Son Allergies  Allergen Reactions  . Aleve [Naproxen Sodium]     Upset stomach   . Nsaids     Bother his stomach  . Antihistamines, Diphenhydramine-Type     Causes urinary issues   Code Status: DNR Goals of Care: Advanced Directive information Advanced Directives 07/03/2017  Does Patient Have a Medical Advance Directive? Yes  Type of Advance Directive Living will  Does patient want to make changes to medical advance directive? -  Copy of [place] in Chart? -  Pre-existing out of facility DNR order (yellow form or pink MOST form) -     Chief Complaint  Patient presents with  . Annual Wellness Visit    Pt is being seen for an annual wellness visit. Passed clock drawing    HPI: Patient is a 83 y.o. male seen in today for an annual wellness exam.   Major illnesses or hospitalization in the last year - right hip fracture and went to rehab which caused chronic wound to right heel- which has healed.   Depression screen [facility] 2/9 07/03/2017 01/14/2017 06/06/2016 06/29/2015 03/29/2015  Decreased Interest 0 0 0 0 0  Down, Depressed, Hopeless 0 0 0 0 0  PHQ - 2 Score 0 0 0 0 0    Fall Risk  07/03/2017 06/12/2017 03/12/2017 01/29/2017 01/14/2017  Falls in the past year? Yes Yes Yes Yes Yes  Number falls in past yr: 1 2 or more 2 or more 2 or more -  Comment - - - - -  Injury with Fall? No Yes Yes Yes No  Comment - - - - -  Risk for fall due to : - - - - -   MMSE - Mini Mental State Exam  07/03/2017  Orientation to time 4  Orientation to Place 5  Registration 3  Attention/ Calculation 4  Recall 2  Language- name 2 objects 2  Language- repeat 1  Language- follow 3 step command 3  Language- read & follow direction 1  Write a sentence 1  Copy design 1  Total score 27     Health Maintenance  Topic Date Due  . INFLUENZA VACCINE  05/01/2017  . PNA vac Low Risk Adult (2 of 2 - PPSV23) 12/05/2017  . TETANUS/TDAP  08/25/2026    Urinary incontinence? no Functional Status Survey: Is the patient deaf or have difficulty hearing?: Yes Does the patient have difficulty seeing, even when wearing glasses/contacts?: Yes (macular degeneration ) Does the patient have difficulty concentrating, remembering, or making decisions?: No Does the patient have difficulty walking or climbing stairs?: Yes Does the patient have difficulty dressing or bathing?: No Does the patient have difficulty doing errands alone such as visiting a doctor's office or shopping?: Yes (wife helps him due to mobility issues) Current Exercise Habits: Home exercise routine, Type of exercise: strength training/weights;stretching, Time (Minutes): 20, Frequency (Times/Week): 7, Weekly Exercise (Minutes/Week): 140   Diet? Heart healthy  Hearing Screening   125Hz  250Hz  500Hz  1000Hz  2000Hz  3000Hz   4000Hz  6000Hz  8000Hz   Right ear:   40 40       Left ear:   40 40       Vision Screening Comments: Pt will schedule appointment with eye doctor to do eye test.   Dentition: edentulous  Pain: none  Past Medical History:  Diagnosis Date  . Abnormality of gait   . Benign neoplasm of colon   . Chronic kidney disease, stage III (moderate) (HCC)   . Complication of anesthesia    postop headache after general anesthesia  . Degenerative arthritis   . Diverticulosis of colon (without mention of hemorrhage)   . Duodenal ulcer, unspecified as acute or chronic, without hemorrhage, perforation, or obstruction   . Encounter for  long-term (current) use of other medications   . Enlarged prostate    takes Tamsulosin daily  . Essential and other specified forms of tremor   . Foley catheter in place 09-01-13   4 months now  . Foot drop, bilateral   . Gait disorder    balance issues-uses walker or mobile chair.  . Gout    takes Allopurinol daily  . Gout, unspecified   . Hyperlipidemia    but doesn't require meds  . Hypertension    takes Enalapril daily   . Inguinal hernia without mention of obstruction or gangrene, unilateral or unspecified, (not specified as recurrent)   . Internal hemorrhoids without mention of complication   . Macular degeneration    takes eye cap vits daily  . Other abnormal blood chemistry   . Peripheral neuropathy   . Peripheral neuropathy   . Pneumonia    hx of;last time 8'14-mild.  . Ptosis    Left side  . Spinal stenosis, lumbar region, with neurogenic claudication   . TIA (transient ischemic attack)    sees Dr.Willis-neurologist 6'14(strange feeling of head)-no residual  . Unspecified vitamin D deficiency   . Urinary frequency   . Urinary urgency   . UTI (lower urinary tract infection) 09-01-13   tx. 2 weeks ago    Past Surgical History:  Procedure Laterality Date  . CATARACT EXTRACTION Right 08/31/2014   Dr.Lyles  . colonosocpy    . FINGER SURGERY Left    Left ring finger  . HERNIA REPAIR     at least 66yrs ago  . HIP PINNING,CANNULATED Left 05/05/2013   Procedure: CANNULATED HIP PINNING- left;  Surgeon: Renette Butters, MD;  Location: [facility];  Service: Orthopedics;  Laterality: Left;  . INGUINAL HERNIA REPAIR  09/05/2012   Procedure: LAPAROSCOPIC INGUINAL HERNIA;  Surgeon: Gayland Curry, MD,FACS;  Location: [facility];  Service: General;  Laterality: Left;  . INSERTION OF MESH  09/05/2012   Procedure: INSERTION OF MESH;  Surgeon: Gayland Curry, MD,FACS;  Location: [facility];  Service: General;  Laterality: Left;  . left leg surgery  2011  . NERVE BIOPSY    . TRANSURETHRAL  RESECTION OF PROSTATE N/A 09/07/2013   Procedure: CYSTO TRANSURETHRAL RESECTION OF THE PROSTATE (TURP);  Surgeon: Reece Packer, MD;  Location: [facility];  Service: Urology;  Laterality: N/A;    Social History   Social History  . Marital status: Married    Spouse name: N/A  . Number of children: 2  . Years of education: 12   Occupational History  . 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 Smoking status: Former Smoker    Types: Cigarettes    Quit date: 05/10/1985  .  Smokeless tobacco: Former Systems developer    Quit date: 02/07/2001  . Alcohol use No  . Drug use: No  . Sexual activity: Not Currently   Other Topics Concern  . None   Social History Narrative   Lives with wife Inez Catalina   Former smoker stopped 1986   Alcohol none   Exercise none       Family History  Problem Relation Age of Onset  . Diabetes Mother        type 2  . Clotting disorder Mother        deceased, blood clot  . Leukemia Father   . COPD Brother   . Lung cancer Sister   . Heart disease Brother        1/2 brother    Review of Systems:  Review of Systems  Constitutional: Positive for fatigue. Negative for fever.  Eyes: Positive for visual disturbance (corrective lenses).       Macular degeneration   Respiratory: Negative for cough, shortness of breath and wheezing.   Cardiovascular: Negative for chest pain, palpitations and leg swelling.  Gastrointestinal: Negative for abdominal pain, blood in stool and diarrhea.       Episodes of aspiration. Swallowing has been affected by his neuro muscular disease.   Endocrine: Negative.   Genitourinary: Negative for difficulty urinating.       Issues with nocturia and urgency.   Musculoskeletal: Negative for arthralgias.         S/P left hip fx.2014 and right hip fx Nov 2017. Chronic bilateral foot drop. Uses AFO regularly inside his shoe.   Neurological: Positive for numbness. Negative for dizziness, light-headedness and headaches.        Polyneuropathy. Bilateral foot drop.  Hematological:       Hx Anemia.  Psychiatric/Behavioral: Negative.      Allergies as of 07/03/2017      Reactions   Aleve [naproxen Sodium]    Upset stomach    Nsaids    Bother his stomach   Antihistamines, Diphenhydramine-type    Causes urinary issues      Medication List       Accurate as of 07/03/17  9:30 AM. Always use your most recent med list.          acetaminophen 500 MG tablet Commonly known as:  TYLENOL Take 500-1,000 mg by mouth every 6 (six) hours as needed.   allopurinol 100 MG tablet Commonly known as:  ZYLOPRIM TAKE ONE TABLET BY MOUTH TWICE DAILY TO  PREVENT  GOUT   b complex vitamins capsule Take 1 capsule by mouth daily.   clopidogrel 75 MG tablet Commonly known as:  PLAVIX TAKE ONE TABLET BY MOUTH ONCE DAILY FOR ANTICOAGULATION   finasteride 5 MG tablet Commonly known as:  PROSCAR TAKE ONE TABLET BY MOUTH ONCE DAILY TO HELP SHRINK PROSTATE.   primidone 50 MG tablet Commonly known as:  MYSOLINE TAKE ONE TABLET BY MOUTH ONCE DAILY IN THE EVENING TO HELP WITH TREMORS.   Vitamin D3 2000 units Tabs Take by mouth daily.         Physical Exam: Vitals:   07/03/17 0902  BP: 116/82  Pulse: 89  Resp: 17  Temp: 98.4 F (36.9 C)  TempSrc: Oral  SpO2: 96%  Weight: 166 lb 3.2 oz (75.4 kg)  Height: 5\' 8"  (1.727 m)   Body mass index is 25.27 kg/m. Physical Exam  Constitutional: He is oriented to person, place, and time. He appears well-developed and well-nourished.  Thin, frail  HENT:  Head: Normocephalic and atraumatic.  Right Ear: External ear normal.  Left Ear: External ear normal.  Nose: Nose normal.  Mouth/Throat: Oropharynx is clear and moist. No oropharyngeal exudate.  Eyes: Pupils are equal, round, and reactive to light. Conjunctivae and EOM are normal.  Neck: Normal range of motion. Neck supple. No JVD present. No tracheal deviation present. No thyromegaly present.  Cardiovascular: Normal  rate, regular rhythm, normal heart sounds and intact distal pulses.   Pulmonary/Chest: Effort normal and breath sounds normal.  Abdominal: Soft. Bowel sounds are normal. He exhibits no distension. There is no tenderness.  Genitourinary: Rectum normal. Rectal exam shows guaiac negative stool.  Musculoskeletal: He exhibits no edema or tenderness.  Prior left hip fx. Unstable gait. Using walker.Muscular wasting of hand muscles and forearm muscles and lower leg muscles. Contractures of all fingers in both hands.  Lymphadenopathy:    He has no cervical adenopathy.  Neurological: He is alert and oriented to person, place, and time. No cranial nerve deficit. He exhibits abnormal muscle tone. Coordination abnormal.  Bilateral foot drop. Tremor. Generalized weakness based on neuromuscular disease.  Skin: Skin is warm and dry. No rash noted. No erythema. No pallor.  Psychiatric: He has a normal mood and affect. His behavior is normal. Judgment and thought content normal.    Labs reviewed: Basic Metabolic Panel:  Recent Labs  12/03/16 0908 06/12/17 1448  NA 138 140  K 5.0 4.3  CL 102 105  CO2 29 25  GLUCOSE 81 122  BUN 33* 42*  CREATININE 1.56* 1.92*  CALCIUM 9.5 9.1   Liver Function Tests:  Recent Labs  12/03/16 0908 06/12/17 1448  AST 19 15  ALT 10 12  ALKPHOS 70  --   BILITOT 0.4 0.3  PROT 6.7 6.3  ALBUMIN 3.4*  --    No results for input(s): LIPASE, AMYLASE in the last 8760 hours. No results for input(s): AMMONIA in the last 8760 hours. CBC:  Recent Labs  06/12/17 1448  WBC 7.2  NEUTROABS 3,629  HGB 13.2  HCT 39.1  MCV 95.8  PLT 161   Lipid Panel:  Recent Labs  12/03/16 0908  CHOL 279*  HDL 62  LDLCALC 195*  TRIG 109  CHOLHDL 4.5   No results found for: HGBA1C  Procedures: No results found.  Assessment/Plan 1. Medicare annual wellness visit, subsequent The patient was counseled regarding the prevention of dental and periodontal disease, diet,  regular sustained exercise for at least 30 minutes 5 times per week,  basis,smoking cessation, tobacco use, and recommended schedule for GI hemoccult testing, colonoscopy, cholesterol, thyroid and diabetes screening. -MMSE 27/30 -living with wife who helps him when needed.   2. Decubitus ulcer of right heel, stage 2 -healed, to cont preventative measures.   3. Abnormality of gait -recurrent falls due to progression of neuromuscular disease, follow up with neurologist has been stable.   4. CKD (chronic kidney disease), stage III (HCC) -worsening renal function. Encourage proper hydration and to avoid NSAIDS (Aleve, Advil, Motrin, Ibuprofen)   5. PSA elevation PSA stable at 1.3.   6. Chronic gout without tophus, unspecified cause, unspecified site -no recent flares, uric acid stable.   7. Need for immunization against influenza - Flu vaccine HIGH DOSE PF (Fluzone High dose)  Next appt: 6 months sooner if needed Kenden Brandt K. Harle Battiest  [facility] [phone] 8 am - 5 pm) [phone] (after hours)

## 2017-07-10 DIAGNOSIS — L84 Corns and callosities: Secondary | ICD-10-CM | POA: Diagnosis not present

## 2017-07-10 DIAGNOSIS — M79672 Pain in left foot: Secondary | ICD-10-CM | POA: Diagnosis not present

## 2017-07-10 DIAGNOSIS — B351 Tinea unguium: Secondary | ICD-10-CM | POA: Diagnosis not present

## 2017-07-10 DIAGNOSIS — M79671 Pain in right foot: Secondary | ICD-10-CM | POA: Diagnosis not present

## 2017-07-27 DIAGNOSIS — M6281 Muscle weakness (generalized): Secondary | ICD-10-CM | POA: Diagnosis not present

## 2017-07-27 DIAGNOSIS — M21371 Foot drop, right foot: Secondary | ICD-10-CM | POA: Diagnosis not present

## 2017-07-27 DIAGNOSIS — M1000 Idiopathic gout, unspecified site: Secondary | ICD-10-CM | POA: Diagnosis not present

## 2017-07-27 DIAGNOSIS — M21372 Foot drop, left foot: Secondary | ICD-10-CM | POA: Diagnosis not present

## 2017-08-12 ENCOUNTER — Other Ambulatory Visit: Payer: Self-pay | Admitting: Internal Medicine

## 2017-08-27 ENCOUNTER — Ambulatory Visit: Payer: Medicare Other | Admitting: Neurology

## 2017-08-27 DIAGNOSIS — Z85820 Personal history of malignant melanoma of skin: Secondary | ICD-10-CM | POA: Diagnosis not present

## 2017-08-27 DIAGNOSIS — D225 Melanocytic nevi of trunk: Secondary | ICD-10-CM | POA: Diagnosis not present

## 2017-08-27 DIAGNOSIS — L570 Actinic keratosis: Secondary | ICD-10-CM | POA: Diagnosis not present

## 2017-08-27 DIAGNOSIS — L821 Other seborrheic keratosis: Secondary | ICD-10-CM | POA: Diagnosis not present

## 2017-08-29 DIAGNOSIS — L89892 Pressure ulcer of other site, stage 2: Secondary | ICD-10-CM | POA: Diagnosis not present

## 2017-08-29 DIAGNOSIS — L03115 Cellulitis of right lower limb: Secondary | ICD-10-CM | POA: Diagnosis not present

## 2017-08-29 DIAGNOSIS — M79671 Pain in right foot: Secondary | ICD-10-CM | POA: Diagnosis not present

## 2017-09-03 ENCOUNTER — Encounter: Payer: Self-pay | Admitting: Nurse Practitioner

## 2017-09-03 ENCOUNTER — Ambulatory Visit (INDEPENDENT_AMBULATORY_CARE_PROVIDER_SITE_OTHER): Payer: Medicare Other | Admitting: Nurse Practitioner

## 2017-09-03 VITALS — BP 122/76 | HR 62 | Temp 98.4°F | Resp 17 | Ht 68.0 in | Wt 169.0 lb

## 2017-09-03 DIAGNOSIS — G63 Polyneuropathy in diseases classified elsewhere: Secondary | ICD-10-CM | POA: Diagnosis not present

## 2017-09-03 DIAGNOSIS — H02402 Unspecified ptosis of left eyelid: Secondary | ICD-10-CM | POA: Diagnosis not present

## 2017-09-03 NOTE — Progress Notes (Signed)
Careteam: Patient Care Team: [name], NP as PCP - General (Geriatric Medicine)   Allergies  Allergen Reactions  . Aleve [Naproxen Sodium]     Upset stomach   . Nsaids     Bother his stomach  . Antihistamines, Diphenhydramine-Type     Causes urinary issues    Chief Complaint  Patient presents with  . Acute Visit    Pt is being seen due to left eye droop that started this morning. Pt reports several headaches (3-4) since last week over left eye.   . Wife in room     HPI: Patient is a 83 y.o. male seen in the office today due to left eye droop. Wife and pt unsure when this started exactly. Wears glasses. wife states that she felt like she had noticed something different for a few days but really noticed this morning.  Has had an hx of CVA and TIA in the past, neuromuscular disease.  Went to see foot doctor and he trim callus from foot ulcer.  He placed him on silvadene and Augmentin twice daily.  Pt reports he has floaters in right eye (has MD) but has not had any floaters the last 2 mornings.  No visual changes in the left eye, no drainage, no pain to eye or surrounding tissue. No current headache but has had them in the past.  Wanted to get in with neurologist but had to go to his brothers funeral.  No numbness or tingling to left side.  No weakness.  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  Review of Systems:  Review of Systems  Constitutional: Negative for chills and fever.  HENT: Negative for congestion, ear discharge, ear pain and sinus pain.   Eyes: Negative for blurred vision, double vision, photophobia, pain, discharge and redness.       Glasses  Respiratory: Negative for cough, sputum production and shortness of breath.   Cardiovascular: Negative for chest pain, palpitations and leg swelling.  Musculoskeletal: Negative for falls.  Skin: Negative for itching and rash.       Pressure ulcer heel-  resolved  Neurological: Positive for tremors, sensory change and weakness. Negative for dizziness, tingling, speech change, focal weakness, loss of consciousness and headaches.  Psychiatric/Behavioral: The patient does not have insomnia.     Past Medical History:  Diagnosis Date  . Abnormality of gait   . Benign neoplasm of colon   . Chronic kidney disease, stage III (moderate) (HCC)   . Complication of anesthesia    postop headache after general anesthesia  . Degenerative arthritis   . Diverticulosis of colon (without mention of hemorrhage)   . Duodenal ulcer, unspecified as acute or chronic, without hemorrhage, perforation, or obstruction   . Encounter for long-term (current) use of other medications   . Enlarged prostate    takes Tamsulosin daily  . Essential and other specified forms of tremor   . Foley catheter in place 09-01-13   4 months now  . Foot drop, bilateral   . Gait disorder    balance issues-uses walker or mobile chair.  . Gout    takes Allopurinol daily  . Gout, unspecified   . Hyperlipidemia    but doesn't require meds  . Hypertension    takes Enalapril daily   . Inguinal hernia without mention of obstruction or gangrene, unilateral or unspecified, (not specified as recurrent)   . Internal hemorrhoids without mention of complication   .  Macular degeneration    takes eye cap vits daily  . Other abnormal blood chemistry   . Peripheral neuropathy   . Peripheral neuropathy   . Pneumonia    hx of;last time 8'14-mild.  . Ptosis    Left side  . Spinal stenosis, lumbar region, with neurogenic claudication   . TIA (transient ischemic attack)    sees Dr.Willis-neurologist 6'14(strange feeling of head)-no residual  . Unspecified vitamin D deficiency   . Urinary frequency   . Urinary urgency   . UTI (lower urinary tract infection) 09-01-13   tx. 2 weeks ago   Past Surgical History:  Procedure Laterality Date  . CATARACT EXTRACTION Right 08/31/2014   Dr.Lyles    . colonosocpy    . FINGER SURGERY Left    Left ring finger  . HERNIA REPAIR     at least 22yrs ago  . HIP PINNING,CANNULATED Left 05/05/2013   Procedure: CANNULATED HIP PINNING- left;  Surgeon: Renette Butters, MD;  Location: [facility];  Service: Orthopedics;  Laterality: Left;  . INGUINAL HERNIA REPAIR  09/05/2012   Procedure: LAPAROSCOPIC INGUINAL HERNIA;  Surgeon: Gayland Curry, MD,FACS;  Location: [facility];  Service: General;  Laterality: Left;  . INSERTION OF MESH  09/05/2012   Procedure: INSERTION OF MESH;  Surgeon: Gayland Curry, MD,FACS;  Location: [facility];  Service: General;  Laterality: Left;  . left leg surgery  2011  . NERVE BIOPSY    . TRANSURETHRAL RESECTION OF PROSTATE N/A 09/07/2013   Procedure: CYSTO TRANSURETHRAL RESECTION OF THE PROSTATE (TURP);  Surgeon: Reece Packer, MD;  Location: [facility];  Service: Urology;  Laterality: N/A;   Social History:   reports that he quit smoking about 32 years ago. His smoking use included cigarettes. He quit smokeless tobacco use about 16 years ago. He reports that he does not drink alcohol or use drugs.  Family History  Problem Relation Age of Onset  . Diabetes Mother        type 2  . Clotting disorder Mother        deceased, blood clot  . Leukemia Father   . COPD Brother   . Lung cancer Sister   . Heart disease Brother        1/2 brother    Medications:   Medication List        Accurate as of 09/03/17  4:10 PM. Always use your most recent med list.          acetaminophen 500 MG tablet Commonly known as:  TYLENOL   allopurinol 100 MG tablet Commonly known as:  ZYLOPRIM TAKE ONE TABLET BY MOUTH TWICE DAILY TO  PREVENT  GOUT   amoxicillin-clavulanate 875-125 MG tablet Commonly known as:  AUGMENTIN   b complex vitamins capsule   clopidogrel 75 MG tablet Commonly known as:  PLAVIX TAKE 1 TABLET BY MOUTH ONCE DAILY FOR ANTICOAGULATION   finasteride 5 MG tablet Commonly known as:  PROSCAR TAKE ONE TABLET BY MOUTH ONCE  DAILY TO HELP SHRINK PROSTATE.   primidone 50 MG tablet Commonly known as:  MYSOLINE TAKE ONE TABLET BY MOUTH ONCE DAILY IN THE EVENING TO HELP WITH TREMORS.   Vitamin D3 2000 units Tabs        Physical Exam:  Vitals:   09/03/17 1535  BP: 122/76  Pulse: 62  Resp: 17  Temp: 98.4 F (36.9 C)  TempSrc: Oral  SpO2: 96%  Weight: 169 lb (76.7 kg)  Height: 5\' 8"  (1.727  m)   Body mass index is 25.7 kg/m.  Physical Exam  Constitutional: He is oriented to person, place, and time. He appears well-developed and well-nourished.  Thin, frail   HENT:  Head: Normocephalic and atraumatic.  Right Ear: External ear normal.  Left Ear: External ear normal.  Nose: Nose normal.  Mouth/Throat: Oropharynx is clear and moist.  Eyes: Conjunctivae and EOM are normal. Pupils are equal, round, and reactive to light. Left eye exhibits no chemosis, no discharge and no exudate. No foreign body present in the left eye.  Left eyelid droop, loss of crease when relaxed, able to open eye further on exam  Cardiovascular: Normal rate, regular rhythm and normal heart sounds.  Pulmonary/Chest: Effort normal and breath sounds normal.  Abdominal: Soft. Bowel sounds are normal.  Genitourinary: Rectum normal. Rectal exam shows guaiac negative stool.  Musculoskeletal: He exhibits no edema or tenderness.  Prior left hip fx. Unstable gait. Using walker.Muscular wasting of hand muscles and forearm muscles and lower leg muscles. Contractures of all fingers in both hands.   Neurological: He is alert and oriented to person, place, and time. No cranial nerve deficit. Coordination abnormal.  Bilateral foot drop. Tremor. Generalized weakness based on neuromuscular disease.  Skin: Skin is warm and dry.  Psychiatric: He has a normal mood and affect. His behavior is normal. Judgment and thought content normal.    Labs reviewed: Basic Metabolic Panel: Recent Labs    12/03/16 0908 06/12/17 1448  NA 138 140  K 5.0  4.3  CL 102 105  CO2 29 25  GLUCOSE 81 122  BUN 33* 42*  CREATININE 1.56* 1.92*  CALCIUM 9.5 9.1   Liver Function Tests: Recent Labs    12/03/16 0908 06/12/17 1448  AST 19 15  ALT 10 12  ALKPHOS 70  --   BILITOT 0.4 0.3  PROT 6.7 6.3  ALBUMIN 3.4*  --    No results for input(s): LIPASE, AMYLASE in the last 8760 hours. No results for input(s): AMMONIA in the last 8760 hours. CBC: Recent Labs    06/12/17 1448  WBC 7.2  NEUTROABS 3,629  HGB 13.2  HCT 39.1  MCV 95.8  PLT 161   Lipid Panel: Recent Labs    12/03/16 0908  CHOL 279*  HDL 62  LDLCALC 195*  TRIG 109  CHOLHDL 4.5   TSH: No results for input(s): TSH in the last 8760 hours. A1C: No results found for: HGBA1C   Assessment/Plan 1. Ptosis of left eyelid -no signs of CVA/TIA, no numbness tingling noted or other symptoms associated with bells palsy , no redness or swelling associated with cellulitis. Discussed with Dr Eulas Post and at this time will refer to neuro ophthalmology. Wife reports they have appt with ophthalmologist scheduled for next month as well. No sooner appts available.  - Ambulatory referral to Ophthalmology  2. Polyneuropathy in other diseases classified elsewhere [facility]) Had to reschedule appt with neurologist, no acute changes with weakness noted.     Carlos American. Harle Battiest  [facility] [phone] 8 am - 5 pm) [phone] (after hours)

## 2017-09-03 NOTE — Patient Instructions (Signed)
referal placed to neuro ophthalmologist due to left eyelid drooo.

## 2017-09-13 ENCOUNTER — Other Ambulatory Visit: Payer: Self-pay | Admitting: Internal Medicine

## 2017-09-13 DIAGNOSIS — M1A9XX0 Chronic gout, unspecified, without tophus (tophi): Secondary | ICD-10-CM

## 2017-09-13 DIAGNOSIS — G250 Essential tremor: Secondary | ICD-10-CM

## 2017-10-09 DIAGNOSIS — M79671 Pain in right foot: Secondary | ICD-10-CM | POA: Diagnosis not present

## 2017-10-09 DIAGNOSIS — M79672 Pain in left foot: Secondary | ICD-10-CM | POA: Diagnosis not present

## 2017-10-09 DIAGNOSIS — L84 Corns and callosities: Secondary | ICD-10-CM | POA: Diagnosis not present

## 2017-10-09 DIAGNOSIS — B351 Tinea unguium: Secondary | ICD-10-CM | POA: Diagnosis not present

## 2017-10-22 ENCOUNTER — Encounter: Payer: Self-pay | Admitting: Neurology

## 2017-10-22 ENCOUNTER — Telehealth: Payer: Self-pay | Admitting: Neurology

## 2017-10-22 NOTE — Telephone Encounter (Signed)
Pt. States he does not need a follow-up due to medication not working.

## 2017-10-24 DIAGNOSIS — H353212 Exudative age-related macular degeneration, right eye, with inactive choroidal neovascularization: Secondary | ICD-10-CM | POA: Diagnosis not present

## 2017-10-24 DIAGNOSIS — Z961 Presence of intraocular lens: Secondary | ICD-10-CM | POA: Diagnosis not present

## 2017-10-24 DIAGNOSIS — H353122 Nonexudative age-related macular degeneration, left eye, intermediate dry stage: Secondary | ICD-10-CM | POA: Diagnosis not present

## 2017-10-31 DIAGNOSIS — H353122 Nonexudative age-related macular degeneration, left eye, intermediate dry stage: Secondary | ICD-10-CM | POA: Diagnosis not present

## 2017-10-31 DIAGNOSIS — H43813 Vitreous degeneration, bilateral: Secondary | ICD-10-CM | POA: Diagnosis not present

## 2017-10-31 DIAGNOSIS — H353211 Exudative age-related macular degeneration, right eye, with active choroidal neovascularization: Secondary | ICD-10-CM | POA: Diagnosis not present

## 2017-12-02 ENCOUNTER — Other Ambulatory Visit: Payer: Self-pay | Admitting: Internal Medicine

## 2017-12-02 DIAGNOSIS — R972 Elevated prostate specific antigen [PSA]: Secondary | ICD-10-CM

## 2017-12-19 ENCOUNTER — Ambulatory Visit: Payer: Medicare Other | Admitting: Neurology

## 2017-12-19 ENCOUNTER — Other Ambulatory Visit: Payer: Self-pay

## 2017-12-19 ENCOUNTER — Encounter: Payer: Self-pay | Admitting: Neurology

## 2017-12-19 VITALS — BP 156/85 | HR 88 | Ht 68.0 in | Wt 173.0 lb

## 2017-12-19 DIAGNOSIS — R269 Unspecified abnormalities of gait and mobility: Secondary | ICD-10-CM

## 2017-12-19 DIAGNOSIS — L820 Inflamed seborrheic keratosis: Secondary | ICD-10-CM | POA: Diagnosis not present

## 2017-12-19 DIAGNOSIS — M21371 Foot drop, right foot: Secondary | ICD-10-CM

## 2017-12-19 DIAGNOSIS — M21372 Foot drop, left foot: Secondary | ICD-10-CM

## 2017-12-19 DIAGNOSIS — G63 Polyneuropathy in diseases classified elsewhere: Secondary | ICD-10-CM

## 2017-12-19 DIAGNOSIS — D485 Neoplasm of uncertain behavior of skin: Secondary | ICD-10-CM | POA: Diagnosis not present

## 2017-12-19 NOTE — Progress Notes (Signed)
Reason for visit: Peripheral neuropathy  Referring physician: Dr. Ardelia Gibson is a 84 y.o. male  History of present illness:  Drew Gibson is an 84 year old right-handed white male with a history of a severe peripheral neuropathy associated with hand and foot weakness and a severe gait disorder.  The patient has had difficulty with falling backwards frequently, since last seen in 2014 he has had falls with hip fractures bilaterally, one in 2014, another in 2017.  The patient has last been seen in 2014, he had a right frontal stroke around that time.  He remains on Plavix.  He uses a walker for ambulation.  He denies any significant discomfort with a peripheral neuropathy.  He denies issues controlling the bowels or the bladder.  He is able to sleep fairly well at night.  He denies any blackout episodes.  He does have some occasional swelling in the left hand for some reason.  He has had arterial studies on the legs that showed poor circulation.  He comes to this office for reevaluation.  Past Medical History:  Diagnosis Date  . Abnormality of gait   . Benign neoplasm of colon   . Chronic kidney disease, stage III (moderate) (HCC)   . Complication of anesthesia    postop headache after general anesthesia  . Degenerative arthritis   . Diverticulosis of colon (without mention of hemorrhage)   . Duodenal ulcer, unspecified as acute or chronic, without hemorrhage, perforation, or obstruction   . Encounter for long-term (current) use of other medications   . Enlarged prostate    takes Tamsulosin daily  . Essential and other specified forms of tremor   . Foley catheter in place 09-01-13   4 months now  . Foot drop, bilateral   . Gait disorder    balance issues-uses walker or mobile chair.  . Gout    takes Allopurinol daily  . Gout, unspecified   . Hyperlipidemia    but doesn't require meds  . Hypertension    takes Enalapril daily   . Inguinal hernia without mention of  obstruction or gangrene, unilateral or unspecified, (not specified as recurrent)   . Internal hemorrhoids without mention of complication   . Macular degeneration    takes eye cap vits daily  . Other abnormal blood chemistry   . Peripheral neuropathy   . Peripheral neuropathy   . Pneumonia    hx of;last time 8'14-mild.  . Ptosis    Left side  . Spinal stenosis, lumbar region, with neurogenic claudication   . TIA (transient ischemic attack)    sees Drew Gibson-neurologist 6'14(strange feeling of head)-no residual  . Unspecified vitamin D deficiency   . Urinary frequency   . Urinary urgency   . UTI (lower urinary tract infection) 09-01-13   tx. 2 weeks ago    Past Surgical History:  Procedure Laterality Date  . CATARACT EXTRACTION Right 08/31/2014   Drew Gibson  . colonosocpy    . FINGER SURGERY Left    Left ring finger  . HERNIA REPAIR     at least 58yrs ago  . HIP PINNING,CANNULATED Left 05/05/2013   Procedure: CANNULATED HIP PINNING- left;  Surgeon: Renette Butters, MD;  Location: [facility];  Service: Orthopedics;  Laterality: Left;  . INGUINAL HERNIA REPAIR  09/05/2012   Procedure: LAPAROSCOPIC INGUINAL HERNIA;  Surgeon: Gayland Curry, MD,FACS;  Location: [facility];  Service: General;  Laterality: Left;  . INSERTION OF MESH  09/05/2012   Procedure:  INSERTION OF MESH;  Surgeon: Gayland Curry, MD,FACS;  Location: [facility];  Service: General;  Laterality: Left;  . left leg surgery  2011  . NERVE BIOPSY    . TRANSURETHRAL RESECTION OF PROSTATE N/A 09/07/2013   Procedure: CYSTO TRANSURETHRAL RESECTION OF THE PROSTATE (TURP);  Surgeon: Reece Packer, MD;  Location: [facility];  Service: Urology;  Laterality: N/A;    Family History  Problem Relation Age of Onset  . Diabetes Mother        type 2  . Clotting disorder Mother        deceased, blood clot  . Leukemia Father   . COPD Brother   . Lung cancer Sister   . Heart disease Brother        1/2 brother    Social history:  reports that he  quit smoking about 32 years ago. His smoking use included cigarettes. He quit smokeless tobacco use about 16 years ago. He reports that he does not drink alcohol or use drugs.  Medications:  Prior to Admission medications   Medication Sig Start Date End Date Taking? Authorizing Provider  acetaminophen (TYLENOL) 500 MG tablet Take 500-1,000 mg by mouth every 6 (six) hours as needed.   Yes [provider]  allopurinol (ZYLOPRIM) 100 MG tablet TAKE 1 TABLET BY MOUTH TWICE DAILY TO PREVENT GOUT. 09/13/17  Yes [name], NP  b complex vitamins capsule Take 1 capsule by mouth daily.    Yes [provider]  Cholecalciferol (VITAMIN D3) 2000 UNITS TABS Take by mouth daily.   Yes [provider]  clopidogrel (PLAVIX) 75 MG tablet TAKE 1 TABLET BY MOUTH ONCE DAILY FOR ANTICOAGULATION 08/12/17  Yes [name], NP  finasteride (PROSCAR) 5 MG tablet TAKE 1 TABLET BY MOUTH ONCE DAILY TO  HELP  SHRINK  PROSTATE 12/02/17  Yes [name], NP  OVER THE COUNTER MEDICATION Take 1 capsule by mouth daily. VISION ALIGN   Yes [provider]  primidone (MYSOLINE) 50 MG tablet TAKE 1 TABLET BY MOUTH ONCE DAILY IN THE EVENING TO HELP WITH TREMORS. 09/13/17  Yes [name], NP      Allergies  Allergen Reactions  . Aleve [Naproxen Sodium]     Upset stomach   . Nsaids     Bother his stomach  . Antihistamines, Diphenhydramine-Type     Causes urinary issues    ROS:  Out of a complete 14 system review of symptoms, the patient complains only of the following symptoms, and all other reviewed systems are negative.  Blurring of vision, macular degeneration Walking difficulty Snoring Headache, numbness, weakness, tremors  Blood pressure (!) 156/85, pulse 88, height 5\' 8"  (1.727 m), weight 173 lb (78.5 kg).  Physical Exam  General: The patient is alert and cooperative at the time of the examination.  Eyes: Pupils are equal, round, and reactive to  light. Discs are flat bilaterally.  Neck: The neck is supple, no carotid bruits are noted.  Respiratory: The respiratory examination is clear.  Cardiovascular: The cardiovascular examination reveals a regular rate and rhythm, no obvious murmurs or rubs are noted.  Skin: Extremities are with 1+ edema below the knees.  The patient has atrophy of the extremities below the knees, atrophy of both hands.  Neurologic Exam  Mental status: The patient is alert and oriented x 3 at the time of the examination. The patient has apparent normal recent and remote memory, with an apparently normal attention span and  concentration ability.  Cranial nerves: Facial symmetry is present. There is good sensation of the face to pinprick and soft touch bilaterally. The strength of the facial muscles and the muscles to head turning and shoulder shrug are normal bilaterally. Speech is well enunciated, no aphasia or dysarthria is noted. Extraocular movements are full. Visual fields are full. The tongue is midline, and the patient has symmetric elevation of the soft palate. No obvious hearing deficits are noted.  Motor: The motor testing reveals 5 over 5 strength of all 4 extremities, with exception of some weakness with intrinsic muscles of the hands bilaterally and some weakness with grip.  The patient has prominent bilateral foot drops.  May have some slight weakness with hip flexion bilaterally. Good symmetric motor tone is noted throughout.  Sensory: Sensory testing is intact to pinprick, soft touch, vibration sensation, and position sense on the upper extremities.  With the lower extremities there is a stocking pattern pinprick sensory deficit across the ankles with impairment of vibration sensation in both feet, mild impairment of position sense in both feet.  No evidence of extinction is noted.  Coordination: Cerebellar testing reveals good finger-nose-finger and heel-to-shin bilaterally.  Gait and station: Gait  is wide-based, unsteady.  The patient has a tendency to go backwards.  The patient walks with a walker.  Tandem gait was not attempted.  Reflexes: Deep tendon reflexes are symmetric, but are depressed bilaterally. Toes are downgoing bilaterally.   MRI brain 03/20/13:  IMPRESSION:  Abnormal MRI brain (without) demonstrating: 1. Subacute right posterior frontal ischemic infarction (1.1cm). 2. Moderate periventricular and subcortical chronic small vessel ischemic disease. 3. Multiple chronic cerebral microhemorrhages noted in the bilateral cerebral hemispheres, may reflect amyloid angiopathy or chronic small vessel ischemic disease. 4. Mild diffuse and moderate mesial temporal atrophy. 5. Compared to MRI on 10/23/07, the right frontal subacute infarct is a new finding. Also, there is progression of atrophy and chronic small vessel ischemic disease.  * MRI scan images were reviewed online. I agree with the written report.    Assessment/Plan:  1.  Peripheral neuropathy  2.  Gait disorder  3.  Cerebrovascular disease  The patient will be getting a power wheelchair in the near future.  The patient has prominent bilateral foot drops, he does not use his AFO braces.  We will give him a prescription for bilateral ankle support braces to help him walk.  He has a tendency to lean backwards even with sitting.  I will get home health physical therapy to work on strengthening exercises to improve abdominal muscle strength and hip flexion strength. The patient will follow-up in 6 months.  Jill Alexanders MD 12/19/2017 10:30 AM  [facility] [address] [place], [place] [address]  Phone [phone] Fax [phone]

## 2017-12-20 ENCOUNTER — Telehealth: Payer: Self-pay | Admitting: Neurology

## 2017-12-20 NOTE — Telephone Encounter (Signed)
Pt called he said the bil ankle support is the same thing he already has and is unable to get his shoes on with them. He is wanting to if there is anything else he could use. Please call to advise

## 2017-12-20 NOTE — Telephone Encounter (Signed)
The patient went to [facility], it appears that he looked at AFO braces rather than ankle support braces.  He did find one place that sells lace up braces, this is what I was talking about, he will try to get these ankle support braces.

## 2017-12-30 ENCOUNTER — Telehealth: Payer: Self-pay | Admitting: Neurology

## 2017-12-30 NOTE — Telephone Encounter (Signed)
Patient's wife calling to discuss a referral for home health that was requested by Dr. Jannifer [place].

## 2017-12-31 ENCOUNTER — Ambulatory Visit: Payer: Medicare Other | Admitting: Nurse Practitioner

## 2017-12-31 ENCOUNTER — Encounter: Payer: Self-pay | Admitting: Nurse Practitioner

## 2017-12-31 ENCOUNTER — Ambulatory Visit (INDEPENDENT_AMBULATORY_CARE_PROVIDER_SITE_OTHER): Payer: Medicare Other | Admitting: Nurse Practitioner

## 2017-12-31 VITALS — BP 148/84 | HR 74 | Temp 98.4°F | Ht 68.0 in | Wt 169.0 lb

## 2017-12-31 DIAGNOSIS — R269 Unspecified abnormalities of gait and mobility: Secondary | ICD-10-CM | POA: Diagnosis not present

## 2017-12-31 DIAGNOSIS — M1A9XX0 Chronic gout, unspecified, without tophus (tophi): Secondary | ICD-10-CM | POA: Diagnosis not present

## 2017-12-31 DIAGNOSIS — N183 Chronic kidney disease, stage 3 unspecified: Secondary | ICD-10-CM

## 2017-12-31 DIAGNOSIS — N400 Enlarged prostate without lower urinary tract symptoms: Secondary | ICD-10-CM | POA: Diagnosis not present

## 2017-12-31 DIAGNOSIS — G63 Polyneuropathy in diseases classified elsewhere: Secondary | ICD-10-CM | POA: Diagnosis not present

## 2017-12-31 DIAGNOSIS — G250 Essential tremor: Secondary | ICD-10-CM | POA: Diagnosis not present

## 2017-12-31 DIAGNOSIS — I10 Essential (primary) hypertension: Secondary | ICD-10-CM

## 2017-12-31 LAB — COMPLETE METABOLIC PANEL WITH GFR
AG RATIO: 1.3 (calc) (ref 1.0–2.5)
ALT: 19 U/L (ref 9–46)
AST: 19 U/L (ref 10–35)
Albumin: 3.7 g/dL (ref 3.6–5.1)
Alkaline phosphatase (APISO): 97 U/L (ref 40–115)
BUN/Creatinine Ratio: 20 (calc) (ref 6–22)
BUN: 40 mg/dL — ABNORMAL HIGH (ref 7–25)
CO2: 28 mmol/L (ref 20–32)
Calcium: 9 mg/dL (ref 8.6–10.3)
Chloride: 103 mmol/L (ref 98–110)
Creat: 2 mg/dL — ABNORMAL HIGH (ref 0.70–1.11)
GFR, Est African American: 34 mL/min/{1.73_m2} — ABNORMAL LOW (ref >=60)
GFR, Est Non African American: 29 mL/min/{1.73_m2} — ABNORMAL LOW (ref >=60)
GLUCOSE: 84 mg/dL (ref 65–139)
Globulin: 2.8 g/dL (calc) (ref 1.9–3.7)
POTASSIUM: 5.1 mmol/L (ref 3.5–5.3)
Sodium: 140 mmol/L (ref 135–146)
Total Bilirubin: 0.5 mg/dL (ref 0.2–1.2)
Total Protein: 6.5 g/dL (ref 6.1–8.1)

## 2017-12-31 LAB — CBC WITH DIFFERENTIAL/PLATELET
Basophils Absolute: 41 cells/uL (ref 0–200)
Basophils Relative: 0.6 %
EOS PCT: 2.2 %
Eosinophils Absolute: 152 cells/uL (ref 15–500)
HCT: 40.2 % (ref 38.5–50.0)
Hemoglobin: 13.9 g/dL (ref 13.2–17.1)
Lymphs Abs: 2305 cells/uL (ref 850–3900)
MCH: 32.9 pg (ref 27.0–33.0)
MCHC: 34.6 g/dL (ref 32.0–36.0)
MCV: 95 fL (ref 80.0–100.0)
MONOS PCT: 6.8 %
MPV: 11.3 fL (ref 7.5–12.5)
NEUTROS PCT: 57 %
Neutro Abs: 3933 cells/uL (ref 1500–7800)
PLATELETS: 163 10*3/uL (ref 140–400)
RBC: 4.23 10*6/uL (ref 4.20–5.80)
RDW: 13.6 % (ref 11.0–15.0)
TOTAL LYMPHOCYTE: 33.4 %
WBC mixed population: 469 cells/uL (ref 200–950)
WBC: 6.9 10*3/uL (ref 3.8–10.8)

## 2017-12-31 NOTE — Telephone Encounter (Signed)
Called and spoke to Mrs. Brame and Dawn with [employer] and asked her to please reach out to her today and get process started.

## 2017-12-31 NOTE — Progress Notes (Signed)
Careteam: Patient Care Team: [name], NP as PCP - General (Geriatric Medicine)  Advanced Directive information    Allergies  Allergen Reactions  . Aleve [Naproxen Sodium]     Upset stomach   . Nsaids     Bother his stomach  . Antihistamines, Diphenhydramine-Type     Causes urinary issues    Chief Complaint  Patient presents with  . Medical Management of Chronic Issues    Pt is being seen for a 6 month routine visit.   . Other    Wife in room     HPI: Patient is a 84 y.o. male seen in the office today for routine follow up. Pt with hx of stage 3 right heel ulcer, tremor, abnormal gait, HTN, dysphagia with recurrent aspiration pneumonia.  Followed with neurologist since last OV, recommended braces for feet to help with foot drop and has PT coming to help with strength.  Stage 3 right heel ulcer has basically healed- small callus over area. Wife is monitoring closely  Hx of aspiration, ongoing cough, sometimes worse than others. Can not sleep on back due to cough  Macular degeneration- following with ophthalmologist, recommended injection which he just completed and started on vitamin, feels like he has benefited from this.   BPH- no changes in urination. Swelling of breast tissue persist even being off flomax, does not bother him.   Tremors- progressively worsening, continues on primidone.   No recent gout flares  Review of Systems:  Review of Systems  Constitutional: Negative for chills and fever.  HENT: Negative for congestion, ear discharge, ear pain and sinus pain.   Eyes:       Glasses  Respiratory: Negative for cough, sputum production and shortness of breath.   Cardiovascular: Negative for chest pain, palpitations and leg swelling.  Gastrointestinal: Negative for abdominal pain, constipation, diarrhea, heartburn, nausea and vomiting.  Genitourinary: Negative for dysuria, frequency and hematuria.  Musculoskeletal: Negative for falls and myalgias.    Skin: Negative for itching and rash.  Neurological: Positive for tremors, sensory change and weakness. Negative for dizziness, tingling, speech change, focal weakness, loss of consciousness and headaches.  Psychiatric/Behavioral: Negative for depression, hallucinations and memory loss. The patient is not nervous/anxious and does not have insomnia.     Past Medical History:  Diagnosis Date  . Abnormality of gait   . Benign neoplasm of colon   . Chronic kidney disease, stage III (moderate) (HCC)   . Complication of anesthesia    postop headache after general anesthesia  . Degenerative arthritis   . Diverticulosis of colon (without mention of hemorrhage)   . Duodenal ulcer, unspecified as acute or chronic, without hemorrhage, perforation, or obstruction   . Encounter for long-term (current) use of other medications   . Enlarged prostate    takes Tamsulosin daily  . Essential and other specified forms of tremor   . Foley catheter in place 09-01-13   4 months now  . Foot drop, bilateral   . Gait disorder    balance issues-uses walker or mobile chair.  . Gout    takes Allopurinol daily  . Gout, unspecified   . Hyperlipidemia    but doesn't require meds  . Hypertension    takes Enalapril daily   . Inguinal hernia without mention of obstruction or gangrene, unilateral or unspecified, (not specified as recurrent)   . Internal hemorrhoids without mention of complication   . Macular degeneration    takes eye cap vits daily  .  Other abnormal blood chemistry   . Peripheral neuropathy   . Peripheral neuropathy   . Pneumonia    hx of;last time 8'14-mild.  . Ptosis    Left side  . Spinal stenosis, lumbar region, with neurogenic claudication   . TIA (transient ischemic attack)    sees Dr.Willis-neurologist 6'14(strange feeling of head)-no residual  . Unspecified vitamin D deficiency   . Urinary frequency   . Urinary urgency   . UTI (lower urinary tract infection) 09-01-13   tx. 2  weeks ago   Past Surgical History:  Procedure Laterality Date  . CATARACT EXTRACTION Right 08/31/2014   Dr.Lyles  . colonosocpy    . FINGER SURGERY Left    Left ring finger  . HERNIA REPAIR     at least 15yrs ago  . HIP PINNING,CANNULATED Left 05/05/2013   Procedure: CANNULATED HIP PINNING- left;  Surgeon: Renette Butters, MD;  Location: [facility];  Service: Orthopedics;  Laterality: Left;  . INGUINAL HERNIA REPAIR  09/05/2012   Procedure: LAPAROSCOPIC INGUINAL HERNIA;  Surgeon: Gayland Curry, MD,FACS;  Location: [facility];  Service: General;  Laterality: Left;  . INSERTION OF MESH  09/05/2012   Procedure: INSERTION OF MESH;  Surgeon: Gayland Curry, MD,FACS;  Location: [facility];  Service: General;  Laterality: Left;  . left leg surgery  2011  . NERVE BIOPSY    . TRANSURETHRAL RESECTION OF PROSTATE N/A 09/07/2013   Procedure: CYSTO TRANSURETHRAL RESECTION OF THE PROSTATE (TURP);  Surgeon: Reece Packer, MD;  Location: [facility];  Service: Urology;  Laterality: N/A;   Social History:   reports that he quit smoking about 32 years ago. His smoking use included cigarettes. He quit smokeless tobacco use about 16 years ago. He reports that he does not drink alcohol or use drugs.  Family History  Problem Relation Age of Onset  . Diabetes Mother        type 2  . Clotting disorder Mother        deceased, blood clot  . Leukemia Father   . COPD Brother   . Lung cancer Sister   . Heart disease Brother        1/2 brother    Medications: Patient's Medications  New Prescriptions   No medications on file  Previous Medications   ACETAMINOPHEN (TYLENOL) 500 MG TABLET    Take 500-1,000 mg by mouth every 6 (six) hours as needed.   ALLOPURINOL (ZYLOPRIM) 100 MG TABLET    TAKE 1 TABLET BY MOUTH TWICE DAILY TO PREVENT GOUT.   B COMPLEX VITAMINS CAPSULE    Take 1 capsule by mouth daily.    CHOLECALCIFEROL (VITAMIN D3) 2000 UNITS TABS    Take by mouth daily.   CLOPIDOGREL (PLAVIX) 75 MG TABLET    TAKE 1  TABLET BY MOUTH ONCE DAILY FOR ANTICOAGULATION   FINASTERIDE (PROSCAR) 5 MG TABLET    TAKE 1 TABLET BY MOUTH ONCE DAILY TO  HELP  SHRINK  PROSTATE   OVER THE COUNTER MEDICATION    Take 1 capsule by mouth daily. VISION ALIGN   PRIMIDONE (MYSOLINE) 50 MG TABLET    TAKE 1 TABLET BY MOUTH ONCE DAILY IN THE EVENING TO HELP WITH TREMORS.  Modified Medications   No medications on file  Discontinued Medications   No medications on file     Physical Exam:  Vitals:   12/31/17 1135  BP: (!) 148/84  Pulse: 74  Temp: 98.4 F (36.9 C)  TempSrc: Oral  SpO2: 96%  Weight: 169 lb (76.7 kg)  Height: 5\' 8"  (1.727 m)   Body mass index is 25.7 kg/m.  Physical Exam  Constitutional: He is oriented to person, place, and time. He appears well-developed and well-nourished.  Thin, frail   HENT:  Head: Normocephalic and atraumatic.  Right Ear: External ear normal.  Nose: Nose normal.  Mouth/Throat: Oropharynx is clear and moist.  Eyes: Pupils are equal, round, and reactive to light. Left eye exhibits no chemosis and no exudate. No foreign body present in the left eye.  Cardiovascular: Normal rate, regular rhythm and normal heart sounds.  Pulmonary/Chest: Effort normal and breath sounds normal.  Abdominal: Soft. Bowel sounds are normal.  Genitourinary: Rectum normal. Rectal exam shows guaiac negative stool.  Musculoskeletal: He exhibits no edema or tenderness.  Unstable gait. Using walker.Muscular wasting of hand muscles and forearm muscles and lower leg muscles. Contractures of all fingers in both hands.  Neurological: He is alert and oriented to person, place, and time. No cranial nerve deficit. Coordination abnormal.  Bilateral foot drop. Tremor. Generalized weakness based on neuromuscular disease.  Skin: Skin is warm and dry.  Psychiatric: He has a normal mood and affect.    Labs reviewed: Basic Metabolic Panel: Recent Labs    06/12/17 1448  NA 140  K 4.3  CL 105  CO2 25  GLUCOSE 122   BUN 42*  CREATININE 1.92*  CALCIUM 9.1   Liver Function Tests: Recent Labs    06/12/17 1448  AST 15  ALT 12  BILITOT 0.3  PROT 6.3   No results for input(s): LIPASE, AMYLASE in the last 8760 hours. No results for input(s): AMMONIA in the last 8760 hours. CBC: Recent Labs    06/12/17 1448  WBC 7.2  NEUTROABS 3,629  HGB 13.2  HCT 39.1  MCV 95.8  PLT 161   Lipid Panel: No results for input(s): CHOL, HDL, LDLCALC, TRIG, CHOLHDL, LDLDIRECT in the last 8760 hours. TSH: No results for input(s): TSH in the last 8760 hours. A1C: No results found for: HGBA1C   Assessment/Plan 1. Essential hypertension -stable, not currently on medication   2. Benign essential tremor -stable on primidone   3. Polyneuropathy in other diseases classified elsewhere ([facility]) Stable, without acute changes, denies pain at this time, very limited ROM to hands and feet due to contractures.   4. Benign prostatic hyperplasia without lower urinary tract symptoms -stable, without current symptoms on proscar  5. CKD (chronic kidney disease), stage III ([facility]) -will follow up BMP at this time.   6. Abnormality of gait Due to progressive neuromuscular disease, wearing brace to prevent foot drop at this time and starting PT  7. Chronic gout without tophus, unspecified cause, unspecified site Stable, no recent flare, last uric acid level 6.5 in September 2018  Next appt: follow up in 6 months with Dr Sharee Holster K. Shepherd, [facility] [phone]

## 2018-01-01 ENCOUNTER — Ambulatory Visit: Payer: Self-pay | Admitting: Nurse Practitioner

## 2018-01-01 ENCOUNTER — Other Ambulatory Visit: Payer: Self-pay | Admitting: Nurse Practitioner

## 2018-01-01 DIAGNOSIS — G629 Polyneuropathy, unspecified: Secondary | ICD-10-CM | POA: Diagnosis not present

## 2018-01-01 DIAGNOSIS — M6281 Muscle weakness (generalized): Secondary | ICD-10-CM | POA: Diagnosis not present

## 2018-01-01 DIAGNOSIS — I10 Essential (primary) hypertension: Secondary | ICD-10-CM | POA: Diagnosis not present

## 2018-01-01 DIAGNOSIS — I679 Cerebrovascular disease, unspecified: Secondary | ICD-10-CM

## 2018-01-01 DIAGNOSIS — R2689 Other abnormalities of gait and mobility: Secondary | ICD-10-CM | POA: Diagnosis not present

## 2018-01-02 ENCOUNTER — Other Ambulatory Visit: Payer: Self-pay

## 2018-01-02 DIAGNOSIS — N184 Chronic kidney disease, stage 4 (severe): Secondary | ICD-10-CM

## 2018-01-08 DIAGNOSIS — B351 Tinea unguium: Secondary | ICD-10-CM | POA: Diagnosis not present

## 2018-01-08 DIAGNOSIS — M79671 Pain in right foot: Secondary | ICD-10-CM | POA: Diagnosis not present

## 2018-01-08 DIAGNOSIS — M79672 Pain in left foot: Secondary | ICD-10-CM | POA: Diagnosis not present

## 2018-01-08 DIAGNOSIS — L84 Corns and callosities: Secondary | ICD-10-CM | POA: Diagnosis not present

## 2018-01-24 ENCOUNTER — Encounter

## 2018-01-24 ENCOUNTER — Ambulatory Visit: Payer: Medicare Other | Admitting: Neurology

## 2018-02-20 DIAGNOSIS — H353122 Nonexudative age-related macular degeneration, left eye, intermediate dry stage: Secondary | ICD-10-CM | POA: Diagnosis not present

## 2018-02-20 DIAGNOSIS — H353211 Exudative age-related macular degeneration, right eye, with active choroidal neovascularization: Secondary | ICD-10-CM | POA: Diagnosis not present

## 2018-02-20 DIAGNOSIS — H43813 Vitreous degeneration, bilateral: Secondary | ICD-10-CM | POA: Diagnosis not present

## 2018-02-22 ENCOUNTER — Other Ambulatory Visit: Payer: Self-pay | Admitting: Nurse Practitioner

## 2018-02-26 ENCOUNTER — Ambulatory Visit (INDEPENDENT_AMBULATORY_CARE_PROVIDER_SITE_OTHER): Payer: Medicare Other | Admitting: Internal Medicine

## 2018-02-26 ENCOUNTER — Telehealth: Payer: Self-pay

## 2018-02-26 ENCOUNTER — Encounter: Payer: Self-pay | Admitting: Internal Medicine

## 2018-02-26 ENCOUNTER — Ambulatory Visit
Admission: RE | Admit: 2018-02-26 | Discharge: 2018-02-26 | Disposition: A | Discharge status: Home / Self Care | Payer: Medicare Other | Source: Ambulatory Visit | Attending: Internal Medicine | Admitting: Internal Medicine

## 2018-02-26 VITALS — BP 140/82 | HR 81 | Temp 97.6°F | Ht 68.0 in | Wt 169.0 lb

## 2018-02-26 DIAGNOSIS — R600 Localized edema: Secondary | ICD-10-CM

## 2018-02-26 DIAGNOSIS — R059 Cough, unspecified: Secondary | ICD-10-CM

## 2018-02-26 DIAGNOSIS — L905 Scar conditions and fibrosis of skin: Secondary | ICD-10-CM | POA: Diagnosis not present

## 2018-02-26 DIAGNOSIS — S0101XA Laceration without foreign body of scalp, initial encounter: Secondary | ICD-10-CM

## 2018-02-26 DIAGNOSIS — I739 Peripheral vascular disease, unspecified: Secondary | ICD-10-CM

## 2018-02-26 DIAGNOSIS — Z85828 Personal history of other malignant neoplasm of skin: Secondary | ICD-10-CM | POA: Diagnosis not present

## 2018-02-26 DIAGNOSIS — L570 Actinic keratosis: Secondary | ICD-10-CM | POA: Diagnosis not present

## 2018-02-26 DIAGNOSIS — T148XXA Other injury of unspecified body region, initial encounter: Secondary | ICD-10-CM

## 2018-02-26 DIAGNOSIS — R05 Cough: Secondary | ICD-10-CM

## 2018-02-26 DIAGNOSIS — S90415A Abrasion, left lesser toe(s), initial encounter: Secondary | ICD-10-CM | POA: Diagnosis not present

## 2018-02-26 DIAGNOSIS — N184 Chronic kidney disease, stage 4 (severe): Secondary | ICD-10-CM

## 2018-02-26 DIAGNOSIS — J189 Pneumonia, unspecified organism: Secondary | ICD-10-CM

## 2018-02-26 DIAGNOSIS — Z85820 Personal history of malignant melanoma of skin: Secondary | ICD-10-CM | POA: Diagnosis not present

## 2018-02-26 LAB — COMPLETE METABOLIC PANEL WITH GFR
AG RATIO: 1.3 (calc) (ref 1.0–2.5)
ALKALINE PHOSPHATASE (APISO): 108 U/L (ref 40–115)
ALT: 30 U/L (ref 9–46)
AST: 24 U/L (ref 10–35)
Albumin: 3.6 g/dL (ref 3.6–5.1)
BILIRUBIN TOTAL: 0.5 mg/dL (ref 0.2–1.2)
BUN / CREAT RATIO: 19 (calc) (ref 6–22)
BUN: 41 mg/dL — ABNORMAL HIGH (ref 7–25)
CO2: 28 mmol/L (ref 20–32)
Calcium: 9 mg/dL (ref 8.6–10.3)
Chloride: 105 mmol/L (ref 98–110)
Creat: 2.18 mg/dL — ABNORMAL HIGH (ref 0.70–1.11)
GFR, Est African American: 31 mL/min/{1.73_m2} — ABNORMAL LOW (ref >=60)
GFR, Est Non African American: 26 mL/min/{1.73_m2} — ABNORMAL LOW (ref >=60)
Globulin: 2.8 g/dL (calc) (ref 1.9–3.7)
Glucose, Bld: 96 mg/dL (ref 65–99)
POTASSIUM: 4.9 mmol/L (ref 3.5–5.3)
Sodium: 139 mmol/L (ref 135–146)
Total Protein: 6.4 g/dL (ref 6.1–8.1)

## 2018-02-26 MED ORDER — FUROSEMIDE 20 MG PO TABS: 20.0000 mg | ORAL_TABLET | Freq: Every day | ORAL | 0 refills | Status: DC

## 2018-02-26 MED ORDER — LEVOFLOXACIN 250 MG PO TABS: 250.0000 mg | ORAL_TABLET | Freq: Every day | ORAL | 0 refills | Status: DC

## 2018-02-26 NOTE — Progress Notes (Signed)
Patient ID: Drew Gibson, male   DOB: 1929-05-24, 84 y.o.   MRN: [id number]   [facility] OFFICE  Provider: DR Arletha Grippe  Code Status:  Goals of Care:  Advanced Directives 07/03/2017  Does Patient Have a Medical Advance Directive? Yes  Type of Advance Directive Living will  Does patient want to make changes to medical advance directive? -  Copy of [name] in Chart? -  Pre-existing out of facility DNR order (yellow form or pink MOST form) -     Chief Complaint  Patient presents with  . Acute Visit    Bilateral foot swelling, worse in right foot. Patient also with long-term cough, worse x 2 weeks. Cough is prodictive. Patient had fall this am, no injury (called Fire Department to assist patient in getting up), + fall risk     HPI: Patient is a 84 y.o. male seen today for an acute visit for BLE swelling x 1 week that is non-dependent. No relief with elevation. No weight gain. He also has a mostly nonproductive cough. No CP, SOB, N/V, f/c. No HA or dizziness. No syncopal episode. (+) head trauma but no LOC. Fall unwitnessed. Wife present. Pt has hx stage 4 CKD  He fell this AM while in the bathroom and hit right knee and sustained several abrasions including left great toe. ABIs in June 2018 revealed moderate PAD.   Past Medical History:  Diagnosis Date  . Abnormality of gait   . Benign neoplasm of colon   . Chronic kidney disease, stage III (moderate) (HCC)   . Complication of anesthesia    postop headache after general anesthesia  . Degenerative arthritis   . Diverticulosis of colon (without mention of hemorrhage)   . Duodenal ulcer, unspecified as acute or chronic, without hemorrhage, perforation, or obstruction   . Encounter for long-term (current) use of other medications   . Enlarged prostate    takes Tamsulosin daily  . Essential and other specified forms of tremor   . Foley catheter in place 09-01-13   4 months now  . Foot drop, bilateral   . Gait  disorder    balance issues-uses walker or mobile chair.  . Gout    takes Allopurinol daily  . Gout, unspecified   . Hyperlipidemia    but doesn't require meds  . Hypertension    takes Enalapril daily   . Inguinal hernia without mention of obstruction or gangrene, unilateral or unspecified, (not specified as recurrent)   . Internal hemorrhoids without mention of complication   . Macular degeneration    takes eye cap vits daily  . Other abnormal blood chemistry   . Peripheral neuropathy   . Peripheral neuropathy   . Pneumonia    hx of;last time 8'14-mild.  . Ptosis    Left side  . Spinal stenosis, lumbar region, with neurogenic claudication   . TIA (transient ischemic attack)    sees Dr.Willis-neurologist 6'14(strange feeling of head)-no residual  . Unspecified vitamin D deficiency   . Urinary frequency   . Urinary urgency   . UTI (lower urinary tract infection) 09-01-13   tx. 2 weeks ago    Past Surgical History:  Procedure Laterality Date  . CATARACT EXTRACTION Right 08/31/2014   Dr.Lyles  . colonosocpy    . FINGER SURGERY Left    Left ring finger  . HERNIA REPAIR     at least 93yr ago  . HIP PINNING,CANNULATED Left 05/05/2013   Procedure: CANNULATED HIP  PINNING- left;  Surgeon: Renette Butters, MD;  Location: [place];  Service: Orthopedics;  Laterality: Left;  . INGUINAL HERNIA REPAIR  09/05/2012   Procedure: LAPAROSCOPIC INGUINAL HERNIA;  Surgeon: Gayland Curry, MD,FACS;  Location: [place];  Service: General;  Laterality: Left;  . INSERTION OF MESH  09/05/2012   Procedure: INSERTION OF MESH;  Surgeon: Gayland Curry, MD,FACS;  Location: [place];  Service: General;  Laterality: Left;  . left leg surgery  2011  . NERVE BIOPSY    . TRANSURETHRAL RESECTION OF PROSTATE N/A 09/07/2013   Procedure: CYSTO TRANSURETHRAL RESECTION OF THE PROSTATE (TURP);  Surgeon: Reece Packer, MD;  Location: [facility];  Service: Urology;  Laterality: N/A;     reports that he quit smoking about 32  years ago. His smoking use included cigarettes. He quit smokeless tobacco use about 17 years ago. He reports that he does not drink alcohol or use drugs. Social History   Socioeconomic History  . Marital status: Married    Spouse name: Not on file  . Number of children: 2  . Years of education: 77  . Highest education level: Not on file  Occupational History  . 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 Financial resource strain: Not on file  . Food insecurity:    Worry: Not on file    Inability: Not on file  . Transportation needs:    Medical: Not on file    Non-medical: Not on file  Tobacco Use  . Smoking status: Former Smoker    Types: Cigarettes    Last attempt to quit: 05/10/1985    Years since quitting: 32.8  . Smokeless tobacco: Former Systems developer    Quit date: 02/07/2001  Substance and Sexual Activity  . Alcohol use: No    Alcohol/week: 0.0 oz  . Drug use: No  . Sexual activity: Not Currently  Lifestyle  . Physical activity:    Days per week: Not on file    Minutes per session: Not on file  . Stress: Not on file  Relationships  . Social connections:    Talks on phone: Not on file    Gets together: Not on file    Attends religious service: Not on file    Active member of club or organization: Not on file    Attends meetings of clubs or organizations: Not on file    Relationship status: Not on file  . Intimate partner violence:    Fear of current or ex partner: Not on file    Emotionally abused: Not on file    Physically abused: Not on file    Forced sexual activity: Not on file  Other Topics Concern  . Not on file  Social History Narrative   Lives with wife Drew Gibson   Former smoker stopped 1986   Alcohol none   Exercise none    Family History  Problem Relation Age of Onset  . Diabetes Mother        type 2  . Clotting disorder Mother        deceased, blood clot  . Leukemia Father   . COPD Brother   . Lung cancer Sister   . Heart disease  Brother        1/2 brother    Allergies  Allergen Reactions  . Aleve [Naproxen Sodium]     Upset stomach   . Nsaids     Bother his stomach  . Antihistamines, Diphenhydramine-Type  Causes urinary issues    Outpatient Encounter Medications as of 02/26/2018  Medication Sig  . acetaminophen (TYLENOL) 500 MG tablet Take 500-1,000 mg by mouth every 6 (six) hours as needed.  Marland Kitchen allopurinol (ZYLOPRIM) 100 MG tablet TAKE 1 TABLET BY MOUTH TWICE DAILY TO PREVENT GOUT.  Marland Kitchen b complex vitamins capsule Take 1 capsule by mouth daily.   . Cholecalciferol (VITAMIN D3) 2000 UNITS TABS Take by mouth daily.  . clopidogrel (PLAVIX) 75 MG tablet TAKE 1 TABLET BY MOUTH ONCE DAILY FOR  ANTICOAGULATION  . finasteride (PROSCAR) 5 MG tablet TAKE 1 TABLET BY MOUTH ONCE DAILY TO  HELP  SHRINK  PROSTATE  . OVER THE COUNTER MEDICATION Take 1 capsule by mouth daily. VISION ALIGN  . primidone (MYSOLINE) 50 MG tablet TAKE 1 TABLET BY MOUTH ONCE DAILY IN THE EVENING TO HELP WITH TREMORS.   No facility-administered encounter medications on file as of 02/26/2018.     Review of Systems:  Review of Systems  Respiratory: Positive for cough.   Cardiovascular: Positive for leg swelling.  Musculoskeletal: Positive for gait problem.  Skin: Positive for wound.  All other systems reviewed and are negative.   Health Maintenance  Topic Date Due  . PNA vac Low Risk Adult (2 of 2 - PPSV23) 12/05/2017  . INFLUENZA VACCINE  05/01/2018  . TETANUS/TDAP  08/25/2026    Physical Exam: Vitals:   02/26/18 1120  BP: 140/82  Pulse: 81  Temp: 97.6 F (36.4 C)  TempSrc: Oral  SpO2: 95%  Weight: 169 lb (76.7 kg)  Height: _0  (1.727 m)   Body mass index is 25.7 kg/m. Physical Exam  Constitutional: He is oriented to person, place, and time. He appears well-developed.  Frail appearing in NAD, sitting in w/c  Cardiovascular: Normal rate and regular rhythm.  Murmur heard.  Systolic murmur is present with a grade of  1/6. Pulses:      Dorsalis pedis pulses are 1+ on the right side, and 1+ on the left side.       Posterior tibial pulses are 1+ on the right side, and 1+ on the left side.  +1 pitting LE edema b/l. No calf TP  Pulmonary/Chest: No stridor. No respiratory distress. He has no wheezes. He has no rales. He exhibits no tenderness.  Abdominal: Soft. Bowel sounds are normal. He exhibits no mass. There is no tenderness. There is no rebound and no guarding.  Musculoskeletal: He exhibits edema (small and large joints).  Neurological: He is alert and oriented to person, place, and time.  Skin: Skin is warm and dry. There is erythema.     Left 1st toe with abrasion and redness but no d/c; left parietal scalp small laceration, not bleeding, NT; right anterior knee hematoma, grape sized, TTP  Psychiatric: He has a normal mood and affect. His behavior is normal. Thought content normal.    Labs reviewed: Basic Metabolic Panel: Recent Labs    06/12/17 1448 12/31/17 1219  NA 140 140  K 4.3 5.1  CL 105 103  CO2 25 28  GLUCOSE 122 84  BUN 42* 40*  CREATININE 1.92* 2.00*  CALCIUM 9.1 9.0   Liver Function Tests: Recent Labs    06/12/17 1448 12/31/17 1219  AST 15 19  ALT 12 19  BILITOT 0.3 0.5  PROT 6.3 6.5   No results for input(s): LIPASE, AMYLASE in the last 8760 hours. No results for input(s): AMMONIA in the last 8760 hours. CBC: Recent Labs  06/12/17 1448 12/31/17 1219  WBC 7.2 6.9  NEUTROABS 3,629 3,933  HGB 13.2 13.9  HCT 39.1 40.2  MCV 95.8 95.0  PLT 161 163   Lipid Panel: No results for input(s): CHOL, HDL, LDLCALC, TRIG, CHOLHDL, LDLDIRECT in the last 8760 hours. No results found for: HGBA1C  Procedures since last visit: No results found.  Assessment/Plan   ICD-10-CM   1. Cough R05 DG Chest 2 View    CMP with eGFR([facility])  2. Bilateral lower extremity edema R60.0 DG Chest 2 View    CMP with eGFR([facility])    CANCELED: CMP with eGFR([facility])  3. Hematoma and  contusion T14.8XXA    distal anterior right knee  4. Laceration of scalp, initial encounter S01.01XA   5. Toe abrasion, left, initial encounter S90.415A    no signs of secondary infection  6. CKD (chronic kidney disease) stage 4, GFR 15-29 ml/min (HCC) N18.4   7. PAD (peripheral artery disease) (HCC) I73.9    moderate BLE by recent ABIs   Cool compress to right knee and scalp to reduce swelling. May alternate warm compress on knee  Keep legs elevated  Will call with chest xray results and lab results - you may need fluid pill  Local wound care to left big toe and scrapes - may apply topical antibiotic  Follow up with specialists as scheduled  Follow up with Drew Gibson as scheduled or sooner if need be.    Drew Ralphs S. Perlie Gold  [facility] [address] [place], [place] [address] [phone] Cell (Monday-Friday 8 AM - 5 PM) [phone] After 5 PM and follow prompts

## 2018-02-26 NOTE — Telephone Encounter (Signed)
-----   Message from [place], [place] sent at 02/26/2018  3:51 PM EDT ----- Drew Gibson reveals possible pneumonia - Rx Levaquin 250mg  (dosed for stage 4 kidney disease) #8  Take 2 tabs po on day 1 then 1 tab po daily x 6 days; take probiotic daily while on antibiotic; repeat chest xray in 2 weeks for pneumonia

## 2018-02-26 NOTE — Telephone Encounter (Signed)
Imaging call report received from [facility] for Xray. Per Marzetta Board, first impression showed small right, greater than left plural fusion. Results now in [place]. Routed to Dr. Eulas Post.

## 2018-02-26 NOTE — Telephone Encounter (Signed)
Refer to full xray report under imaging tab

## 2018-02-26 NOTE — Telephone Encounter (Signed)
Notes recorded by Gildardo Cranker, DO on 02/26/2018 at 3:47 PM EDT Kidney function is slightly worse - start lasix 20mg  #7 take 1 tab po daily x 3 days and then daily prn swelling. Eat banana or drink glass of OJ daily for potassium supplement; repeat BMP in 1 week for CKD  Rx's sent to pharmacy. Chest Xray ordered. Disussed Xray and lab results with patient's wife. Scheduled 1 week BMP. YW

## 2018-02-26 NOTE — Patient Instructions (Addendum)
Cool compress to right knee and scalp to reduce swelling. May alternate warm compress on knee  Keep legs elevated  Will call with chest xray results and lab results - you may need fluid pill  Local wound care to left big toe and scrapes - may apply topical antibiotic  Follow up with specialists as scheduled  Follow up with Drew Gibson as scheduled or sooner if need be.   Edema Edema is when you have too much fluid in your body or under your skin. Edema may make your legs, feet, and ankles swell up. Swelling is also common in looser tissues, like around your eyes. This is a common condition. It gets more common as you get older. There are many possible causes of edema. Eating too much salt (sodium) and being on your feet or sitting for a long time can cause edema in your legs, feet, and ankles. Hot weather may make edema worse. Edema is usually painless. Your skin may look swollen or shiny. Follow these instructions at home:  Keep the swollen body part raised (elevated) above the level of your heart when you are sitting or lying down.  Do not sit still or stand for a long time.  Do not wear tight clothes. Do not wear garters on your upper legs.  Exercise your legs. This can help the swelling go down.  Wear elastic bandages or support stockings as told by your doctor.  Eat a low-salt (low-sodium) diet to reduce fluid as told by your doctor.  Depending on the cause of your swelling, you may need to limit how much fluid you drink (fluid restriction).  Take over-the-counter and prescription medicines only as told by your doctor. Contact a doctor if:  Treatment is not working.  You have heart, liver, or kidney disease and have symptoms of edema.  You have sudden and unexplained weight gain. Get help right away if:  You have shortness of breath or chest pain.  You cannot breathe when you lie down.  You have pain, redness, or warmth in the swollen areas.  You have heart, liver, or  kidney disease and get edema all of a sudden.  You have a fever and your symptoms get worse all of a sudden. Summary  Edema is when you have too much fluid in your body or under your skin.  Edema may make your legs, feet, and ankles swell up. Swelling is also common in looser tissues, like around your eyes.  Raise (elevate) the swollen body part above the level of your heart when you are sitting or lying down.  Follow your doctor's instructions about diet and how much fluid you can drink (fluid restriction). This information is not intended to replace advice given to you by your health care provider. Make sure you discuss any questions you have with your health care provider. Document Released: 03/05/2008 Document Revised: 10/05/2016 Document Reviewed: 10/05/2016 Elsevier Interactive Patient Education  2017 [employer].

## 2018-02-27 ENCOUNTER — Ambulatory Visit: Payer: Medicare Other | Admitting: Nurse Practitioner

## 2018-02-28 ENCOUNTER — Telehealth: Payer: Self-pay | Admitting: *Deleted

## 2018-02-28 NOTE — Telephone Encounter (Signed)
Inez Catalina, wife called and stated that patient was seen on Wednesday and given Lasix. Stated that the Lasix is doing fine and there is less swelling today. Urinated about 1652ml yesterday.  Wife is wondering how much liquid should patient be drinking. She wants to know if he Should he increase his fluid intake. Please Advise.

## 2018-02-28 NOTE — Telephone Encounter (Signed)
Patient wife notified and agreed.  

## 2018-02-28 NOTE — Telephone Encounter (Signed)
Recommend fluid restrict 1800 cc per day due to swelling

## 2018-03-04 ENCOUNTER — Other Ambulatory Visit: Payer: Self-pay

## 2018-03-04 DIAGNOSIS — N184 Chronic kidney disease, stage 4 (severe): Secondary | ICD-10-CM

## 2018-03-04 NOTE — Addendum Note (Signed)
Addended by: Logan Bores on: 03/04/2018 10:42 AM   Modules accepted: Orders

## 2018-03-05 ENCOUNTER — Other Ambulatory Visit: Payer: Medicare Other

## 2018-03-05 ENCOUNTER — Encounter: Payer: Self-pay | Admitting: Nurse Practitioner

## 2018-03-05 ENCOUNTER — Ambulatory Visit (INDEPENDENT_AMBULATORY_CARE_PROVIDER_SITE_OTHER): Payer: Medicare Other | Admitting: Nurse Practitioner

## 2018-03-05 VITALS — BP 152/86 | HR 73 | Temp 98.3°F | Ht 68.0 in | Wt 163.0 lb

## 2018-03-05 DIAGNOSIS — J189 Pneumonia, unspecified organism: Secondary | ICD-10-CM | POA: Diagnosis not present

## 2018-03-05 DIAGNOSIS — R600 Localized edema: Secondary | ICD-10-CM

## 2018-03-05 DIAGNOSIS — L03032 Cellulitis of left toe: Secondary | ICD-10-CM | POA: Diagnosis not present

## 2018-03-05 DIAGNOSIS — N184 Chronic kidney disease, stage 4 (severe): Secondary | ICD-10-CM | POA: Diagnosis not present

## 2018-03-05 LAB — BASIC METABOLIC PANEL
BUN / CREAT RATIO: 23 (calc) — AB (ref 6–22)
BUN: 46 mg/dL — AB (ref 7–25)
CHLORIDE: 107 mmol/L (ref 98–110)
CO2: 28 mmol/L (ref 20–32)
CREATININE: 2 mg/dL — AB (ref 0.70–1.11)
Calcium: 9.1 mg/dL (ref 8.6–10.3)
Glucose, Bld: 83 mg/dL (ref 65–99)
POTASSIUM: 4.6 mmol/L (ref 3.5–5.3)
SODIUM: 141 mmol/L (ref 135–146)

## 2018-03-05 MED ORDER — DOXYCYCLINE HYCLATE 100 MG PO TABS: 100.0000 mg | ORAL_TABLET | Freq: Two times a day (BID) | ORAL | 0 refills | Status: DC

## 2018-03-05 NOTE — Patient Instructions (Addendum)
Soak foot twice daily Doxycyline 100 mg by mouth twice daily for 10 days  To continue mupirocin twice daily   To notify if area is getting worse, redness, bigger, drainage changes, pain.   Follow up in 10 days or sooner if needed

## 2018-03-05 NOTE — Progress Notes (Signed)
Careteam: Patient Care Team: Drew Chandler, NP as PCP - General (Geriatric Medicine)  Advanced Directive information Does Patient Have a Medical Advance Directive?: Yes, Type of Advance Directive: [place];Living will  Allergies  Allergen Reactions  . Aleve [Naproxen Sodium]     Upset stomach   . Nsaids     Bother his stomach  . Antihistamines, Diphenhydramine-Type     Causes urinary issues    Chief Complaint  Patient presents with  . Acute Visit    Pt is being seen due to sore on left great toe 1 week ago. Pt scraped toe during a fall and it became inflammed. Pt wife has been using mupirocin ointment 2%.      HPI: Patient is a 84 y.o. male seen in the office today due to wound on toe. 1 week ago he feel and scraped his toe. He was seen on 02/16/18 but at that time it was not red or swollen but over the next few days got more inflamed and swollen therefore his wife started him on mupirocin ointment which has improved the swelling. He reports old toe injury that has left his toe looking larger.  The swelling better but redness persist but has improved. It was bright red. It has also been oozing, clear drainage.   Last week he was seen due to cough and bilateral LE edema.  Chest xray was obtained possible pneumonia noted and was placed on Levaquin. Was having more shortness of breath prior to Levaquin, but now has improved.   Swelling has improved   Review of Systems:  Review of Systems  Constitutional: Negative for chills and fever.  HENT: Negative for congestion, ear discharge, ear pain and sinus pain.   Eyes:       Glasses  Respiratory: Positive for cough. Negative for sputum production and shortness of breath.   Cardiovascular: Negative for chest pain, palpitations and leg swelling.  Gastrointestinal: Negative for abdominal pain, constipation, diarrhea, heartburn, nausea and vomiting.  Genitourinary: Negative for dysuria, frequency and  hematuria.  Musculoskeletal: Negative for falls and myalgias.  Skin: Negative for itching and rash.       Wound to left great toe with redness and clear drainage  Neurological: Positive for tremors, sensory change and weakness. Negative for dizziness, tingling, speech change, focal weakness, loss of consciousness and headaches.    Past Medical History:  Diagnosis Date  . Abnormality of gait   . Benign neoplasm of colon   . Chronic kidney disease, stage III (moderate) (HCC)   . Complication of anesthesia    postop headache after general anesthesia  . Degenerative arthritis   . Diverticulosis of colon (without mention of hemorrhage)   . Duodenal ulcer, unspecified as acute or chronic, without hemorrhage, perforation, or obstruction   . Encounter for long-term (current) use of other medications   . Enlarged prostate    takes Tamsulosin daily  . Essential and other specified forms of tremor   . Foley catheter in place 09-01-13   4 months now  . Foot drop, bilateral   . Gait disorder    balance issues-uses walker or mobile chair.  . Gout    takes Allopurinol daily  . Gout, unspecified   . Hyperlipidemia    but doesn't require meds  . Hypertension    takes Enalapril daily   . Inguinal hernia without mention of obstruction or gangrene, unilateral or unspecified, (not specified as recurrent)   . Internal hemorrhoids without  mention of complication   . Macular degeneration    takes eye cap vits daily  . Other abnormal blood chemistry   . Peripheral neuropathy   . Peripheral neuropathy   . Pneumonia    hx of;last time 8'14-mild.  . Ptosis    Left side  . Spinal stenosis, lumbar region, with neurogenic claudication   . TIA (transient ischemic attack)    sees Dr.Willis-neurologist 6'14(strange feeling of head)-no residual  . Unspecified vitamin D deficiency   . Urinary frequency   . Urinary urgency   . UTI (lower urinary tract infection) 09-01-13   tx. 2 weeks ago   Past  Surgical History:  Procedure Laterality Date  . CATARACT EXTRACTION Right 08/31/2014   Dr.Lyles  . colonosocpy    . FINGER SURGERY Left    Left ring finger  . HERNIA REPAIR     at least 4yrs ago  . HIP PINNING,CANNULATED Left 05/05/2013   Procedure: CANNULATED HIP PINNING- left;  Surgeon: Renette Butters, MD;  Location: [facility];  Service: Orthopedics;  Laterality: Left;  . INGUINAL HERNIA REPAIR  09/05/2012   Procedure: LAPAROSCOPIC INGUINAL HERNIA;  Surgeon: Gayland Curry, MD,FACS;  Location: [facility];  Service: General;  Laterality: Left;  . INSERTION OF MESH  09/05/2012   Procedure: INSERTION OF MESH;  Surgeon: Gayland Curry, MD,FACS;  Location: [facility];  Service: General;  Laterality: Left;  . left leg surgery  2011  . NERVE BIOPSY    . TRANSURETHRAL RESECTION OF PROSTATE N/A 09/07/2013   Procedure: CYSTO TRANSURETHRAL RESECTION OF THE PROSTATE (TURP);  Surgeon: Reece Packer, MD;  Location: [facility];  Service: Urology;  Laterality: N/A;   Social History:   reports that he quit smoking about 32 years ago. His smoking use included cigarettes. He quit smokeless tobacco use about 17 years ago. He reports that he does not drink alcohol or use drugs.  Family History  Problem Relation Age of Onset  . Diabetes Mother        type 2  . Clotting disorder Mother        deceased, blood clot  . Leukemia Father   . COPD Brother   . Lung cancer Sister   . Heart disease Brother        1/2 brother    Medications: Patient's Medications  New Prescriptions   No medications on file  Previous Medications   ACETAMINOPHEN (TYLENOL) 500 MG TABLET    Take 500-1,000 mg by mouth every 6 (six) hours as needed.   ALLOPURINOL (ZYLOPRIM) 100 MG TABLET    TAKE 1 TABLET BY MOUTH TWICE DAILY TO PREVENT GOUT.   B COMPLEX VITAMINS CAPSULE    Take 1 capsule by mouth daily.    CHOLECALCIFEROL (VITAMIN D3) 2000 UNITS TABS    Take by mouth daily.   CLOPIDOGREL (PLAVIX) 75 MG TABLET    TAKE 1 TABLET BY MOUTH ONCE  DAILY FOR  ANTICOAGULATION   FINASTERIDE (PROSCAR) 5 MG TABLET    TAKE 1 TABLET BY MOUTH ONCE DAILY TO  HELP  SHRINK  PROSTATE   FUROSEMIDE (LASIX) 20 MG TABLET    Take 1 tablet (20 mg total) by mouth daily. daily x 3 days and then daily prn swelling   MULTIPLE VITAMINS-MINERALS (PRESERVISION AREDS) CAPS    Take 1 capsule by mouth daily.   OVER THE COUNTER MEDICATION    Take 1 capsule by mouth daily. VISION ALIGN   PRIMIDONE (MYSOLINE) 50 MG TABLET  TAKE 1 TABLET BY MOUTH ONCE DAILY IN THE EVENING TO HELP WITH TREMORS.  Modified Medications   No medications on file  Discontinued Medications   LEVOFLOXACIN (LEVAQUIN) 250 MG TABLET    Take 1 tablet (250 mg total) by mouth daily. Take 2 tabs po on day 1 then 1 tab po daily x 6 days; take probiotic daily while on antibiotic     Physical Exam:  Vitals:   03/05/18 0940  BP: (!) 152/86  Pulse: 73  Temp: 98.3 F (36.8 C)  TempSrc: Oral  SpO2: 96%  Weight: 163 lb (73.9 kg)  Height: 5\' 8"  (1.727 m)   Body mass index is 24.78 kg/m.  Physical Exam  Constitutional: He is oriented to person, place, and time. He appears well-developed and well-nourished.  Thin, frail   HENT:  Head: Normocephalic and atraumatic.  Right Ear: External ear normal.  Nose: Nose normal.  Mouth/Throat: Oropharynx is clear and moist.  Eyes: Pupils are equal, round, and reactive to light. Left eye exhibits no chemosis and no exudate. No foreign body present in the left eye.  Cardiovascular: Normal rate, regular rhythm and normal heart sounds.  Pulmonary/Chest: Effort normal and breath sounds normal.  Abdominal: Soft. Bowel sounds are normal.  Genitourinary: Rectum normal. Rectal exam shows guaiac negative stool.  Musculoskeletal: He exhibits no edema or tenderness.  Unstable gait. Using walker.Muscular wasting of hand muscles and forearm muscles and lower leg muscles. Contractures of all fingers in both hands.  Neurological: He is alert and oriented to person,  place, and time. No cranial nerve deficit. Coordination abnormal.  Bilateral foot drop. Tremor. Generalized weakness based on neuromuscular disease.  Skin: Skin is warm and dry.  Top of left toe with 1.5 x 0.75 cm open abrasion, 50% red and 50% yellow. Clear drainage noted, redness to great toe without warmth  Psychiatric: He has a normal mood and affect.    Labs reviewed: Basic Metabolic Panel: Recent Labs    06/12/17 1448 12/31/17 1219 02/26/18 1215  NA 140 140 139  K 4.3 5.1 4.9  CL 105 103 105  CO2 25 28 28   GLUCOSE 122 84 96  BUN 42* 40* 41*  CREATININE 1.92* 2.00* 2.18*  CALCIUM 9.1 9.0 9.0   Liver Function Tests: Recent Labs    06/12/17 1448 12/31/17 1219 02/26/18 1215  AST 15 19 24   ALT 12 19 30   BILITOT 0.3 0.5 0.5  PROT 6.3 6.5 6.4   No results for input(s): LIPASE, AMYLASE in the last 8760 hours. No results for input(s): AMMONIA in the last 8760 hours. CBC: Recent Labs    06/12/17 1448 12/31/17 1219  WBC 7.2 6.9  NEUTROABS 3,629 3,933  HGB 13.2 13.9  HCT 39.1 40.2  MCV 95.8 95.0  PLT 161 163   Lipid Panel: No results for input(s): CHOL, HDL, LDLCALC, TRIG, CHOLHDL, LDLDIRECT in the last 8760 hours. TSH: No results for input(s): TSH in the last 8760 hours. A1C: No results found for: HGBA1C   Assessment/Plan 1. Cellulitis of toe of left foot - doxycycline (VIBRA-TABS) 100 MG tablet; Take 1 tablet (100 mg total) by mouth 2 (two) times daily.  Dispense: 20 tablet; Refill: 0 -cont mupirocin twice daily -foot soaks twice daily   2. Bilateral lower extremity edema Improved at this time  3. Pneumonia due to infectious organism, unspecified laterality, unspecified part of lung Improved after completion of Levaquin, also using mucinex DM by mouth twice daily   Strict return precautions given  Drew Gibson. [place], [facility] [phone]

## 2018-03-12 ENCOUNTER — Ambulatory Visit
Admission: RE | Admit: 2018-03-12 | Discharge: 2018-03-12 | Disposition: A | Discharge status: Home / Self Care | Payer: Medicare Other | Source: Ambulatory Visit | Attending: Internal Medicine | Admitting: Internal Medicine

## 2018-03-12 DIAGNOSIS — J189 Pneumonia, unspecified organism: Secondary | ICD-10-CM | POA: Diagnosis not present

## 2018-03-13 ENCOUNTER — Ambulatory Visit (INDEPENDENT_AMBULATORY_CARE_PROVIDER_SITE_OTHER): Payer: Medicare Other | Admitting: Nurse Practitioner

## 2018-03-13 ENCOUNTER — Encounter: Payer: Self-pay | Admitting: Nurse Practitioner

## 2018-03-13 VITALS — BP 160/98 | HR 73 | Temp 98.1°F | Ht 68.0 in | Wt 163.0 lb

## 2018-03-13 DIAGNOSIS — I10 Essential (primary) hypertension: Secondary | ICD-10-CM | POA: Diagnosis not present

## 2018-03-13 DIAGNOSIS — G4489 Other headache syndrome: Secondary | ICD-10-CM | POA: Diagnosis not present

## 2018-03-13 DIAGNOSIS — S90415A Abrasion, left lesser toe(s), initial encounter: Secondary | ICD-10-CM

## 2018-03-13 MED ORDER — METOPROLOL TARTRATE 25 MG PO TABS: 25.0000 mg | ORAL_TABLET | Freq: Two times a day (BID) | ORAL | 3 refills | Status: DC

## 2018-03-13 NOTE — Progress Notes (Signed)
Careteam: Patient Care Team: [name], NP as PCP - General (Geriatric Medicine)  Advanced Directive information    Allergies  Allergen Reactions  . Aleve [Naproxen Sodium]     Upset stomach   . Nsaids     Bother his stomach  . Antihistamines, Diphenhydramine-Type     Causes urinary issues    Chief Complaint  Patient presents with  . Acute Visit    Pt is being seen due to elevated blood pressure and headache. this morning's BP was 163/100 with 85 pulse. pt describes headache as pain on top of  left forehead.      HPI: Patient is a 84 y.o. male seen in the office today due to elevated blood pressure.  Headache started yesterday but has had them off and on for years. Headache above left eye.   Wife took blood pressure last night when he said he had a headache.  Last night blood pressure was 197/122, on recheck 169/117 This morning 163/100.  Previously (years ago) on blood pressure medication but was taken off after a hospitalization and has been fine however over the last few months blood pressure has been getting worse.  Denies worsening of vision, weakness   Stopped doxycyline yesterday. Took 7 days worth, overall toe doing much better. nontender and redness has improved.  Using iodine cream to left great toe to help heal. Got this from wound care.  Hitting legs and causes scratch/abrasions to legs also using iodine cream for this.   Review of Systems:  Review of Systems  Constitutional: Negative for chills, fever and malaise/fatigue.  HENT: Negative for congestion, ear discharge, ear pain and sinus pain.   Eyes:       Glasses  Respiratory: Positive for cough. Negative for sputum production and shortness of breath.   Cardiovascular: Negative for chest pain, palpitations and leg swelling.  Gastrointestinal: Negative for abdominal pain, constipation, diarrhea, heartburn, nausea and vomiting.  Genitourinary: Negative for dysuria, frequency and hematuria.    Musculoskeletal: Negative for falls and myalgias.  Skin: Negative for itching and rash.       Wound to left great toe that is doing better  Neurological: Positive for tremors, sensory change, weakness and headaches. Negative for dizziness, tingling, speech change, focal weakness and loss of consciousness.   Past Medical History:  Diagnosis Date  . Abnormality of gait   . Benign neoplasm of colon   . Chronic kidney disease, stage III (moderate) (HCC)   . Complication of anesthesia    postop headache after general anesthesia  . Degenerative arthritis   . Diverticulosis of colon (without mention of hemorrhage)   . Duodenal ulcer, unspecified as acute or chronic, without hemorrhage, perforation, or obstruction   . Encounter for long-term (current) use of other medications   . Enlarged prostate    takes Tamsulosin daily  . Essential and other specified forms of tremor   . Foley catheter in place 09-01-13   4 months now  . Foot drop, bilateral   . Gait disorder    balance issues-uses walker or mobile chair.  . Gout    takes Allopurinol daily  . Gout, unspecified   . Hyperlipidemia    but doesn't require meds  . Hypertension    takes Enalapril daily   . Inguinal hernia without mention of obstruction or gangrene, unilateral or unspecified, (not specified as recurrent)   . Internal hemorrhoids without mention of complication   . Macular degeneration    takes  eye cap vits daily  . Other abnormal blood chemistry   . Peripheral neuropathy   . Peripheral neuropathy   . Pneumonia    hx of;last time 8'14-mild.  . Ptosis    Left side  . Spinal stenosis, lumbar region, with neurogenic claudication   . TIA (transient ischemic attack)    sees Dr.Willis-neurologist 6'14(strange feeling of head)-no residual  . Unspecified vitamin D deficiency   . Urinary frequency   . Urinary urgency   . UTI (lower urinary tract infection) 09-01-13   tx. 2 weeks ago   Past Surgical History:  Procedure  Laterality Date  . CATARACT EXTRACTION Right 08/31/2014   Dr.Lyles  . colonosocpy    . FINGER SURGERY Left    Left ring finger  . HERNIA REPAIR     at least 38yrs ago  . HIP PINNING,CANNULATED Left 05/05/2013   Procedure: CANNULATED HIP PINNING- left;  Surgeon: Renette Butters, MD;  Location: [facility];  Service: Orthopedics;  Laterality: Left;  . INGUINAL HERNIA REPAIR  09/05/2012   Procedure: LAPAROSCOPIC INGUINAL HERNIA;  Surgeon: Gayland Curry, MD,FACS;  Location: [facility];  Service: General;  Laterality: Left;  . INSERTION OF MESH  09/05/2012   Procedure: INSERTION OF MESH;  Surgeon: Gayland Curry, MD,FACS;  Location: [facility];  Service: General;  Laterality: Left;  . left leg surgery  2011  . NERVE BIOPSY    . TRANSURETHRAL RESECTION OF PROSTATE N/A 09/07/2013   Procedure: CYSTO TRANSURETHRAL RESECTION OF THE PROSTATE (TURP);  Surgeon: Reece Packer, MD;  Location: [facility];  Service: Urology;  Laterality: N/A;   Social History:   reports that he quit smoking about 32 years ago. His smoking use included cigarettes. He quit smokeless tobacco use about 17 years ago. He reports that he does not drink alcohol or use drugs.  Family History  Problem Relation Age of Onset  . Diabetes Mother        type 2  . Clotting disorder Mother        deceased, blood clot  . Leukemia Father   . COPD Brother   . Lung cancer Sister   . Heart disease Brother        1/2 brother    Medications: Patient's Medications  New Prescriptions   No medications on file  Previous Medications   ACETAMINOPHEN (TYLENOL) 500 MG TABLET    Take 500-1,000 mg by mouth every 6 (six) hours as needed.   ALLOPURINOL (ZYLOPRIM) 100 MG TABLET    TAKE 1 TABLET BY MOUTH TWICE DAILY TO PREVENT GOUT.   B COMPLEX VITAMINS CAPSULE    Take 1 capsule by mouth daily.    CHOLECALCIFEROL (VITAMIN D3) 2000 UNITS TABS    Take by mouth daily.   CLOPIDOGREL (PLAVIX) 75 MG TABLET    TAKE 1 TABLET BY MOUTH ONCE DAILY FOR  ANTICOAGULATION    DOXYCYCLINE (VIBRA-TABS) 100 MG TABLET    Take 1 tablet (100 mg total) by mouth 2 (two) times daily.   FINASTERIDE (PROSCAR) 5 MG TABLET    TAKE 1 TABLET BY MOUTH ONCE DAILY TO  HELP  SHRINK  PROSTATE   FUROSEMIDE (LASIX) 20 MG TABLET    Take 1 tablet (20 mg total) by mouth daily. daily x 3 days and then daily prn swelling   MULTIPLE VITAMINS-MINERALS (PRESERVISION AREDS) CAPS    Take 1 capsule by mouth daily.   OVER THE COUNTER MEDICATION    Take 1 capsule by mouth daily.  VISION ALIGN   PRIMIDONE (MYSOLINE) 50 MG TABLET    TAKE 1 TABLET BY MOUTH ONCE DAILY IN THE EVENING TO HELP WITH TREMORS.  Modified Medications   No medications on file  Discontinued Medications   No medications on file     Physical Exam:  Vitals:   03/13/18 1121  BP: (!) 160/98  Pulse: 73  Temp: 98.1 F (36.7 C)  TempSrc: Oral  SpO2: 96%  Weight: 163 lb (73.9 kg)  Height: 5\' 8"  (1.727 m)   Body mass index is 24.78 kg/m.  Physical Exam  Constitutional: He is oriented to person, place, and time. He appears well-developed and well-nourished.  Thin, frail   HENT:  Head: Normocephalic and atraumatic.  Right Ear: External ear normal.  Nose: Nose normal.  Mouth/Throat: Oropharynx is clear and moist.  Eyes: Pupils are equal, round, and reactive to light. Left eye exhibits no chemosis and no exudate. No foreign body present in the left eye.  Cardiovascular: Normal rate, regular rhythm and normal heart sounds.  Pulmonary/Chest: Effort normal and breath sounds normal.  Abdominal: Soft. Bowel sounds are normal.  Genitourinary: Rectum normal. Rectal exam shows guaiac negative stool.  Musculoskeletal: He exhibits no edema or tenderness.  Unstable gait. Using walker.Muscular wasting of hand muscles and forearm muscles and lower leg muscles. Contractures of all fingers in both hands.  Neurological: He is alert and oriented to person, place, and time. No cranial nerve deficit. Coordination abnormal.  Bilateral foot  drop. Tremor. Generalized weakness based on neuromuscular disease.  Skin: Skin is warm and dry.  Top of left toe with 1 x 0.5 cm open abrasion, red base with some scabbing noted. Redness has improved to great toe and without warmth  Psychiatric: He has a normal mood and affect.    Labs reviewed: Basic Metabolic Panel: Recent Labs    12/31/17 1219 02/26/18 1215 03/05/18 0924  NA 140 139 141  K 5.1 4.9 4.6  CL 103 105 107  CO2 28 28 28   GLUCOSE 84 96 83  BUN 40* 41* 46*  CREATININE 2.00* 2.18* 2.00*  CALCIUM 9.0 9.0 9.1   Liver Function Tests: Recent Labs    06/12/17 1448 12/31/17 1219 02/26/18 1215  AST 15 19 24   ALT 12 19 30   BILITOT 0.3 0.5 0.5  PROT 6.3 6.5 6.4   No results for input(s): LIPASE, AMYLASE in the last 8760 hours. No results for input(s): AMMONIA in the last 8760 hours. CBC: Recent Labs    06/12/17 1448 12/31/17 1219  WBC 7.2 6.9  NEUTROABS 3,629 3,933  HGB 13.2 13.9  HCT 39.1 40.2  MCV 95.8 95.0  PLT 161 163   Lipid Panel: No results for input(s): CHOL, HDL, LDLCALC, TRIG, CHOLHDL, LDLDIRECT in the last 8760 hours. TSH: No results for input(s): TSH in the last 8760 hours. A1C: No results found for: HGBA1C   Assessment/Plan 1. Essential hypertension -uncontrolled at this time. Diet modifications encouraged. DASH diet given  - metoprolol tartrate (LOPRESSOR) 25 MG tablet; Take 1 tablet (25 mg total) by mouth 2 (two) times daily.  Dispense: 180 tablet; Refill: 3  2. Toe abrasion, left, initial encounter Ongoing wound but cellulitis has improved. To continue home care. Okay to stop doxycycline completed 7 days and has improved.   3. Other headache syndrome Possibly related to doxycyline or hypertension. Since he has had these headaches in the past unlikely related to doxycycline. Will start lopressor to see if this helps blood pressure and headache.  Next appt: 2 weeks for blood pressure check and to follow up toe.  Carlos American. [place],  [facility] [phone]

## 2018-03-13 NOTE — Patient Instructions (Addendum)
To continue care to left toe Notify if area stops healing or worsens  To start metoprolol 25 mg by mouth twice daily for high blood pressure  Follow up in 2 weeks on blood pressure and toe   DASH Eating Plan DASH stands for "Dietary Approaches to Stop Hypertension." The DASH eating plan is a healthy eating plan that has been shown to reduce high blood pressure (hypertension). It may also reduce your risk for type 2 diabetes, heart disease, and stroke. The DASH eating plan may also help with weight loss. What are tips for following this plan? General guidelines  Avoid eating more than 2,300 mg (milligrams) of salt (sodium) a day. If you have hypertension, you may need to reduce your sodium intake to 1,500 mg a day.  Limit alcohol intake to no more than 1 drink a day for nonpregnant women and 2 drinks a day for men. One drink equals 12 oz of beer, 5 oz of wine, or 1 oz of hard liquor.  Work with your health care provider to maintain a healthy body weight or to lose weight. Ask what an ideal weight is for you.  Get at least 30 minutes of exercise that causes your heart to beat faster (aerobic exercise) most days of the week. Activities may include walking, swimming, or biking.  Work with your health care provider or diet and nutrition specialist (dietitian) to adjust your eating plan to your individual calorie needs. Reading food labels  Check food labels for the amount of sodium per serving. Choose foods with less than 5 percent of the Daily Value of sodium. Generally, foods with less than 300 mg of sodium per serving fit into this eating plan.  To find whole grains, look for the word "whole" as the first word in the ingredient list. Shopping  Buy products labeled as "low-sodium" or "no salt added."  Buy fresh foods. Avoid canned foods and premade or frozen meals. Cooking  Avoid adding salt when cooking. Use salt-free seasonings or herbs instead of table salt or sea salt. Check with  your health care provider or pharmacist before using salt substitutes.  Do not fry foods. Cook foods using healthy methods such as baking, boiling, grilling, and broiling instead.  Cook with heart-healthy oils, such as olive, canola, soybean, or sunflower oil. Meal planning   Eat a balanced diet that includes: ? 5 or more servings of fruits and vegetables each day. At each meal, try to fill half of your plate with fruits and vegetables. ? Up to 6-8 servings of whole grains each day. ? Less than 6 oz of lean meat, poultry, or fish each day. A 3-oz serving of meat is about the same size as a deck of cards. One egg equals 1 oz. ? 2 servings of low-fat dairy each day. ? A serving of nuts, seeds, or beans 5 times each week. ? Heart-healthy fats. Healthy fats called Omega-3 fatty acids are found in foods such as flaxseeds and coldwater fish, like sardines, salmon, and mackerel.  Limit how much you eat of the following: ? Canned or prepackaged foods. ? Food that is high in trans fat, such as fried foods. ? Food that is high in saturated fat, such as fatty meat. ? Sweets, desserts, sugary drinks, and other foods with added sugar. ? Full-fat dairy products.  Do not salt foods before eating.  Try to eat at least 2 vegetarian meals each week.  Eat more home-cooked food and less restaurant, buffet, and fast  food.  When eating at a restaurant, ask that your food be prepared with less salt or no salt, if possible. What foods are recommended? The items listed may not be a complete list. Talk with your dietitian about what dietary choices are best for you. Grains Whole-grain or whole-wheat bread. Whole-grain or whole-wheat pasta. Brown rice. Modena Morrow. Bulgur. Whole-grain and low-sodium cereals. Pita bread. Low-fat, low-sodium crackers. Whole-wheat flour tortillas. Vegetables Fresh or frozen vegetables (raw, steamed, roasted, or grilled). Low-sodium or reduced-sodium tomato and vegetable  juice. Low-sodium or reduced-sodium tomato sauce and tomato paste. Low-sodium or reduced-sodium canned vegetables. Fruits All fresh, dried, or frozen fruit. Canned fruit in natural juice (without added sugar). Meat and other protein foods Skinless chicken or [place]. Ground chicken or [place]. Pork with fat trimmed off. Fish and seafood. Egg whites. Dried beans, peas, or lentils. Unsalted nuts, nut butters, and seeds. Unsalted canned beans. Lean cuts of beef with fat trimmed off. Low-sodium, lean deli meat. Dairy Low-fat (1%) or fat-free (skim) milk. Fat-free, low-fat, or reduced-fat cheeses. Nonfat, low-sodium ricotta or cottage cheese. Low-fat or nonfat yogurt. Low-fat, low-sodium cheese. Fats and oils Soft margarine without trans fats. Vegetable oil. Low-fat, reduced-fat, or light mayonnaise and salad dressings (reduced-sodium). Canola, safflower, olive, soybean, and sunflower oils. Avocado. Seasoning and other foods Herbs. Spices. Seasoning mixes without salt. Unsalted popcorn and pretzels. Fat-free sweets. What foods are not recommended? The items listed may not be a complete list. Talk with your dietitian about what dietary choices are best for you. Grains Baked goods made with fat, such as croissants, muffins, or some breads. Dry pasta or rice meal packs. Vegetables Creamed or fried vegetables. Vegetables in a cheese sauce. Regular canned vegetables (not low-sodium or reduced-sodium). Regular canned tomato sauce and paste (not low-sodium or reduced-sodium). Regular tomato and vegetable juice (not low-sodium or reduced-sodium). Angie Fava. Olives. Fruits Canned fruit in a light or heavy syrup. Fried fruit. Fruit in cream or butter sauce. Meat and other protein foods Fatty cuts of meat. Ribs. Fried meat. Berniece Salines. Sausage. Bologna and other processed lunch meats. Salami. Fatback. Hotdogs. Bratwurst. Salted nuts and seeds. Canned beans with added salt. Canned or smoked fish. Whole eggs or egg yolks.  Chicken or [place] with skin. Dairy Whole or 2% milk, cream, and half-and-half. Whole or full-fat cream cheese. Whole-fat or sweetened yogurt. Full-fat cheese. Nondairy creamers. Whipped toppings. Processed cheese and cheese spreads. Fats and oils Butter. Stick margarine. Lard. Shortening. Ghee. Bacon fat. Tropical oils, such as coconut, palm kernel, or palm oil. Seasoning and other foods Salted popcorn and pretzels. Onion salt, garlic salt, seasoned salt, table salt, and sea salt. Worcestershire sauce. Tartar sauce. Barbecue sauce. Teriyaki sauce. Soy sauce, including reduced-sodium. Steak sauce. Canned and packaged gravies. Fish sauce. Oyster sauce. Cocktail sauce. Horseradish that you find on the shelf. Ketchup. Mustard. Meat flavorings and tenderizers. Bouillon cubes. Hot sauce and Tabasco sauce. Premade or packaged marinades. Premade or packaged taco seasonings. Relishes. Regular salad dressings. Where to find more information:  National Heart, Lung, and [place]: https://wilson-eaton.com/  American Heart Association: www.heart.org Summary  The DASH eating plan is a healthy eating plan that has been shown to reduce high blood pressure (hypertension). It may also reduce your risk for type 2 diabetes, heart disease, and stroke.  With the DASH eating plan, you should limit salt (sodium) intake to 2,300 mg a day. If you have hypertension, you may need to reduce your sodium intake to 1,500 mg a day.  When on the DASH  eating plan, aim to eat more fresh fruits and vegetables, whole grains, lean proteins, low-fat dairy, and heart-healthy fats.  Work with your health care provider or diet and nutrition specialist (dietitian) to adjust your eating plan to your individual calorie needs. This information is not intended to replace advice given to you by your health care provider. Make sure you discuss any questions you have with your health care provider. Document Released: 09/06/2011 Document Revised:  09/10/2016 Document Reviewed: 09/10/2016 Elsevier Interactive Patient Education  [employer].

## 2018-03-17 ENCOUNTER — Telehealth: Payer: Self-pay | Admitting: *Deleted

## 2018-03-17 ENCOUNTER — Other Ambulatory Visit: Payer: Self-pay | Admitting: Internal Medicine

## 2018-03-17 DIAGNOSIS — R05 Cough: Secondary | ICD-10-CM

## 2018-03-17 DIAGNOSIS — R059 Cough, unspecified: Secondary | ICD-10-CM

## 2018-03-17 DIAGNOSIS — J449 Chronic obstructive pulmonary disease, unspecified: Secondary | ICD-10-CM

## 2018-03-17 NOTE — Telephone Encounter (Signed)
Metoprolol is a twice daily medication to give him coverage throughout the day. To take blood pressure before bed just to make sure blood pressure is not high later in the evening if he is only taking once daily

## 2018-03-17 NOTE — Telephone Encounter (Signed)
I spoke with patient's wife and she was unsure of continuing to give the medication twice daily. After discussing with [name]- patient is to take medication one time daily and to check blood pressures twice a day- morning and right before bed.   [name] verbalized understanding.

## 2018-03-17 NOTE — Telephone Encounter (Signed)
Drew Gibson, wife called and stated that patient saw [name] Thursday and given Metoprolol twice daily. Wife stated that she has only been giving him one a day due to his blood pressure coming down. Stated that she didn't want it to drop too low. Please Advise.   Thurs. 165/102 Fri. Am 146/91 PM 139/87 Sat. Am 104/91 Pulse 86   PM 120/76 P 76 Sun. 141/77 Pulse 77 Mon. 138/75 Pulse 72

## 2018-03-18 DIAGNOSIS — S92415A Nondisplaced fracture of proximal phalanx of left great toe, initial encounter for closed fracture: Secondary | ICD-10-CM | POA: Diagnosis not present

## 2018-03-25 ENCOUNTER — Telehealth: Payer: Self-pay | Admitting: *Deleted

## 2018-03-25 NOTE — Telephone Encounter (Signed)
I spoke with patient's wife and she scheduled an appointment for in the morning. Per verbal from [place], if patient's SOB becomes worse over night, pt should seek care at Urgent Care or the ED.

## 2018-03-25 NOTE — Telephone Encounter (Signed)
Unaware of any pulmonary referral placed. If he is having increased shortness of breath and cough he will need to be seen. Blood pressure appears to be under good control.

## 2018-03-25 NOTE — Telephone Encounter (Signed)
Patient wife, Drew Gibson called and stated that patient is still having swelling in his feet. Right is worse than Left. Has been giving him a fluid pill as needed. Only have 3 pills left. Also having Increased SOB and cough and haven't heard from anyone regarding the pulmonary Referral. Patient has been taking medications as directed. BP this morning was 133/88.Please Advise.

## 2018-03-26 ENCOUNTER — Ambulatory Visit (INDEPENDENT_AMBULATORY_CARE_PROVIDER_SITE_OTHER): Payer: Medicare Other | Admitting: Nurse Practitioner

## 2018-03-26 ENCOUNTER — Encounter: Payer: Self-pay | Admitting: Nurse Practitioner

## 2018-03-26 ENCOUNTER — Emergency Department (HOSPITAL_COMMUNITY)
Admission: EM | Admit: 2018-03-26 | Discharge: 2018-03-26 | Disposition: A | Discharge status: Home / Self Care | Payer: Medicare Other | Attending: Emergency Medicine | Admitting: Emergency Medicine

## 2018-03-26 ENCOUNTER — Encounter (HOSPITAL_COMMUNITY): Payer: Self-pay

## 2018-03-26 ENCOUNTER — Emergency Department (HOSPITAL_COMMUNITY): Payer: Medicare Other

## 2018-03-26 VITALS — BP 132/80 | HR 77 | Temp 97.9°F | Ht 68.0 in | Wt 165.0 lb

## 2018-03-26 DIAGNOSIS — Y92511 Restaurant or cafe as the place of occurrence of the external cause: Secondary | ICD-10-CM | POA: Diagnosis not present

## 2018-03-26 DIAGNOSIS — S32591A Other specified fracture of right pubis, initial encounter for closed fracture: Secondary | ICD-10-CM | POA: Insufficient documentation

## 2018-03-26 DIAGNOSIS — Y998 Other external cause status: Secondary | ICD-10-CM | POA: Insufficient documentation

## 2018-03-26 DIAGNOSIS — M25551 Pain in right hip: Secondary | ICD-10-CM | POA: Diagnosis not present

## 2018-03-26 DIAGNOSIS — Z7902 Long term (current) use of antithrombotics/antiplatelets: Secondary | ICD-10-CM | POA: Diagnosis not present

## 2018-03-26 DIAGNOSIS — N184 Chronic kidney disease, stage 4 (severe): Secondary | ICD-10-CM

## 2018-03-26 DIAGNOSIS — W010XXA Fall on same level from slipping, tripping and stumbling without subsequent striking against object, initial encounter: Secondary | ICD-10-CM | POA: Diagnosis not present

## 2018-03-26 DIAGNOSIS — R0602 Shortness of breath: Secondary | ICD-10-CM

## 2018-03-26 DIAGNOSIS — Z79899 Other long term (current) drug therapy: Secondary | ICD-10-CM | POA: Diagnosis not present

## 2018-03-26 DIAGNOSIS — R059 Cough, unspecified: Secondary | ICD-10-CM

## 2018-03-26 DIAGNOSIS — Y9389 Activity, other specified: Secondary | ICD-10-CM | POA: Insufficient documentation

## 2018-03-26 DIAGNOSIS — I10 Essential (primary) hypertension: Secondary | ICD-10-CM

## 2018-03-26 DIAGNOSIS — S32501A Unspecified fracture of right pubis, initial encounter for closed fracture: Secondary | ICD-10-CM | POA: Diagnosis not present

## 2018-03-26 DIAGNOSIS — I129 Hypertensive chronic kidney disease with stage 1 through stage 4 chronic kidney disease, or unspecified chronic kidney disease: Secondary | ICD-10-CM | POA: Insufficient documentation

## 2018-03-26 DIAGNOSIS — Z87891 Personal history of nicotine dependence: Secondary | ICD-10-CM | POA: Diagnosis not present

## 2018-03-26 DIAGNOSIS — R600 Localized edema: Secondary | ICD-10-CM

## 2018-03-26 DIAGNOSIS — S79911A Unspecified injury of right hip, initial encounter: Secondary | ICD-10-CM | POA: Diagnosis not present

## 2018-03-26 DIAGNOSIS — R05 Cough: Secondary | ICD-10-CM

## 2018-03-26 MED ORDER — METOPROLOL TARTRATE 25 MG PO TABS: 25.0000 mg | ORAL_TABLET | Freq: Two times a day (BID) | ORAL | 3 refills | Status: DC

## 2018-03-26 MED ORDER — FUROSEMIDE 20 MG PO TABS: 20.0000 mg | ORAL_TABLET | Freq: Every day | ORAL | 0 refills | Status: DC

## 2018-03-26 MED ORDER — TRAMADOL HCL 50 MG PO TABS
50.0000 mg | ORAL_TABLET | Freq: Once | ORAL | Status: AC
  Administered 2018-03-26: 50 mg via ORAL
  Filled 2018-03-26: qty 1

## 2018-03-26 MED ORDER — FUROSEMIDE 20 MG PO TABS: 20.0000 mg | ORAL_TABLET | Freq: Every day | ORAL | 0 refills | Status: DC | PRN

## 2018-03-26 MED ORDER — TRAMADOL HCL 50 MG PO TABS: 50.0000 mg | ORAL_TABLET | Freq: Four times a day (QID) | ORAL | 0 refills | Status: DC | PRN

## 2018-03-26 NOTE — Patient Instructions (Addendum)
To continue lopressor twice daily  To use lasix as needed for swelling- we are limiting this due to his kidney disease Keep legs elevated, low sodium diet and use compression socks/hose  Increase activity slowly and as tolerated   Pulmonary function test have been order to assess lung function.   Follow up with Dr Eulas Post in 2-3 months for routine follow up.  Okay to cancel appt for next week

## 2018-03-26 NOTE — ED Provider Notes (Signed)
[facility] DEPT Provider Note   CSN: [id number] Arrival date & time: 03/26/18  1154     History   Chief Complaint Chief Complaint  Patient presents with  . Fall  . Hip Pain    HPI Drew Gibson is a 84 y.o. male.  Patient c/o mechanical fall with right hip pain just prior to ED visit today. Was at [place], reached for support bar but missed it, and fell onto right hip. Right hip pain since fall, moderate, persistent, worse w wt bearing. Was able to walk/stand. Denies head injury or headache. No neck or back pain. Denies other pain or injury. No faintness or dizziness prior to fall. No anticoag use.   The history is provided by the patient and the spouse.  Fall  Pertinent negatives include no chest pain, no abdominal pain, no headaches and no shortness of breath.  Hip Pain  Pertinent negatives include no chest pain, no abdominal pain, no headaches and no shortness of breath.    Past Medical History:  Diagnosis Date  . Abnormality of gait   . Benign neoplasm of colon   . Chronic kidney disease, stage III (moderate) (HCC)   . Complication of anesthesia    postop headache after general anesthesia  . Degenerative arthritis   . Diverticulosis of colon (without mention of hemorrhage)   . Duodenal ulcer, unspecified as acute or chronic, without hemorrhage, perforation, or obstruction   . Encounter for long-term (current) use of other medications   . Enlarged prostate    takes Tamsulosin daily  . Essential and other specified forms of tremor   . Foley catheter in place 09-01-13   4 months now  . Foot drop, bilateral   . Gait disorder    balance issues-uses walker or mobile chair.  . Gout    takes Allopurinol daily  . Gout, unspecified   . Hyperlipidemia    but doesn't require meds  . Hypertension    takes Enalapril daily   . Inguinal hernia without mention of obstruction or gangrene, unilateral or unspecified, (not specified as recurrent)    . Internal hemorrhoids without mention of complication   . Macular degeneration    takes eye cap vits daily  . Other abnormal blood chemistry   . Peripheral neuropathy   . Peripheral neuropathy   . Pneumonia    hx of;last time 8'14-mild.  . Ptosis    Left side  . Spinal stenosis, lumbar region, with neurogenic claudication   . TIA (transient ischemic attack)    sees Dr.Willis-neurologist 6'14(strange feeling of head)-no residual  . Unspecified vitamin D deficiency   . Urinary frequency   . Urinary urgency   . UTI (lower urinary tract infection) 09-01-13   tx. 2 weeks ago    Patient Active Problem List   Diagnosis Date Noted  . PAD (peripheral artery disease) ([place]) 02/26/2018  . CKD (chronic kidney disease) stage 4, GFR 15-29 ml/min (HCC) 02/26/2018  . Aspiration pneumonia ([place]) 01/23/2017  . Decubitus ulcer of heel 11/14/2016  . Closed right femoral fracture ([place]) 11/14/2016  . Status post hip surgery 08/26/2016  . Wound, open, foot 02/08/2016  . Contracture of finger joint 09/28/2015  . Cough 06/29/2015  . Myalgia and myositis 06/29/2015  . Loose stools 02/16/2015  . History of fall 04/28/2014  . Contracture of joint of hand 04/28/2014  . BPPV (benign paroxysmal positional vertigo) 02/17/2014  . Dysphagia 09/29/2013  . Benign essential tremor 06/15/2013  . Hypertension  06/15/2013  . GERD (gastroesophageal reflux disease) 06/15/2013  . Closed left femoral fracture ([place]) 05/04/2013  . Hyperlipidemia 05/03/2013  . Polyneuropathy in other diseases classified elsewhere ([place]) 05/03/2013  . Spinal stenosis, lumbar region, with neurogenic claudication 05/03/2013  . Cerebrovascular disease 04/28/2013  . Anisocoria 04/28/2013  . PSA elevation 04/28/2013  . Cerebral infarction ([place]) 03/09/2013  . Unspecified ptosis of eyelid 12/03/2012  . Abnormality of gait 12/03/2012  . CKD (chronic kidney disease), stage III ([place]) 08/06/2011  . BPH (benign prostatic hyperplasia)  08/05/2011  . Gout 08/05/2011    Past Surgical History:  Procedure Laterality Date  . CATARACT EXTRACTION Right 08/31/2014   Dr.Lyles  . colonosocpy    . FINGER SURGERY Left    Left ring finger  . HERNIA REPAIR     at least 32yrs ago  . HIP PINNING,CANNULATED Left 05/05/2013   Procedure: CANNULATED HIP PINNING- left;  Surgeon: Renette Butters, MD;  Location: [facility];  Service: Orthopedics;  Laterality: Left;  . INGUINAL HERNIA REPAIR  09/05/2012   Procedure: LAPAROSCOPIC INGUINAL HERNIA;  Surgeon: Gayland Curry, MD,FACS;  Location: [facility];  Service: General;  Laterality: Left;  . INSERTION OF MESH  09/05/2012   Procedure: INSERTION OF MESH;  Surgeon: Gayland Curry, MD,FACS;  Location: [facility];  Service: General;  Laterality: Left;  . left leg surgery  2011  . NERVE BIOPSY    . TRANSURETHRAL RESECTION OF PROSTATE N/A 09/07/2013   Procedure: CYSTO TRANSURETHRAL RESECTION OF THE PROSTATE (TURP);  Surgeon: Reece Packer, MD;  Location: [facility];  Service: Urology;  Laterality: N/A;        Home Medications    Prior to Admission medications   Medication Sig Start Date End Date Taking? Authorizing Provider  acetaminophen (TYLENOL) 500 MG tablet Take 500-1,000 mg by mouth every 6 (six) hours as needed.    [provider]  allopurinol (ZYLOPRIM) 100 MG tablet TAKE 1 TABLET BY MOUTH TWICE DAILY TO PREVENT GOUT. 09/13/17   [name], NP  b complex vitamins capsule Take 1 capsule by mouth daily.     [provider]  Cholecalciferol (VITAMIN D3) 2000 UNITS TABS Take by mouth daily.    [provider]  clopidogrel (PLAVIX) 75 MG tablet TAKE 1 TABLET BY MOUTH ONCE DAILY FOR  ANTICOAGULATION 02/25/18   [name], NP  finasteride (PROSCAR) 5 MG tablet TAKE 1 TABLET BY MOUTH ONCE DAILY TO  HELP  SHRINK  PROSTATE 12/02/17   [name], NP  furosemide (LASIX) 20 MG tablet Take 1 tablet (20 mg total) by mouth daily. daily x 3 days and then daily prn  swelling 03/26/18   [name], NP  metoprolol tartrate (LOPRESSOR) 25 MG tablet Take 1 tablet (25 mg total) by mouth 2 (two) times daily. 03/26/18   [name], NP  Multiple Vitamins-Minerals (PRESERVISION AREDS) CAPS Take 1 capsule by mouth daily.    [provider]  OVER THE COUNTER MEDICATION Take 1 capsule by mouth daily. VISION ALIGN    [provider]  primidone (MYSOLINE) 50 MG tablet TAKE 1 TABLET BY MOUTH ONCE DAILY IN THE EVENING TO HELP WITH TREMORS. 09/13/17   [name], NP    Family History Family History  Problem Relation Age of Onset  . Diabetes Mother        type 2  . Clotting disorder Mother        deceased, blood clot  . Leukemia  Father   . COPD Brother   . Lung cancer Sister   . Heart disease Brother        1/2 brother    Social History Social History   Tobacco Use  . Smoking status: Former Smoker    Types: Cigarettes    Last attempt to quit: 05/10/1985    Years since quitting: 32.8  . Smokeless tobacco: Former Systems developer    Quit date: 02/07/2001  Substance Use Topics  . Alcohol use: No    Alcohol/week: 0.0 oz  . Drug use: No     Allergies   Aleve [naproxen sodium]; Nsaids; and Antihistamines, diphenhydramine-type   Review of Systems Review of Systems  Constitutional: Negative for fever.  HENT: Negative for nosebleeds.   Eyes: Negative for visual disturbance.  Respiratory: Negative for shortness of breath.   Cardiovascular: Negative for chest pain.  Gastrointestinal: Negative for abdominal pain and vomiting.  Genitourinary: Negative for flank pain.  Musculoskeletal: Negative for back pain and neck pain.  Skin: Negative for wound.  Neurological: Negative for weakness, numbness and headaches.  Hematological: Does not bruise/bleed easily.  Psychiatric/Behavioral: Negative for confusion.     Physical Exam Updated Vital Signs BP (!) 148/74   Pulse 74   Temp (!) 97.5 F (36.4 C) (Axillary)   Resp 15   Ht  1.727 m (5\' 8" )   Wt 74.8 kg (165 lb)   SpO2 96%   BMI 25.09 kg/m   Physical Exam  Constitutional: He is oriented to person, place, and time. He appears well-developed and well-nourished.  HENT:  Head: Atraumatic.  Mouth/Throat: Oropharynx is clear and moist.  Eyes: Pupils are equal, round, and reactive to light. Conjunctivae are normal.  Neck: Normal range of motion. Neck supple. No tracheal deviation present.  Cardiovascular: Normal rate, regular rhythm, normal heart sounds and intact distal pulses.  Pulmonary/Chest: Effort normal and breath sounds normal. No accessory muscle usage. No respiratory distress. He exhibits no tenderness.  Abdominal: He exhibits no distension. There is no tenderness.  Musculoskeletal: He exhibits no edema.  Tenderness right hip. Distal pulses palp. No other focal bony tenderness. CTLS spine, non tender, aligned, no step off.   Neurological: He is alert and oriented to person, place, and time.  Speech normal. Motor/sens grossly intact bil.   Skin: Skin is warm and dry. He is not diaphoretic.  Psychiatric: He has a normal mood and affect.  Nursing note and vitals reviewed.    ED Treatments / Results  Labs (all labs ordered are listed, but only abnormal results are displayed) Labs Reviewed - No data to display  EKG None  Radiology Dg Hip Unilat  With Pelvis 2-3 Views Right  Result Date: 03/26/2018 CLINICAL DATA:  Status post fall [employer] today.  Right hip pain. EXAM: DG HIP (WITH OR WITHOUT PELVIS) 2-3V RIGHT COMPARISON:  None. FINDINGS: There is questioned minimal deformity of the cortex of the right inferior pubic rami, fracture is not excluded. There is no dislocation. Prior fixation of bilateral femur are noted. Marked degenerative joint changes of bilateral hips with narrowed joint space and osteophyte formation are noted. IMPRESSION: Question minimal deformity of the cortex of the right inferior pubic rami, fracture is not excluded.  Electronically Signed   By: Abelardo Diesel M.D.   On: 03/26/2018 13:48    Procedures Procedures (including critical care time)  Medications Ordered in ED Medications - No data to display   Initial Impression / Assessment and Plan / ED Course  I  have reviewed the triage vital signs and the nursing notes.  Pertinent labs & imaging results that were available during my care of the patient were reviewed by me and considered in my medical decision making (see chart for details).  Imaging study ordered.  Reviewed nursing notes and prior charts for additional history.   xrays reviewed - suspect pubic rami fx.   Ultram po.  Discussed xrays w pt.  Pt is ambulatory.   Pt currently appears stable for d/c.     Final Clinical Impressions(s) / ED Diagnoses   Final diagnoses:  None    ED Discharge Orders    None       Lajean Saver, MD 03/26/18 [id number]

## 2018-03-26 NOTE — Care Management Note (Signed)
Case Management Note  Patient Details  Name: Drew Gibson MRN: [id number] Date of Birth: 05-03-1929  CM consulted for [name] services.  CM spoke with pt and spouse at bedside who advised they have used [facility] in the past and would like to use them again.  CM discussed DME and was told they have all needed equipment except possibly a hospital bed.  The pt was not ready for one yet.  CM discussed other options for DME from stores that could assist with some of the issues pt is currently having and that when he was ready he could ask the [facility] agency or his PCP directly for a hospital bed.  CM also encouraged pt go to his previously scheduled pulmonology appointment tomorrow.  Pt and spouse had no further questions.  CM contacted [name] with [facility] who accepted pt for services.  Updated Dr. Ashok Cordia.  No further CM needs noted at this time.  Expected Discharge Date:   11/26/2017               Expected Discharge Plan:  [place]  Discharge planning Services  CM Consult  Post Acute Care Choice:  Home Health Choice offered to:  Patient, Spouse  HH Arranged:  RN, PT, OT, Nurse's Aide, Social Work [employer] Agency:  [facility]  Status of Service:  Completed, signed off  [name], RN 03/26/2018, 3:08 PM

## 2018-03-26 NOTE — Discharge Instructions (Addendum)
It was our pleasure to provide your ER care today - we hope that you feel better.  Fall precautions.  You may take ultram as need for pain.  We have made referral for home health services - they will be contacting you shortly.  Return to ER if worse, new symptoms, new or intractable pain, fevers, trouble breathing, other concern.

## 2018-03-26 NOTE — ED Notes (Signed)
RN attempted to ambulate patient with a walker. Pt was able to take 2 steps before he stated he felt unable to continue. Pt reports he "feels stiff" and unable to walk. Pt also reports pain.

## 2018-03-26 NOTE — ED Triage Notes (Signed)
Patient was using the bathroom at [facility] and missed grabbing the handicap bar in the bathroom. Patient denies hitting his head. Patient reports that he landed on his right hip and is now having right hip pain.

## 2018-03-26 NOTE — Progress Notes (Signed)
Careteam: Patient Care Team: Drew Chandler, NP as PCP - General (Geriatric Medicine)  Advanced Directive information Does Patient Have a Medical Advance Directive?: No  Allergies  Allergen Reactions  . Aleve [Naproxen Sodium]     Upset stomach   . Nsaids     Bother his stomach  . Antihistamines, Diphenhydramine-Type     Causes urinary issues    Chief Complaint  Patient presents with  . Acute Visit    Pt is being seen for increased SOB for last week.      HPI: Patient is a 84 y.o. male seen in the office today due to increase shortness of breath.  Cough has improved since he was treated with pneumonia. Wife concerned that he is more short of breath with activity. Pt reports he is not short of breath with moving around the house or currently but when he has to get from car back into the house- he goes up the ramp and travels a greater distance than usual he will get short of breath. He sits in a chair to rest for a brief period and then able to get up and go the rest of the way. Without current shortness of breath. Pt finished PT  In May and things were overall better but as time goes by breathing gets worse/weaker.  Therapist gave him exercises but has not routinely done this.  Very sedentary.   Blood pressure is improving. Taking blood pressure at home twice daily. No low blood pressure. Tolerating lopressor without side effects.   Went to Dr Drew Gibson, Podiatrist, he cuts his toe nail routinely. Gave him cleocin for 1 week with improvement in wound to left great toe. Continues to soak and use betadine twice daily with ongoing healing. Following with Dr Drew Gibson 10th of July.   Review of Systems:  Review of Systems  Constitutional: Negative for chills, fever and malaise/fatigue.  HENT: Negative for congestion, ear discharge, ear pain and sinus pain.   Eyes:       Glasses  Respiratory: Positive for cough (improved) and shortness of breath (with activity). Negative for sputum  production.   Cardiovascular: Negative for chest pain, palpitations and leg swelling.  Gastrointestinal: Negative for abdominal pain, constipation and diarrhea.  Musculoskeletal: Negative for falls and myalgias.  Skin: Negative for itching and rash.       Wound to left great toe that is doing better  Neurological: Positive for tremors, sensory change and weakness. Negative for dizziness, tingling, speech change, focal weakness, loss of consciousness and headaches.    Past Medical History:  Diagnosis Date  . Abnormality of gait   . Benign neoplasm of colon   . Chronic kidney disease, stage III (moderate) (HCC)   . Complication of anesthesia    postop headache after general anesthesia  . Degenerative arthritis   . Diverticulosis of colon (without mention of hemorrhage)   . Duodenal ulcer, unspecified as acute or chronic, without hemorrhage, perforation, or obstruction   . Encounter for long-term (current) use of other medications   . Enlarged prostate    takes Tamsulosin daily  . Essential and other specified forms of tremor   . Foley catheter in place 09-01-13   4 months now  . Foot drop, bilateral   . Gait disorder    balance issues-uses walker or mobile chair.  . Gout    takes Allopurinol daily  . Gout, unspecified   . Hyperlipidemia    but doesn't require meds  .  Hypertension    takes Enalapril daily   . Inguinal hernia without mention of obstruction or gangrene, unilateral or unspecified, (not specified as recurrent)   . Internal hemorrhoids without mention of complication   . Macular degeneration    takes eye cap vits daily  . Other abnormal blood chemistry   . Peripheral neuropathy   . Peripheral neuropathy   . Pneumonia    hx of;last time 8'14-mild.  . Ptosis    Left side  . Spinal stenosis, lumbar region, with neurogenic claudication   . TIA (transient ischemic attack)    sees Dr.Willis-neurologist 6'14(strange feeling of head)-no residual  . Unspecified vitamin  D deficiency   . Urinary frequency   . Urinary urgency   . UTI (lower urinary tract infection) 09-01-13   tx. 2 weeks ago   Past Surgical History:  Procedure Laterality Date  . CATARACT EXTRACTION Right 08/31/2014   Dr.Lyles  . colonosocpy    . FINGER SURGERY Left    Left ring finger  . HERNIA REPAIR     at least 64yrs ago  . HIP PINNING,CANNULATED Left 05/05/2013   Procedure: CANNULATED HIP PINNING- left;  Surgeon: Renette Butters, MD;  Location: [facility];  Service: Orthopedics;  Laterality: Left;  . INGUINAL HERNIA REPAIR  09/05/2012   Procedure: LAPAROSCOPIC INGUINAL HERNIA;  Surgeon: Gayland Curry, MD,FACS;  Location: [facility];  Service: General;  Laterality: Left;  . INSERTION OF MESH  09/05/2012   Procedure: INSERTION OF MESH;  Surgeon: Gayland Curry, MD,FACS;  Location: [facility];  Service: General;  Laterality: Left;  . left leg surgery  2011  . NERVE BIOPSY    . TRANSURETHRAL RESECTION OF PROSTATE N/A 09/07/2013   Procedure: CYSTO TRANSURETHRAL RESECTION OF THE PROSTATE (TURP);  Surgeon: Reece Packer, MD;  Location: [facility];  Service: Urology;  Laterality: N/A;   Social History:   reports that he quit smoking about 32 years ago. His smoking use included cigarettes. He quit smokeless tobacco use about 17 years ago. He reports that he does not drink alcohol or use drugs.  Family History  Problem Relation Age of Onset  . Diabetes Mother        type 2  . Clotting disorder Mother        deceased, blood clot  . Leukemia Father   . COPD Brother   . Lung cancer Sister   . Heart disease Brother        1/2 brother    Medications: Patient's Medications  New Prescriptions   No medications on file  Previous Medications   ACETAMINOPHEN (TYLENOL) 500 MG TABLET    Take 500-1,000 mg by mouth every 6 (six) hours as needed.   ALLOPURINOL (ZYLOPRIM) 100 MG TABLET    TAKE 1 TABLET BY MOUTH TWICE DAILY TO PREVENT GOUT.   B COMPLEX VITAMINS CAPSULE    Take 1 capsule by mouth daily.     CHOLECALCIFEROL (VITAMIN D3) 2000 UNITS TABS    Take by mouth daily.   CLOPIDOGREL (PLAVIX) 75 MG TABLET    TAKE 1 TABLET BY MOUTH ONCE DAILY FOR  ANTICOAGULATION   FINASTERIDE (PROSCAR) 5 MG TABLET    TAKE 1 TABLET BY MOUTH ONCE DAILY TO  HELP  SHRINK  PROSTATE   FUROSEMIDE (LASIX) 20 MG TABLET    Take 1 tablet (20 mg total) by mouth daily. daily x 3 days and then daily prn swelling   METOPROLOL TARTRATE (LOPRESSOR) 25 MG TABLET  Take 1 tablet (25 mg total) by mouth 2 (two) times daily.   MULTIPLE VITAMINS-MINERALS (PRESERVISION AREDS) CAPS    Take 1 capsule by mouth daily.   OVER THE COUNTER MEDICATION    Take 1 capsule by mouth daily. VISION ALIGN   PRIMIDONE (MYSOLINE) 50 MG TABLET    TAKE 1 TABLET BY MOUTH ONCE DAILY IN THE EVENING TO HELP WITH TREMORS.  Modified Medications   No medications on file  Discontinued Medications   DOXYCYCLINE (VIBRA-TABS) 100 MG TABLET    Take 1 tablet (100 mg total) by mouth 2 (two) times daily.     Physical Exam:  Vitals:   03/26/18 0910  BP: 132/80  Pulse: 77  Temp: 97.9 F (36.6 C)  TempSrc: Oral  SpO2: 96%  Weight: 165 lb (74.8 kg)  Height: 5\' 8"  (1.727 m)   Body mass index is 25.09 kg/m.  Physical Exam  Constitutional: He is oriented to person, place, and time. He appears well-developed and well-nourished.  Thin, frail   HENT:  Head: Normocephalic and atraumatic.  Right Ear: External ear normal.  Nose: Nose normal.  Mouth/Throat: Oropharynx is clear and moist.  Eyes: Pupils are equal, round, and reactive to light. Left eye exhibits no chemosis and no exudate. No foreign body present in the left eye.  Cardiovascular: Normal rate, regular rhythm and normal heart sounds.  Pulmonary/Chest: Effort normal and breath sounds normal.  Abdominal: Soft. Bowel sounds are normal.  Genitourinary: Rectum normal. Rectal exam shows guaiac negative stool.  Musculoskeletal: He exhibits no edema or tenderness.  Unstable gait. Using walker.Muscular  wasting of hand muscles and forearm muscles and lower leg muscles. Contractures of all fingers in both hands.  Neurological: He is alert and oriented to person, place, and time. No cranial nerve deficit. Coordination abnormal.  Bilateral foot drop. Tremor. Generalized weakness based on neuromuscular disease.  Skin: Skin is warm and dry.  Psychiatric: He has a normal mood and affect.    Labs reviewed: Basic Metabolic Panel: Recent Labs    12/31/17 1219 02/26/18 1215 03/05/18 0924  NA 140 139 141  K 5.1 4.9 4.6  CL 103 105 107  CO2 28 28 28   GLUCOSE 84 96 83  BUN 40* 41* 46*  CREATININE 2.00* 2.18* 2.00*  CALCIUM 9.0 9.0 9.1   Liver Function Tests: Recent Labs    06/12/17 1448 12/31/17 1219 02/26/18 1215  AST 15 19 24   ALT 12 19 30   BILITOT 0.3 0.5 0.5  PROT 6.3 6.5 6.4   No results for input(s): LIPASE, AMYLASE in the last 8760 hours. No results for input(s): AMMONIA in the last 8760 hours. CBC: Recent Labs    06/12/17 1448 12/31/17 1219  WBC 7.2 6.9  NEUTROABS 3,629 3,933  HGB 13.2 13.9  HCT 39.1 40.2  MCV 95.8 95.0  PLT 161 163   Lipid Panel: No results for input(s): CHOL, HDL, LDLCALC, TRIG, CHOLHDL, LDLDIRECT in the last 8760 hours. TSH: No results for input(s): TSH in the last 8760 hours. A1C: No results found for: HGBA1C   Assessment/Plan 1. Essential hypertension Has improved with lopressor. Will continue lopressor 25 mg by mouth twice daily with dietary modifications - metoprolol tartrate (LOPRESSOR) 25 MG tablet; Take 1 tablet (25 mg total) by mouth 2 (two) times daily.  Dispense: 180 tablet; Refill: 3  2. Cough Improving   3. Bilateral lower extremity edema -encouraged compression hose, elevation, and low sodium diet. -to use lasix as needed only for increase swelling.  -  furosemide (LASIX) 20 MG tablet; Take 1 tablet (20 mg total) by mouth daily prn swelling  Dispense: 15 tablet; Refill: 0  4. CKD (chronic kidney disease) stage 4, GFR  15-29 ml/min (HCC) Limiting lasix use due to CKD which has been progressive.   5. Shortness of breath -with increase in activity, most likely due to debility however pt possible COPD seen on xray, pulmonary function test has been ordered for further evaluation.   Next appt: 06/27/2018 Drew Gibson. [place], [facility] [phone]

## 2018-03-27 ENCOUNTER — Emergency Department (HOSPITAL_COMMUNITY): Payer: Medicare Other

## 2018-03-27 ENCOUNTER — Encounter (HOSPITAL_COMMUNITY): Payer: Self-pay | Admitting: Internal Medicine

## 2018-03-27 ENCOUNTER — Other Ambulatory Visit: Payer: Self-pay

## 2018-03-27 ENCOUNTER — Observation Stay (HOSPITAL_COMMUNITY)
Admission: EM | Admit: 2018-03-27 | Discharge: 2018-03-30 | Disposition: A | Discharge status: Skilled Nursing Facility | Payer: Medicare Other | Attending: Student in an Organized Health Care Education/Training Program | Admitting: Student in an Organized Health Care Education/Training Program

## 2018-03-27 ENCOUNTER — Telehealth: Payer: Self-pay

## 2018-03-27 DIAGNOSIS — N184 Chronic kidney disease, stage 4 (severe): Secondary | ICD-10-CM | POA: Insufficient documentation

## 2018-03-27 DIAGNOSIS — I4430 Unspecified atrioventricular block: Secondary | ICD-10-CM | POA: Diagnosis not present

## 2018-03-27 DIAGNOSIS — I130 Hypertensive heart and chronic kidney disease with heart failure and stage 1 through stage 4 chronic kidney disease, or unspecified chronic kidney disease: Secondary | ICD-10-CM | POA: Insufficient documentation

## 2018-03-27 DIAGNOSIS — J90 Pleural effusion, not elsewhere classified: Secondary | ICD-10-CM | POA: Diagnosis not present

## 2018-03-27 DIAGNOSIS — N179 Acute kidney failure, unspecified: Secondary | ICD-10-CM | POA: Diagnosis not present

## 2018-03-27 DIAGNOSIS — N400 Enlarged prostate without lower urinary tract symptoms: Secondary | ICD-10-CM | POA: Diagnosis not present

## 2018-03-27 DIAGNOSIS — M109 Gout, unspecified: Secondary | ICD-10-CM | POA: Insufficient documentation

## 2018-03-27 DIAGNOSIS — J189 Pneumonia, unspecified organism: Secondary | ICD-10-CM | POA: Diagnosis not present

## 2018-03-27 DIAGNOSIS — R55 Syncope and collapse: Secondary | ICD-10-CM | POA: Diagnosis not present

## 2018-03-27 DIAGNOSIS — G629 Polyneuropathy, unspecified: Secondary | ICD-10-CM | POA: Insufficient documentation

## 2018-03-27 DIAGNOSIS — Y9301 Activity, walking, marching and hiking: Secondary | ICD-10-CM | POA: Diagnosis not present

## 2018-03-27 DIAGNOSIS — S32591A Other specified fracture of right pubis, initial encounter for closed fracture: Secondary | ICD-10-CM | POA: Insufficient documentation

## 2018-03-27 DIAGNOSIS — R2681 Unsteadiness on feet: Secondary | ICD-10-CM | POA: Diagnosis not present

## 2018-03-27 DIAGNOSIS — Z886 Allergy status to analgesic agent status: Secondary | ICD-10-CM | POA: Diagnosis not present

## 2018-03-27 DIAGNOSIS — I5033 Acute on chronic diastolic (congestive) heart failure: Secondary | ICD-10-CM | POA: Diagnosis not present

## 2018-03-27 DIAGNOSIS — Z79899 Other long term (current) drug therapy: Secondary | ICD-10-CM | POA: Insufficient documentation

## 2018-03-27 DIAGNOSIS — Y92002 Bathroom of unspecified non-institutional (private) residence single-family (private) house as the place of occurrence of the external cause: Secondary | ICD-10-CM | POA: Diagnosis not present

## 2018-03-27 DIAGNOSIS — W1830XA Fall on same level, unspecified, initial encounter: Secondary | ICD-10-CM | POA: Insufficient documentation

## 2018-03-27 DIAGNOSIS — I499 Cardiac arrhythmia, unspecified: Secondary | ICD-10-CM | POA: Diagnosis not present

## 2018-03-27 DIAGNOSIS — R079 Chest pain, unspecified: Secondary | ICD-10-CM | POA: Diagnosis not present

## 2018-03-27 DIAGNOSIS — I440 Atrioventricular block, first degree: Secondary | ICD-10-CM | POA: Diagnosis not present

## 2018-03-27 LAB — BASIC METABOLIC PANEL
Anion gap: 11 (ref 5–15)
BUN: 48 mg/dL — ABNORMAL HIGH (ref 8–23)
CALCIUM: 8.8 mg/dL — AB (ref 8.9–10.3)
CHLORIDE: 105 mmol/L (ref 98–111)
CO2: 24 mmol/L (ref 22–32)
CREATININE: 2.46 mg/dL — AB (ref 0.61–1.24)
GFR calc Af Amer: 26 mL/min — ABNORMAL LOW (ref >60)
GFR calc non Af Amer: 22 mL/min — ABNORMAL LOW (ref >60)
GLUCOSE: 158 mg/dL — AB (ref 70–99)
Potassium: 4.4 mmol/L (ref 3.5–5.1)
Sodium: 140 mmol/L (ref 135–145)

## 2018-03-27 LAB — CBC
HCT: 42.1 % (ref 39.0–52.0)
HEMOGLOBIN: 12.9 g/dL — AB (ref 13.0–17.0)
MCH: 32.2 pg (ref 26.0–34.0)
MCHC: 30.6 g/dL (ref 30.0–36.0)
MCV: 105 fL — AB (ref 78.0–100.0)
PLATELETS: 113 10*3/uL — AB (ref 150–400)
RBC: 4.01 MIL/uL — ABNORMAL LOW (ref 4.22–5.81)
RDW: 15.7 % — ABNORMAL HIGH (ref 11.5–15.5)
WBC: 7.5 10*3/uL (ref 4.0–10.5)

## 2018-03-27 MED ORDER — POLYETHYLENE GLYCOL 3350 17 G PO PACK
17.0000 g | PACK | Freq: Every day | ORAL | Status: DC
  Administered 2018-03-27 – 2018-03-29 (×3): 17 g via ORAL
  Filled 2018-03-27 (×4): qty 1

## 2018-03-27 MED ORDER — ACETAMINOPHEN 325 MG PO TABS
650.0000 mg | ORAL_TABLET | Freq: Four times a day (QID) | ORAL | Status: DC | PRN
  Administered 2018-03-28 – 2018-03-30 (×3): 650 mg via ORAL
  Filled 2018-03-27 (×3): qty 2

## 2018-03-27 MED ORDER — CLOPIDOGREL BISULFATE 75 MG PO TABS
75.0000 mg | ORAL_TABLET | Freq: Every day | ORAL | Status: DC
  Administered 2018-03-28 – 2018-03-30 (×3): 75 mg via ORAL
  Filled 2018-03-27 (×3): qty 1

## 2018-03-27 MED ORDER — SODIUM CHLORIDE 0.9 % IV BOLUS: 500.0000 mL | Freq: Once | INTRAVENOUS | Status: DC

## 2018-03-27 MED ORDER — PRIMIDONE 50 MG PO TABS
50.0000 mg | ORAL_TABLET | Freq: Every day | ORAL | Status: DC
  Administered 2018-03-27 – 2018-03-29 (×3): 50 mg via ORAL
  Filled 2018-03-27 (×3): qty 1

## 2018-03-27 MED ORDER — ONDANSETRON HCL 4 MG/2ML IJ SOLN: 4.0000 mg | Freq: Once | INTRAMUSCULAR | Status: DC

## 2018-03-27 MED ORDER — ACETAMINOPHEN 650 MG RE SUPP: 650.0000 mg | Freq: Four times a day (QID) | RECTAL | Status: DC | PRN

## 2018-03-27 MED ORDER — HEPARIN SODIUM (PORCINE) 5000 UNIT/ML IJ SOLN
5000.0000 [IU] | Freq: Three times a day (TID) | INTRAMUSCULAR | Status: DC
  Administered 2018-03-27 – 2018-03-30 (×8): 5000 [IU] via SUBCUTANEOUS
  Filled 2018-03-27 (×7): qty 1

## 2018-03-27 MED ORDER — METOPROLOL TARTRATE 25 MG PO TABS
25.0000 mg | ORAL_TABLET | Freq: Two times a day (BID) | ORAL | Status: DC
  Administered 2018-03-28 – 2018-03-30 (×5): 25 mg via ORAL
  Filled 2018-03-27 (×5): qty 1

## 2018-03-27 MED ORDER — FINASTERIDE 5 MG PO TABS
5.0000 mg | ORAL_TABLET | Freq: Every day | ORAL | Status: DC
  Administered 2018-03-28 – 2018-03-30 (×3): 5 mg via ORAL
  Filled 2018-03-27 (×4): qty 1

## 2018-03-27 MED ORDER — OXYCODONE HCL 5 MG PO TABS: 5.0000 mg | ORAL_TABLET | ORAL | Status: DC | PRN

## 2018-03-27 MED ORDER — B COMPLEX-C PO TABS
1.0000 | ORAL_TABLET | Freq: Every day | ORAL | Status: DC
  Administered 2018-03-28 – 2018-03-30 (×3): 1 via ORAL
  Filled 2018-03-27 (×3): qty 1

## 2018-03-27 MED ORDER — MORPHINE SULFATE (PF) 4 MG/ML IV SOLN: 4.0000 mg | INTRAVENOUS | Status: DC | PRN

## 2018-03-27 MED ORDER — TRAMADOL HCL 50 MG PO TABS
50.0000 mg | ORAL_TABLET | Freq: Once | ORAL | Status: AC
  Administered 2018-03-27: 50 mg via ORAL
  Filled 2018-03-27: qty 1

## 2018-03-27 MED ORDER — ALLOPURINOL 100 MG PO TABS
200.0000 mg | ORAL_TABLET | Freq: Every day | ORAL | Status: DC
  Administered 2018-03-28 – 2018-03-29 (×2): 200 mg via ORAL
  Filled 2018-03-27 (×2): qty 2

## 2018-03-27 NOTE — ED Provider Notes (Signed)
MSE was initiated and I personally evaluated the patient and placed orders (if any) at  2:54 PM on March 27, 2018.  The patient appears stable so that the remainder of the MSE may be completed by another provider.  Discharge yesterday pubic fracture.  Today had difficulty with moving the right leg.  He was on the bathroom and seemed to become poorly responsive.  He got up and was transitioned to a chair and had approximately 5 minutes of loss of consciousness.  Patient denies any pain.  Patient is alert and appropriate at this time.  No respiratory distress.  Heart is regular.  Lungs grossly clear.  No focal neurologic deficit.     Charlesetta Shanks, MD 03/27/18 [phone]

## 2018-03-27 NOTE — ED Triage Notes (Signed)
Patient was discharged yesterday with fx hip. Today after using toilet patient got on wheelchair and then had a witnessed  approx. 5 min LOC.

## 2018-03-27 NOTE — ED Notes (Signed)
Attempted Report 

## 2018-03-27 NOTE — H&P (Signed)
Date: 03/27/2018               Patient Name:  Drew Gibson MRN: [id number]  DOB: 1929/07/10 Age / Sex: 84 y.o., male   PCP: [name], NP         Medical Service: Internal Medicine Teaching Service         Attending Physician: Dr. Evette Doffing, Mallie Mussel, *    First Contact: Dr. Philipp Ovens  Pager: [phone]  Second Contact: Dr. Philipp Ovens Pager: [phone]       After Hours (After 5p/  First Contact Pager: [phone]  weekends / holidays): Second Contact Pager: [phone]   Chief Complaint: LOC  History of Present Illness: Drew Gibson is a 84 y.o male with CKD Stage IV, peripheral neuropathy, and h/o bilateral hip fractures s/p ORIF who presented to the ED after a syncopal episode.   Today the patient was using the restroom when he subsequently loss consciousness for a couple seconds. Per family at bedside the patient had a BM, became flush and diaphoretic before subsequently passing out. He quickly regained consciousness without falling and returned to baseline mental capacity. He then stood up and used his walker to get out of the bathroom and got ready to sit down in his wheelchair when he gain lost consciousness. This episode last 4-5 minutes but by the time EMS arrived he was back to baseline. In addition he had one episode last night after he arrived home from the hospital. During these episodes he did not experience shaking/convulsions, tongue biting, loss of bladder or bowel control. He has passed out on several occasion in the past from unknown causes. He denies prodromal symptoms including visual changes, headaches, dizziness, lightheadedness, palpitations, or SOB.   Per chart review the patient was evaluated in the ED after falling in the [employer] bathroom while trying to get off the toilet and injuring his right hip. Further imagining illustrated a right inferior pubic rami fracture. His pain was treated and he was able to ambulate (limited mobility per chart, only able to  take 2 steps before needing to stop) so he was discharged home.   In addition to the information above, the patient has been having 2-3 weeks of SOB and is suspected to have COPD. He was supposed to have PFTs today but was unable to make it to his appt. He also has significant peripheral neuropathy that limits his mobility and strength. He has had this for >30 years and has had nerve biopsies and EMGs that were inconclusive. He does fall a lot due to decreased perception.   Meds: Current Meds  Medication Sig  . acetaminophen (TYLENOL) 500 MG tablet Take 500-1,000 mg by mouth every 6 (six) hours as needed.  Marland Kitchen allopurinol (ZYLOPRIM) 100 MG tablet TAKE 1 TABLET BY MOUTH TWICE DAILY TO PREVENT GOUT. (Patient taking differently: TAKE 2 TABLET BY MOUTH NIGHTLY TO PREVENT GOUT.)  . b complex vitamins capsule Take 1 capsule by mouth daily.   . Cholecalciferol (VITAMIN D3) 2000 UNITS TABS Take 2,000 Units by mouth daily.   . clopidogrel (PLAVIX) 75 MG tablet TAKE 1 TABLET BY MOUTH ONCE DAILY FOR  ANTICOAGULATION  . finasteride (PROSCAR) 5 MG tablet TAKE 1 TABLET BY MOUTH ONCE DAILY TO  HELP  SHRINK  PROSTATE  . furosemide (LASIX) 20 MG tablet Take 1 tablet (20 mg total) by mouth daily as needed for edema.  . metoprolol tartrate (LOPRESSOR) 25 MG tablet Take 1 tablet (25 mg total)  by mouth 2 (two) times daily.  . Multiple Vitamins-Minerals (PRESERVISION AREDS) CAPS Take 1 capsule by mouth daily.  Marland Kitchen OVER THE COUNTER MEDICATION Take 1 capsule by mouth at bedtime. VISION ALIGN   . primidone (MYSOLINE) 50 MG tablet TAKE 1 TABLET BY MOUTH ONCE DAILY IN THE EVENING TO HELP WITH TREMORS.  Marland Kitchen traMADol (ULTRAM) 50 MG tablet Take 1 tablet (50 mg total) by mouth every 6 (six) hours as needed.   Allergies: Allergies as of 03/27/2018 - Review Complete 03/27/2018  Allergen Reaction Noted  . Aleve [naproxen sodium] Other (See Comments) 12/04/2011  . Antihistamines, diphenhydramine-type Other (See Comments) 04/28/2014    . Nsaids  12/03/2012   Past Medical History:  Diagnosis Date  . Abnormality of gait   . Benign neoplasm of colon   . Chronic kidney disease, stage III (moderate) (HCC)   . Complication of anesthesia    postop headache after general anesthesia  . Degenerative arthritis   . Diverticulosis of colon (without mention of hemorrhage)   . Duodenal ulcer, unspecified as acute or chronic, without hemorrhage, perforation, or obstruction   . Encounter for long-term (current) use of other medications   . Enlarged prostate    takes Tamsulosin daily  . Essential and other specified forms of tremor   . Foley catheter in place 09-01-13   4 months now  . Foot drop, bilateral   . Gait disorder    balance issues-uses walker or mobile chair.  . Gout    takes Allopurinol daily  . Gout, unspecified   . Hyperlipidemia    but doesn't require meds  . Hypertension    takes Enalapril daily   . Inguinal hernia without mention of obstruction or gangrene, unilateral or unspecified, (not specified as recurrent)   . Internal hemorrhoids without mention of complication   . Macular degeneration    takes eye cap vits daily  . Other abnormal blood chemistry   . Peripheral neuropathy   . Peripheral neuropathy   . Pneumonia    hx of;last time 8'14-mild.  . Ptosis    Left side  . Spinal stenosis, lumbar region, with neurogenic claudication   . TIA (transient ischemic attack)    sees Dr.Willis-neurologist 6'14(strange feeling of head)-no residual  . Unspecified vitamin D deficiency   . Urinary frequency   . Urinary urgency   . UTI (lower urinary tract infection) 09-01-13   tx. 2 weeks ago   Family History:  Mother: + DM, blood clots  Father: + Leukemia Sister: + Lung cancer  Brother: COPD, CAD  Social History: [place]  Former smoker Denies use of EtOH and illicit substances   Review of Systems: A complete ROS was negative except as per HPI.   Physical Exam: Blood pressure (!)  157/98, pulse 71, temperature 97.7 F (36.5 C), resp. rate 17, height 5\' 8"  (1.727 m), weight 165 lb (74.8 kg), SpO2 93 %.  General: Thin, elderly male in no acute distress HENT: Normocephalic, atraumatic, moist mucus membranes, no JVD Pulm: Good air movement with no wheezing or crackles  CV: RRR, no murmurs, no rubs  Abdomen: Active bowel sounds, soft, non-distended, no tenderness to palpation  Extremities: Cool LEs bilaterally, mild pitting edema bilaterally. Posterior tibial and dorsalis pedis +2 on the left and very diminished on the right  Skin: Warm and dry  Neuro: Alert and oriented x 3, cranial nerves intact bilaterally, bilateral foot drop and decreased sensation to touch of the bilateral LEs  most prominent on the feet, assessment for strength not completed due to pain, significant atropthy of the hands and distal forearms bilaterally, grip strength 2/5 bilaterally, UE push and pull strength 4/5 bilaterally, ?fasciculations of the bilateral UE most prominent on the forearms.   EKG: personally reviewed: my interpretation is sinus rhythm with normal axis. 1st degree AV block (appears to be new). No ST elevation or new TWI.   CXR: personally reviewed: my interpretation is rotated film with good penetration. No bony or soft tissue abnormalities. Elevation of the left hemidiaphragm, but again film is rotated. No cardiomegaly. No pleural effusions. Appears to be increased pulmonary congestion vs reticular pattern in pulmonary parenchyma.   CT Head w/out Contrast  No acute finding. Atrophy and chronic small-vessel ischemic changes, progressive since 2011.  Assessment & Plan by Problem: Active Problems:   Syncope  Drew Gibson is a 84 y.o male with CKD Stage IV, peripheral neuropathy, and h/o bilateral hip fractures s/p ORIF who presented to the ED after 3 separate syncopal episode.   Syncope. Patient presented after 3 syncopal episodes. In each setting he was sitting and lacked prodromal  symptoms. Reflex syncope (vasovagal vs situational) seems the most likely given the patient's presentation but the lack of prodromal symptoms do make this higher risk. His symptoms are not suggestive of seizure (although, ictal syncope does remain on the differential) or CVA as the culprit. Cardiac etiology is on the differential, primarily arrhythmias as the PE is not suggestive of structural cardiac defects including AS. Orthostatic hypotension is also on the differential but much less likely given his symptoms do not occur when going from the seated to standing position; however his severe peripheral neuropathy and use of a beta blocker are risk factors.  - Admit to telemetry  - Repeat EKG in the AM  - Echocardiogram   Nondisplaced Right Inferior Pubic Rami. Patient found to have a nondisplaced fracture after falling on 6/26 while trying to stand up after using the bathroom.  - Pain control with Oxy IR - PT/OT evaluation, may need SNF placement   Severe Peripheral Neuropathy. Patient has had peripheral neuropathy for >30 years. Indicates that he has had nerve biopsies and EMGs in the past that have been inconclusive. Unfortunately we do not have these records. PE indicative of LMN process. In on B vitamins at home, but has megaloblastic anemia so we will check a B12. Other possibility is a plasma cell dyscrasia (amylodosis, MM), however previous protein gaps were <4.0.  - Check B12 and TSH - CMP in AM to assess for protein gap  - Further outpatient work-up could include HIV, hepatitis panel, heavy metals. Also possible that this is secondary to exposure from growing [place] a tobacco [place] (?organophosphates)  PAD. PE with diminished peripheral pulses and cool LEs. ABIs in 03/2017 illustrating significant PAD in the bilateral LEs. Has never seen vascular surgery.  - Continue plavix and ASA  CKD Stage IV - Renal function stable compared to prior - Avoiding nephrotoxic dugs   HTN - Continue  metoprolol   Megaloblastic Anemia  - Check TSH and B12  Diet: Regular VTE ppx: SubQ Heparin CODE STATUS: DNR  Dispo: Admit patient to Inpatient with expected length of stay greater than 2 midnights.  Signed: Ina Homes, MD 03/27/2018, 8:55 PM  My Pager: [phone]

## 2018-03-27 NOTE — Telephone Encounter (Signed)
Yes but to use extreme caution as alprazolam can increase risk of fall

## 2018-03-27 NOTE — Telephone Encounter (Signed)
I spoke with Drew Gibson and she verbalized understanding. She did not have any further concerns at this time.

## 2018-03-27 NOTE — ED Notes (Signed)
Admitting MDs remain at bedside

## 2018-03-27 NOTE — Telephone Encounter (Signed)
Patient's wife would like to know if it is ok to give patient 1/4 of a table of 0.5 mg alprazolam due to increased anxiety and fear of falling. The xanax is an old prescription that was written for patient's wife.

## 2018-03-27 NOTE — ED Notes (Signed)
Admitting Docs @ Bedside

## 2018-03-28 ENCOUNTER — Inpatient Hospital Stay (HOSPITAL_BASED_OUTPATIENT_CLINIC_OR_DEPARTMENT_OTHER): Payer: Medicare Other

## 2018-03-28 DIAGNOSIS — R55 Syncope and collapse: Secondary | ICD-10-CM

## 2018-03-28 DIAGNOSIS — R0602 Shortness of breath: Secondary | ICD-10-CM

## 2018-03-28 DIAGNOSIS — W1812XD Fall from or off toilet with subsequent striking against object, subsequent encounter: Secondary | ICD-10-CM

## 2018-03-28 DIAGNOSIS — S32591D Other specified fracture of right pubis, subsequent encounter for fracture with routine healing: Secondary | ICD-10-CM

## 2018-03-28 DIAGNOSIS — N184 Chronic kidney disease, stage 4 (severe): Secondary | ICD-10-CM | POA: Diagnosis not present

## 2018-03-28 DIAGNOSIS — Z79891 Long term (current) use of opiate analgesic: Secondary | ICD-10-CM

## 2018-03-28 DIAGNOSIS — Z79899 Other long term (current) drug therapy: Secondary | ICD-10-CM

## 2018-03-28 DIAGNOSIS — I129 Hypertensive chronic kidney disease with stage 1 through stage 4 chronic kidney disease, or unspecified chronic kidney disease: Secondary | ICD-10-CM | POA: Diagnosis not present

## 2018-03-28 DIAGNOSIS — I350 Nonrheumatic aortic (valve) stenosis: Secondary | ICD-10-CM

## 2018-03-28 DIAGNOSIS — G629 Polyneuropathy, unspecified: Secondary | ICD-10-CM

## 2018-03-28 DIAGNOSIS — I440 Atrioventricular block, first degree: Secondary | ICD-10-CM

## 2018-03-28 DIAGNOSIS — Z87891 Personal history of nicotine dependence: Secondary | ICD-10-CM

## 2018-03-28 DIAGNOSIS — Z9181 History of falling: Secondary | ICD-10-CM

## 2018-03-28 DIAGNOSIS — Z8781 Personal history of (healed) traumatic fracture: Secondary | ICD-10-CM

## 2018-03-28 DIAGNOSIS — Z7902 Long term (current) use of antithrombotics/antiplatelets: Secondary | ICD-10-CM

## 2018-03-28 DIAGNOSIS — D531 Other megaloblastic anemias, not elsewhere classified: Secondary | ICD-10-CM | POA: Diagnosis not present

## 2018-03-28 DIAGNOSIS — Z66 Do not resuscitate: Secondary | ICD-10-CM

## 2018-03-28 DIAGNOSIS — I739 Peripheral vascular disease, unspecified: Secondary | ICD-10-CM

## 2018-03-28 DIAGNOSIS — M21371 Foot drop, right foot: Secondary | ICD-10-CM

## 2018-03-28 DIAGNOSIS — Z7982 Long term (current) use of aspirin: Secondary | ICD-10-CM

## 2018-03-28 DIAGNOSIS — I10 Essential (primary) hypertension: Secondary | ICD-10-CM

## 2018-03-28 DIAGNOSIS — M21372 Foot drop, left foot: Secondary | ICD-10-CM

## 2018-03-28 DIAGNOSIS — Z886 Allergy status to analgesic agent status: Secondary | ICD-10-CM

## 2018-03-28 DIAGNOSIS — Z888 Allergy status to other drugs, medicaments and biological substances status: Secondary | ICD-10-CM

## 2018-03-28 LAB — ECHOCARDIOGRAM COMPLETE
Height: 68 in
Weight: 2754.87 oz

## 2018-03-28 LAB — URINALYSIS, ROUTINE W REFLEX MICROSCOPIC
Bilirubin Urine: NEGATIVE
Glucose, UA: NEGATIVE mg/dL
Hgb urine dipstick: NEGATIVE
Ketones, ur: NEGATIVE mg/dL
Leukocytes, UA: NEGATIVE
NITRITE: NEGATIVE
PH: 5 (ref 5.0–8.0)
SPECIFIC GRAVITY, URINE: 1.013 (ref 1.005–1.030)

## 2018-03-28 LAB — CBC
HEMATOCRIT: 40.7 % (ref 39.0–52.0)
HEMOGLOBIN: 12.8 g/dL — AB (ref 13.0–17.0)
MCH: 32.5 pg (ref 26.0–34.0)
MCHC: 31.4 g/dL (ref 30.0–36.0)
MCV: 103.3 fL — ABNORMAL HIGH (ref 78.0–100.0)
Platelets: 112 10*3/uL — ABNORMAL LOW (ref 150–400)
RBC: 3.94 MIL/uL — ABNORMAL LOW (ref 4.22–5.81)
RDW: 15.6 % — AB (ref 11.5–15.5)
WBC: 6.7 10*3/uL (ref 4.0–10.5)

## 2018-03-28 LAB — BASIC METABOLIC PANEL
ANION GAP: 7 (ref 5–15)
BUN: 44 mg/dL — AB (ref 8–23)
CALCIUM: 8.9 mg/dL (ref 8.9–10.3)
CO2: 26 mmol/L (ref 22–32)
Chloride: 107 mmol/L (ref 98–111)
Creatinine, Ser: 2.14 mg/dL — ABNORMAL HIGH (ref 0.61–1.24)
GFR calc Af Amer: 30 mL/min — ABNORMAL LOW (ref >60)
GFR, EST NON AFRICAN AMERICAN: 26 mL/min — AB (ref >60)
GLUCOSE: 101 mg/dL — AB (ref 70–99)
POTASSIUM: 4.6 mmol/L (ref 3.5–5.1)
Sodium: 140 mmol/L (ref 135–145)

## 2018-03-28 LAB — TSH: TSH: 2.13 u[IU]/mL (ref 0.350–4.500)

## 2018-03-28 LAB — VITAMIN B12: Vitamin B-12: 802 pg/mL (ref 180–914)

## 2018-03-28 NOTE — Evaluation (Signed)
Occupational Therapy Evaluation Patient Details Name: Drew Gibson MRN: [id number] DOB: 12/10/1928 Today's Date: 03/28/2018    History of Present Illness pt is a 84 y/o male with PMH of PAD, CKD, falls, and HTN admitted with syncope and collapse, also with dx of pubic fx 1 day PTA from a fall   Clinical Impression   PTA Pt mod I with RW/power wc and tub bench with wife putting on socks and shoes for him. Pt is currently max A for LB ADL and mod A for SPT with RW. Pt requires seated position for grooming tasks. PT will benefit from skilled OT in the acute setting as well as CIR level therapy post-acute to maximize safety and independence. Next session please bring theraband for BUE HEP (please establish and set goal) as well as handout for visual impairment as Pt has macular degeneration and has a hard time seeing.     Follow Up Recommendations  CIR;Supervision/Assistance - 24 hour    Equipment Recommendations  Other (comment)(defer to next venue)    Recommendations for Other Services       Precautions / Restrictions Precautions Precautions: Fall Restrictions Weight Bearing Restrictions: No      Mobility Bed Mobility Overal bed mobility: Needs Assistance Bed Mobility: Supine to Sit;Sit to Supine     Supine to sit: HOB elevated;Min guard Sit to supine: Mod assist(for BLE back into bed)   General bed mobility comments: use of grab bars to assist  Transfers Overall transfer level: Needs assistance Equipment used: Rolling walker (2 wheeled) Transfers: Sit to/from [facility] Sit to Stand: Mod assist Stand pivot transfers: Mod assist;Min assist       General transfer comment: mod fade to min assist with practice and cues for forward weight shift and hand placement, min A once upright for balance and assist    Balance Overall balance assessment: Needs assistance Sitting-balance support: Bilateral upper extremity supported Sitting balance-Leahy Scale:  Poor     Standing balance support: Bilateral upper extremity supported Standing balance-Leahy Scale: Poor Standing balance comment: dependent on BUE on RW and external support from therapist                           ADL either performed or assessed with clinical judgement   ADL Overall ADL's : Needs assistance/impaired Eating/Feeding: Supervision/ safety;Sitting Eating/Feeding Details (indicate cue type and reason): wears a "bib" at home and has to be careful when scooping his food - has used built up handles in the past and they do not make a difference - trouble from wrist movement not tremoring of arem/hands Grooming: Set up;Sitting;Oral care;Wash/dry face Grooming Details (indicate cue type and reason): unable to maintain standing for ADL at this time, he has decreased activity tolerance and reports sitting for his grooming at baseline Upper Body Bathing: Min guard;Sitting   Lower Body Bathing: Maximal assistance;Sitting/lateral leans Lower Body Bathing Details (indicate cue type and reason): unable to access LB for bathing/dressing - can get top of thighs Upper Body Dressing : Minimal assistance   Lower Body Dressing: Maximal assistance;Sit to/from stand Lower Body Dressing Details (indicate cue type and reason): total A for socks, max A for sit <>stand dressing activities Toilet Transfer: Moderate assistance;Stand-pivot;RW Toilet Transfer Details (indicate cue type and reason): simulated through recliner transfer Toileting- Clothing Manipulation and Hygiene: Maximal assistance;Sit to/from stand       Functional mobility during ADLs: Moderate assistance;Rolling walker(SPT) General ADL Comments: Pt requires  BUE support in standing, also it should be noted that his vision impacts his ability to perform ADL in strange environment     Vision Baseline Vision/History: Macular Degeneration;Wears glasses Wears Glasses: At all times Patient Visual Report: No change from  baseline Vision Assessment?: Vision impaired- to be further tested in functional context     Perception     Praxis      Pertinent Vitals/Pain Pain Assessment: No/denies pain     Hand Dominance Right   Extremity/Trunk Assessment Upper Extremity Assessment Upper Extremity Assessment: Generalized weakness(noted contractures at PIP and DIP - functional for grasp)   Lower Extremity Assessment Lower Extremity Assessment: Defer to PT evaluation   Cervical / Trunk Assessment Cervical / Trunk Assessment: Kyphotic   Communication Communication Communication: No difficulties   Cognition Arousal/Alertness: Awake/alert Behavior During Therapy: WFL for tasks assessed/performed Overall Cognitive Status: Within Functional Limits for tasks assessed                                     General Comments  Pt is hopeful for CIR placement    Exercises     Shoulder Instructions      Home Living Family/patient expects to be discharged to:: Private residence Living Arrangements: Spouse/significant other Available Help at Discharge: Family;Available 24 hours/day Type of Home: House Home Access: Ramped entrance     Home Layout: Two level;Other (Comment)(bath in basement, stair lift to acces) Alternate Level Stairs-Number of Steps: PT has chair lift to access shower/bath   Bathroom Shower/Tub: Tub/shower unit;Curtain   Bathroom Toilet: Handicapped height Bathroom Accessibility: Yes   Home Equipment: Environmental consultant - 2 wheels;Bedside commode;Wheelchair - manual;Grab bars - tub/shower;Tub bench;Hand held shower head;Other (comment)(stair lift)   Additional Comments: bathroom with shower is downstairs      Prior Functioning/Environment Level of Independence: Needs assistance  Gait / Transfers Assistance Needed: supervision ADL's / Homemaking Assistance Needed: gets assist from wife for socks/shoes otherwise setup level            OT Problem List: Decreased  strength;Decreased activity tolerance;Decreased range of motion;Impaired balance (sitting and/or standing);Impaired vision/perception;Impaired sensation      OT Treatment/Interventions: Self-care/ADL training;Therapeutic exercise;Energy conservation;DME and/or AE instruction;Therapeutic activities;Visual/perceptual remediation/compensation;Patient/family education;Balance training    OT Goals(Current goals can be found in the care plan section) Acute Rehab OT Goals Patient Stated Goal: go to CIR, then return home OT Goal Formulation: With patient Time For Goal Achievement: 04/11/18 Potential to Achieve Goals: Good ADL Goals Pt Will Perform Grooming: sitting;with supervision Pt Will Perform Upper Body Bathing: with modified independence;sitting Pt Will Perform Lower Body Bathing: with modified independence;sitting/lateral leans;with adaptive equipment Pt Will Transfer to Toilet: with min guard assist;stand pivot transfer Pt Will Perform Toileting - Clothing Manipulation and hygiene: with min guard assist;sit to/from stand Pt Will Perform Tub/Shower Transfer: with min guard assist;Stand pivot transfer;tub bench;rolling walker Additional ADL Goal #1: Pt will recall 3 ways of increasing safety with visual compensatory strategies during ADL  OT Frequency: Min 2X/week   Barriers to D/C:            Co-evaluation              AM-PAC PT "6 Clicks" Daily Activity     Outcome Measure Help from another person eating meals?: None Help from another person taking care of personal grooming?: A Little Help from another person toileting, which includes using toliet, bedpan, or urinal?: A  Lot Help from another person bathing (including washing, rinsing, drying)?: A Lot Help from another person to put on and taking off regular upper body clothing?: A Little Help from another person to put on and taking off regular lower body clothing?: A Lot 6 Click Score: 16   End of Session Equipment Utilized  During Treatment: Gait belt;Rolling walker Nurse Communication: Mobility status;Other (comment)(wanting to ensure that he is going to get reg. scheduled med)  Activity Tolerance: Patient tolerated treatment well Patient left: in bed;with call bell/phone within reach;with bed alarm set  OT Visit Diagnosis: Unsteadiness on feet (R26.81);Other abnormalities of gait and mobility (R26.89);Repeated falls (R29.6);History of falling (Z91.81);Muscle weakness (generalized) (M62.81);Low vision, both eyes (H54.2)                Time: 6294-7654 OT Time Calculation (min): 30 min Charges:  OT General Charges $OT Visit: 1 Visit OT Evaluation $OT Eval Moderate Complexity: 1 Mod OT Treatments $Self Care/Home Management : 8-22 mins G-Codes:     Hulda Humphrey OTR/L [place] 03/28/2018, 3:10 PM

## 2018-03-28 NOTE — Progress Notes (Signed)
   Subjective: Patient sitting up in chair today, appears well. Reports long standing history of neuropathy resulting in frequent falls. Experienced a mechanical fall on 6/26 resulting in a pubic ramus fracture. Discharged from ED, then experienced a syncopal episode yesterday. Was started on tramadol recently for pain control given his fracture, last took 6 hours prior to syncopal episode.   Objective:  Vital signs in last 24 hours: Vitals:   03/27/18 1826 03/27/18 1831 03/27/18 2054 03/28/18 0620  BP: (!) 143/84  (!) 157/98 139/87  Pulse: 65  71 69  Resp: 16  17 16   Temp: 97.8 F (36.6 C) 97.8 F (36.6 C) 97.7 F (36.5 C) 97.6 F (36.4 C)  TempSrc: Oral Oral  Oral  SpO2: 94%  93% 98%  Weight:   172 lb 2.9 oz (78.1 kg)   Height:   5\' 8"  (1.727 m)    Physical Exam Constitutional: Chronically ill appearing  Cardiovascular: RRR, faint systolic murmur  Pulmonary/Chest: breathing comfortably on RA, clear anteriorly  Extremities: Upper extremities with resting tremor, thenar wasting, and finger contractions. Lower extremities with venous stasis changes. Significant LE weakness, can life somewhat against gravity but unable to maintain  Psychiatric: Normal mood and affect  Assessment/Plan:  Drew Gibson is a 84 y.o male with CKD Stage IV, peripheral neuropathy, and h/o bilateral hip fractures s/p ORIF who presented to the ED after a syncopal episode.   Syncope: Likely related to recent use of tramadol which was new for him. Less likely cardiogenic, however will continue telemetry and obtain echocardiogram to complete work up. Patient and wife are agreeable to SNF placement. He is unable to walk after pubic ramus fracture 2 days ago. -- Caution with sedating meds  -- Fall precautions  -- Continue telemetry  -- Echocardiogram -- PT / OT   Nondisplaced Right Inferior Pubic Rami. Patient found to have a nondisplaced fracture after falling on 6/26 while trying to stand up after using the  bathroom.  -- PT/OT  -- Patient has not required oxycodone; will discontinue   Severe Peripheral Neuropathy. Patient has had peripheral neuropathy for >30 years. Indicates that he has had nerve biopsies and EMGs in the past that have been inconclusive. Unfortunately we do not have these records.  -- B12 and TSH >> within normal limits  -- No further work up  PAD. PE with diminished peripheral pulses and cool LEs. ABIs in 03/2017 illustrating significant PAD in the bilateral LEs. Has never seen vascular surgery.  -- Continue plavix and ASA  CKD Stage IV -- Renal function stable compared to prior -- Avoiding nephrotoxic dugs   HTN -- Continue metoprolol   Diet: Regular VTE ppx: SubQ Heparin CODE STATUS: DNR  Dispo: Anticipated discharge in approximately 1-2 day(s).   Velna Ochs, MD 03/28/2018, 9:55 AM Pager: [phone]

## 2018-03-28 NOTE — Clinical Social Work Note (Signed)
Clinical Social Work Assessment  Patient Details  Name: Drew Gibson MRN: [id number] Date of Birth: [birth year]  Date of referral:  03/28/18               Reason for consult:  Facility Placement                Permission sought to share information with:  Facility Sport and exercise psychologist, Family Supports Permission granted to share information::  Yes, Verbal Permission Granted  Name::     Engineer, manufacturing::  SNFs  Relationship::  Spouse  Contact Information:  [phone]  Housing/Transportation Living arrangements for the past 2 months:  Single Family Home Source of Information:  Patient, Spouse Patient Interpreter Needed:  None Criminal Activity/Legal Involvement Pertinent to Current Situation/Hospitalization:  No - Comment as needed Significant Relationships:  Adult Children, Spouse Lives with:  Spouse Do you feel safe going back to the place where you live?  No Need for family participation in patient care:  No (Coment)  Care giving concerns:  CSW received consult for possible SNF placement at time of discharge. CSW spoke with patient and his wife regarding PT recommendation of SNF placement at time of discharge if CIR is unable to accept patient. Patient reported that patient's spouse is currently unable to care for patient at their home given patient's current physical needs and fall risk. Patient expressed understanding of PT recommendation and is agreeable to SNF placement at time of discharge. CSW to continue to follow and assist with discharge planning needs.   Social Worker assessment / plan:  CSW spoke with patient and spouse concerning possibility of rehab at [facility] before returning home if CIR cannot accept him.  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 CSW explained that due to it being the weekend and patient being under observation status, CIR would likely not be able to admit patient. Patient reported preference for [facility] or [facility] since patient has been there before. CSW will attempt to initiate authorization with insurance before weekend when insurance is closed.   Patient/Family's Understanding of and Emotional Response to Diagnosis, Current Treatment, and Prognosis:  Patient/family is realistic regarding therapy needs and expressed being hopeful for CIR placement. Patient expressed understanding of CSW role and discharge process as well as medical condition. No questions/concerns about plan or treatment.    Emotional Assessment Appearance:  Appears stated age Attitude/Demeanor/Rapport:  Gracious, Charismatic Affect (typically observed):  Accepting, Appropriate Orientation:  Oriented to Self, Oriented to Place, Oriented to  Time, Oriented to Situation Alcohol / Substance use:  Not Applicable Psych involvement (Current and /or in the community):  No (Comment)  Discharge Needs  Concerns to be addressed:  Care Coordination Readmission within the last 30 days:  No Current discharge risk:  None Barriers to Discharge:  Continued Medical Work up   [employer], [place] 03/28/2018, 3:37 PM

## 2018-03-28 NOTE — Progress Notes (Signed)
OT Cancellation Note  Patient Details Name: HANNA AULTMAN MRN: [id number] DOB: 10/28/1928   Cancelled Treatment:    Reason Eval/Treat Not Completed: Patient at procedure or test/ unavailable(Echo)  Merri Ray Deisy Ozbun 03/28/2018, 9:28 AM  Hulda Humphrey OTR/L [phone]

## 2018-03-28 NOTE — Care Management Obs Status (Signed)
[place] NOTIFICATION   Patient Details  Name: [name] MRN: [id number] Date of Birth: [birth year]   Medicare Observation Status Notification Given:  Yes    Sharin Mons, RN 03/28/2018, 11:48 AM

## 2018-03-28 NOTE — Progress Notes (Signed)
Rehab Admissions Coordinator Note:  Patient was screened by Cleatrice Burke for appropriateness for an Inpatient Acute Rehab Consult per PT recommendation. Noted pt is observation status. Patient may not meet medically necessary guidelines for an inpt rehab admit. Recommend home with hh with 24/7 or SNF.  Cleatrice Burke 03/28/2018, 12:40 PM  I can be reached at [phone].

## 2018-03-28 NOTE — ED Provider Notes (Signed)
[name] Provider Note   CSN: [id number] Arrival date & time: 03/27/18  1408     History   Chief Complaint Chief Complaint  Patient presents with  . Loss of Consciousness    HPI Drew Gibson is a 84 y.o. male.  Chief complaint is syncope  HPI wonderfully pleasant 84 year old male from home.  Fell yesterday.  Mechanical fall.  Subtle nondisplaced right inferior pubic ramus fracture.  Was amatory in the ER.  Discharged home.  Slept in his lift chair.  Family got him up today.  With the assistance of 2 and a walker they got him to his bathroom.  Upon getting up from the toilet walking with assist he was having a great deal of pain he had syncopal episode.  His family helped him into his wheelchair.  He feels better now.  Still has some significant pain in his right groin from his fracture  Past Medical History:  Diagnosis Date  . Abnormality of gait   . Benign neoplasm of colon   . Chronic kidney disease, stage III (moderate) (HCC)   . Complication of anesthesia    postop headache after general anesthesia  . Degenerative arthritis   . Diverticulosis of colon (without mention of hemorrhage)   . Duodenal ulcer, unspecified as acute or chronic, without hemorrhage, perforation, or obstruction   . Encounter for long-term (current) use of other medications   . Enlarged prostate    takes Tamsulosin daily  . Essential and other specified forms of tremor   . Foley catheter in place 09-01-13   4 months now  . Foot drop, bilateral   . Gait disorder    balance issues-uses walker or mobile chair.  . Gout    takes Allopurinol daily  . Gout, unspecified   . Hyperlipidemia    but doesn't require meds  . Hypertension    takes Enalapril daily   . Inguinal hernia without mention of obstruction or gangrene, unilateral or unspecified, (not specified as recurrent)   . Internal hemorrhoids without mention of complication   . Macular degeneration    takes eye cap vits  daily  . Other abnormal blood chemistry   . Peripheral neuropathy   . Peripheral neuropathy   . Pneumonia    hx of;last time 8'14-mild.  . Ptosis    Left side  . Spinal stenosis, lumbar region, with neurogenic claudication   . TIA (transient ischemic attack)    sees Dr.Willis-neurologist 6'14(strange feeling of head)-no residual  . Unspecified vitamin D deficiency   . Urinary frequency   . Urinary urgency   . UTI (lower urinary tract infection) 09-01-13   tx. 2 weeks ago    Patient Active Problem List   Diagnosis Date Noted  . Syncope 03/27/2018  . PAD (peripheral artery disease) ([place]) 02/26/2018  . CKD (chronic kidney disease) stage 4, GFR 15-29 ml/min (HCC) 02/26/2018  . Aspiration pneumonia ([place]) 01/23/2017  . Decubitus ulcer of heel 11/14/2016  . Closed right femoral fracture ([place]) 11/14/2016  . Status post hip surgery 08/26/2016  . Wound, open, foot 02/08/2016  . Contracture of finger joint 09/28/2015  . Cough 06/29/2015  . Myalgia and myositis 06/29/2015  . Loose stools 02/16/2015  . History of fall 04/28/2014  . Contracture of joint of hand 04/28/2014  . BPPV (benign paroxysmal positional vertigo) 02/17/2014  . Dysphagia 09/29/2013  . Benign essential tremor 06/15/2013  . Hypertension 06/15/2013  . GERD (gastroesophageal reflux disease) 06/15/2013  .  Closed left femoral fracture ([place]) 05/04/2013  . Hyperlipidemia 05/03/2013  . Polyneuropathy in other diseases classified elsewhere ([place]) 05/03/2013  . Spinal stenosis, lumbar region, with neurogenic claudication 05/03/2013  . Cerebrovascular disease 04/28/2013  . Anisocoria 04/28/2013  . PSA elevation 04/28/2013  . Cerebral infarction ([place]) 03/09/2013  . Unspecified ptosis of eyelid 12/03/2012  . Abnormality of gait 12/03/2012  . CKD (chronic kidney disease), stage III ([place]) 08/06/2011  . BPH (benign prostatic hyperplasia) 08/05/2011  . Gout 08/05/2011    Past Surgical History:  Procedure Laterality Date  .  CATARACT EXTRACTION Right 08/31/2014   Dr.Lyles  . colonosocpy    . FINGER SURGERY Left    Left ring finger  . HERNIA REPAIR     at least 10yrs ago  . HIP PINNING,CANNULATED Left 05/05/2013   Procedure: CANNULATED HIP PINNING- left;  Surgeon: Renette Butters, MD;  Location: [facility];  Service: Orthopedics;  Laterality: Left;  . INGUINAL HERNIA REPAIR  09/05/2012   Procedure: LAPAROSCOPIC INGUINAL HERNIA;  Surgeon: Gayland Curry, MD,FACS;  Location: [facility];  Service: General;  Laterality: Left;  . INSERTION OF MESH  09/05/2012   Procedure: INSERTION OF MESH;  Surgeon: Gayland Curry, MD,FACS;  Location: [facility];  Service: General;  Laterality: Left;  . left leg surgery  2011  . NERVE BIOPSY    . TRANSURETHRAL RESECTION OF PROSTATE N/A 09/07/2013   Procedure: CYSTO TRANSURETHRAL RESECTION OF THE PROSTATE (TURP);  Surgeon: Reece Packer, MD;  Location: [facility];  Service: Urology;  Laterality: N/A;        Home Medications    Prior to Admission medications   Medication Sig Start Date End Date Taking? Authorizing Provider  acetaminophen (TYLENOL) 500 MG tablet Take 500-1,000 mg by mouth every 6 (six) hours as needed.   Yes [provider]  allopurinol (ZYLOPRIM) 100 MG tablet TAKE 1 TABLET BY MOUTH TWICE DAILY TO PREVENT GOUT. Patient taking differently: TAKE 2 TABLET BY MOUTH NIGHTLY TO PREVENT GOUT. 09/13/17  Yes [name], NP  b complex vitamins capsule Take 1 capsule by mouth daily.    Yes [provider]  Cholecalciferol (VITAMIN D3) 2000 UNITS TABS Take 2,000 Units by mouth daily.    Yes [provider]  clopidogrel (PLAVIX) 75 MG tablet TAKE 1 TABLET BY MOUTH ONCE DAILY FOR  ANTICOAGULATION 02/25/18  Yes [name], NP  finasteride (PROSCAR) 5 MG tablet TAKE 1 TABLET BY MOUTH ONCE DAILY TO  HELP  SHRINK  PROSTATE 12/02/17  Yes [name], NP  furosemide (LASIX) 20 MG tablet Take 1 tablet (20 mg total) by mouth daily as needed for edema.  03/26/18  Yes [name], NP  metoprolol tartrate (LOPRESSOR) 25 MG tablet Take 1 tablet (25 mg total) by mouth 2 (two) times daily. 03/26/18  Yes [name], NP  Multiple Vitamins-Minerals (PRESERVISION AREDS) CAPS Take 1 capsule by mouth daily.   Yes [provider]  OVER THE COUNTER MEDICATION Take 1 capsule by mouth at bedtime. VISION ALIGN    Yes [provider]  primidone (MYSOLINE) 50 MG tablet TAKE 1 TABLET BY MOUTH ONCE DAILY IN THE EVENING TO HELP WITH TREMORS. 09/13/17  Yes [name], NP  traMADol (ULTRAM) 50 MG tablet Take 1 tablet (50 mg total) by mouth every 6 (six) hours as needed. 03/26/18  Yes Lajean Saver, MD    Family History Family History  Problem Relation Age of Onset  . Diabetes  Mother        type 2  . Clotting disorder Mother        deceased, blood clot  . Leukemia Father   . COPD Brother   . Lung cancer Sister   . Heart disease Brother        1/2 brother    Social History Social History   Tobacco Use  . Smoking status: Former Smoker    Types: Cigarettes    Last attempt to quit: 05/10/1985    Years since quitting: 32.9  . Smokeless tobacco: Former Systems developer    Quit date: 02/07/2001  . Tobacco comment: quit 30 years ago  Substance Use Topics  . Alcohol use: No    Alcohol/week: 0.0 oz  . Drug use: No     Allergies   Aleve [naproxen sodium]; Antihistamines, diphenhydramine-type; and Nsaids   Review of Systems Review of Systems  Constitutional: Negative for appetite change, chills, diaphoresis, fatigue and fever.  HENT: Negative for mouth sores, sore throat and trouble swallowing.   Eyes: Negative for visual disturbance.  Respiratory: Negative for cough, chest tightness, shortness of breath and wheezing.   Cardiovascular: Negative for chest pain.  Gastrointestinal: Negative for abdominal distention, abdominal pain, diarrhea, nausea and vomiting.  Endocrine: Negative for polydipsia, polyphagia and polyuria.    Genitourinary: Negative for dysuria, frequency and hematuria.       Pelvic pain.  Musculoskeletal: Negative for gait problem.  Skin: Negative for color change, pallor and rash.  Neurological: Positive for syncope. Negative for dizziness, light-headedness and headaches.  Hematological: Does not bruise/bleed easily.  Psychiatric/Behavioral: Negative for behavioral problems and confusion.     Physical Exam Updated Vital Signs BP (!) 157/98 (BP Location: Left Arm)   Pulse 71   Temp 97.7 F (36.5 C)   Resp 17   Ht 5\' 8"  (1.727 m)   Wt 78.1 kg (172 lb 2.9 oz)   SpO2 93%   BMI 26.18 kg/m   Physical Exam  Constitutional: He is oriented to person, place, and time. He appears well-developed and well-nourished. No distress.  Thin frail  HENT:  Head: Normocephalic.  Eyes: Pupils are equal, round, and reactive to light. Conjunctivae are normal. No scleral icterus.  Neck: Normal range of motion. Neck supple. No thyromegaly present.  Cardiovascular: Normal rate and regular rhythm. Exam reveals no gallop and no friction rub.  No murmur heard. Pulmonary/Chest: Effort normal and breath sounds normal. No respiratory distress. He has no wheezes. He has no rales.  Abdominal: Soft. Bowel sounds are normal. He exhibits no distension. There is no tenderness. There is no rebound.  Musculoskeletal: Normal range of motion.  Neurological: He is alert and oriented to person, place, and time.  Skin: Skin is warm and dry. No rash noted.  Psychiatric: He has a normal mood and affect. His behavior is normal.     ED Treatments / Results  Labs (all labs ordered are listed, but only abnormal results are displayed) Labs Reviewed  BASIC METABOLIC PANEL - Abnormal; Notable for the following components:      Result Value   Glucose, Bld 158 (*)    BUN 48 (*)    Creatinine, Ser 2.46 (*)    Calcium 8.8 (*)    GFR calc non Af Amer 22 (*)    GFR calc Af Amer 26 (*)    All other components within normal  limits  CBC - Abnormal; Notable for the following components:   RBC 4.01 (*)  Hemoglobin 12.9 (*)    MCV 105.0 (*)    RDW 15.7 (*)    Platelets 113 (*)    All other components within normal limits  URINALYSIS, ROUTINE W REFLEX MICROSCOPIC  BASIC METABOLIC PANEL  CBC  VITAMIN B12  TSH  CBG MONITORING, ED  I-STAT TROPONIN, ED    EKG EKG Interpretation  Date/Time:  Thursday March 27 2018 14:42:13 EDT Ventricular Rate:  65 PR Interval:  288 QRS Duration: 90 QT Interval:  422 QTC Calculation: 438 R Axis:   88 Text Interpretation:  Sinus rhythm with 1st degree A-V block T wave abnormality, consider inferior ischemia Abnormal ECG Confirmed by Tanna Furry [phone]) on 03/27/2018 3:06:47 PM   Radiology Dg Chest 1 View  Result Date: 03/27/2018 CLINICAL DATA:  Syncope episode. Patient was discharged yesterday with fx hip. Today after using toilet patient got on wheelchair and then had a witnessed approx. 5 min LOC. Denies current chest pain and SOB. Hx of HTN. Former smoker. EXAM: CHEST  1 VIEW COMPARISON:  03/12/2018 FINDINGS: The heart is mildly enlarged. There are no focal consolidations or pleural effusions. No pulmonary edema. No suspicious nodules identified. IMPRESSION: Cardiomegaly. No evidence for acute pulmonary abnormality. Electronically Signed   By: Nolon Nations M.D.   On: 03/27/2018 16:37   Ct Head Wo Contrast  Result Date: 03/27/2018 CLINICAL DATA:  Syncopal episode today. EXAM: CT HEAD WITHOUT CONTRAST TECHNIQUE: Contiguous axial images were obtained from the base of the skull through the vertex without intravenous contrast. COMPARISON:  07/27/2010 FINDINGS: Brain: Generalized brain atrophy. Chronic small-vessel ischemic changes of the cerebral hemispheric white matter. Findings are progressive since 2011. No sign of acute infarction, mass lesion, hemorrhage, hydrocephalus or extra-axial collection. Vascular: There is atherosclerotic calcification of the major vessels at  the base of the brain. Skull: Negative Sinuses/Orbits: Mild chronic inflammatory changes of the maxillary sinuses. Other sinuses clear. Orbits negative. Other: None IMPRESSION: No acute finding. Atrophy and chronic small-vessel ischemic changes, progressive since 2011. Electronically Signed   By: Nelson Chimes M.D.   On: 03/27/2018 16:12   Dg Hip Unilat  With Pelvis 2-3 Views Right  Result Date: 03/26/2018 CLINICAL DATA:  Status post fall [employer] today.  Right hip pain. EXAM: DG HIP (WITH OR WITHOUT PELVIS) 2-3V RIGHT COMPARISON:  None. FINDINGS: There is questioned minimal deformity of the cortex of the right inferior pubic rami, fracture is not excluded. There is no dislocation. Prior fixation of bilateral femur are noted. Marked degenerative joint changes of bilateral hips with narrowed joint space and osteophyte formation are noted. IMPRESSION: Question minimal deformity of the cortex of the right inferior pubic rami, fracture is not excluded. Electronically Signed   By: Abelardo Diesel M.D.   On: 03/26/2018 13:48    Procedures Procedures (including critical care time)  Medications Ordered in ED Medications  sodium chloride 0.9 % bolus 500 mL (has no administration in time range)  allopurinol (ZYLOPRIM) tablet 200 mg (has no administration in time range)  B-complex with vitamin C tablet 1 tablet (has no administration in time range)  clopidogrel (PLAVIX) tablet 75 mg (has no administration in time range)  finasteride (PROSCAR) tablet 5 mg (has no administration in time range)  metoprolol tartrate (LOPRESSOR) tablet 25 mg (has no administration in time range)  primidone (MYSOLINE) tablet 50 mg (50 mg Oral Given 03/27/18 2306)  heparin injection 5,000 Units (5,000 Units Subcutaneous Given 03/27/18 2307)  acetaminophen (TYLENOL) tablet 650 mg (has no administration in time range)  Or  acetaminophen (TYLENOL) suppository 650 mg (has no administration in time range)  oxyCODONE (Oxy  IR/ROXICODONE) immediate release tablet 5 mg (has no administration in time range)  polyethylene glycol (MIRALAX / GLYCOLAX) packet 17 g (17 g Oral Given 03/27/18 2306)  traMADol (ULTRAM) tablet 50 mg (50 mg Oral Given 03/27/18 2000)     Initial Impression / Assessment and Plan / ED Course  I have reviewed the triage vital signs and the nursing notes.  Pertinent labs & imaging results that were available during my care of the patient were reviewed by me and considered in my medical decision making (see chart for details).     EKG Interpretation  Date/Time:  Thursday March 27 2018 14:42:13 EDT Ventricular Rate:  65 PR Interval:  288 QRS Duration: 90 QT Interval:  422 QTC Calculation: 438 R Axis:   88 Text Interpretation:  Sinus rhythm with 1st degree A-V block T wave abnormality, consider inferior ischemia Abnormal ECG Confirmed by Tanna Furry [phone]) on 03/27/2018 3:06:47 PM   I believe his syncopal episode to be vagal mediated from his pain from his pelvic fracture.  However I was unable to obtain physical therapy evaluation.  I feel that with PT evaluation if they will recommend inpatient rehab as patient will require more assist on a regular basis due to his discomfort and underlying frail health   Final Clinical Impressions(s) / ED Diagnoses   Final diagnoses:  Syncope, unspecified syncope type    ED Discharge Orders    None       Tanna Furry, MD 03/28/18 [phone]

## 2018-03-28 NOTE — NC FL2 (Signed)
[place] MEDICAID FL2 LEVEL OF CARE SCREENING TOOL     IDENTIFICATION  Patient Name: Drew Gibson Birthdate: [birth year] Sex: male Admission Date (Current Location): 03/27/2018  [facility] [place] Number:  Herbalist and Address:  [facility] [place], [place] [address] [place], [place] [address]      Provider Number: [id number]  Attending Physician Name and Address:  Axel Filler, *  Relative Name and Phone Number:       Current Level of Care: Hospital Recommended Level of Care: [place] Prior Approval Number:    Date Approved/Denied:   PASRR Number: [id number]  Discharge Plan: SNF    Current Diagnoses: Patient Active Problem List   Diagnosis Date Noted  . Syncope 03/27/2018  . PAD (peripheral artery disease) ([place]) 02/26/2018  . CKD (chronic kidney disease) stage 4, GFR 15-29 ml/min (HCC) 02/26/2018  . Aspiration pneumonia ([place]) 01/23/2017  . Decubitus ulcer of heel 11/14/2016  . Closed right femoral fracture ([place]) 11/14/2016  . Status post hip surgery 08/26/2016  . Wound, open, foot 02/08/2016  . Contracture of finger joint 09/28/2015  . Cough 06/29/2015  . Myalgia and myositis 06/29/2015  . Loose stools 02/16/2015  . History of fall 04/28/2014  . Contracture of joint of hand 04/28/2014  . BPPV (benign paroxysmal positional vertigo) 02/17/2014  . Dysphagia 09/29/2013  . Benign essential tremor 06/15/2013  . Hypertension 06/15/2013  . GERD (gastroesophageal reflux disease) 06/15/2013  . Closed left femoral fracture ([place]) 05/04/2013  . Hyperlipidemia 05/03/2013  . Polyneuropathy in other diseases classified elsewhere ([place]) 05/03/2013  . Spinal stenosis, lumbar region, with neurogenic claudication 05/03/2013  . Cerebrovascular disease 04/28/2013  . Anisocoria 04/28/2013  . PSA elevation 04/28/2013  . Cerebral infarction ([place]) 03/09/2013  . Unspecified ptosis of eyelid 12/03/2012  . Abnormality of gait  12/03/2012  . CKD (chronic kidney disease), stage III ([place]) 08/06/2011  . BPH (benign prostatic hyperplasia) 08/05/2011  . Gout 08/05/2011    Orientation RESPIRATION BLADDER Height & Weight     Self, Time, Situation, Place  Normal Continent Weight: 78.1 kg (172 lb 2.9 oz) Height:  5\' 8"  (172.7 cm)  BEHAVIORAL SYMPTOMS/MOOD NEUROLOGICAL BOWEL NUTRITION STATUS      Continent Diet(Please see DC Summary)  AMBULATORY STATUS COMMUNICATION OF NEEDS Skin   Limited Assist Verbally Normal                       Personal Care Assistance Level of Assistance  Bathing, Feeding, Dressing Bathing Assistance: Limited assistance Feeding assistance: Independent Dressing Assistance: Limited assistance     Functional Limitations Info  Sight, Hearing, Speech Sight Info: Adequate Hearing Info: Adequate Speech Info: Adequate    SPECIAL CARE FACTORS FREQUENCY  PT (By licensed PT), OT (By licensed OT)     PT Frequency: 5x/week OT Frequency: 3x/week            Contractures      Additional Factors Info  Code Status, Allergies Code Status Info: DNR Allergies Info: Aleve Naproxen Sodium, Antihistamines, Diphenhydramine-type, Nsaids           Current Medications (03/28/2018):  This is the current hospital active medication list Current Facility-Administered Medications  Medication Dose Route Frequency Provider Last Rate Last Dose  . acetaminophen (TYLENOL) tablet 650 mg  650 mg Oral Q6H PRN Ina Homes, MD       Or  . acetaminophen (TYLENOL) suppository 650 mg  650 mg Rectal Q6H PRN Helberg,  Larkin Ina, MD      . allopurinol (ZYLOPRIM) tablet 200 mg  200 mg Oral QHS Helberg, Justin, MD      . B-complex with vitamin C tablet 1 tablet  1 tablet Oral Daily Ina Homes, MD   1 tablet at 03/28/18 1035  . clopidogrel (PLAVIX) tablet 75 mg  75 mg Oral Daily Ina Homes, MD   75 mg at 03/28/18 1035  . finasteride (PROSCAR) tablet 5 mg  5 mg Oral Daily Helberg, Justin, MD   5 mg at  03/28/18 1035  . heparin injection 5,000 Units  5,000 Units Subcutaneous Q8H Ina Homes, MD   5,000 Units at 03/28/18 0530  . metoprolol tartrate (LOPRESSOR) tablet 25 mg  25 mg Oral BID Ina Homes, MD   25 mg at 03/28/18 1035  . polyethylene glycol (MIRALAX / GLYCOLAX) packet 17 g  17 g Oral Daily Ina Homes, MD   17 g at 03/28/18 1035  . primidone (MYSOLINE) tablet 50 mg  50 mg Oral QHS Ina Homes, MD   50 mg at 03/27/18 2306  . sodium chloride 0.9 % bolus 500 mL  500 mL Intravenous Once Ina Homes, MD         Discharge Medications: Please see discharge summary for a list of discharge medications.  Relevant Imaging Results:  Relevant Lab Results:   Additional Information SSN: [place], [place]

## 2018-03-28 NOTE — Progress Notes (Signed)
Patient received from ED via bed. Patient is alert and oriented x 4. Vital signs are stable. Skin assessment done with another nurse. Iv in place and patient is on telemetry. Patient given instruction about call bell and phone. Bed in low position and side rail up x2. Call bell in reach.

## 2018-03-28 NOTE — Progress Notes (Signed)
CSW received insurance approval for patient to discharge to [facility] over the weekend. Auth# [id number] RVB. [name] updated. Weekend CSW to follow up.  Percell Locus Coltrane Tugwell LCSW [phone]

## 2018-03-28 NOTE — Care Management CC44 (Signed)
Condition Code 44 Documentation Completed  Patient Details  Name: Drew Gibson MRN: [id number] Date of Birth: 1929-03-16   Condition Code 44 given:   yes Patient signature on Condition Code 44 notice:   yes Documentation of 2 MD's agreement:   yes Code 44 added to claim:   yes    Sharin Mons, RN 03/28/2018, 11:48 AM

## 2018-03-28 NOTE — Evaluation (Signed)
Physical Therapy Evaluation Patient Details Name: Drew Gibson MRN: [id number] DOB: October 24, 1928 Today's Date: 03/28/2018   History of Present Illness  pt is a 84 y/o male with PMH of PAD, CKD, falls, and HTN admitted with syncope and collapse, also with dx of pubic fx 1 day PTA from a fall  Clinical Impression  Pt presenting with decreased functional mobility following fall with resulting pelvis fracture.  Would benefit from acute PT to progress functional mobility in preparation for d/c to post acute rehab to maximize independence prior to returning home with wife.  Pt currently requiring mod assist overall for limited functional mobility.  Would be excellent candidate for CIR level therapy at d/c.    Follow Up Recommendations CIR;Supervision/Assistance - 24 hour    Equipment Recommendations  None recommended by PT    Recommendations for Other Services Rehab consult;OT consult     Precautions / Restrictions Precautions Precautions: Fall Restrictions Weight Bearing Restrictions: No      Mobility  Bed Mobility Overal bed mobility: Needs Assistance Bed Mobility: Supine to Sit     Supine to sit: HOB elevated;Min guard        Transfers Overall transfer level: Needs assistance Equipment used: Rolling walker (2 wheeled) Transfers: Sit to/from Stand Sit to Stand: Mod assist         General transfer comment: mod fade to min assist with practice and cues for forward weight shift and hand placement  Ambulation/Gait Ambulation/Gait assistance: Min assist Gait Distance (Feet): 5 Feet Assistive device: Rolling walker (2 wheeled) Gait Pattern/deviations: Step-to pattern;Decreased step length - right;Decreased step length - left;Shuffle;Trunk flexed;Narrow base of support        Stairs            Wheelchair Mobility    Modified Rankin (Stroke Patients Only)       Balance Overall balance assessment: Needs assistance Sitting-balance support: Bilateral upper  extremity supported Sitting balance-Leahy Scale: Poor     Standing balance support: Bilateral upper extremity supported Standing balance-Leahy Scale: Zero                               Pertinent Vitals/Pain Pain Assessment: No/denies pain    Home Living Family/patient expects to be discharged to:: Private residence Living Arrangements: Spouse/significant other Available Help at Discharge: Family;Available 24 hours/day Type of Home: House Home Access: Ramped entrance     Home Layout: Two level;Other (Comment)(bath in basement, stair lift to acces) Home Equipment: Walker - 2 wheels;Bedside commode;Wheelchair - manual      Prior Function Level of Independence: Needs assistance   Gait / Transfers Assistance Needed: supervision  ADL's / Homemaking Assistance Needed: supervision        Hand Dominance        Extremity/Trunk Assessment   Upper Extremity Assessment Upper Extremity Assessment: Defer to OT evaluation    Lower Extremity Assessment Lower Extremity Assessment: Generalized weakness    Cervical / Trunk Assessment Cervical / Trunk Assessment: Kyphotic  Communication   Communication: No difficulties  Cognition Arousal/Alertness: Awake/alert Behavior During Therapy: WFL for tasks assessed/performed Overall Cognitive Status: Within Functional Limits for tasks assessed                                        General Comments      Exercises     Assessment/Plan  PT Assessment Patient needs continued PT services  PT Problem List Decreased strength;Decreased range of motion;Decreased activity tolerance;Decreased balance;Decreased mobility;Decreased coordination;Decreased safety awareness;Decreased knowledge of use of DME       PT Treatment Interventions DME instruction;Gait training;Functional mobility training;Therapeutic activities;Therapeutic exercise;Balance training;Neuromuscular re-education;Patient/family education     PT Goals (Current goals can be found in the Care Plan section)  Acute Rehab PT Goals Patient Stated Goal: to get stronger and go home PT Goal Formulation: With patient Time For Goal Achievement: 04/11/18 Potential to Achieve Goals: Good    Frequency Min 3X/week   Barriers to discharge Decreased caregiver support      Co-evaluation               AM-PAC PT "6 Clicks" Daily Activity  Outcome Measure Difficulty turning over in bed (including adjusting bedclothes, sheets and blankets)?: A Little Difficulty moving from lying on back to sitting on the side of the bed? : A Little Difficulty sitting down on and standing up from a chair with arms (e.g., wheelchair, bedside commode, etc,.)?: A Little Help needed moving to and from a bed to chair (including a wheelchair)?: A Lot Help needed walking in hospital room?: A Lot Help needed climbing 3-5 steps with a railing? : Total 6 Click Score: 14    End of Session Equipment Utilized During Treatment: Gait belt Activity Tolerance: Patient tolerated treatment well Patient left: in chair;with call bell/phone within reach;with family/visitor present Nurse Communication: Mobility status PT Visit Diagnosis: Unsteadiness on feet (R26.81);Repeated falls (R29.6);Muscle weakness (generalized) (M62.81);Difficulty in walking, not elsewhere classified (R26.2)    Time: 2336-1224 PT Time Calculation (min) (ACUTE ONLY): 26 min   Charges:   PT Evaluation $PT Eval Low Complexity: 1 Low PT Treatments $Therapeutic Activity: 8-22 mins   PT G Codes:          Michel Santee 03/28/2018, 12:27 PM

## 2018-03-28 NOTE — Progress Notes (Signed)
  Echocardiogram 2D Echocardiogram has been performed.  Jennette Dubin 03/28/2018, 10:10 AM

## 2018-03-29 MED ORDER — FUROSEMIDE 10 MG/ML IJ SOLN
20.0000 mg | Freq: Once | INTRAMUSCULAR | Status: AC
  Administered 2018-03-29: 20 mg via INTRAVENOUS
  Filled 2018-03-29: qty 2

## 2018-03-29 NOTE — Progress Notes (Addendum)
Subjective: Feeling well today except for pain in the hip. He has had no chest pain, shortness of breath, or syncope overnight. He does describe dyspnea on exertion which has been going on for a while, home med list includes PRN lasix which he has taken once in the past week but not much prior to that. He does not have orthopnea, has been using one pillow for a while and that is stable.   Objective:  Vital signs in last 24 hours: Vitals:   03/28/18 0620 03/28/18 1309 03/28/18 2040 03/29/18 0451  BP: 139/87 (!) 143/91 (!) 143/85 (!) 142/90  Pulse: 69 69 68 64  Resp: 16 18 18 16   Temp: 97.6 F (36.4 C) 97.7 F (36.5 C) 98 F (36.7 C) 97.8 F (36.6 C)  TempSrc: Oral Oral Oral   SpO2: 98% 96% 95% 96%  Weight:      Height:       General: well appearing, no acute distress  Cardiac: regular rate and rhythm, normal S1 and S2, no murmur appreciated, no peripheral edema, JVD appreciated 3 cm above the clavicle when the back of the bed is elevated at 45 degrees  Pulm: normal work of breathing, lungs clear to auscultation    Assessment/Plan:  Active Problems:   Syncope  Syncope: Possibly related to recent use of tramadol which was new for him. Considering cardiogenic etiology, telemetry monitoring overnight showed persistent 1st degree AV block and sinus arrhythmia. Patient and wife are agreeable to SNF placement. He is unable to walk after pubic ramus fracture 2 days ago. -- Echocardiogram with normal EF, grade 2 diastolic dysfunction, LVH, no regional wall motion abnormalities severe left atrial dilation and increased pulmonary artery pressure 60 mm Hg. Mild mitral and pulmonic regurgitation -- PT / OT > recs 24 hour assistance   Acute on chronic diastolic heart failure  - Echo with grade 2 diastolic dysfunction, increased pulmonary artery pressures and non compressible IVC  - symptoms of persistent dyspnea on exertion with walking short distances such as from his car to the house which  is new over the past month, JVD on exam, and 3 lb weight gain since the time of an outpatient office visit one month ago ( baseline weight is 169 lbs ) are consistent with some degree of volume overload - IV lasix 20 mg today then resume home lasix 20 mg PRN 3 lb weight gain in  - counseled on the use of PRN lasix at home as needed for wt gain greater than 3 lbs in 24 hours or 5 lbs in one week. He will likely need to take this more consistently at home after discharge  - daily weights, strict intake and output  - will monitor on telemetry for one additional day, may be able to discharge tomorrow after some IV diuresis    Nondisplaced Right Inferior Pubic Rami. Patient found to have a nondisplaced fracture after falling on 6/26 while trying to stand up after using the bathroom.  -- PT/OT evaluated and found that he required moderate assistance, rec 24 hour assistance   Severe Peripheral Neuropathy. Patient has had peripheral neuropathy for >30 years. Indicates that he has had nerve biopsies and EMGs in the past that have been inconclusive. Unfortunately we do not have these records.  -- B12 and TSH >> within normal limits  -- No further work up  PAD. PE with diminished peripheral pulses and cool LEs. ABIs in 03/2017 illustrating significant PAD in the bilateral LEs. Has  never seen vascular surgery.  -- Continue plavix  AKI on CKD Stage IV -- Crt on admission was increased to 2.46 from baseline around 2.0. Improved to 2.14 today  -- Avoiding nephrotoxic dugs  HTN -- Continue metoprolol  Gout  - continue home allopurinol   BPH  - continue home finasteride   Dispo: Anticipated discharge in approximately 1 day(s).   Ledell Noss, MD 03/29/2018, 8:24 AM Pager: [phone]

## 2018-03-29 NOTE — Discharge Summary (Signed)
Name: Drew Gibson MRN: [id number] DOB: 11/14/1928 84 y.o. PCP: [name], NP  Date of Admission: 03/27/2018  2:08 PM Date of Discharge: 03/30/18 Attending Physician: Dr. Dareen Piano Discharge Diagnosis: 1.  Syncopal episode. 2.  Acute on chronic diastolic heart failure. 3.  Nondisplaced fracture of pubic rami. 4.  Hypertension. 5. BPH 6. AKI on CKD IV 7. PAD 8.  Severe peripheral neuropathy.  Discharge Medications: Allergies as of 03/30/2018      Reactions   Aleve [naproxen Sodium] Other (See Comments)   Upset stomach    Antihistamines, Diphenhydramine-type Other (See Comments)   Causes urinary issues   Nsaids    upset stomach      Medication List    STOP taking these medications   traMADol 50 MG tablet Commonly known as:  ULTRAM     TAKE these medications   acetaminophen 500 MG tablet Commonly known as:  TYLENOL Take 500-1,000 mg by mouth every 6 (six) hours as needed.   allopurinol 100 MG tablet Commonly known as:  ZYLOPRIM TAKE 1 TABLET BY MOUTH TWICE DAILY TO PREVENT GOUT. What changed:  See the new instructions.   b complex vitamins capsule Take 1 capsule by mouth daily.   clopidogrel 75 MG tablet Commonly known as:  PLAVIX TAKE 1 TABLET BY MOUTH ONCE DAILY FOR  ANTICOAGULATION   finasteride 5 MG tablet Commonly known as:  PROSCAR TAKE 1 TABLET BY MOUTH ONCE DAILY TO  HELP  SHRINK  PROSTATE   furosemide 20 MG tablet Commonly known as:  LASIX Take 1 tablet (20 mg total) by mouth daily as needed for edema.   metoprolol tartrate 25 MG tablet Commonly known as:  LOPRESSOR Take 1 tablet (25 mg total) by mouth 2 (two) times daily.   OVER THE COUNTER MEDICATION Take 1 capsule by mouth at bedtime. VISION ALIGN   PRESERVISION AREDS Caps Take 1 capsule by mouth daily.   primidone 50 MG tablet Commonly known as:  MYSOLINE TAKE 1 TABLET BY MOUTH ONCE DAILY IN THE EVENING TO HELP WITH TREMORS.   Vitamin D3 2000 units Tabs Take 2,000 Units by  mouth daily.       Disposition and follow-up:   DrewHasani C Gibson was discharged from [facility] in Stable condition.  At the hospital follow up visit please address:  1.  Volume overload - Screen for symptoms of volume overload- DOE, orthopnea, peripheral edema. Reeducate on daily weights   2.  Labs / imaging needed at time of follow-up: BMP  3.  Pending labs/ test needing follow-up: none   Follow-up Appointments: Follow-up Information    [name], NP. Schedule an appointment as soon as possible for a visit in 1 week(s).   Specialty:  Geriatric Medicine Contact information: [address]. [place] [address] [phone]           Hospital Course by problem list: Syncope Drew Gibson is a very pleasant 84 year old man with past medical history of CKD stage IV, peripheral neuropathy, and history of bilateral hip fracture status post ORIF who presented to the ED after a syncopal episode.  He has had prep aggressive peripheral neuropathy and frequent falls, most recently he had fallen and suffered pubic ramus fracture 2 days prior to admission which did not require surgery and he was sent with tramadol for pain management.  Subsequently he had syncopal-like events one while sitting in his wheelchair.  At the time of admission the tramadol was held  he was placed on telemetry monitoring and an echo was ordered.  Telemetry showed persistent first-degree AV block.  Echo showed increased pulmonary artery pressure and a noncompressible IVC.  He described 1 month of dyspnea on exertion leading up to the hospitalization, his PCP had started him on p.o. Lasix as needed for lower extremity edema.  He was treated with 1 dose of IV Lasix and counseled on taking daily weights and monitoring symptoms of dyspnea and orthopnea to no one to take the Lasix p.o. at home.  He experienced no further syncope-like episodes during the duration of the hospitalization and was discharged  to SNF for short period of rehabilitation.  Nondisplaced Right Inferior Pubic Rami. Patient found to have a nondisplaced fracture after falling on 6/26 while trying to stand up after using the bathroom.  PT/OTevaluated and found that he required moderate assistance,rec 24 hour assistance  Severe Peripheral Neuropathy. Patient has had peripheral neuropathy for >30 years. Indicates that he has had nerve biopsies and EMGs in the past that have been inconclusive. Unfortunately we do not have these records.  -B12 and TSH>>within normal limits  - No further work up was done during current hospitalization.  PAD. PE with diminished peripheral pulses and cool LEs. ABIs in 03/2017 illustrating significant PAD in the bilateral LEs. Has never seen vascular surgery.  -Continue plavix  AKI onCKD Stage IV --Crt on admission was increased to 2.46 from baseline around 2.0. Improved to 2.14 yesterday and there is mild increased to 2.31 today -Avoiding nephrotoxic dugs  HTN.  Blood pressure mostly within goal. -Continue metoprolol  Gout .  No gout flareup during hospitalization. - continue home allopurinol   BPH. - continue home finasteride   Discharge Vitals:   BP (!) 144/86 (BP Location: Left Arm)   Pulse 67   Temp 97.6 F (36.4 C) (Oral)   Resp 18   Ht 5\' 8"  (1.727 m)   Wt 171 lb 11.8 oz (77.9 kg)   SpO2 97%   BMI 26.11 kg/m   General.  Well-developed elderly man, sitting comfortably in chair, in no acute distress. Lungs.  Clear bilaterally, normal work of breathing. CV.  Regular rate and rhythm, no murmur/rub/gallop. Abdomen.  Soft, nontender, bowel sounds positive. Extremities.  Trace lower extremity edema bilaterally, pulses 1+ bilaterally.  Pertinent Labs, Studies, and Procedures:  CBC Latest Ref Rng & Units 03/28/2018 03/27/2018 12/31/2017  WBC 4.0 - 10.5 K/uL 6.7 7.5 6.9  Hemoglobin 13.0 - 17.0 g/dL 12.8(L) 12.9(L) 13.9  Hematocrit 39.0 - 52.0 % 40.7 42.1 40.2   Platelets 150 - 400 K/uL 112(L) 113(L) 163   CMP     Component Value Date/Time   NA 139 03/30/2018 0444   NA 142 02/06/2016 1012   K 5.0 03/30/2018 0444   CL 107 03/30/2018 0444   CO2 24 03/30/2018 0444   GLUCOSE 99 03/30/2018 0444   BUN 56 (H) 03/30/2018 0444   BUN 40 (H) 02/06/2016 1012   CREATININE 2.31 (H) 03/30/2018 0444   CREATININE 2.00 (H) 03/05/2018 0924   CALCIUM 8.7 (L) 03/30/2018 0444   PROT 6.4 02/26/2018 1215   PROT 6.4 09/27/2015 0944   ALBUMIN 3.4 (L) 12/03/2016 0908   ALBUMIN 3.5 09/27/2015 0944   AST 24 02/26/2018 1215   ALT 30 02/26/2018 1215   ALKPHOS 70 12/03/2016 0908   BILITOT 0.5 02/26/2018 1215   BILITOT 0.3 09/27/2015 0944   GFRNONAA 24 (L) 03/30/2018 0444   GFRNONAA 26 (L) 02/26/2018 1215  GFRAA 28 (L) 03/30/2018 0444   GFRAA 31 (L) 02/26/2018 1215   Urinalysis    Component Value Date/Time   COLORURINE YELLOW 03/27/2018 1438   APPEARANCEUR HAZY (A) 03/27/2018 1438   APPEARANCEUR Turbid (A) 06/16/2013 1205   LABSPEC 1.013 03/27/2018 1438   PHURINE 5.0 03/27/2018 1438   GLUCOSEU NEGATIVE 03/27/2018 1438   HGBUR NEGATIVE 03/27/2018 1438   BILIRUBINUR NEGATIVE 03/27/2018 1438   BILIRUBINUR Negative 06/16/2013 [place] 03/27/2018 1438   PROTEINUR >=300 (A) 03/27/2018 1438   UROBILINOGEN 0.2 08/04/2011 0952   NITRITE NEGATIVE 03/27/2018 1438   LEUKOCYTESUR NEGATIVE 03/27/2018 1438   LEUKOCYTESUR 3+ (A) 06/16/2013 1205   TSH. 2.130 B12. 802  EKG. first-degree heart block with mild T wave inversion in inferior leads.  DG Hip with pelvis. FINDINGS: There is questioned minimal deformity of the cortex of the right inferior pubic rami, fracture is not excluded. There is no dislocation. Prior fixation of bilateral femur are noted. Marked degenerative joint changes of bilateral hips with narrowed joint space and osteophyte formation are noted.  IMPRESSION: Question minimal deformity of the cortex of the right inferior  pubic rami, fracture is not excluded.  DG Chest.FINDINGS: The heart is mildly enlarged. There are no focal consolidations or pleural effusions. No pulmonary edema. No suspicious nodules identified.  IMPRESSION: Cardiomegaly. No evidence for acute pulmonary abnormality.  CT Head. FINDINGS: Brain: Generalized brain atrophy. Chronic small-vessel ischemic changes of the cerebral hemispheric white matter. Findings are progressive since 2011. No sign of acute infarction, mass lesion, hemorrhage, hydrocephalus or extra-axial collection.  Vascular: There is atherosclerotic calcification of the major vessels at the base of the brain.  Skull: Negative  Sinuses/Orbits: Mild chronic inflammatory changes of the maxillary sinuses. Other sinuses clear. Orbits negative.  Other: None  IMPRESSION: No acute finding. Atrophy and chronic small-vessel ischemic changes, progressive since 2011.  ECHO.03/28/18 Study Conclusions  - Left ventricle: The cavity size was normal. Wall thickness was   increased in a pattern of moderate LVH. Systolic function was   normal. The estimated ejection fraction was in the range of 55%   to 60%. Wall motion was normal; there were no regional wall   motion abnormalities. Features are consistent with a pseudonormal   left ventricular filling pattern, with concomitant abnormal   relaxation and increased filling pressure (grade 2 diastolic   dysfunction). - Left atrium: The atrium was severely dilated. - Pulmonary arteries: Systolic pressure was moderately increased.   PA peak pressure: 60 mm Hg (S).  Discharge Instructions: Discharge Instructions    Call MD for:  difficulty breathing, headache or visual disturbances   Complete by:  As directed    Call MD for:  temperature >100.4   Complete by:  As directed    Diet - low sodium heart healthy   Complete by:  As directed    Diet - low sodium heart healthy   Complete by:  As directed    Discharge  instructions   Complete by:  As directed    It was pleasure taking care of you. Please weigh yourself daily, if there is an increase in weight of 3 pound in 1 day or 5 pound in 1 week, take 1 dose of Lasix 20 mg.  If you become more short of breath please be evaluated by your PCP. Please take rest of your medications as directed.   Increase activity slowly   Complete by:  As directed    Increase activity slowly  Complete by:  As directed       Signed: Lorella Nimrod, MD 03/30/2018, 11:07 AM   Pager: [phone]

## 2018-03-30 DIAGNOSIS — N179 Acute kidney failure, unspecified: Secondary | ICD-10-CM

## 2018-03-30 DIAGNOSIS — R1312 Dysphagia, oropharyngeal phase: Secondary | ICD-10-CM | POA: Diagnosis not present

## 2018-03-30 DIAGNOSIS — I440 Atrioventricular block, first degree: Secondary | ICD-10-CM | POA: Diagnosis not present

## 2018-03-30 DIAGNOSIS — J90 Pleural effusion, not elsewhere classified: Secondary | ICD-10-CM | POA: Diagnosis not present

## 2018-03-30 DIAGNOSIS — M6281 Muscle weakness (generalized): Secondary | ICD-10-CM | POA: Diagnosis not present

## 2018-03-30 DIAGNOSIS — G629 Polyneuropathy, unspecified: Secondary | ICD-10-CM | POA: Diagnosis not present

## 2018-03-30 DIAGNOSIS — Z9181 History of falling: Secondary | ICD-10-CM | POA: Diagnosis not present

## 2018-03-30 DIAGNOSIS — I5033 Acute on chronic diastolic (congestive) heart failure: Secondary | ICD-10-CM

## 2018-03-30 DIAGNOSIS — M2550 Pain in unspecified joint: Secondary | ICD-10-CM | POA: Diagnosis not present

## 2018-03-30 DIAGNOSIS — S32501D Unspecified fracture of right pubis, subsequent encounter for fracture with routine healing: Secondary | ICD-10-CM | POA: Diagnosis not present

## 2018-03-30 DIAGNOSIS — W19XXXD Unspecified fall, subsequent encounter: Secondary | ICD-10-CM | POA: Diagnosis not present

## 2018-03-30 DIAGNOSIS — R55 Syncope and collapse: Secondary | ICD-10-CM | POA: Diagnosis not present

## 2018-03-30 DIAGNOSIS — I272 Pulmonary hypertension, unspecified: Secondary | ICD-10-CM

## 2018-03-30 DIAGNOSIS — M109 Gout, unspecified: Secondary | ICD-10-CM | POA: Diagnosis not present

## 2018-03-30 DIAGNOSIS — N184 Chronic kidney disease, stage 4 (severe): Secondary | ICD-10-CM | POA: Diagnosis not present

## 2018-03-30 DIAGNOSIS — M48062 Spinal stenosis, lumbar region with neurogenic claudication: Secondary | ICD-10-CM | POA: Diagnosis not present

## 2018-03-30 DIAGNOSIS — Z886 Allergy status to analgesic agent status: Secondary | ICD-10-CM | POA: Diagnosis not present

## 2018-03-30 DIAGNOSIS — R278 Other lack of coordination: Secondary | ICD-10-CM | POA: Diagnosis not present

## 2018-03-30 DIAGNOSIS — Z7401 Bed confinement status: Secondary | ICD-10-CM | POA: Diagnosis not present

## 2018-03-30 DIAGNOSIS — R2681 Unsteadiness on feet: Secondary | ICD-10-CM | POA: Diagnosis not present

## 2018-03-30 DIAGNOSIS — R488 Other symbolic dysfunctions: Secondary | ICD-10-CM | POA: Diagnosis not present

## 2018-03-30 DIAGNOSIS — M24549 Contracture, unspecified hand: Secondary | ICD-10-CM | POA: Diagnosis not present

## 2018-03-30 DIAGNOSIS — S32591D Other specified fracture of right pubis, subsequent encounter for fracture with routine healing: Secondary | ICD-10-CM | POA: Diagnosis not present

## 2018-03-30 DIAGNOSIS — N400 Enlarged prostate without lower urinary tract symptoms: Secondary | ICD-10-CM

## 2018-03-30 DIAGNOSIS — Z79899 Other long term (current) drug therapy: Secondary | ICD-10-CM | POA: Diagnosis not present

## 2018-03-30 DIAGNOSIS — I130 Hypertensive heart and chronic kidney disease with heart failure and stage 1 through stage 4 chronic kidney disease, or unspecified chronic kidney disease: Secondary | ICD-10-CM | POA: Diagnosis not present

## 2018-03-30 DIAGNOSIS — R2689 Other abnormalities of gait and mobility: Secondary | ICD-10-CM | POA: Diagnosis not present

## 2018-03-30 DIAGNOSIS — I739 Peripheral vascular disease, unspecified: Secondary | ICD-10-CM | POA: Diagnosis not present

## 2018-03-30 DIAGNOSIS — S32591A Other specified fracture of right pubis, initial encounter for closed fracture: Secondary | ICD-10-CM | POA: Diagnosis not present

## 2018-03-30 LAB — BASIC METABOLIC PANEL
Anion gap: 8 (ref 5–15)
BUN: 56 mg/dL — AB (ref 8–23)
CO2: 24 mmol/L (ref 22–32)
CREATININE: 2.31 mg/dL — AB (ref 0.61–1.24)
Calcium: 8.7 mg/dL — ABNORMAL LOW (ref 8.9–10.3)
Chloride: 107 mmol/L (ref 98–111)
GFR, EST AFRICAN AMERICAN: 28 mL/min — AB (ref >60)
GFR, EST NON AFRICAN AMERICAN: 24 mL/min — AB (ref >60)
Glucose, Bld: 99 mg/dL (ref 70–99)
POTASSIUM: 5 mmol/L (ref 3.5–5.1)
SODIUM: 139 mmol/L (ref 135–145)

## 2018-03-30 MED ORDER — FUROSEMIDE 20 MG PO TABS
20.0000 mg | ORAL_TABLET | Freq: Every day | ORAL | Status: DC
  Administered 2018-03-30: 20 mg via ORAL
  Filled 2018-03-30: qty 1

## 2018-03-30 NOTE — Progress Notes (Signed)
Subjective: Patient was feeling better when seen this morning.  He denies any chest pain or shortness of breath.  Patient and his wife has few questions regarding his benefit going to SNF.  We asked social worker to make a visit and clarified before discharge.  Objective:  Vital signs in last 24 hours: Vitals:   03/29/18 1512 03/29/18 2217 03/30/18 0435 03/30/18 0550  BP: 119/80 (!) 155/82 (!) 144/86   Pulse: 67 74 67   Resp: 16 18 18    Temp: (!) 97.5 F (36.4 C) 97.7 F (36.5 C) 97.6 F (36.4 C)   TempSrc: Oral Oral Oral   SpO2: 96% 94% 97%   Weight:    171 lb 11.8 oz (77.9 kg)  Height:       General.  Well-developed elderly man, sitting comfortably in chair, in no acute distress. Lungs.  Clear bilaterally, normal work of breathing. CV.  Regular rate and rhythm, no murmur/rub/gallop. Abdomen.  Soft, nontender, bowel sounds positive. Extremities.  Trace lower extremity edema bilaterally, pulses 1+ bilaterally.  Assessment/Plan: 84 year old gentleman admitted after having a brief episode of syncope after BM, EKG and telemetry with first-degree heart block.  He has an history of frequent falls due to his extensive neuropathy, recent pubic rami fracture.  Syncope: Most likely secondary to his recent tramadol use.  Denies any more dizziness, lightheadedness or palpitations.  Echo with a grade 2 diastolic dysfunction and pulmonary hypertension. The patient and wife his some questions regarding his insurance benefit going to SNF, asked the social worker for better explanation. They will decide after talking with social worker whether to go to SNF or to go home with home health. -Can be discharged if bed is available or patient decided to go home.  Acute on chronic diastolic heart failure: Echo on March 28, 2018 with grade 2 diastolic dysfunction, pulmonary hypertension and noncompressible IVC.  He was given 20 mg of IV Lasix yesterday, resulted in 1 pound weight decrease.  Patient  denies any shortness of breath or orthopnea, but does not ambulate much because of his recent fracture.  He was recently started on as needed Lasix by his primary care. Mildly worsens creatinine today at 2.31 from 2.14 yesterday. -Patient to continue Lasix as needed-was given instructions to weigh himself daily, and take Lasix if there is an increase in weight of 3 pound in 1 day or 5 pound in 1 week. -He will be given Lasix 20 mg PO today before discharge. -Continue Lopressor. -Continue daily weight and strict in's and outs.  Nondisplaced Right Inferior Pubic Rami. Patient found to have a nondisplaced fracture after falling on 6/26 while trying to stand up after using the bathroom.  --PT/OT evaluated and found that he required moderate assistance,rec 24 hour assistance   Severe Peripheral Neuropathy. Patient has had peripheral neuropathy for >30 years. Indicates that he has had nerve biopsies and EMGs in the past that have been inconclusive. Unfortunately we do not have these records.  --B12 and TSH>>within normal limits  -- No further work up  PAD. PE with diminished peripheral pulses and cool LEs. ABIs in 03/2017 illustrating significant PAD in the bilateral LEs. Has never seen vascular surgery.  --Continue plavix  AKI on CKD Stage IV --Crt on admission was increased to 2.46 from baseline around 2.0. Improved to 2.14 yesterday and there is mild increased to 2.31 today  --Avoiding nephrotoxic dugs  HTN --Continue metoprolol  Gout  - continue home allopurinol   BPH  -  continue home finasteride   Dispo: Anticipated discharge in approximately 0 day(s).   Lorella Nimrod, MD 03/30/2018, 10:13 AM Pager: [phone]

## 2018-03-30 NOTE — Progress Notes (Signed)
Patient will Discharge To: [facility] Anticipated DC Date:03/30/18 Family Notified:yes, Jahziel Sinn (at bedside), [phone] Transport QW:BEQU   Per MD patient ready for DC to [facility] . RN, patient, patient's family, and facility notified of DC. Assessment, Fl2/Pasrr, and Discharge Summary sent to facility. RN given number for report [phone], pt room # 604P). DC packet on chart. Ambulance transport requested for patient.   CSW signing off.  Reed Breech LCSWA [phone]

## 2018-03-30 NOTE — Progress Notes (Signed)
Internal Medicine Attending:   I saw and examined the patient. I reviewed the resident's note and I agree with the resident's findings and plan as documented in the resident's note.  Patient feels well today with no new complaints.  Patient was initially admitted to the hospital with syncope thought to be secondary to his recent tramadol use versus vasovagal in nature.  Syncope work-up completed.  Patient with 2D echo done yesterday which showed a dilated IVC concerning for an elevated CVP and he was given an additional dose of IV Lasix yesterday.  On physical exam today he only has trace lower extremity edema and his lungs are clear to auscultation bilaterally.  We will resume his home Lasix dose today.  Patient is stable for discharge to SNF today.  Extensive discussion held with patient as well as wife at bedside.  They expressed understanding and are in agreement with plan.  No further work-up at this time.

## 2018-03-30 NOTE — Progress Notes (Signed)
[name] Section to be D/C'd to [facility] per MD order. Report called to [facility] at [facility].  VVS, Skin clean, dry and intact without evidence of skin break down, no evidence of skin tears noted.  IV catheter discontinued intact. Site without signs and symptoms of complications. Dressing and pressure applied.  An After Visit Summary was printed and given to the patient.  Patient escorted via stretcher, and D/C to [facility] via [place].  Melonie Florida  03/30/2018 12:53 PM

## 2018-03-31 DIAGNOSIS — G629 Polyneuropathy, unspecified: Secondary | ICD-10-CM | POA: Diagnosis not present

## 2018-03-31 DIAGNOSIS — N184 Chronic kidney disease, stage 4 (severe): Secondary | ICD-10-CM | POA: Diagnosis not present

## 2018-03-31 DIAGNOSIS — R55 Syncope and collapse: Secondary | ICD-10-CM | POA: Diagnosis not present

## 2018-03-31 DIAGNOSIS — N179 Acute kidney failure, unspecified: Secondary | ICD-10-CM | POA: Diagnosis not present

## 2018-03-31 NOTE — Telephone Encounter (Signed)
Error

## 2018-04-01 ENCOUNTER — Ambulatory Visit: Payer: Medicare Other | Admitting: Nurse Practitioner

## 2018-04-21 DIAGNOSIS — I509 Heart failure, unspecified: Secondary | ICD-10-CM | POA: Diagnosis not present

## 2018-04-21 DIAGNOSIS — R350 Frequency of micturition: Secondary | ICD-10-CM | POA: Diagnosis not present

## 2018-04-21 DIAGNOSIS — I440 Atrioventricular block, first degree: Secondary | ICD-10-CM | POA: Diagnosis not present

## 2018-04-21 DIAGNOSIS — S32591D Other specified fracture of right pubis, subsequent encounter for fracture with routine healing: Secondary | ICD-10-CM | POA: Diagnosis not present

## 2018-04-21 DIAGNOSIS — N184 Chronic kidney disease, stage 4 (severe): Secondary | ICD-10-CM | POA: Diagnosis not present

## 2018-04-21 DIAGNOSIS — K5900 Constipation, unspecified: Secondary | ICD-10-CM | POA: Diagnosis not present

## 2018-04-21 DIAGNOSIS — N401 Enlarged prostate with lower urinary tract symptoms: Secondary | ICD-10-CM | POA: Diagnosis not present

## 2018-04-21 DIAGNOSIS — I739 Peripheral vascular disease, unspecified: Secondary | ICD-10-CM | POA: Diagnosis not present

## 2018-04-21 DIAGNOSIS — I130 Hypertensive heart and chronic kidney disease with heart failure and stage 1 through stage 4 chronic kidney disease, or unspecified chronic kidney disease: Secondary | ICD-10-CM | POA: Diagnosis not present

## 2018-04-21 DIAGNOSIS — G629 Polyneuropathy, unspecified: Secondary | ICD-10-CM | POA: Diagnosis not present

## 2018-04-22 ENCOUNTER — Telehealth: Payer: Self-pay | Admitting: *Deleted

## 2018-04-22 DIAGNOSIS — R600 Localized edema: Secondary | ICD-10-CM

## 2018-04-22 DIAGNOSIS — N184 Chronic kidney disease, stage 4 (severe): Secondary | ICD-10-CM

## 2018-04-22 DIAGNOSIS — R269 Unspecified abnormalities of gait and mobility: Secondary | ICD-10-CM

## 2018-04-22 DIAGNOSIS — I10 Essential (primary) hypertension: Secondary | ICD-10-CM

## 2018-04-22 NOTE — Telephone Encounter (Signed)
Patient wife, Inez Catalina called and stated that patient was discharged from [facility] Saturday and has an appointment with Dr. Linna Darner on Friday to follow up. But wife has concerns with patient's BP running high. Take reading an hour after taking blood pressure medication. Has had some swelling but not too bad. Less swollen in the morning than at night. Is using compression stocking. Not taking Furosemide. [name] told them to use the Furosemide as needed. They want to know if they should still use it as needed. Stated patient complains of headache when his bp goes up. On Metoprolol twice daily. Therapist told her they can furnished scales to weigh if a nurse referral is placed. Right now just has PT/OT. Using [facility]. Please Advise.   7/20- am 133/112  Pulse 73 (called on call)           Pm 163/105 7/21-  Am 130/82    83           Pm 150/98    66 7/22-  Am 136/86    80  Pm 154/110   04/22/18- 128/86      79

## 2018-04-22 NOTE — Telephone Encounter (Signed)
Patient wife notified and agreed. Patient wife stated that he is taking the BP medication 1 hour before taking medication. And will continue doing so.   Order for [facility] placed.

## 2018-04-22 NOTE — Telephone Encounter (Signed)
Ok to add nursing to home health so they can get a scale.  Are the PM bps before or after his metoprolol?  If before, that might be why it's high.   I'd like her to check his bp one hour after his medication in the morning and evening.  Keep the appt with [facility] on Friday. In terms of lasix, would not take unless he's had a 3 lb weight gain in one day or 5 lbs in one week.  (won't know until they have a scale)

## 2018-04-23 ENCOUNTER — Telehealth: Payer: Self-pay | Admitting: *Deleted

## 2018-04-23 NOTE — Telephone Encounter (Signed)
Lennette Bihari from [facility] called to inform Dr. Mariea Clonts that the Occupational therapist is delayed and will start next week. They are a little short staffed  right now.

## 2018-04-24 ENCOUNTER — Other Ambulatory Visit: Payer: Self-pay | Admitting: Nurse Practitioner

## 2018-04-24 DIAGNOSIS — R972 Elevated prostate specific antigen [PSA]: Secondary | ICD-10-CM

## 2018-04-25 ENCOUNTER — Encounter: Payer: Self-pay | Admitting: Internal Medicine

## 2018-04-25 ENCOUNTER — Ambulatory Visit (INDEPENDENT_AMBULATORY_CARE_PROVIDER_SITE_OTHER): Payer: Medicare Other | Admitting: Internal Medicine

## 2018-04-25 VITALS — BP 128/78 | HR 73 | Temp 97.7°F | Ht 68.0 in | Wt 170.0 lb

## 2018-04-25 DIAGNOSIS — I10 Essential (primary) hypertension: Secondary | ICD-10-CM

## 2018-04-25 DIAGNOSIS — R55 Syncope and collapse: Secondary | ICD-10-CM | POA: Diagnosis not present

## 2018-04-25 DIAGNOSIS — N183 Chronic kidney disease, stage 3 unspecified: Secondary | ICD-10-CM

## 2018-04-25 DIAGNOSIS — M1A9XX0 Chronic gout, unspecified, without tophus (tophi): Secondary | ICD-10-CM

## 2018-04-25 NOTE — Assessment & Plan Note (Signed)
Sodium restriction discussed Take Lasix 20 mg daily and monitor response of edema

## 2018-04-25 NOTE — Patient Instructions (Addendum)
Read all labels ; consume LESS than 40 grams of sugar / day from foods & drinks with  High Fructose Corn Syrup as #1, 2 , 3 or # 4 on label.(Note : dividing the "grams of sugar" on label by 4 gives "teaspoons of sugar" content of food or drink. For example a 22 oz Coke has 68 grams of sugar or 17 tsp of sugar). Your ideal BP goal = AVERAGE < 135/85.Minimal goal is average < 140/90. Avoid ingestion of  excess salt/sodium.Cook with pepper & other spices . Use the salt substitute "No Salt" OR the Mrs Deliah Boston products to season food @ the table. Avoid foods which taste salty or "vinegary" as their sodium content will be high.  Perform isometric exercise of calves  ( while seated go up on toes to count of 5  & then onto heels for 5 count). Repeat  4- 5 times prior to standing if you've been seated or supine for any significant period of time as BP drops with such positions.

## 2018-04-25 NOTE — Progress Notes (Signed)
04/25/2018 This is a [employer] office visit  follow up for transition care post SNF rehabilitation.  Interim medical record and care since last [place] visit was updated with review of diagnostic studies and change in clinical status since last visit were documented.  HPI: The patient was hospitalized 6/27-6/30/19 for syncopal episode in the context of acute on chronic diastolic heart failure. He sustained a nondisplaced fracture of pubic rami and was in [facility] for PT/OT rehabilitation. He denies any cardiac or neurologic prodrome prior to the syncopal episode. Apparently he had gotten up from the commode and was ambulating to his wheelchair when he experienced syncope.  Telemetry revealed persistent 1st degree AV Block. He believes he was told that the syncopal episode"might be related to his tramadol".  By history he had a hip fracture after a syncopal episode in 2017. He does have significant peripheral neuropathy but the fall was not related to balance issues. His wife is concerned that intermittently he has significant blood pressure elevations. Blood pressures ranges from 130/82 up to 163/110. Chart was reviewed. He has creatinine of 2.31, BUN of 56, GFR of 24. He is taking the Lasix "as needed". He does have persistent edema. He was also anemic with hemoglobin 12.8 hematocrit 40.7. MCV was 103.3 but vitamin B12 level was normal. Despite the renal insufficiency he is on allopurinol 100 mg twice a day. The last uric acid on record was in 2016.  Review of systems:  Constitutional: No fever, significant weight change, fatigue  Eyes: No redness, discharge, pain, vision change ENT/mouth: No nasal congestion,  purulent discharge, earache, change in hearing, sore throat  Cardiovascular: No chest pain, palpitations, paroxysmal nocturnal dyspnea, claudication  Respiratory: No cough, sputum production, hemoptysis, DOE, significant snoring, apnea   Gastrointestinal: No heartburn,  dysphagia, abdominal pain, nausea /vomiting, rectal bleeding, melena, change in bowels Genitourinary: No dysuria, hematuria, pyuria, incontinence, nocturia Musculoskeletal: No joint stiffness, joint swelling, weakness, pain Dermatologic: No rash, pruritus, change in appearance of skin Neurologic: No dizziness, headache, recurrence of syncope, seizures Psychiatric: No significant anxiety, depression, insomnia, anorexia Endocrine: No change in hair/skin/nails, excessive thirst, excessive hunger, excessive urination  Hematologic/lymphatic: No significant bruising, lymphadenopathy, abnormal bleeding Allergy/immunology: No itchy/watery eyes, significant sneezing, urticaria, angioedema  Physical exam:  Pertinent or positive findings:Pattern alopecia is present. He has keratoses of the scalp. Ptosis is present bilaterally, greater on the left. He has complete dentures. The S2 is slurred. Breath sounds are decreased. 1+ edema of lower extremities present. Pedal pulses are decreased. He exhibits a tremor of the left upper extremity. He has diffuse weakness, greater in the lower extremities than the upper extremities. He has interosseous wasting of the hands.   General appearance: no acute distress, increased work of breathing is present.   Lymphatic: No lymphadenopathy about the head, neck, axilla. Eyes: No conjunctival inflammation or lid edema is present. There is no scleral icterus. Ears:  External ear exam shows no significant lesions or deformities.   Nose:  External nasal examination shows no deformity or inflammation. Nasal mucosa are pink and moist without lesions, exudates Oral exam:  Lips and gums are healthy appearing. There is no oropharyngeal erythema or exudate. Neck:  No thyromegaly, masses, tenderness noted.    Heart:  Normal rate and regular rhythm. S1 normal without gallop, murmur, click, rub .  Lungs:  without wheezes, rhonchi, rales, rubs. Abdomen: Bowel sounds are normal. Abdomen is  soft and nontender with no organomegaly, hernias, masses. GU: Deferred  Extremities:  No cyanosis, clubbing Neurologic exam : Balance, Rhomberg, finger to nose testing could not be completed due to clinical state Skin: Warm & dry w/o tenting. No significant  rash.  See summary under each active problem in the Problem List with associated updated therapeutic plan

## 2018-04-25 NOTE — Assessment & Plan Note (Signed)
See patient instructions. We need to verify his average blood pressure and rule out postural hypotension as a component in his syncope resulting in fracture.

## 2018-04-26 ENCOUNTER — Encounter: Payer: Self-pay | Admitting: Internal Medicine

## 2018-04-26 NOTE — Assessment & Plan Note (Signed)
Postural BP checks

## 2018-04-26 NOTE — Assessment & Plan Note (Addendum)
To prevent gout the minimal uric acid goal is < 7; preferred is < 6, ideal or protective value is < 5 .  The most common cause of elevated uric acid is the ingestion of sugar from high fructose corn syrup sources in processed foods & drinks. You should consume less than 40 grams (ideally ZERO) of sugar per day from foods and drinks with high fructose corn syrup as number 1,2, 3, or #4 on the label. Chronic elevation of uric acid is not an isolated arthritic issue;it also increases cardiac & kidney disease risk. Hopefully Allopurinol can be weaned & D/Ced due to renal issues

## 2018-04-28 ENCOUNTER — Telehealth: Payer: Self-pay

## 2018-04-28 ENCOUNTER — Other Ambulatory Visit: Payer: Self-pay

## 2018-04-28 DIAGNOSIS — M1A9XX0 Chronic gout, unspecified, without tophus (tophi): Secondary | ICD-10-CM

## 2018-04-28 NOTE — Telephone Encounter (Signed)
Incoming call received from [facility] requesting verbal orders for occupational therapy 1 week 1 and 2 week 3.  Per [employer] standing order, verbal order given. Message will be sent to patient's provider as a FYI.   Jarrett Ables, NP is out of office, message sent to covering doctor, Dr.Reed.

## 2018-05-01 DIAGNOSIS — L84 Corns and callosities: Secondary | ICD-10-CM | POA: Diagnosis not present

## 2018-05-01 DIAGNOSIS — B351 Tinea unguium: Secondary | ICD-10-CM | POA: Diagnosis not present

## 2018-05-01 DIAGNOSIS — M79672 Pain in left foot: Secondary | ICD-10-CM | POA: Diagnosis not present

## 2018-05-01 DIAGNOSIS — M79671 Pain in right foot: Secondary | ICD-10-CM | POA: Diagnosis not present

## 2018-05-02 ENCOUNTER — Telehealth: Payer: Self-pay

## 2018-05-02 NOTE — Telephone Encounter (Signed)
Physical Therapist Lennette Bihari called to inform Dr.Reed patient with CHF Symptoms, Excerebration and he would like verbal orders for a skilled nurse evaluation.  Per [employer] standing order, verbal order given. Message will be sent to patient's provider as a FYI.

## 2018-05-08 ENCOUNTER — Ambulatory Visit (INDEPENDENT_AMBULATORY_CARE_PROVIDER_SITE_OTHER): Payer: Medicare Other | Admitting: Family

## 2018-05-08 ENCOUNTER — Encounter: Payer: Self-pay | Admitting: Family

## 2018-05-08 VITALS — BP 144/98 | HR 66 | Temp 97.5°F | Resp 14 | Ht 68.0 in | Wt 149.3 lb

## 2018-05-08 DIAGNOSIS — I10 Essential (primary) hypertension: Secondary | ICD-10-CM

## 2018-05-08 DIAGNOSIS — R2681 Unsteadiness on feet: Secondary | ICD-10-CM

## 2018-05-08 DIAGNOSIS — H6123 Impacted cerumen, bilateral: Secondary | ICD-10-CM

## 2018-05-08 MED ORDER — CARBAMIDE PEROXIDE 6.5 % OT SOLN: 5.0000 [drp] | Freq: Two times a day (BID) | OTIC | 0 refills | Status: AC

## 2018-05-08 NOTE — Patient Instructions (Addendum)
1.Please check blood pressure twice daily and record.Notify provider's office if 3 consecutive blood readings are above 160/90 2. Continue on current blood pressure medication 3. Debrox 6.5% otic drops instil 5 drops into each ear twice daily x 4 days then follow up at the office for ear lavage.Call office for appointment after completing ear drops.   Hypertension Hypertension is another name for high blood pressure. High blood pressure forces your heart to work harder to pump blood. This can cause problems over time. There are two numbers in a blood pressure reading. There is a top number (systolic) over a bottom number (diastolic). It is best to have a blood pressure below 120/80. Healthy choices can help lower your blood pressure. You may need medicine to help lower your blood pressure if:  Your blood pressure cannot be lowered with healthy choices.  Your blood pressure is higher than 130/80.  Follow these instructions at home: Eating and drinking  If directed, follow the DASH eating plan. This diet includes: ? Filling half of your plate at each meal with fruits and vegetables. ? Filling one quarter of your plate at each meal with whole grains. Whole grains include whole wheat pasta, brown rice, and whole grain bread. ? Eating or drinking low-fat dairy products, such as skim milk or low-fat yogurt. ? Filling one quarter of your plate at each meal with low-fat (lean) proteins. Low-fat proteins include fish, skinless chicken, eggs, beans, and tofu. ? Avoiding fatty meat, cured and processed meat, or chicken with skin. ? Avoiding premade or processed food.  Eat less than 1,500 mg of salt (sodium) a day.  Limit alcohol use to no more than 1 drink a day for nonpregnant women and 2 drinks a day for men. One drink equals 12 oz of beer, 5 oz of wine, or 1 oz of hard liquor. Lifestyle  Work with your doctor to stay at a healthy weight or to lose weight. Ask your doctor what the best weight is  for you.  Get at least 30 minutes of exercise that causes your heart to beat faster (aerobic exercise) most days of the week. This may include walking, swimming, or biking.  Get at least 30 minutes of exercise that strengthens your muscles (resistance exercise) at least 3 days a week. This may include lifting weights or pilates.  Do not use any products that contain nicotine or tobacco. This includes cigarettes and e-cigarettes. If you need help quitting, ask your doctor.  Check your blood pressure at home as told by your doctor.  Keep all follow-up visits as told by your doctor. This is important. Medicines  Take over-the-counter and prescription medicines only as told by your doctor. Follow directions carefully.  Do not skip doses of blood pressure medicine. The medicine does not work as well if you skip doses. Skipping doses also puts you at risk for problems.  Ask your doctor about side effects or reactions to medicines that you should watch for. Contact a doctor if:  You think you are having a reaction to the medicine you are taking.  You have headaches that keep coming back (recurring).  You feel dizzy.  You have swelling in your ankles.  You have trouble with your vision. Get help right away if:  You get a very bad headache.  You start to feel confused.  You feel weak or numb.  You feel faint.  You get very bad pain in your: ? Chest. ? Belly (abdomen).  You throw up (  vomit) more than once.  You have trouble breathing. Summary  Hypertension is another name for high blood pressure.  Making healthy choices can help lower blood pressure. If your blood pressure cannot be controlled with healthy choices, you may need to take medicine. This information is not intended to replace advice given to you by your health care provider. Make sure you discuss any questions you have with your health care provider. Document Released: 03/05/2008 Document Revised: 08/15/2016  Document Reviewed: 08/15/2016 Elsevier Interactive Patient Education  [employer].

## 2018-05-08 NOTE — Progress Notes (Signed)
Provider: Cally Nygard FNP-C  [name], NP  Patient Care Team: [name], NP as PCP - General (Geriatric Medicine)  Extended Emergency Contact Information Primary Emergency Contact: Cerasoli,Betty Address: [address]          [place], [place] [address] Johnnette Litter of [place] Phone: [phone] Relation: Spouse Secondary Emergency Contact: Vandenberg,Joel Address: [address] [place], [place] [address] Johnnette Litter of [place] Phone: [phone] Relation: Son   Goals of care: Advanced Directive information Advanced Directives 04/25/2018  Does Patient Have a Medical Advance Directive? No  Type of Advance Directive -  Does patient want to make changes to medical advance directive? -  Copy of [place] in Chart? -  Would patient like information on creating a medical advance directive? No - Patient declined  Pre-existing out of facility DNR order (yellow form or pink MOST form) -     Chief Complaint  Patient presents with  . Hypertension    would also like to get uric acid checked if possible    HPI:  Pt is a 84 y.o. male seen today at [facility] office for an acute visit for evaluation of high blood pressure.He is escorted by his wife and son.Patient's wife states patient walked to the bathroom yesterday but was unable to stand and use his walker in the bathroom.she assisted patient to sit on the floor and called EMS.He was assisted to sit on his wheelchair.His blood pressure was 160/100 and blood sugar was 81.blood pressure later rechecked was 123/70. He states has headaches occasionally but denies any dizziness,chest pain,shortness of breath or cough.He has had no abrupt weight gain.He currently works with Physical therapy for unsteady gait and weakness.wife states has 4 more weeks with therapy.  He also request for uric acid level to be checked states it's been awhile since levels was checked.he denies any signs or  symptoms of gout.he is currently taking allopurinol 100 mg tablet daily.discussed with patient that levels can be checked during his upcoming appointment with all his other lab work.patient and wife agrees for uric acid to be checked with next labs.      Past Medical History:  Diagnosis Date  . Abnormality of gait   . Benign neoplasm of colon   . Benign prostate hyperplasia   . Chronic kidney disease, stage IV (severe) ([place])   . Closed fracture of ramus of right pubis ([place])   . Complication of anesthesia    postop headache after general anesthesia  . Degenerative arthritis   . Diverticulosis of colon (without mention of hemorrhage)   . Duodenal ulcer, unspecified as acute or chronic, without hemorrhage, perforation, or obstruction   . Encounter for long-term (current) use of other medications   . Enlarged prostate    takes Tamsulosin daily  . Essential and other specified forms of tremor   . Foley catheter in place 09-01-13   4 months now  . Foot drop, bilateral   . Gait disorder    balance issues-uses walker or mobile chair.  . Gout    takes Allopurinol daily  . Gout, unspecified   . Hyperlipidemia    but doesn't require meds  . Hypertension    takes Enalapril daily   . Inguinal hernia without mention of obstruction or gangrene, unilateral or unspecified, (not specified as recurrent)   . Internal hemorrhoids without mention of complication   . Macular degeneration  takes eye cap vits daily  . Other abnormal blood chemistry   . PAD (peripheral artery disease) ([place])   . Peripheral neuropathy   . Peripheral neuropathy   . Pneumonia    hx of;last time 8'14-mild.  . Ptosis    Left side  . Spinal stenosis, lumbar region, with neurogenic claudication   . Syncope   . TIA (transient ischemic attack)    sees Dr.Willis-neurologist 6'14(strange feeling of head)-no residual  . Unspecified vitamin D deficiency   . Urinary frequency   . Urinary urgency   . UTI (lower urinary  tract infection) 09-01-13   tx. 2 weeks ago   Past Surgical History:  Procedure Laterality Date  . CATARACT EXTRACTION Right 08/31/2014   Dr.Lyles  . colonosocpy    . FINGER SURGERY Left    Left ring finger  . HERNIA REPAIR     at least 48yrs ago  . HIP PINNING,CANNULATED Left 05/05/2013   Procedure: CANNULATED HIP PINNING- left;  Surgeon: Renette Butters, MD;  Location: [place];  Service: Orthopedics;  Laterality: Left;  . INGUINAL HERNIA REPAIR  09/05/2012   Procedure: LAPAROSCOPIC INGUINAL HERNIA;  Surgeon: Gayland Curry, MD,FACS;  Location: [place];  Service: General;  Laterality: Left;  . INSERTION OF MESH  09/05/2012   Procedure: INSERTION OF MESH;  Surgeon: Gayland Curry, MD,FACS;  Location: [place];  Service: General;  Laterality: Left;  . left leg surgery  2011  . NERVE BIOPSY    . TRANSURETHRAL RESECTION OF PROSTATE N/A 09/07/2013   Procedure: CYSTO TRANSURETHRAL RESECTION OF THE PROSTATE (TURP);  Surgeon: Reece Packer, MD;  Location[facility];  Service: Urology;  Laterality: N/A;    Allergies  Allergen Reactions  . Aleve [Naproxen Sodium] Other (See Comments)    Upset stomach   . Antihistamines, Diphenhydramine-Type Other (See Comments)    Causes urinary issues  . Nsaids     upset stomach    Outpatient Encounter Medications as of 05/08/2018  Medication Sig  . acetaminophen (TYLENOL) 500 MG tablet Take 500-1,000 mg by mouth every 6 (six) hours as needed.  Marland Kitchen allopurinol (ZYLOPRIM) 100 MG tablet Take 100 mg by mouth daily.   Marland Kitchen b complex vitamins capsule Take 1 capsule by mouth daily.   . Cholecalciferol (VITAMIN D3) 2000 UNITS TABS Take 2,000 Units by mouth daily.   . clopidogrel (PLAVIX) 75 MG tablet TAKE 1 TABLET BY MOUTH ONCE DAILY FOR  ANTICOAGULATION  . finasteride (PROSCAR) 5 MG tablet TAKE 1 TABLET BY MOUTH ONCE DAILY TO  HELP  SHRINK  PROSTATE  . furosemide (LASIX) 20 MG tablet Take 1 tablet (20 mg total) by mouth daily as needed for edema.  . metoprolol tartrate  (LOPRESSOR) 25 MG tablet Take 1 tablet (25 mg total) by mouth 2 (two) times daily.  Marland Kitchen OVER THE COUNTER MEDICATION Take 1 capsule by mouth at bedtime. VISION ALIGN   . primidone (MYSOLINE) 50 MG tablet TAKE 1 TABLET BY MOUTH ONCE DAILY IN THE EVENING TO HELP WITH TREMORS.  . [DISCONTINUED] Multiple Vitamins-Minerals (PRESERVISION AREDS) CAPS Take 1 capsule by mouth daily.   No facility-administered encounter medications on file as of 05/08/2018.     Review of Systems  Constitutional: Negative for appetite change, chills, fatigue, fever and unexpected weight change.  HENT: Negative for congestion, rhinorrhea, sinus pressure, sinus pain, sneezing and sore throat.   Eyes: Positive for visual disturbance. Negative for discharge, redness and itching.       Wears  eye glasses   Respiratory: Negative for cough, chest tightness, shortness of breath and wheezing.   Cardiovascular: Positive for leg swelling. Negative for chest pain and palpitations.  Gastrointestinal: Negative for abdominal distention, abdominal pain, constipation, diarrhea, nausea and vomiting.  Genitourinary: Negative for dysuria, flank pain and urgency.  Musculoskeletal: Positive for gait problem.  Skin: Negative for color change, pallor and rash.  Neurological: Negative for dizziness, syncope and light-headedness.       Occasional headache  Psychiatric/Behavioral: Negative for agitation, confusion and sleep disturbance. The patient is not nervous/anxious.     Immunization History  Administered Date(s) Administered  . Influenza, High Dose Seasonal PF 07/03/2017  . Influenza,inj,Quad PF,6+ Mos 07/07/2013, 07/23/2014, 06/29/2015, 06/06/2016  . Influenza-Unspecified 07/11/2011  . Pneumococcal Conjugate-13 12/05/2016  . Pneumococcal Polysaccharide-23 11/02/1995  . Td 07/08/2006  . Tdap 08/25/2016   Pertinent  Health Maintenance Due  Topic Date Due  . PNA vac Low Risk Adult (2 of 2 - PPSV23) 12/05/2017  . INFLUENZA VACCINE   05/01/2018   Fall Risk  04/25/2018 03/26/2018 03/13/2018 03/05/2018 02/26/2018  Falls in the past year? Yes Yes Yes Yes Yes  Number falls in past yr: 1 2 or more 1 2 or more 2 or more  Comment - - - - 4 within last 12 months as of 02/26/18  Injury with Fall? Yes Yes Yes Yes No  Comment - - - - -  Risk for fall due to : - - - - -   Functional Status Survey:    Vitals:   05/08/18 1040  BP: (!) 144/98  Pulse: 66  Resp: 14  Temp: (!) 97.5 F (36.4 C)  TempSrc: Oral  SpO2: 95%  Weight: 149 lb 4.8 oz (67.7 kg)  Height: 5\' 8"  (1.727 m)   Body mass index is 22.7 kg/m. Physical Exam  Constitutional: He is oriented to person, place, and time.  Thin built,elderly in no acute distress   HENT:  Head: Normocephalic.  Mouth/Throat: Oropharynx is clear and moist. No oropharyngeal exudate.  Bilateral TM not visualized due to cerumen impaction   Eyes: Pupils are equal, round, and reactive to light. Conjunctivae and EOM are normal. Right eye exhibits no discharge. No scleral icterus.  Eye glasses in place   Neck: Normal range of motion. No JVD present. No thyromegaly present.  Cardiovascular: Normal rate, regular rhythm, normal heart sounds and intact distal pulses. Exam reveals no gallop and no friction rub.  No murmur heard. Pulmonary/Chest: Effort normal and breath sounds normal. No respiratory distress. He has no wheezes. He has no rales.  Abdominal: Soft. Bowel sounds are normal. He exhibits no distension and no mass. There is no tenderness. There is no rebound and no guarding.  Musculoskeletal: He exhibits no tenderness.  Unsteady gait ambulates with walker and uses wheelchair for long distance.bilateral lower extremities trace edema.Knee high ted hose in place.   Lymphadenopathy:    He has no cervical adenopathy.  Neurological: He is oriented to person, place, and time. Gait abnormal.  Skin: Skin is warm and dry. No rash noted. No erythema. No pallor.  Psychiatric: He has a normal mood  and affect. His speech is normal and behavior is normal. Judgment and thought content normal.  Vitals reviewed.   Labs reviewed: Recent Labs    03/27/18 1435 03/28/18 0628 03/30/18 0444  NA 140 140 139  K 4.4 4.6 5.0  CL 105 107 107  CO2 24 26 24   GLUCOSE 158* 101* 99  BUN 48*  44* 56*  CREATININE 2.46* 2.14* 2.31*  CALCIUM 8.8* 8.9 8.7*   Recent Labs    06/12/17 1448 12/31/17 1219 02/26/18 1215  AST 15 19 24   ALT 12 19 30   BILITOT 0.3 0.5 0.5  PROT 6.3 6.5 6.4   Recent Labs    06/12/17 1448 12/31/17 1219 03/27/18 1435 03/28/18 0628  WBC 7.2 6.9 7.5 6.7  NEUTROABS 3,629 3,933  --   --   HGB 13.2 13.9 12.9* 12.8*  HCT 39.1 40.2 42.1 40.7  MCV 95.8 95.0 105.0* 103.3*  PLT 161 163 113* 112*   Lab Results  Component Value Date   TSH 2.130 03/28/2018   No results found for: HGBA1C Lab Results  Component Value Date   CHOL 279 (H) 12/03/2016   HDL 62 12/03/2016   LDLCALC 195 (H) 12/03/2016   TRIG 109 12/03/2016   CHOLHDL 4.5 12/03/2016    Significant Diagnostic Results in last 30 days:  No results found.  Assessment/Plan 1. Essential hypertension Elevated blood pressure yesterday but later readings within normal range.suspected due to activity inability to stand in the bathroom and being assisted to the floor by wife.will make no changes in the blood pressure medication today.Encouraged to check B/p twice daily and Notify provider's office if SBP readings above 160 for 3 consecutive times.continue on Metoprolol tartrate 25 mg tablet twice daily and furosemide 20 mg tablet daily as needed for edema.  2.unsteady gait   No recent fall episode but was lowered to the floor in the bathroom yesterday by wife after he was unable to stand with walker.no injuries.continue to work with Therapy.has 4 more weeks with Therapy.   3. Cerumen Impaction  Bilateral TM not visualized due to impaction.instil Debrox 6.5% otic solutions 5 drops into each ear twice daily x 4 days  then make appointment with providers office for ear lavage.    Family/ staff Communication: Reviewed plan of care with patient,wife and son.  Labs/tests ordered: None   Horrace Hanak C Mohogany Toppins, NP

## 2018-05-15 ENCOUNTER — Ambulatory Visit (INDEPENDENT_AMBULATORY_CARE_PROVIDER_SITE_OTHER): Payer: Medicare Other | Admitting: Internal Medicine

## 2018-05-15 ENCOUNTER — Encounter: Payer: Self-pay | Admitting: Internal Medicine

## 2018-05-15 VITALS — BP 128/70 | HR 70 | Temp 97.6°F | Ht 68.0 in | Wt 168.0 lb

## 2018-05-15 DIAGNOSIS — I10 Essential (primary) hypertension: Secondary | ICD-10-CM | POA: Diagnosis not present

## 2018-05-15 DIAGNOSIS — M1A9XX0 Chronic gout, unspecified, without tophus (tophi): Secondary | ICD-10-CM | POA: Diagnosis not present

## 2018-05-15 DIAGNOSIS — I5032 Chronic diastolic (congestive) heart failure: Secondary | ICD-10-CM | POA: Diagnosis not present

## 2018-05-15 MED ORDER — AMLODIPINE BESYLATE 5 MG PO TABS: 5.0000 mg | ORAL_TABLET | Freq: Every day | ORAL | 3 refills | Status: DC

## 2018-05-15 NOTE — Progress Notes (Signed)
Location:  [facility] clinic Provider: Eva Vallee L. Mariea Clonts, D.O., C.M.D.  Code Status:  Code Status History    Date Active Date Inactive Code Status Order ID Comments User Context   03/27/2018 2049 03/30/2018 1710 DNR [id number]  Ina Homes, MD Inpatient   05/04/2013 2001 05/09/2013 1717 Full Code [id number]  Thurnell Lose, MD Inpatient   09/05/2012 1541 09/07/2012 1238 Full Code [id number]  Roosvelt Harps, RN Inpatient   08/05/2011 0008 08/07/2011 1348 Full Code [id number]  Andre Lefort, RN Inpatient    Questions for Most Recent Historical Code Status (Order [id number])    Question Answer Comment   In the event of cardiac or respiratory ARREST Do not call a "code blue"    In the event of cardiac or respiratory ARREST Do not perform Intubation, CPR, defibrillation or ACLS    In the event of cardiac or respiratory ARREST Use medication by any route, position, wound care, and other measures to relive pain and suffering. May use oxygen, suction and manual treatment of airway obstruction as needed for comfort.       Goals of Care:  Advanced Directives 04/25/2018  Does Patient Have a Medical Advance Directive? No  Type of Advance Directive -  Does patient want to make changes to medical advance directive? -  Copy of [place] in Chart? -  Would patient like information on creating a medical advance directive? No - Patient declined  Pre-existing out of facility DNR order (yellow form or pink MOST form) -   Chief Complaint  Patient presents with  . Acute Visit    discuss blood pressure    HPI: Patient is a 84 y.o. male seen today for an acute visit for concerns about blood pressure being elevated.    BP all over the place:  128/82-172/116.  Yesterday evening, he went out on the deck to sit.  Then got up and came into the bathroom, then bp high.  He's not dizzy.  Does get headache when BP high.   He had not taken his med this morning and bp was 169/117.  Here it's 128/70.   He  has grade 2 diastolic dysfunction, aortic calcification.  Had first degree AV block on ekg.   Weight is actually stable at 168lbs. Gets sob if active.  Past Medical History:  Diagnosis Date  . Abnormality of gait   . Benign neoplasm of colon   . Benign prostate hyperplasia   . Chronic kidney disease, stage IV (severe) ([place])   . Closed fracture of ramus of right pubis ([place])   . Complication of anesthesia    postop headache after general anesthesia  . Degenerative arthritis   . Diverticulosis of colon (without mention of hemorrhage)   . Duodenal ulcer, unspecified as acute or chronic, without hemorrhage, perforation, or obstruction   . Encounter for long-term (current) use of other medications   . Enlarged prostate    takes Tamsulosin daily  . Essential and other specified forms of tremor   . Foley catheter in place 09-01-13   4 months now  . Foot drop, bilateral   . Gait disorder    balance issues-uses walker or mobile chair.  . Gout    takes Allopurinol daily  . Gout, unspecified   . Hyperlipidemia    but doesn't require meds  . Hypertension    takes Enalapril daily   . Inguinal hernia without mention of obstruction or gangrene, unilateral or unspecified, (not specified as recurrent)   .  Internal hemorrhoids without mention of complication   . Macular degeneration    takes eye cap vits daily  . Other abnormal blood chemistry   . PAD (peripheral artery disease) ([place])   . Peripheral neuropathy   . Peripheral neuropathy   . Pneumonia    hx of;last time 8'14-mild.  . Ptosis    Left side  . Spinal stenosis, lumbar region, with neurogenic claudication   . Syncope   . TIA (transient ischemic attack)    sees Dr.Willis-neurologist 6'14(strange feeling of head)-no residual  . Unspecified vitamin D deficiency   . Urinary frequency   . Urinary urgency   . UTI (lower urinary tract infection) 09-01-13   tx. 2 weeks ago    Past Surgical History:  Procedure Laterality Date  .  CATARACT EXTRACTION Right 08/31/2014   Dr.Lyles  . colonosocpy    . FINGER SURGERY Left    Left ring finger  . HERNIA REPAIR     at least 77yrs ago  . HIP PINNING,CANNULATED Left 05/05/2013   Procedure: CANNULATED HIP PINNING- left;  Surgeon: Renette Butters, MD;  Location: [facility];  Service: Orthopedics;  Laterality: Left;  . INGUINAL HERNIA REPAIR  09/05/2012   Procedure: LAPAROSCOPIC INGUINAL HERNIA;  Surgeon: Gayland Curry, MD,FACS;  Location: [facility];  Service: General;  Laterality: Left;  . INSERTION OF MESH  09/05/2012   Procedure: INSERTION OF MESH;  Surgeon: Gayland Curry, MD,FACS;  Location: [facility] [place];  Service: General;  Laterality: Left;  . left leg surgery  2011  . NERVE BIOPSY    . TRANSURETHRAL RESECTION OF PROSTATE N/A 09/07/2013   Procedure: CYSTO TRANSURETHRAL RESECTION OF THE PROSTATE (TURP);  Surgeon: Reece Packer, MD;  [facility];  Service: Urology;  Laterality: N/A;    Allergies  Allergen Reactions  . Aleve [Naproxen Sodium] Other (See Comments)    Upset stomach   . Antihistamines, Diphenhydramine-Type Other (See Comments)    Causes urinary issues  . Dimenhydrinate     Causes urinary issues Causes urinary issues  . Nsaids     upset stomach  . [place]     Bother his stomach    Outpatient Encounter Medications as of 05/15/2018  Medication Sig  . acetaminophen (TYLENOL) 500 MG tablet Take 500-1,000 mg by mouth every 6 (six) hours as needed.  Marland Kitchen allopurinol (ZYLOPRIM) 100 MG tablet Take 100 mg by mouth daily.   Marland Kitchen b complex vitamins capsule Take 1 capsule by mouth daily.   . Cholecalciferol (VITAMIN D3) 2000 UNITS TABS Take 2,000 Units by mouth daily.   . clopidogrel (PLAVIX) 75 MG tablet TAKE 1 TABLET BY MOUTH ONCE DAILY FOR  ANTICOAGULATION  . finasteride (PROSCAR) 5 MG tablet TAKE 1 TABLET BY MOUTH ONCE DAILY TO  HELP  SHRINK  PROSTATE  . furosemide (LASIX) 20 MG tablet Take 1 tablet (20 mg total) by mouth daily as needed for edema.  . metoprolol tartrate  (LOPRESSOR) 25 MG tablet Take 1 tablet (25 mg total) by mouth 2 (two) times daily.  Marland Kitchen OVER THE COUNTER MEDICATION Take 1 capsule by mouth at bedtime. VISION ALIGN   . primidone (MYSOLINE) 50 MG tablet TAKE 1 TABLET BY MOUTH ONCE DAILY IN THE EVENING TO HELP WITH TREMORS.   No facility-administered encounter medications on file as of 05/15/2018.     Review of Systems:  Review of Systems  Constitutional: Negative for chills and fever.  HENT: Negative for congestion.   Eyes:  Glasses  Respiratory: Positive for shortness of breath. Negative for cough, hemoptysis, sputum production and wheezing.        With exertion "doing anything significant"  Cardiovascular: Negative for chest pain, palpitations and leg swelling.       Edema better with compression hose  Gastrointestinal: Negative for abdominal pain and constipation.  Genitourinary: Negative for dysuria.  Musculoskeletal: Negative for falls.  Neurological: Positive for headaches. Negative for dizziness.    Health Maintenance  Topic Date Due  . PNA vac Low Risk Adult (2 of 2 - PPSV23) 12/05/2017  . INFLUENZA VACCINE  05/01/2018  . TETANUS/TDAP  08/25/2026    Physical Exam: Vitals:   05/15/18 1027  BP: 128/70  Pulse: 70  Temp: 97.6 F (36.4 C)  TempSrc: Oral  SpO2: 96%  Weight: 168 lb (76.2 kg)  Height: 5\' 8"  (1.727 m)   Body mass index is 25.54 kg/m. Physical Exam  Constitutional: He is oriented to person, place, and time. No distress.  HENT:  Head: Normocephalic and atraumatic.  Cardiovascular: Normal rate, regular rhythm, normal heart sounds and intact distal pulses.  No murmur heard. Pulmonary/Chest: Effort normal and breath sounds normal. No respiratory distress. He has no rales.  Abdominal: Bowel sounds are normal.  Musculoskeletal: Normal range of motion.  Comes in wheelchair  Neurological: He is alert and oriented to person, place, and time.  Skin: Skin is warm and dry.  Psychiatric: He has a normal  mood and affect.    Labs reviewed: Basic Metabolic Panel: Recent Labs    03/27/18 1435 03/28/18 0628 03/30/18 0444  NA 140 140 139  K 4.4 4.6 5.0  CL 105 107 107  CO2 24 26 24   GLUCOSE 158* 101* 99  BUN 48* 44* 56*  CREATININE 2.46* 2.14* 2.31*  CALCIUM 8.8* 8.9 8.7*  TSH  --  2.130  --    Liver Function Tests: Recent Labs    06/12/17 1448 12/31/17 1219 02/26/18 1215  AST 15 19 24   ALT 12 19 30   BILITOT 0.3 0.5 0.5  PROT 6.3 6.5 6.4   No results for input(s): LIPASE, AMYLASE in the last 8760 hours. No results for input(s): AMMONIA in the last 8760 hours. CBC: Recent Labs    06/12/17 1448 12/31/17 1219 03/27/18 1435 03/28/18 0628  WBC 7.2 6.9 7.5 6.7  NEUTROABS 3,629 3,933  --   --   HGB 13.2 13.9 12.9* 12.8*  HCT 39.1 40.2 42.1 40.7  MCV 95.8 95.0 105.0* 103.3*  PLT 161 163 113* 112*   Assessment/Plan 1. Essential hypertension - bp sporadically very high - will try him on norvasc 5mg  mid-day to try to balance out the bp - his wife will continue to check his bp one hour after his am pills and one hour after his evening pills -avoid checking bp after walking around--must rest for a few minutes first - call back if norvasc causes dizziness - amLODipine (NORVASC) 5 MG tablet; Take 1 tablet (5 mg total) by mouth daily. At lunch  Dispense: 30 tablet; Refill: 3  2. Chronic diastolic CHF (congestive heart failure), NYHA class 2 (HCC) -stable symptoms, weight also stable, cont prn lasix only and bid metoprolol  3. Chronic gout without tophus, unspecified cause, unspecified site - allopurinol has been reduced due to renal function - Uric Acid check ordered by PCP so will get done today while here  Labs/tests ordered:  No orders of the defined types were placed in this encounter.  Next appt:  3 wk f/u bp check  [place] Alexa L. Chivonne Rascon, D.O. [facility] [address], [place] [address] Cell Phone (Mon-Fri 8am-5pm):   [phone] On Call:  [phone] & follow prompts after 5pm & weekends Office Phone:  [phone] Office Fax:  [phone]

## 2018-05-16 LAB — URIC ACID: Uric Acid, Serum: 8.5 mg/dL — ABNORMAL HIGH (ref 4.0–8.0)

## 2018-05-27 DIAGNOSIS — I129 Hypertensive chronic kidney disease with stage 1 through stage 4 chronic kidney disease, or unspecified chronic kidney disease: Secondary | ICD-10-CM | POA: Diagnosis not present

## 2018-05-27 DIAGNOSIS — R809 Proteinuria, unspecified: Secondary | ICD-10-CM | POA: Diagnosis not present

## 2018-05-27 DIAGNOSIS — N184 Chronic kidney disease, stage 4 (severe): Secondary | ICD-10-CM | POA: Diagnosis not present

## 2018-05-27 DIAGNOSIS — I5030 Unspecified diastolic (congestive) heart failure: Secondary | ICD-10-CM | POA: Diagnosis not present

## 2018-06-04 ENCOUNTER — Ambulatory Visit (INDEPENDENT_AMBULATORY_CARE_PROVIDER_SITE_OTHER): Payer: Medicare Other | Admitting: Internal Medicine

## 2018-06-04 ENCOUNTER — Encounter: Payer: Self-pay | Admitting: Internal Medicine

## 2018-06-04 ENCOUNTER — Ambulatory Visit
Admission: RE | Admit: 2018-06-04 | Discharge: 2018-06-04 | Disposition: A | Discharge status: Home / Self Care | Payer: Medicare Other | Source: Ambulatory Visit | Attending: Internal Medicine | Admitting: Internal Medicine

## 2018-06-04 VITALS — BP 118/68 | HR 84 | Temp 97.9°F | Ht 68.0 in | Wt 166.4 lb

## 2018-06-04 DIAGNOSIS — R05 Cough: Secondary | ICD-10-CM | POA: Diagnosis not present

## 2018-06-04 DIAGNOSIS — I10 Essential (primary) hypertension: Secondary | ICD-10-CM

## 2018-06-04 DIAGNOSIS — I5032 Chronic diastolic (congestive) heart failure: Secondary | ICD-10-CM | POA: Diagnosis not present

## 2018-06-04 DIAGNOSIS — R059 Cough, unspecified: Secondary | ICD-10-CM

## 2018-06-04 DIAGNOSIS — R0602 Shortness of breath: Secondary | ICD-10-CM

## 2018-06-04 MED ORDER — AMLODIPINE BESYLATE 5 MG PO TABS: 7.5000 mg | ORAL_TABLET | Freq: Every day | ORAL | 3 refills | Status: DC

## 2018-06-04 NOTE — Patient Instructions (Addendum)
INCREASE AMLODIPINE 7.5MG  (TAKE 1.5 TAB AMLODIPINE) DAILY - MAY HOLD AMLODIPINE IF PRESSURE < 110/60  GIVE LASIX DOSE TODAY OR TOMORROW  Will call with xray results  continue other medications as ordered  Follow up in 1 month with [name] for HTN and CHF.

## 2018-06-04 NOTE — Progress Notes (Signed)
Patient ID: Drew Gibson, male   DOB: 1929-04-08, 84 y.o.   MRN: [id number]   Location:  [facility] OFFICE  Provider: DR Arletha Grippe  Code Status:  Goals of Care:  Advanced Directives 04/25/2018  Does Patient Have a Medical Advance Directive? No  Type of Advance Directive -  Does patient want to make changes to medical advance directive? -  Copy of [place] in Chart? -  Would patient like information on creating a medical advance directive? No - Patient declined  Pre-existing out of facility DNR order (yellow form or pink MOST form) -     Chief Complaint  Patient presents with  . Medical Management of Chronic Issues    Follow up Blood Pressure.     HPI: Patient is a 84 y.o. male seen today for f/u HTN.  He was started on amlodipine 5mg  every afternoon on 05/15/18. He also takes metoprolol BID and lasix prn. BP 120-160s/60-70s with occasional SBP 110s and most high DBP 113.   This AM, he was in the bathroom, using the urinal and felt like he could not stand upright, and ended up sitting down on the floor to avoid falling. Fire department came and helped him up. Similar sx's have happened in the past.  He continues to have a bad cough. Weight down 2 lbs. O2 sats 92%. He has SOB with min exertion and uses 2 pillows at night.    Past Medical History:  Diagnosis Date  . Abnormality of gait   . Benign neoplasm of colon   . Benign prostate hyperplasia   . Chronic kidney disease, stage IV (severe) ([place])   . Closed fracture of ramus of right pubis ([place])   . Complication of anesthesia    postop headache after general anesthesia  . Degenerative arthritis   . Diverticulosis of colon (without mention of hemorrhage)   . Duodenal ulcer, unspecified as acute or chronic, without hemorrhage, perforation, or obstruction   . Encounter for long-term (current) use of other medications   . Enlarged prostate    takes Tamsulosin daily  . Essential and other specified forms of  tremor   . Foley catheter in place 09-01-13   4 months now  . Foot drop, bilateral   . Gait disorder    balance issues-uses walker or mobile chair.  . Gout    takes Allopurinol daily  . Gout, unspecified   . Hyperlipidemia    but doesn't require meds  . Hypertension    takes Enalapril daily   . Inguinal hernia without mention of obstruction or gangrene, unilateral or unspecified, (not specified as recurrent)   . Internal hemorrhoids without mention of complication   . Macular degeneration    takes eye cap vits daily  . Other abnormal blood chemistry   . PAD (peripheral artery disease) ([place])   . Peripheral neuropathy   . Peripheral neuropathy   . Pneumonia    hx of;last time 8'14-mild.  . Ptosis    Left side  . Spinal stenosis, lumbar region, with neurogenic claudication   . Syncope   . TIA (transient ischemic attack)    sees Dr.Willis-neurologist 6'14(strange feeling of head)-no residual  . Unspecified vitamin D deficiency   . Urinary frequency   . Urinary urgency   . UTI (lower urinary tract infection) 09-01-13   tx. 2 weeks ago    Past Surgical History:  Procedure Laterality Date  . CATARACT EXTRACTION Right 08/31/2014   Dr.Lyles  .  colonosocpy    . FINGER SURGERY Left    Left ring finger  . HERNIA REPAIR     at least 10yrs ago  . HIP PINNING,CANNULATED Left 05/05/2013   Procedure: CANNULATED HIP PINNING- left;  Surgeon: Renette Butters, MD;  Location: [facility];  Service: Orthopedics;  Laterality: Left;  . INGUINAL HERNIA REPAIR  09/05/2012   Procedure: LAPAROSCOPIC INGUINAL HERNIA;  Surgeon: Gayland Curry, MD,FACS;  Location: [facility];  Service: General;  Laterality: Left;  . INSERTION OF MESH  09/05/2012   Procedure: INSERTION OF MESH;  Surgeon: Gayland Curry, MD,FACS;  Location: [facility];  Service: General;  Laterality: Left;  . left leg surgery  2011  . NERVE BIOPSY    . TRANSURETHRAL RESECTION OF PROSTATE N/A 09/07/2013   Procedure: CYSTO TRANSURETHRAL RESECTION OF THE  PROSTATE (TURP);  Surgeon: Reece Packer, MD;  Location: [facility];  Service: Urology;  Laterality: N/A;     reports that he quit smoking about 33 years ago. His smoking use included cigarettes. He quit smokeless tobacco use about 17 years ago. He reports that he does not drink alcohol or use drugs. Social History   Socioeconomic History  . Marital status: Married    Spouse name: Not on file  . Number of children: 2  . Years of education: 98  . Highest education level: Not on file  Occupational History  . 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 Financial resource strain: Not on file  . Food insecurity:    Worry: Not on file    Inability: Not on file  . Transportation needs:    Medical: Not on file    Non-medical: Not on file  Tobacco Use  . Smoking status: Former Smoker    Types: Cigarettes    Last attempt to quit: 05/10/1985    Years since quitting: 33.0  . Smokeless tobacco: Former Systems developer    Quit date: 02/07/2001  . Tobacco comment: quit 30 years ago  Substance and Sexual Activity  . Alcohol use: No    Alcohol/week: 0.0 standard drinks  . Drug use: No  . Sexual activity: Not Currently  Lifestyle  . Physical activity:    Days per week: Not on file    Minutes per session: Not on file  . Stress: Not on file  Relationships  . Social connections:    Talks on phone: Not on file    Gets together: Not on file    Attends religious service: Not on file    Active member of club or organization: Not on file    Attends meetings of clubs or organizations: Not on file    Relationship status: Not on file  . Intimate partner violence:    Fear of current or ex partner: Not on file    Emotionally abused: Not on file    Physically abused: Not on file    Forced sexual activity: Not on file  Other Topics Concern  . Not on file  Social History Narrative   Lives with wife Inez Catalina   Former smoker stopped 1986   Alcohol none   Exercise none    Family History    Problem Relation Age of Onset  . Diabetes Mother        type 2  . Clotting disorder Mother        deceased, blood clot  . Leukemia Father   . COPD Brother   . Lung cancer Sister   .  Heart disease Brother        1/2 brother    Allergies  Allergen Reactions  . Aleve [Naproxen Sodium] Other (See Comments)    Upset stomach   . Antihistamines, Diphenhydramine-Type Other (See Comments)    Causes urinary issues  . Dimenhydrinate     Causes urinary issues Causes urinary issues  . Nsaids     upset stomach  . Tolmetin     Bother his stomach    Outpatient Encounter Medications as of 06/04/2018  Medication Sig  . acetaminophen (TYLENOL) 500 MG tablet Take 500-1,000 mg by mouth every 6 (six) hours as needed.  Marland Kitchen allopurinol (ZYLOPRIM) 100 MG tablet Take 200 mg by mouth daily.   Marland Kitchen amLODipine (NORVASC) 5 MG tablet Take 1 tablet (5 mg total) by mouth daily. At lunch  . b complex vitamins capsule Take 1 capsule by mouth daily.   . Cholecalciferol (VITAMIN D3) 2000 UNITS TABS Take 2,000 Units by mouth daily.   . clopidogrel (PLAVIX) 75 MG tablet TAKE 1 TABLET BY MOUTH ONCE DAILY FOR  ANTICOAGULATION  . finasteride (PROSCAR) 5 MG tablet TAKE 1 TABLET BY MOUTH ONCE DAILY TO  HELP  SHRINK  PROSTATE  . furosemide (LASIX) 20 MG tablet Take 1 tablet (20 mg total) by mouth daily as needed for edema.  . metoprolol tartrate (LOPRESSOR) 25 MG tablet Take 1 tablet (25 mg total) by mouth 2 (two) times daily.  . primidone (MYSOLINE) 50 MG tablet TAKE 1 TABLET BY MOUTH ONCE DAILY IN THE EVENING TO HELP WITH TREMORS.  . [DISCONTINUED] OVER THE COUNTER MEDICATION Take 1 capsule by mouth at bedtime. VISION ALIGN    No facility-administered encounter medications on file as of 06/04/2018.     Review of Systems:  Review of Systems  Constitutional: Positive for fatigue.  Respiratory: Positive for shortness of breath.   Cardiovascular: Positive for leg swelling.  Musculoskeletal: Positive for arthralgias and  gait problem.  Neurological: Positive for numbness and headaches.  All other systems reviewed and are negative.   Health Maintenance  Topic Date Due  . PNA vac Low Risk Adult (2 of 2 - PPSV23) 12/05/2017  . INFLUENZA VACCINE  05/01/2018  . TETANUS/TDAP  08/25/2026    Physical Exam: Vitals:   06/04/18 1257  BP: 118/68  Pulse: 84  Temp: 97.9 F (36.6 C)  TempSrc: Oral  SpO2: 92%  Weight: 166 lb 6.4 oz (75.5 kg)  Height: 5\' 8"  (1.727 m)   Body mass index is 25.3 kg/m. Physical Exam  Constitutional: He is oriented to person, place, and time. He appears well-developed.  Frail appearing in NAD  HENT:  Mouth/Throat: Oropharynx is clear and moist.  MMM; no oral thrush  Eyes: Pupils are equal, round, and reactive to light. No scleral icterus.  Neck: Neck supple. Carotid bruit is not present. No thyromegaly present.  Cardiovascular: Normal rate, regular rhythm and intact distal pulses. Exam reveals no gallop and no friction rub.  Murmur (1/6 SEM) heard. Trace LE edema b/l; no calf TTP  Pulmonary/Chest: Effort normal. He has decreased breath sounds. He has no wheezes. He has rales (b/l at base). He exhibits no tenderness.  Abdominal: Soft. Bowel sounds are normal. He exhibits no distension, no abdominal bruit, no pulsatile midline mass and no mass. There is no hepatomegaly. There is no tenderness. There is no rebound and no guarding.  Musculoskeletal: He exhibits edema.  Lymphadenopathy:    He has no cervical adenopathy.  Neurological: He is alert and  oriented to person, place, and time. He has normal reflexes.  Skin: Skin is warm and dry. No rash noted.  Psychiatric: He has a normal mood and affect. His behavior is normal. Judgment and thought content normal.    Labs reviewed: Basic Metabolic Panel: Recent Labs    03/27/18 1435 03/28/18 0628 03/30/18 0444  NA 140 140 139  K 4.4 4.6 5.0  CL 105 107 107  CO2 24 26 24   GLUCOSE 158* 101* 99  BUN 48* 44* 56*  CREATININE  2.46* 2.14* 2.31*  CALCIUM 8.8* 8.9 8.7*  TSH  --  2.130  --    Liver Function Tests: Recent Labs    06/12/17 1448 12/31/17 1219 02/26/18 1215  AST 15 19 24   ALT 12 19 30   BILITOT 0.3 0.5 0.5  PROT 6.3 6.5 6.4   No results for input(s): LIPASE, AMYLASE in the last 8760 hours. No results for input(s): AMMONIA in the last 8760 hours. CBC: Recent Labs    06/12/17 1448 12/31/17 1219 03/27/18 1435 03/28/18 0628  WBC 7.2 6.9 7.5 6.7  NEUTROABS 3,629 3,933  --   --   HGB 13.2 13.9 12.9* 12.8*  HCT 39.1 40.2 42.1 40.7  MCV 95.8 95.0 105.0* 103.3*  PLT 161 163 113* 112*   Lipid Panel: No results for input(s): CHOL, HDL, LDLCALC, TRIG, CHOLHDL, LDLDIRECT in the last 8760 hours. No results found for: HGBA1C  Procedures since last visit: No results found.  Assessment/Plan   ICD-10-CM   1. Cough R05 DG Chest 2 View  2. SOB (shortness of breath) R06.02 DG Chest 2 View  3. Essential hypertension I10 amLODipine (NORVASC) 5 MG tablet  4. Chronic diastolic CHF (congestive heart failure), NYHA class 2 (HCC) I50.32    INCREASE AMLODIPINE 7.5MG  (TAKE 1.5 TAB AMLODIPINE) DAILY - MAY HOLD AMLODIPINE IF PRESSURE < 110/60  GIVE LASIX DOSE TODAY OR TOMORROW  Will call with xray results  continue other medications as ordered  Follow up in 1 month with [name] for HTN and CHF.     Muranda Coye S. Perlie Gold  [facility] [address] [place], [place] [address] [phone] Cell (Monday-Friday 8 AM - 5 PM) [phone] After 5 PM and follow prompts

## 2018-06-05 ENCOUNTER — Ambulatory Visit: Payer: Medicare Other | Admitting: Family

## 2018-06-05 ENCOUNTER — Telehealth: Payer: Self-pay | Admitting: Neurology

## 2018-06-05 ENCOUNTER — Ambulatory Visit: Payer: Medicare Other | Admitting: Internal Medicine

## 2018-06-05 NOTE — Telephone Encounter (Signed)
I have received blood work results from his nephrologist done on 27 May 2018.  BUN is 48, creatinine of 1.95, estimated GFR is 35.  Sodium 132, potassium 5.3, chloride 100, CO2 24, calcium 8.8, total protein 6.8, albumin 3.6, liver profile is unremarkable with exception of a slightly elevated alkaline phosphatase of 160.  White blood count of 6.9, hemoglobin of 13.3, hematocrit 39.8, MCV of 96, platelets of 176.  The patient has a IgA monoclonal protein with kappa light chain.

## 2018-06-06 ENCOUNTER — Telehealth: Payer: Self-pay

## 2018-06-06 NOTE — Telephone Encounter (Signed)
FYI, Occupational Therapist with [facility] called to inform Dr.Reed patient with missed visit due to not feeling well. Patient postponed visit to next week

## 2018-06-10 ENCOUNTER — Other Ambulatory Visit (HOSPITAL_COMMUNITY): Payer: Self-pay | Admitting: Nephrology

## 2018-06-10 DIAGNOSIS — I10 Essential (primary) hypertension: Secondary | ICD-10-CM

## 2018-06-10 DIAGNOSIS — N181 Chronic kidney disease, stage 1: Secondary | ICD-10-CM

## 2018-06-11 ENCOUNTER — Ambulatory Visit (HOSPITAL_COMMUNITY)
Admission: RE | Admit: 2018-06-11 | Discharge: 2018-06-11 | Disposition: A | Discharge status: Home / Self Care | Payer: Medicare Other | Source: Ambulatory Visit | Attending: Cardiology | Admitting: Cardiology

## 2018-06-11 DIAGNOSIS — N181 Chronic kidney disease, stage 1: Secondary | ICD-10-CM

## 2018-06-11 DIAGNOSIS — I10 Essential (primary) hypertension: Secondary | ICD-10-CM

## 2018-06-13 ENCOUNTER — Telehealth: Payer: Self-pay

## 2018-06-13 NOTE — Telephone Encounter (Signed)
Drew Gibson verbalized understanding. She did not have any additional questions at this time.

## 2018-06-13 NOTE — Telephone Encounter (Signed)
Mrs. Onorato called to clarify the instructions that were given to her about patient's medications during office visit with Dr. Eulas Post on 06/04/18. Mrs. Adamik stated that patient's blood pressure was 104/60 in left arm and 101/65 in right arm today and she could not remember what she was told to do about holding amlodipine.   Per Dr. Vale Haven note on 06/04/18- patient was to increase amlodipine to 7.5 mg daily but may hold amlodipine if BP is less than 110/60. Mrs. Quizon verbalized understanding of that instruction. She then asked if she should be holding patient's metoprolol 25 mg BID for any BP below 110/60. She was informed that the OV note only said to hold the amlodipine. She stated that she has been holding the metoprolol and wanted to make sure that was ok with provider. She was told that a message would be sent to Dr. Mariea Clonts.   Please advise if patient should be holding amlodipine and metoprolol for blood pressure below 110/60

## 2018-06-13 NOTE — Telephone Encounter (Signed)
Yes, both amlodipine and metoprolol should be held for bp <110/60, but she should let us know if she has to hold the medications on a regular basis because it means there needs to be a permanent change made.

## 2018-06-19 ENCOUNTER — Other Ambulatory Visit: Payer: Self-pay | Admitting: Nurse Practitioner

## 2018-06-19 DIAGNOSIS — R972 Elevated prostate specific antigen [PSA]: Secondary | ICD-10-CM

## 2018-06-24 ENCOUNTER — Telehealth: Payer: Self-pay | Admitting: Internal Medicine

## 2018-06-24 NOTE — Telephone Encounter (Signed)
S/w Dr Liston Alba of [facility] regarding continuity of care. He was seen in his home last week for f/c. No etiology found. Informed Dr Zigmund Daniel that recent CXR revealed min b/l pleural effusions. He had a 2 D echo in June 2019 that revealed moderate pulmonary HTN and severely dilated LA, EF nml. [facility] will be faxing report to office. Pt will have flu shot as long as medically stable at next [employer] in Oct 2019.

## 2018-06-27 ENCOUNTER — Ambulatory Visit: Payer: Medicare Other | Admitting: Internal Medicine

## 2018-06-27 ENCOUNTER — Encounter: Payer: Self-pay | Admitting: Internal Medicine

## 2018-06-27 VITALS — BP 130/78 | HR 70 | Ht 68.0 in | Wt 177.0 lb

## 2018-06-27 DIAGNOSIS — I701 Atherosclerosis of renal artery: Secondary | ICD-10-CM | POA: Insufficient documentation

## 2018-06-27 DIAGNOSIS — I5032 Chronic diastolic (congestive) heart failure: Secondary | ICD-10-CM | POA: Diagnosis not present

## 2018-06-27 DIAGNOSIS — I739 Peripheral vascular disease, unspecified: Secondary | ICD-10-CM

## 2018-06-27 DIAGNOSIS — I10 Essential (primary) hypertension: Secondary | ICD-10-CM

## 2018-06-27 DIAGNOSIS — N184 Chronic kidney disease, stage 4 (severe): Secondary | ICD-10-CM

## 2018-06-27 NOTE — Patient Instructions (Addendum)
Medication Instructions:  Your physician recommends that you continue on your current medications as directed. Please refer to the Current Medication list given to you today.  -- If you need a refill on your cardiac medications before your next appointment, please call your pharmacy. --  Labwork: None ordered  Testing/Procedures: Scheduled In 3 MONTHS Your physician has requested that you have a renal artery duplex. During this test, an ultrasound is used to evaluate blood flow to the kidneys. Allow one hour for this exam. Do not eat after midnight the day before and avoid carbonated beverages. Take your medications as you usually do.   Follow-Up: Your physician wants you to follow-up after Renal Doppler with Dr. Fletcher Anon or Gwenlyn Found    Thank you for choosing [facility]!!    Any Other Special Instructions Will Be Listed Below (If Applicable).

## 2018-06-27 NOTE — Progress Notes (Signed)
New Outpatient Visit Date: 06/27/2018  Referring Provider: [name], MD [facility]  Chief Complaint: Renal artery stenosis  HPI:  Drew Gibson is a 84 y.o. male who is being seen today for the evaluation of renal artery stenosis at the request of Dr. Marval Regal. He has a history of hypertension, hyperlipidemia, chronic kidney disease stage IV with recently noted bilateral renal artery stenosis, peripheral vascular disease, TIA, and syncope.  Today, Drew Gibson reports feeling relatively well.  His biggest complaint is weakness involving his legs secondary to chronic neuropathy.  He has stable exertional dyspnea when walking about 75 feet.  He at times will ambulate with a rolling walker, though he is often moves about with a wheelchair.  Drew Gibson has a long history of moderate chronic kidney disease that initially was monitored by his PCP and more recently Dr. Marval Regal.  Drew Gibson was treated with enalapril many years ago, though this was decreased in the setting of acute kidney injury while hospitalized.  His baseline creatinine had been 1.6-1.9, though earlier this year it trended up to 2.5 in the setting of fall and hospitalization that was complicated by acute diastolic heart failure.  Most recent creatinine a month ago was 1.95.  Drew Gibson reports reasonable urine output and minimal dependent edema.  He reports stable 2 pillow orthopnea.  In June, Drew Gibson lost his balance and fell, leading to a nondisplaced pelvic fracture.  During his hospitalization he had shortness of breath and was noted to have diastolic heart failure with moderate pulmonary hypertension by echo.  He was diuresed with improvement in his symptoms.  Elevated blood pressures were also diagnosed around that time.  He had been given a diagnosis of hypertension many years ago but had not been on any therapy.  Over the last 2 to 3 months, he has been started on Toprol and amlodipine with improvement in his  blood pressure.  He has not had any significant elevations over the last week.  Of note, Drew Gibson has had multiple falls over the last year and has also suffered bilateral hip fractures in the past.  --------------------------------------------------------------------------------------------------  Cardiovascular History & Procedures: Cardiovascular Problems:  Renal artery stenosis  PAD  Risk Factors:  PAD, hypertension, hyperlipidemia, TIA, male gender, and age greater than 49  Cath/PCI:  None  CV Surgery:  None  EP Procedures and Devices:  None  Non-Invasive Evaluation(s):  Renal artery Doppler (06/11/2018): Bilateral renal cysts.  Greater than 60% stenosis in both renal arteries.  Echocardiogram (03/28/2018): Normal LV size with moderate LVH.  LVEF 55-60% with normal wall motion.  Grade 2 diastolic dysfunction.  Severe left atrial enlargement.  Moderate pulmonary hypertension (PASP 60 mmHg).  Normal RV size and function.  ABIs (03/27/2017): Moderate bilateral lower extremity PAD.  Right ABI/TBI: 0.61/0.35.  Left ABI/TBI 0.46/0.23.  Recent CV Pertinent Labs: Lab Results  Component Value Date   CHOL 279 (H) 12/03/2016   CHOL 286 (H) 09/27/2015   HDL 62 12/03/2016   HDL 55 09/27/2015   LDLCALC 195 (H) 12/03/2016   LDLCALC 207 (H) 09/27/2015   TRIG 109 12/03/2016   CHOLHDL 4.5 12/03/2016   INR 0.99 05/04/2013   K 5.0 03/30/2018   BUN 56 (H) 03/30/2018   BUN 40 (H) 02/06/2016   CREATININE 2.31 (H) 03/30/2018   CREATININE 2.00 (H) 03/05/2018    --------------------------------------------------------------------------------------------------  Past Medical History:  Diagnosis Date  . Abnormality of gait   . Benign neoplasm of colon   .  Benign prostate hyperplasia   . Chronic kidney disease, stage IV (severe) ([place])   . Closed fracture of ramus of right pubis ([place])   . Complication of anesthesia    postop headache after general anesthesia  . Degenerative  arthritis   . Diverticulosis of colon (without mention of hemorrhage)   . Duodenal ulcer, unspecified as acute or chronic, without hemorrhage, perforation, or obstruction   . Encounter for long-term (current) use of other medications   . Enlarged prostate    takes Tamsulosin daily  . Essential and other specified forms of tremor   . Foley catheter in place 09-01-13   4 months now  . Foot drop, bilateral   . Gait disorder    balance issues-uses walker or mobile chair.  . Gout    takes Allopurinol daily  . Gout, unspecified   . Hyperlipidemia    but doesn't require meds  . Hypertension    takes Enalapril daily   . Inguinal hernia without mention of obstruction or gangrene, unilateral or unspecified, (not specified as recurrent)   . Internal hemorrhoids without mention of complication   . Macular degeneration    takes eye cap vits daily  . Other abnormal blood chemistry   . PAD (peripheral artery disease) ([place])   . Peripheral neuropathy   . Peripheral neuropathy   . Pneumonia    hx of;last time 8'14-mild.  . Ptosis    Left side  . Spinal stenosis, lumbar region, with neurogenic claudication   . Syncope   . TIA (transient ischemic attack)    sees Dr.Willis-neurologist 6'14(strange feeling of head)-no residual  . Unspecified vitamin D deficiency   . Urinary frequency   . Urinary urgency   . UTI (lower urinary tract infection) 09-01-13   tx. 2 weeks ago    Past Surgical History:  Procedure Laterality Date  . CATARACT EXTRACTION Right 08/31/2014   Dr.Lyles  . colonosocpy    . FINGER SURGERY Left    Left ring finger  . HERNIA REPAIR     at least 3yrs ago  . HIP PINNING,CANNULATED Left 05/05/2013   Procedure: CANNULATED HIP PINNING- left;  Surgeon: Renette Butters, MD;  Location: [facility];  Service: Orthopedics;  Laterality: Left;  . INGUINAL HERNIA REPAIR  09/05/2012   Procedure: LAPAROSCOPIC INGUINAL HERNIA;  Surgeon: Gayland Curry, MD,FACS;  Location: [facility];  Service:  General;  Laterality: Left;  . INSERTION OF MESH  09/05/2012   Procedure: INSERTION OF MESH;  Surgeon: Gayland Curry, MD,FACS;  Location: [facility];  Service: General;  Laterality: Left;  . left leg surgery  2011  . NERVE BIOPSY    . TRANSURETHRAL RESECTION OF PROSTATE N/A 09/07/2013   Procedure: CYSTO TRANSURETHRAL RESECTION OF THE PROSTATE (TURP);  Surgeon: Reece Packer, MD;  Location: [facility];  Service: Urology;  Laterality: N/A;    Current Meds  Medication Sig  . acetaminophen (TYLENOL) 500 MG tablet Take 500-1,000 mg by mouth every 6 (six) hours as needed.  Marland Kitchen allopurinol (ZYLOPRIM) 100 MG tablet Take 200 mg by mouth daily.   Marland Kitchen amLODipine (NORVASC) 5 MG tablet Take 1.5 tablets (7.5 mg total) by mouth daily. At lunch  . b complex vitamins capsule Take 1 capsule by mouth daily.   . Cholecalciferol (VITAMIN D3) 2000 UNITS TABS Take 2,000 Units by mouth daily.   . clopidogrel (PLAVIX) 75 MG tablet TAKE 1 TABLET BY MOUTH ONCE DAILY FOR  ANTICOAGULATION  . finasteride (PROSCAR) 5 MG tablet  TAKE 1 TABLET BY MOUTH ONCE DAILY TO HELP SHRINK PROSTATE.  . furosemide (LASIX) 20 MG tablet Take 1 tablet (20 mg total) by mouth daily as needed for edema. (Patient taking differently: Take 20 mg by mouth as needed for fluid or edema. )  . metoprolol tartrate (LOPRESSOR) 25 MG tablet Take 1 tablet (25 mg total) by mouth 2 (two) times daily.  . Multiple Vitamins-Minerals (PRESERVISION AREDS 2 PO) Take 1 tablet by mouth daily.  . primidone (MYSOLINE) 50 MG tablet TAKE 1 TABLET BY MOUTH ONCE DAILY IN THE EVENING TO HELP WITH TREMORS.    Allergies: Aleve [naproxen sodium]; Antihistamines, diphenhydramine-type; Dimenhydrinate; Nsaids; and Tolmetin  Social History   Tobacco Use  . Smoking status: Former Smoker    Packs/day: 1.00    Years: 40.00    Pack years: 40.00    Types: Cigarettes    Last attempt to quit: 05/10/1985    Years since quitting: 33.1  . Smokeless tobacco: Former Systems developer    Quit date:  02/07/2001  . Tobacco comment: quit 30 years ago  Substance Use Topics  . Alcohol use: No    Alcohol/week: 0.0 standard drinks  . Drug use: No    Family History  Problem Relation Age of Onset  . Diabetes Mother        type 2  . Clotting disorder Mother        deceased, blood clot  . Leukemia Father   . COPD Brother   . Lung cancer Sister   . Heart disease Brother        1/2 brother  . COPD Brother     Review of Systems: Review of Systems  Constitutional: Positive for diaphoresis.  HENT: Positive for hearing loss.   Eyes: Negative.   Respiratory: Positive for cough and shortness of breath (With activity).   Cardiovascular: Positive for orthopnea and leg swelling.  Gastrointestinal: Negative.   Genitourinary: Negative.   Musculoskeletal: Positive for falls.  Neurological: Positive for sensory change and headaches.  Endo/Heme/Allergies: Bruises/bleeds easily.  Psychiatric/Behavioral: Negative.    --------------------------------------------------------------------------------------------------  Physical Exam: BP 130/78   Pulse 70   Ht 5\' 8"  (1.727 m)   Wt 177 lb (80.3 kg)   SpO2 93%   BMI 26.91 kg/m   General: Elderly man, seated in a wheelchair.  He is accompanied by his wife. HEENT: No conjunctival pallor or scleral icterus. Moist mucous membranes. OP clear. Neck: Supple without lymphadenopathy, thyromegaly, JVD, or HJR. Lungs: Normal work of breathing. Clear to auscultation bilaterally without wheezes or crackles. Heart: Regular rate and rhythm with 1/6 systolic murmur loudest at the left lower sternal border.  No rubs or gallops. Abd: Bowel sounds present. Soft, NT/ND without hepatosplenomegaly Ext: No lower extremity edema.  2+ radial pulses.  Pedal pulses are not palpable bilaterally. Skin: Warm and dry without rash. Neuro: CNIII-XII intact. Strength and fine-touch sensation intact in upper and lower extremities bilaterally. Psych: Normal mood and  affect.  EKG: Normal sinus rhythm with first-degree AV block (PR interval 264 ms).  Otherwise, no significant abnormalities.  Lab Results  Component Value Date   WBC 6.7 03/28/2018   HGB 12.8 (L) 03/28/2018   HCT 40.7 03/28/2018   MCV 103.3 (H) 03/28/2018   PLT 112 (L) 03/28/2018    Lab Results  Component Value Date   NA 139 03/30/2018   K 5.0 03/30/2018   CL 107 03/30/2018   CO2 24 03/30/2018   BUN 56 (H) 03/30/2018   CREATININE 2.31 (  H) 03/30/2018   GLUCOSE 99 03/30/2018   ALT 30 02/26/2018    Lab Results  Component Value Date   CHOL 279 (H) 12/03/2016   HDL 62 12/03/2016   LDLCALC 195 (H) 12/03/2016   TRIG 109 12/03/2016   CHOLHDL 4.5 12/03/2016     --------------------------------------------------------------------------------------------------  ASSESSMENT AND PLAN: Renal artery stenosis and chronic kidney disease stage IV Recent renal artery Doppler showed greater than 60% renal artery stenoses involving both kidneys.  Drew Gibson has a long history of chronic kidney disease that has gradually progressed over many years.  Most recent baseline has been 1.6-1.9, with most recent creatinine level at 1.95 a month ago.  Blood pressure is well controlled today and seems to be reasonable at home as well with current regimen of metoprolol and amlodipine.  We have discussed abdominal aortography and renal angiogram versus conservative therapy.  I am concerned that renal angiography even with minimal IV contrast poses a significant risk for contrast-induced nephropathy.  Given his frailty and other comorbidities, Drew Gibson would like to defer any invasive procedures at this time.  Given that his renal function has been fairly stable and his blood pressure now under control, I agree with conservative therapy.  We will plan to repeat a renal artery Doppler in about 3 months to see if his velocities have increased.  Should he develop labile hypertension or progressive rise in his  creatinine, renal angiography and intervention would need to be readdressed.  I have recommended statin therapy given PAD involving the renal arteries and lower extremities, as well as history of TIA and LDL of 195 in 11/2016.  However, Drew Gibson is concerned about side effects and declines at this time.  Chronic diastolic heart failure Drew Gibson appears grossly euvolemic on exam today.  He has chronic exertional dyspnea when walking short distances consistent with NYHA class III heart failure.  I have encouraged him to monitor his edema and weight at home and to continue taking furosemide on an as-needed basis  We will defer invasive testing at the patient's request.  Hypertension Blood pressure normal today.  Continue metoprolol and amlodipine.  Peripheral arterial disease No claudication reported.  Pedal pulses are absent on exam today.  Prior ABIs showed moderate bilateral lower extremity disease.  Given the lack of symptoms and minimal mobility at baseline, we will defer testing at this time.  Follow-up: Given my transition to the [place] office, I will have Drew Gibson follow-up with Dr. Fletcher Anon or Dr. Gwenlyn Found in 3 months.  Nelva Bush, MD 06/27/2018 10:09 PM

## 2018-07-01 ENCOUNTER — Encounter: Payer: Self-pay | Admitting: Neurology

## 2018-07-01 ENCOUNTER — Ambulatory Visit: Payer: Medicare Other | Admitting: Neurology

## 2018-07-01 VITALS — BP 121/70 | HR 74 | Ht 68.0 in | Wt 159.0 lb

## 2018-07-01 DIAGNOSIS — R269 Unspecified abnormalities of gait and mobility: Secondary | ICD-10-CM | POA: Diagnosis not present

## 2018-07-01 DIAGNOSIS — G63 Polyneuropathy in diseases classified elsewhere: Secondary | ICD-10-CM | POA: Diagnosis not present

## 2018-07-01 DIAGNOSIS — G4489 Other headache syndrome: Secondary | ICD-10-CM | POA: Diagnosis not present

## 2018-07-01 NOTE — Progress Notes (Signed)
Reason for visit: Peripheral neuropathy  Drew Gibson is an 84 y.o. male  History of present illness:  Drew Gibson is an 84 year old right-handed white male with a history of a severe peripheral neuropathy with a gait disorder.  The patient has weakness of the hands as well as the feet, he has just recently started wearing his AFO braces with some benefit.  The patient unfortunately fell on 26 March 2018 requiring brief hospitalization, he fractured his pelvis.  The patient had a syncopal episode the next day and required evaluation for that.  The patient has been noted to have stage IV renal disease, he is being followed through Dr. Marval Regal.  The patient has been found to have bilateral renal artery stenosis.  He has had issues with blood pressure, the Norvasc has been increased to 7.5 mg daily.  Before the fall in June, the patient began having left frontotemporal headaches that would come and go a couple times a week, he takes Tylenol with this with good improvement.  He feels that stress may bring on the headache.  The patient has not had any further falls, he is using a walker or a wheelchair to get around.  Past Medical History:  Diagnosis Date  . Abnormality of gait   . Benign neoplasm of colon   . Benign prostate hyperplasia   . Chronic kidney disease, stage IV (severe) ([place])   . Closed fracture of ramus of right pubis ([place])   . Complication of anesthesia    postop headache after general anesthesia  . Degenerative arthritis   . Diverticulosis of colon (without mention of hemorrhage)   . Duodenal ulcer, unspecified as acute or chronic, without hemorrhage, perforation, or obstruction   . Encounter for long-term (current) use of other medications   . Enlarged prostate    takes Tamsulosin daily  . Essential and other specified forms of tremor   . Foley catheter in place 09-01-13   4 months now  . Foot drop, bilateral   . Gait disorder    balance issues-uses walker or mobile  chair.  . Gout    takes Allopurinol daily  . Gout, unspecified   . Hyperlipidemia    but doesn't require meds  . Hypertension    takes Enalapril daily   . Inguinal hernia without mention of obstruction or gangrene, unilateral or unspecified, (not specified as recurrent)   . Internal hemorrhoids without mention of complication   . Macular degeneration    takes eye cap vits daily  . Other abnormal blood chemistry   . PAD (peripheral artery disease) ([place])   . Peripheral neuropathy   . Peripheral neuropathy   . Pneumonia    hx of;last time 8'14-mild.  . Ptosis    Left side  . Spinal stenosis, lumbar region, with neurogenic claudication   . Syncope   . TIA (transient ischemic attack)    sees Dr.Tytionna Cloyd-neurologist 6'14(strange feeling of head)-no residual  . Unspecified vitamin D deficiency   . Urinary frequency   . Urinary urgency   . UTI (lower urinary tract infection) 09-01-13   tx. 2 weeks ago    Past Surgical History:  Procedure Laterality Date  . CATARACT EXTRACTION Right 08/31/2014   Dr.Lyles  . colonosocpy    . FINGER SURGERY Left    Left ring finger  . HERNIA REPAIR     at least 23yrs ago  . HIP PINNING,CANNULATED Left 05/05/2013   Procedure: CANNULATED HIP PINNING- left;  Surgeon: Christia Reading  Maryla Morrow, MD;  Location: [facility];  Service: Orthopedics;  Laterality: Left;  . INGUINAL HERNIA REPAIR  09/05/2012   Procedure: LAPAROSCOPIC INGUINAL HERNIA;  Surgeon: Gayland Curry, MD,FACS;  Location: [facility];  Service: General;  Laterality: Left;  . INSERTION OF MESH  09/05/2012   Procedure: INSERTION OF MESH;  Surgeon: Gayland Curry, MD,FACS;  Location: [facility];  Service: General;  Laterality: Left;  . left leg surgery  2011  . NERVE BIOPSY    . TRANSURETHRAL RESECTION OF PROSTATE N/A 09/07/2013   Procedure: CYSTO TRANSURETHRAL RESECTION OF THE PROSTATE (TURP);  Surgeon: Reece Packer, MD;  [facility];  Service: Urology;  Laterality: N/A;    Family History  Problem Relation  Age of Onset  . Diabetes Mother        type 2  . Clotting disorder Mother        deceased, blood clot  . Leukemia Father   . COPD Brother   . Lung cancer Sister   . Heart disease Brother        1/2 brother  . COPD Brother     Social history:  reports that he quit smoking about 33 years ago. His smoking use included cigarettes. He has a 40.00 pack-year smoking history. He quit smokeless tobacco use about 17 years ago. He reports that he does not drink alcohol or use drugs.    Allergies  Allergen Reactions  . Aleve [Naproxen Sodium] Other (See Comments)    Upset stomach   . Antihistamines, Diphenhydramine-Type Other (See Comments)    Causes urinary issues  . Dimenhydrinate Other (See Comments)    Causes urinary issues Causes urinary issues Causes urinary issues Causes urinary issues  . Nsaids     upset stomach  . Tolmetin Other (See Comments)    Bother his stomach Bother his stomach    Medications:  Prior to Admission medications   Medication Sig Start Date End Date Taking? Authorizing Provider  acetaminophen (TYLENOL) 500 MG tablet Take 500-1,000 mg by mouth every 6 (six) hours as needed.   Yes [provider]  allopurinol (ZYLOPRIM) 100 MG tablet Take 200 mg by mouth daily.    Yes [provider]  amLODipine (NORVASC) 5 MG tablet Take 1.5 tablets (7.5 mg total) by mouth daily. At lunch 06/04/18  Yes Eulas Post, [place], DO  b complex vitamins capsule Take 1 capsule by mouth daily.    Yes [provider]  Cholecalciferol (VITAMIN D3) 2000 UNITS TABS Take 2,000 Units by mouth daily.    Yes [provider]  clopidogrel (PLAVIX) 75 MG tablet TAKE 1 TABLET BY MOUTH ONCE DAILY FOR  ANTICOAGULATION 02/25/18  Yes [name], NP  finasteride (PROSCAR) 5 MG tablet TAKE 1 TABLET BY MOUTH ONCE DAILY TO HELP SHRINK PROSTATE. 06/19/18  Yes [name], NP  furosemide (LASIX) 20 MG tablet Take 1 tablet (20 mg total) by mouth daily as needed for  edema. Patient taking differently: Take 20 mg by mouth as needed for fluid or edema.  03/26/18  Yes [name], NP  metoprolol tartrate (LOPRESSOR) 25 MG tablet Take 1 tablet (25 mg total) by mouth 2 (two) times daily. 03/26/18  Yes [name], NP  Multiple Vitamins-Minerals (PRESERVISION AREDS 2 PO) Take 1 tablet by mouth daily.   Yes [provider]  primidone (MYSOLINE) 50 MG tablet TAKE 1 TABLET BY MOUTH ONCE DAILY IN THE EVENING TO HELP WITH TREMORS. 09/13/17  Yes Eubanks,  Carlos American, NP    ROS:  Out of a complete 14 system review of symptoms, the patient complains only of the following symptoms, and all other reviewed systems are negative.  Blurred vision, macular degeneration Cough Leg swelling Daytime sleepiness, snoring Muscle cramps, walking difficulty  Blood pressure 121/70, pulse 74, height 5\' 8"  (1.727 m), weight 159 lb (72.1 kg), SpO2 96 %.  Physical Exam  General: The patient is alert and cooperative at the time of the examination.  Skin: No significant peripheral edema is noted.  Atrophy is seen in the muscles of the hands.  The patient has bilateral AFO braces.   Neurologic Exam  Mental status: The patient is alert and oriented x 3 at the time of the examination. The patient has apparent normal recent and remote memory, with an apparently normal attention span and concentration ability.   Cranial nerves: Facial symmetry is present. Speech is normal, no aphasia or dysarthria is noted. Extraocular movements are full. Visual fields are full.  Motor: The patient has good strength in all 4 extremities, with exception of prominent foot drops bilaterally and weakness of the intrinsic muscles of the hands bilaterally.  Sensory examination: Soft touch sensation is symmetric on the face, arms, and legs.  Coordination: The patient has good finger-nose-finger and heel-to-shin bilaterally.  Gait and station: The patient is able to stand with some  assistance, he is unable to effectively walk even with assistance.  Reflexes: Deep tendon reflexes are symmetric, but are depressed.   Assessment/Plan:  1.  Severe peripheral neuropathy  2.  Gait disturbance  3.  Left frontal headaches, new onset  The patient will be sent for blood work to include a sedimentation rate today.  If the headaches become more frequent or severe, a daily preventative medication may be added.  The patient will follow-up otherwise in 6 months.  Jill Alexanders MD 07/01/2018 11:01 AM  [facility] [address] [place], [place] [address]  Phone [phone] Fax [phone]

## 2018-07-02 ENCOUNTER — Telehealth: Payer: Self-pay | Admitting: Neurology

## 2018-07-02 ENCOUNTER — Ambulatory Visit: Payer: Medicare Other | Admitting: Nurse Practitioner

## 2018-07-02 LAB — SEDIMENTATION RATE: Sed Rate: 54 mm/hr — ABNORMAL HIGH (ref 0–30)

## 2018-07-02 NOTE — Telephone Encounter (Signed)
The sedimentation rate is elevated at 54.  I will recheck this again in 2 or 3 weeks.  We will check a C-reactive protein as well.  The patient has had headaches off and on since June 2019.

## 2018-07-06 ENCOUNTER — Other Ambulatory Visit: Payer: Self-pay | Admitting: Nurse Practitioner

## 2018-07-06 DIAGNOSIS — G250 Essential tremor: Secondary | ICD-10-CM

## 2018-07-10 ENCOUNTER — Telehealth: Payer: Self-pay | Admitting: *Deleted

## 2018-07-10 ENCOUNTER — Telehealth: Payer: Self-pay | Admitting: Nurse Practitioner

## 2018-07-10 NOTE — Telephone Encounter (Signed)
I called the patient to ask if he can come in earlier on 07/16/18 for AWV-S w/ Clarise Cruz.  I spoke with his wife who said it will be hard to get him up early enough to get there at 9:45. She actually thought his appt was at 10:30, but I explained that it's 10:15.  I told her that I would have to check with [name] to see if we can do the AWV after he sees her.    She also had questions about whether or not she should keep monitoring his blood pressure because it has been ranging under 150.  The other concern is that he starts to cramp like he needs to have a bowel movement.  When he goes to the bathroom, it turns out to be just gas.  I told her that I would pass this information along, and someone should call her back. VDM (DD)

## 2018-07-10 NOTE — Telephone Encounter (Signed)
It is okay to be done afterwards since we are just doing a follow up that day. If blood pressures have been good okay to stop monitoring. May want to take a food log and see what foods are giving him more gas

## 2018-07-10 NOTE — Telephone Encounter (Signed)
Patient wife, Inez Catalina called and stated that she had a couple of concerns:  1. Wife is taking blood pressure three times daily and she stated that the BP is stable. Wife is wondering if she can cut this back.   2. Patient has the urge to have BM but when he goes to the bathroom nothing happens. Wife stated that patient is not constipated and is pretty regular. When he does go its normal. Stated that this has been going on for months. Wife is wondering if any of his medication would give him the urge. Stated that she would give him a laxative but he is not constipated.   3. Requesting flu shot at next appointment on 10/16.  Please Advise.

## 2018-07-11 NOTE — Telephone Encounter (Signed)
Patient notified and agreed.  

## 2018-07-16 ENCOUNTER — Ambulatory Visit (INDEPENDENT_AMBULATORY_CARE_PROVIDER_SITE_OTHER): Payer: Medicare Other | Admitting: Nurse Practitioner

## 2018-07-16 ENCOUNTER — Encounter: Payer: Self-pay | Admitting: Nurse Practitioner

## 2018-07-16 ENCOUNTER — Ambulatory Visit (INDEPENDENT_AMBULATORY_CARE_PROVIDER_SITE_OTHER): Payer: Medicare Other

## 2018-07-16 VITALS — BP 134/78 | HR 94 | Temp 98.5°F | Ht 68.0 in | Wt 172.0 lb

## 2018-07-16 DIAGNOSIS — Z23 Encounter for immunization: Secondary | ICD-10-CM | POA: Diagnosis not present

## 2018-07-16 DIAGNOSIS — G4489 Other headache syndrome: Secondary | ICD-10-CM | POA: Diagnosis not present

## 2018-07-16 DIAGNOSIS — Z0000 Encounter for general adult medical examination without abnormal findings: Secondary | ICD-10-CM

## 2018-07-16 DIAGNOSIS — R143 Flatulence: Secondary | ICD-10-CM | POA: Diagnosis not present

## 2018-07-16 DIAGNOSIS — L89311 Pressure ulcer of right buttock, stage 1: Secondary | ICD-10-CM

## 2018-07-16 DIAGNOSIS — I10 Essential (primary) hypertension: Secondary | ICD-10-CM

## 2018-07-16 DIAGNOSIS — R972 Elevated prostate specific antigen [PSA]: Secondary | ICD-10-CM

## 2018-07-16 MED ORDER — FINASTERIDE 5 MG PO TABS: ORAL_TABLET | ORAL | 1 refills | Status: DC

## 2018-07-16 MED ORDER — ALLOPURINOL 100 MG PO TABS: 200.0000 mg | ORAL_TABLET | Freq: Every day | ORAL | 1 refills | Status: DC

## 2018-07-16 NOTE — Patient Instructions (Addendum)
Drew Gibson , Thank you for taking time to come for your Medicare Wellness Visit. I appreciate your ongoing commitment to your health goals. Please review the following plan we discussed and let me know if I can assist you in the future.   Screening recommendations/referrals: Colonoscopy excluded, over age 82 Recommended yearly ophthalmology/optometry visit for glaucoma screening and checkup Recommended yearly dental visit for hygiene and checkup  Vaccinations: Influenza vaccine given today, up to date Pneumococcal vaccine up to date, completed Tdap vaccine up to date, due 08/25/2026 Shingles vaccine due, please look into cost    Advanced directives: Please bring [name] a copy of your living will and health care power of attorney  Conditions/risks identified: none  Next appointment: [name], RN 07/20/2019 @ 11am  Preventive Care 65 Years and Older, Male Preventive care refers to lifestyle choices and visits with your health care provider that can promote health and wellness. What does preventive care include?  A yearly physical exam. This is also called an annual well check.  Dental exams once or twice a year.  Routine eye exams. Ask your health care provider how often you should have your eyes checked.  Personal lifestyle choices, including:  Daily care of your teeth and gums.  Regular physical activity.  Eating a healthy diet.  Avoiding tobacco and drug use.  Limiting alcohol use.  Practicing safe sex.  Taking low doses of aspirin every day.  Taking vitamin and mineral supplements as recommended by your health care provider. What happens during an annual well check? The services and screenings done by your health care provider during your annual well check will depend on your age, overall health, lifestyle risk factors, and family history of disease. Counseling  Your health care provider may ask you questions about your:  Alcohol use.  Tobacco use.  Drug  use.  Emotional well-being.  Home and relationship well-being.  Sexual activity.  Eating habits.  History of falls.  Memory and ability to understand (cognition).  Work and work Statistician. Screening  You may have the following tests or measurements:  Height, weight, and BMI.  Blood pressure.  Lipid and cholesterol levels. These may be checked every 5 years, or more frequently if you are over 73 years old.  Skin check.  Lung cancer screening. You may have this screening every year starting at age 84 if you have a 30-pack-year history of smoking and currently smoke or have quit within the past 15 years.  Fecal occult blood test (FOBT) of the stool. You may have this test every year starting at age 110.  Flexible sigmoidoscopy or colonoscopy. You may have a sigmoidoscopy every 5 years or a colonoscopy every 10 years starting at age 52.  Prostate cancer screening. Recommendations will vary depending on your family history and other risks.  Hepatitis C blood test.  Hepatitis B blood test.  Sexually transmitted disease (STD) testing.  Diabetes screening. This is done by checking your blood sugar (glucose) after you have not eaten for a while (fasting). You may have this done every 1-3 years.  Abdominal aortic aneurysm (AAA) screening. You may need this if you are a current or former smoker.  Osteoporosis. You may be screened starting at age 33 if you are at high risk. Talk with your health care provider about your test results, treatment options, and if necessary, the need for more tests. Vaccines  Your health care provider may recommend certain vaccines, such as:  Influenza vaccine. This is recommended every  year.  Tetanus, diphtheria, and acellular pertussis (Tdap, Td) vaccine. You may need a Td booster every 10 years.  Zoster vaccine. You may need this after age 61.  Pneumococcal 13-valent conjugate (PCV13) vaccine. One dose is recommended after age  11.  Pneumococcal polysaccharide (PPSV23) vaccine. One dose is recommended after age 51. Talk to your health care provider about which screenings and vaccines you need and how often you need them. This information is not intended to replace advice given to you by your health care provider. Make sure you discuss any questions you have with your health care provider. Document Released: 10/14/2015 Document Revised: 06/06/2016 Document Reviewed: 07/19/2015 Elsevier Interactive Patient Education  2017 [place] Prevention in the Home Falls can cause injuries. They can happen to people of all ages. There are many things you can do to make your home safe and to help prevent falls. What can I do on the outside of my home?  Regularly fix the edges of walkways and driveways and fix any cracks.  Remove anything that might make you trip as you walk through a door, such as a raised step or threshold.  Trim any bushes or trees on the path to your home.  Use bright outdoor lighting.  Clear any walking paths of anything that might make someone trip, such as rocks or tools.  Regularly check to see if handrails are loose or broken. Make sure that both sides of any steps have handrails.  Any raised decks and porches should have guardrails on the edges.  Have any leaves, snow, or ice cleared regularly.  Use sand or salt on walking paths during winter.  Clean up any spills in your garage right away. This includes oil or grease spills. What can I do in the bathroom?  Use night lights.  Install grab bars by the toilet and in the tub and shower. Do not use towel bars as grab bars.  Use non-skid mats or decals in the tub or shower.  If you need to sit down in the shower, use a plastic, non-slip stool.  Keep the floor dry. Clean up any water that spills on the floor as soon as it happens.  Remove soap buildup in the tub or shower regularly.  Attach bath mats securely with double-sided  non-slip rug tape.  Do not have throw rugs and other things on the floor that can make you trip. What can I do in the bedroom?  Use night lights.  Make sure that you have a light by your bed that is easy to reach.  Do not use any sheets or blankets that are too big for your bed. They should not hang down onto the floor.  Have a firm chair that has side arms. You can use this for support while you get dressed.  Do not have throw rugs and other things on the floor that can make you trip. What can I do in the kitchen?  Clean up any spills right away.  Avoid walking on wet floors.  Keep items that you use a lot in easy-to-reach places.  If you need to reach something above you, use a strong step stool that has a grab bar.  Keep electrical cords out of the way.  Do not use floor polish or wax that makes floors slippery. If you must use wax, use non-skid floor wax.  Do not have throw rugs and other things on the floor that can make you trip. What can  I do with my stairs?  Do not leave any items on the stairs.  Make sure that there are handrails on both sides of the stairs and use them. Fix handrails that are broken or loose. Make sure that handrails are as long as the stairways.  Check any carpeting to make sure that it is firmly attached to the stairs. Fix any carpet that is loose or worn.  Avoid having throw rugs at the top or bottom of the stairs. If you do have throw rugs, attach them to the floor with carpet tape.  Make sure that you have a light switch at the top of the stairs and the bottom of the stairs. If you do not have them, ask someone to add them for you. What else can I do to help prevent falls?  Wear shoes that:  Do not have high heels.  Have rubber bottoms.  Are comfortable and fit you well.  Are closed at the toe. Do not wear sandals.  If you use a stepladder:  Make sure that it is fully opened. Do not climb a closed stepladder.  Make sure that both  sides of the stepladder are locked into place.  Ask someone to hold it for you, if possible.  Clearly mark and make sure that you can see:  Any grab bars or handrails.  First and last steps.  Where the edge of each step is.  Use tools that help you move around (mobility aids) if they are needed. These include:  Canes.  Walkers.  Scooters.  Crutches.  Turn on the lights when you go into a dark area. Replace any light bulbs as soon as they burn out.  Set up your furniture so you have a clear path. Avoid moving your furniture around.  If any of your floors are uneven, fix them.  If there are any pets around you, be aware of where they are.  Review your medicines with your doctor. Some medicines can make you feel dizzy. This can increase your chance of falling. Ask your doctor what other things that you can do to help prevent falls. This information is not intended to replace advice given to you by your health care provider. Make sure you discuss any questions you have with your health care provider. Document Released: 07/14/2009 Document Revised: 02/23/2016 Document Reviewed: 10/22/2014 Elsevier Interactive Patient Education  2017 [employer].

## 2018-07-16 NOTE — Progress Notes (Signed)
Careteam: Patient Care Team: [name], NP as PCP - General (Geriatric Medicine) [name], MD as Consulting Physician (Nephrology)  Advanced Directive information    Allergies  Allergen Reactions  . Aleve [Naproxen Sodium] Other (See Comments)    Upset stomach   . Antihistamines, Diphenhydramine-Type Other (See Comments)    Causes urinary issues  . Dimenhydrinate Other (See Comments)    Causes urinary issues Causes urinary issues Causes urinary issues Causes urinary issues  . Nsaids     upset stomach  . Tolmetin Other (See Comments)    Bother his stomach Bother his stomach    Chief Complaint  Patient presents with  . Follow-up    Pt is being seen for a 1 month follow up visit.      HPI: Patient is a 84 y.o. male seen in the office today for follow up blood pressure.   Pt has been seen recently due to fluctuation in blood pressure with a lot of high readings. Norvasc was added and then increased to 7.5 mg at last OV. He is also taking metoprolol tartrate 25 mg BID. In the last few weeks blood pressure ranginging. [id number]  Nephrology did [name] of renal arteries which showed stenosis however cardiology did not recommend procedure to correct this due to age and co-morbidities.  Enjoyed visit Dr Arty Baumgartner  Bumped leg and wife has been using iodine alternating with vasaline which has almost healed it.   Occasional swelling to LE. Wearing compression hose   [employer] offering home visit from physicians, [facility] still can keep PCP but if there are any concern they can come out to the home.   Review of Systems:  Review of Systems  Constitutional: Negative for chills and fever.  HENT: Negative for congestion.   Eyes:       Glasses  Respiratory: Negative for cough, hemoptysis, sputum production, shortness of breath and wheezing.        With exertion "doing anything significant"  Cardiovascular: Negative for chest pain,  palpitations and leg swelling.       Edema better with compression hose  Gastrointestinal: Negative for abdominal pain and constipation.  Genitourinary: Negative for dysuria.  Musculoskeletal: Negative for falls.  Neurological: Negative for dizziness and headaches.    Past Medical History:  Diagnosis Date  . Abnormality of gait   . Benign neoplasm of colon   . Benign prostate hyperplasia   . Chronic kidney disease, stage IV (severe) ([place])   . Closed fracture of ramus of right pubis ([place])   . Complication of anesthesia    postop headache after general anesthesia  . Degenerative arthritis   . Diverticulosis of colon (without mention of hemorrhage)   . Duodenal ulcer, unspecified as acute or chronic, without hemorrhage, perforation, or obstruction   . Encounter for long-term (current) use of other medications   . Enlarged prostate    takes Tamsulosin daily  . Essential and other specified forms of tremor   . Foley catheter in place 09-01-13   4 months now  . Foot drop, bilateral   . Gait disorder    balance issues-uses walker or mobile chair.  . Gout    takes Allopurinol daily  . Gout, unspecified   . Hyperlipidemia    but doesn't require meds  . Hypertension    takes Enalapril daily   . Inguinal hernia without mention of obstruction or gangrene, unilateral or unspecified, (not specified as recurrent)   . Internal hemorrhoids  without mention of complication   . Macular degeneration    takes eye cap vits daily  . Other abnormal blood chemistry   . PAD (peripheral artery disease) ([place])   . Peripheral neuropathy   . Peripheral neuropathy   . Pneumonia    hx of;last time 8'14-mild.  . Ptosis    Left side  . Spinal stenosis, lumbar region, with neurogenic claudication   . Syncope   . TIA (transient ischemic attack)    sees Dr.Willis-neurologist 6'14(strange feeling of head)-no residual  . Unspecified vitamin D deficiency   . Urinary frequency   . Urinary urgency   . UTI  (lower urinary tract infection) 09-01-13   tx. 2 weeks ago   Past Surgical History:  Procedure Laterality Date  . CATARACT EXTRACTION Right 08/31/2014   Dr.Lyles  . colonosocpy    . FINGER SURGERY Left    Left ring finger  . HERNIA REPAIR     at least 14yrs ago  . HIP PINNING,CANNULATED Left 05/05/2013   Procedure: CANNULATED HIP PINNING- left;  Surgeon: Renette Butters, MD;  Location: [facility];  Service: Orthopedics;  Laterality: Left;  . INGUINAL HERNIA REPAIR  09/05/2012   Procedure: LAPAROSCOPIC INGUINAL HERNIA;  Surgeon: Gayland Curry, MD,FACS;  Location: [facility];  Service: General;  Laterality: Left;  . INSERTION OF MESH  09/05/2012   Procedure: INSERTION OF MESH;  Surgeon: Gayland Curry, MD,FACS;  Location: [facility];  Service: General;  Laterality: Left;  . left leg surgery  2011  . NERVE BIOPSY    . TRANSURETHRAL RESECTION OF PROSTATE N/A 09/07/2013   Procedure: CYSTO TRANSURETHRAL RESECTION OF THE PROSTATE (TURP);  Surgeon: Reece Packer, MD;  Location: [facility];  Service: Urology;  Laterality: N/A;   Social History:   reports that he quit smoking about 33 years ago. His smoking use included cigarettes. He has a 40.00 pack-year smoking history. He quit smokeless tobacco use about 17 years ago. He reports that he does not drink alcohol or use drugs.  Family History  Problem Relation Age of Onset  . Diabetes Mother        type 2  . Clotting disorder Mother        deceased, blood clot  . Leukemia Father   . COPD Brother   . Lung cancer Sister   . Heart disease Brother        1/2 brother  . COPD Brother     Medications: Patient's Medications  New Prescriptions   No medications on file  Previous Medications   ACETAMINOPHEN (TYLENOL) 500 MG TABLET    Take 500-1,000 mg by mouth every 6 (six) hours as needed.   ALLOPURINOL (ZYLOPRIM) 100 MG TABLET    Take 200 mg by mouth daily.    AMLODIPINE (NORVASC) 5 MG TABLET    Take 1.5 tablets (7.5 mg total) by mouth daily. At lunch   B  COMPLEX VITAMINS CAPSULE    Take 1 capsule by mouth daily.    CHOLECALCIFEROL (VITAMIN D3) 2000 UNITS TABS    Take 2,000 Units by mouth daily.    CLOPIDOGREL (PLAVIX) 75 MG TABLET    TAKE 1 TABLET BY MOUTH ONCE DAILY FOR  ANTICOAGULATION   FINASTERIDE (PROSCAR) 5 MG TABLET    TAKE 1 TABLET BY MOUTH ONCE DAILY TO HELP SHRINK PROSTATE.   FUROSEMIDE (LASIX) 20 MG TABLET    Take 1 tablet (20 mg total) by mouth daily as needed for edema.   METOPROLOL  TARTRATE (LOPRESSOR) 25 MG TABLET    Take 1 tablet (25 mg total) by mouth 2 (two) times daily.   MULTIPLE VITAMINS-MINERALS (PRESERVISION AREDS 2 PO)    Take 1 tablet by mouth daily.   PRIMIDONE (MYSOLINE) 50 MG TABLET    TAKE 1 TABLET BY MOUTH ONCE DAILY IN THE EVENING TO HELP WITH TREMORS.  Modified Medications   No medications on file  Discontinued Medications   No medications on file     Physical Exam:  Vitals:   07/16/18 1002  BP: 134/78  Pulse: 94  Temp: 98.5 F (36.9 C)  TempSrc: Oral  SpO2: 95%  Weight: 172 lb (78 kg)  Height: 5\' 8"  (1.727 m)   Body mass index is 26.15 kg/m.  Physical Exam  Constitutional: He is oriented to person, place, and time. He appears well-developed.  Frail appearing in NAD  HENT:  Mouth/Throat: Oropharynx is clear and moist.  MMM; no oral thrush  Eyes: Pupils are equal, round, and reactive to light. No scleral icterus.  Neck: Neck supple. Carotid bruit is not present. No thyromegaly present.  Cardiovascular: Normal rate, regular rhythm and intact distal pulses. Exam reveals no gallop and no friction rub.  Murmur (1/6 SEM) heard. Pulmonary/Chest: Effort normal. He has decreased breath sounds. He has no wheezes. He has no rales. He exhibits no tenderness.  Abdominal: Soft. Bowel sounds are normal. He exhibits no distension, no abdominal bruit, no pulsatile midline mass and no mass. There is no hepatomegaly. There is no tenderness. There is no rebound and no guarding.  Musculoskeletal: He exhibits  edema (trace bilaterally) and deformity (to bilateral hands ).  Lymphadenopathy:    He has no cervical adenopathy.  Neurological: He is alert and oriented to person, place, and time. He has normal reflexes.  Skin: Skin is warm and dry. No rash noted.  Small red area to right lower buttock  Psychiatric: He has a normal mood and affect. His behavior is normal. Judgment and thought content normal.    Labs reviewed: Basic Metabolic Panel: Recent Labs    03/27/18 1435 03/28/18 0628 03/30/18 0444  NA 140 140 139  K 4.4 4.6 5.0  CL 105 107 107  CO2 24 26 24   GLUCOSE 158* 101* 99  BUN 48* 44* 56*  CREATININE 2.46* 2.14* 2.31*  CALCIUM 8.8* 8.9 8.7*  TSH  --  2.130  --    Liver Function Tests: Recent Labs    12/31/17 1219 02/26/18 1215  AST 19 24  ALT 19 30  BILITOT 0.5 0.5  PROT 6.5 6.4   No results for input(s): LIPASE, AMYLASE in the last 8760 hours. No results for input(s): AMMONIA in the last 8760 hours. CBC: Recent Labs    12/31/17 1219 03/27/18 1435 03/28/18 0628  WBC 6.9 7.5 6.7  NEUTROABS 3,933  --   --   HGB 13.9 12.9* 12.8*  HCT 40.2 42.1 40.7  MCV 95.0 105.0* 103.3*  PLT 163 113* 112*   Lipid Panel: No results for input(s): CHOL, HDL, LDLCALC, TRIG, CHOLHDL, LDLDIRECT in the last 8760 hours. TSH: Recent Labs    03/28/18 0628  TSH 2.130   A1C: No results found for: HGBA1C   Assessment/Plan 1. Essential hypertension -controlled on current regimen. Has cut out sodium at this time. Tolerating norvasc with slight LE edema but managed with compression hose  - COMPLETE METABOLIC PANEL WITH GFR - CBC with Differential/Platelets  2. PSA elevation -last PSA in normal range, continues on proscar  -  finasteride (PROSCAR) 5 MG tablet; TAKE 1 TABLET BY MOUTH ONCE DAILY TO HELP SHRINK PROSTATE.  Dispense: 90 tablet; Refill: 1  3. Flatulence Noted, eating increase fiber and having regular BMs  4. Pressure injury of right buttock, stage 1 -continue to  use barrier cream -frequent repositioning -no DONUT device but to use pressure relieving surfaces.  -continue good protein in diet.   5. Need for influenza vaccination - Flu vaccine HIGH DOSE PF (Fluzone High dose)  6. Other headache syndrome -have improved significantly however sed rate was elevated when checked by neurologist and repeat request. They have asked that these labs be drawn in office.  - Sedimentation Rate - C-reactive Protein    Next appt: 3 months, sooner if needed [place]anna Almendariz K. Swisher, [facility] [phone]

## 2018-07-16 NOTE — Patient Instructions (Addendum)
No DONUT cushions but to use pressure relieving surfaces.  Continue with good protein in diet Changing positions frequently

## 2018-07-16 NOTE — Progress Notes (Signed)
Subjective:   Drew Gibson is a 84 y.o. male who presents for Medicare Annual/Subsequent preventive examination.  Last AWV-07/03/2017    Objective:    Vitals: BP 134/78 (BP Location: Left Arm, Patient Position: Sitting)   Pulse 94   Temp 98.5 F (36.9 C) (Oral)   Ht 5\' 8"  (1.727 m)   Wt 172 lb (78 kg)   SpO2 95%   BMI 26.15 kg/m   Body mass index is 26.15 kg/m.  Advanced Directives 07/16/2018 07/16/2018 04/25/2018 03/26/2018 03/05/2018 07/03/2017 06/12/2017  Does Patient Have a Medical Advance Directive? Yes Yes No No Yes Yes No  Type of Paramedic of [place];Living will [place];Living will - - [place];Living will Living will -  Does patient want to make changes to medical advance directive? No - Patient declined - - - - - -  Copy of [place] in Chart? No - copy requested No - copy requested - - No - copy requested - -  Would patient like information on creating a medical advance directive? - - No - Patient declined - - - -  Pre-existing out of facility DNR order (yellow form or pink MOST form) - - - - - - -    Tobacco Social History   Tobacco Use  Smoking Status Former Smoker  . Packs/day: 1.00  . Years: 40.00  . Pack years: 40.00  . Types: Cigarettes  . Last attempt to quit: 05/10/1985  . Years since quitting: 33.2  Smokeless Tobacco Former Systems developer  . Quit date: 02/07/2001  Tobacco Comment   quit 30 years ago     Counseling given: Not Answered Comment: quit 30 years ago   Clinical Intake:  Pre-visit preparation completed: No  Pain : No/denies pain     Diabetes: No  How often do you need to have someone help you when you read instructions, pamphlets, or other written materials from your doctor or pharmacy?: 3 - Sometimes  Interpreter Needed?: No  Information entered by :: [name], RN  Past Medical History:  Diagnosis Date  . Abnormality of gait   . Benign neoplasm  of colon   . Benign prostate hyperplasia   . Chronic kidney disease, stage IV (severe) ([place])   . Closed fracture of ramus of right pubis ([place])   . Complication of anesthesia    postop headache after general anesthesia  . Degenerative arthritis   . Diverticulosis of colon (without mention of hemorrhage)   . Duodenal ulcer, unspecified as acute or chronic, without hemorrhage, perforation, or obstruction   . Encounter for long-term (current) use of other medications   . Enlarged prostate    takes Tamsulosin daily  . Essential and other specified forms of tremor   . Foley catheter in place 09-01-13   4 months now  . Foot drop, bilateral   . Gait disorder    balance issues-uses walker or mobile chair.  . Gout    takes Allopurinol daily  . Gout, unspecified   . Hyperlipidemia    but doesn't require meds  . Hypertension    takes Enalapril daily   . Inguinal hernia without mention of obstruction or gangrene, unilateral or unspecified, (not specified as recurrent)   . Internal hemorrhoids without mention of complication   . Macular degeneration    takes eye cap vits daily  . Other abnormal blood chemistry   . PAD (peripheral artery disease) ([place])   . Peripheral  neuropathy   . Peripheral neuropathy   . Pneumonia    hx of;last time 8'14-mild.  . Ptosis    Left side  . Spinal stenosis, lumbar region, with neurogenic claudication   . Syncope   . TIA (transient ischemic attack)    sees Dr.Willis-neurologist 6'14(strange feeling of head)-no residual  . Unspecified vitamin D deficiency   . Urinary frequency   . Urinary urgency   . UTI (lower urinary tract infection) 09-01-13   tx. 2 weeks ago   Past Surgical History:  Procedure Laterality Date  . CATARACT EXTRACTION Right 08/31/2014   Dr.Lyles  . colonosocpy    . FINGER SURGERY Left    Left ring finger  . HERNIA REPAIR     at least 44yrs ago  . HIP PINNING,CANNULATED Left 05/05/2013   Procedure: CANNULATED HIP PINNING- left;   Surgeon: Renette Butters, MD;  Location: [facility];  Service: Orthopedics;  Laterality: Left;  . INGUINAL HERNIA REPAIR  09/05/2012   Procedure: LAPAROSCOPIC INGUINAL HERNIA;  Surgeon: Gayland Curry, MD,FACS;  Location: [facility];  Service: General;  Laterality: Left;  . INSERTION OF MESH  09/05/2012   Procedure: INSERTION OF MESH;  Surgeon: Gayland Curry, MD,FACS;  Location: [facility];  Service: General;  Laterality: Left;  . left leg surgery  2011  . NERVE BIOPSY    . TRANSURETHRAL RESECTION OF PROSTATE N/A 09/07/2013   Procedure: CYSTO TRANSURETHRAL RESECTION OF THE PROSTATE (TURP);  Surgeon: Reece Packer, MD;  Location: [facility];  Service: Urology;  Laterality: N/A;   Family History  Problem Relation Age of Onset  . Diabetes Mother        type 2  . Clotting disorder Mother        deceased, blood clot  . Leukemia Father   . COPD Brother   . Lung cancer Sister   . Heart disease Brother        1/2 brother  . COPD Brother    Social History   Socioeconomic History  . Marital status: Married    Spouse name: Not on file  . Number of children: 2  . Years of education: 9  . Highest education level: Not on file  Occupational History  . 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 Financial resource strain: Not hard at all  . Food insecurity:    Worry: Never true    Inability: Never true  . Transportation needs:    Medical: No    Non-medical: No  Tobacco Use  . Smoking status: Former Smoker    Packs/day: 1.00    Years: 40.00    Pack years: 40.00    Types: Cigarettes    Last attempt to quit: 05/10/1985    Years since quitting: 33.2  . Smokeless tobacco: Former Systems developer    Quit date: 02/07/2001  . Tobacco comment: quit 30 years ago  Substance and Sexual Activity  . Alcohol use: No    Alcohol/week: 0.0 standard drinks  . Drug use: No  . Sexual activity: Not Currently  Lifestyle  . Physical activity:    Days per week: 7 days    Minutes per session: 20 min  . Stress: Not  at all  Relationships  . Social connections:    Talks on phone: More than three times a week    Gets together: More than three times a week    Attends religious service: More than 4 times per year  Active member of club or organization: No    Attends meetings of clubs or organizations: Never    Relationship status: Married  Other Topics Concern  . Not on file  Social History Narrative   Lives with wife Inez Catalina   Former smoker stopped 1986   Alcohol none   Exercise none    Outpatient Encounter Medications as of 07/16/2018  Medication Sig  . acetaminophen (TYLENOL) 500 MG tablet Take 500-1,000 mg by mouth every 6 (six) hours as needed.  Marland Kitchen allopurinol (ZYLOPRIM) 100 MG tablet Take 2 tablets (200 mg total) by mouth daily.  Marland Kitchen amLODipine (NORVASC) 5 MG tablet Take 1.5 tablets (7.5 mg total) by mouth daily. At lunch  . b complex vitamins capsule Take 1 capsule by mouth daily.   . Cholecalciferol (VITAMIN D3) 2000 UNITS TABS Take 2,000 Units by mouth daily.   . clopidogrel (PLAVIX) 75 MG tablet TAKE 1 TABLET BY MOUTH ONCE DAILY FOR  ANTICOAGULATION  . finasteride (PROSCAR) 5 MG tablet TAKE 1 TABLET BY MOUTH ONCE DAILY TO HELP SHRINK PROSTATE.  . furosemide (LASIX) 20 MG tablet Take 1 tablet (20 mg total) by mouth daily as needed for edema.  . metoprolol tartrate (LOPRESSOR) 25 MG tablet Take 1 tablet (25 mg total) by mouth 2 (two) times daily.  . Multiple Vitamins-Minerals (PRESERVISION AREDS 2 PO) Take 1 tablet by mouth daily.  . primidone (MYSOLINE) 50 MG tablet TAKE 1 TABLET BY MOUTH ONCE DAILY IN THE EVENING TO HELP WITH TREMORS.   No facility-administered encounter medications on file as of 07/16/2018.     Activities of Daily Living In your present state of health, do you have any difficulty performing the following activities: 07/16/2018  Hearing? N  Vision? N  Difficulty concentrating or making decisions? N  Walking or climbing stairs? Y  Dressing or bathing? Y  Doing  errands, shopping? Y  Preparing Food and eating ? N  Using the Toilet? N  In the past six months, have you accidently leaked urine? Y  Do you have problems with loss of bowel control? Y  Managing your Medications? Y  Managing your Finances? Y  Housekeeping or managing your Housekeeping? Y  Some recent data might be hidden    Patient Care Team: [name], NP as PCP - General (Geriatric Medicine) [name], MD as Consulting Physician (Nephrology)   Assessment:   This is a routine wellness examination for [facility].  Exercise Activities and Dietary recommendations Current Exercise Habits: Home exercise routine, Type of exercise: strength training/weights;walking, Time (Minutes): 20, Frequency (Times/Week): 7, Weekly Exercise (Minutes/Week): 140, Intensity: Mild, Exercise limited by: orthopedic condition(s)  Goals   None     Fall Risk Fall Risk  07/16/2018 07/16/2018 05/15/2018 04/25/2018 03/26/2018  Falls in the past year? Yes Yes No Yes Yes  Number falls in past yr: 2 or more 2 or more - 1 2 or more  Comment - - - - -  Injury with Fall? Yes Yes - Yes Yes  Comment - - - - -  Risk for fall due to : - - - - -   Is the patient's home free of loose throw rugs in walkways, pet beds, electrical cords, etc?   yes      Grab bars in the bathroom? yes      Handrails on the stairs?   yes      Adequate lighting?   yes  Timed Get Up and Go Performed: patient is unambulatory without his  walker  Depression Screen PHQ 2/9 Scores 07/16/2018 05/15/2018 04/25/2018 07/03/2017  PHQ - 2 Score 0 0 0 0    Cognitive Function MMSE - Mini Mental State Exam 07/16/2018 07/03/2017  Not completed: Unable to complete -  Orientation to time 5 4  Orientation to Place 5 5  Registration 3 3  Attention/ Calculation 3 4  Recall 1 2  Language- name 2 objects 2 2  Language- repeat 1 1  Language- follow 3 step command 3 3  Language- read & follow direction 1 1  Write a sentence 0 1  Copy  design 0 1  Total score 24 27        Immunization History  Administered Date(s) Administered  . Influenza, High Dose Seasonal PF 07/03/2017, 07/16/2018  . Influenza,inj,Quad PF,6+ Mos 07/07/2013, 07/23/2014, 06/29/2015, 06/06/2016  . Influenza-Unspecified 07/11/2011  . Pneumococcal Conjugate-13 12/05/2016  . Pneumococcal Polysaccharide-23 11/02/1995  . Td 07/08/2006  . Tdap 08/25/2016    Qualifies for Shingles Vaccine? Yes, educated and will check the cost  Screening Tests Health Maintenance  Topic Date Due  . PNA vac Low Risk Adult (2 of 2 - PPSV23) 12/05/2017  . INFLUENZA VACCINE  05/01/2018  . TETANUS/TDAP  08/25/2026   Cancer Screenings: Lung: Low Dose CT Chest recommended if Age 83-80 years, 30 pack-year currently smoking OR have quit w/in 15years. Patient does not qualify. Colorectal: up to dtae  Additional Screenings:  Hepatitis C Screening: declined    Plan:    I have personally reviewed and addressed the Medicare Annual Wellness questionnaire and have noted the following in the patient's chart:  A. Medical and social history B. Use of alcohol, tobacco or illicit drugs  C. Current medications and supplements D. Functional ability and status E.  Nutritional status F.  Physical activity G. Advance directives H. List of other physicians I.  Hospitalizations, surgeries, and ER visits in previous 12 months J.  [place] to include hearing, vision, cognitive, depression L. Referrals and appointments - none  In addition, I have reviewed and discussed with patient certain preventive protocols, quality metrics, and best practice recommendations. A written personalized care plan for preventive services as well as general preventive health recommendations were provided to patient.  See attached scanned questionnaire for additional information.   Signed,   [name], RN Nurse Health Advisor  Patient Concerns: None

## 2018-07-17 ENCOUNTER — Telehealth: Payer: Self-pay | Admitting: Neurology

## 2018-07-17 LAB — COMPLETE METABOLIC PANEL WITH GFR
AG Ratio: 1.2 (calc) (ref 1.0–2.5)
ALBUMIN MSPROF: 3.6 g/dL (ref 3.6–5.1)
ALT: 37 U/L (ref 9–46)
AST: 31 U/L (ref 10–35)
Alkaline phosphatase (APISO): 106 U/L (ref 40–115)
BILIRUBIN TOTAL: 0.3 mg/dL (ref 0.2–1.2)
BUN / CREAT RATIO: 24 (calc) — AB (ref 6–22)
BUN: 52 mg/dL — ABNORMAL HIGH (ref 7–25)
CO2: 24 mmol/L (ref 20–32)
CREATININE: 2.16 mg/dL — AB (ref 0.70–1.11)
Calcium: 9.1 mg/dL (ref 8.6–10.3)
Chloride: 103 mmol/L (ref 98–110)
GFR, EST AFRICAN AMERICAN: 31 mL/min/{1.73_m2} — AB (ref >=60)
GFR, Est Non African American: 27 mL/min/{1.73_m2} — ABNORMAL LOW (ref >=60)
GLOBULIN: 2.9 g/dL (ref 1.9–3.7)
Glucose, Bld: 91 mg/dL (ref 65–139)
Potassium: 5.1 mmol/L (ref 3.5–5.3)
SODIUM: 135 mmol/L (ref 135–146)
Total Protein: 6.5 g/dL (ref 6.1–8.1)

## 2018-07-17 LAB — CBC WITH DIFFERENTIAL/PLATELET
BASOS PCT: 0.8 %
Basophils Absolute: 57 cells/uL (ref 0–200)
EOS ABS: 270 {cells}/uL (ref 15–500)
Eosinophils Relative: 3.8 %
HEMATOCRIT: 38 % — AB (ref 38.5–50.0)
HEMOGLOBIN: 12.8 g/dL — AB (ref 13.2–17.1)
LYMPHS ABS: 1953 {cells}/uL (ref 850–3900)
MCH: 31.9 pg (ref 27.0–33.0)
MCHC: 33.7 g/dL (ref 32.0–36.0)
MCV: 94.8 fL (ref 80.0–100.0)
MPV: 10.8 fL (ref 7.5–12.5)
Monocytes Relative: 7.3 %
Neutro Abs: 4303 cells/uL (ref 1500–7800)
Neutrophils Relative %: 60.6 %
PLATELETS: 166 10*3/uL (ref 140–400)
RBC: 4.01 10*6/uL — AB (ref 4.20–5.80)
RDW: 14.2 % (ref 11.0–15.0)
Total Lymphocyte: 27.5 %
WBC mixed population: 518 cells/uL (ref 200–950)
WBC: 7.1 10*3/uL (ref 3.8–10.8)

## 2018-07-17 LAB — SEDIMENTATION RATE: Sed Rate: 36 mm/h — ABNORMAL HIGH (ref 0–20)

## 2018-07-17 LAB — C-REACTIVE PROTEIN: CRP: 8.3 mg/L — ABNORMAL HIGH (ref <8.0)

## 2018-07-17 NOTE — Telephone Encounter (Signed)
-----   Message from [name], NP sent at 07/17/2018  8:42 AM EDT ----- Blood count, kidney function, liver function and electrolytes are stable. CPR and sed rate drawn in office- CRP and sed rate slightly elevated, will fwd to Dr Jannifer Franklin at this time since he was requesting these labs for follow up.

## 2018-07-17 NOTE — Telephone Encounter (Signed)
I called the patient, talk with the wife.  The recent blood work done shows that the C-reactive protein is minimally above the normal range, the sedimentation rate is clearly dropping from what it has been, still elevated.  We will watch this issue for now.

## 2018-07-29 DIAGNOSIS — H353211 Exudative age-related macular degeneration, right eye, with active choroidal neovascularization: Secondary | ICD-10-CM | POA: Diagnosis not present

## 2018-07-29 DIAGNOSIS — H353122 Nonexudative age-related macular degeneration, left eye, intermediate dry stage: Secondary | ICD-10-CM | POA: Diagnosis not present

## 2018-07-29 DIAGNOSIS — H43813 Vitreous degeneration, bilateral: Secondary | ICD-10-CM | POA: Diagnosis not present

## 2018-08-01 DIAGNOSIS — M79671 Pain in right foot: Secondary | ICD-10-CM | POA: Diagnosis not present

## 2018-08-01 DIAGNOSIS — L84 Corns and callosities: Secondary | ICD-10-CM | POA: Diagnosis not present

## 2018-08-01 DIAGNOSIS — M79672 Pain in left foot: Secondary | ICD-10-CM | POA: Diagnosis not present

## 2018-08-01 DIAGNOSIS — B351 Tinea unguium: Secondary | ICD-10-CM | POA: Diagnosis not present

## 2018-08-05 DIAGNOSIS — D485 Neoplasm of uncertain behavior of skin: Secondary | ICD-10-CM | POA: Diagnosis not present

## 2018-08-05 DIAGNOSIS — Z85828 Personal history of other malignant neoplasm of skin: Secondary | ICD-10-CM | POA: Diagnosis not present

## 2018-08-05 DIAGNOSIS — L570 Actinic keratosis: Secondary | ICD-10-CM | POA: Diagnosis not present

## 2018-08-05 DIAGNOSIS — C44319 Basal cell carcinoma of skin of other parts of face: Secondary | ICD-10-CM | POA: Diagnosis not present

## 2018-08-05 DIAGNOSIS — L905 Scar conditions and fibrosis of skin: Secondary | ICD-10-CM | POA: Diagnosis not present

## 2018-08-22 ENCOUNTER — Ambulatory Visit (INDEPENDENT_AMBULATORY_CARE_PROVIDER_SITE_OTHER): Payer: Medicare Other | Admitting: Nurse Practitioner

## 2018-08-22 ENCOUNTER — Encounter: Payer: Self-pay | Admitting: Nurse Practitioner

## 2018-08-22 VITALS — BP 136/82 | HR 68 | Temp 97.9°F | Ht 68.0 in | Wt 162.0 lb

## 2018-08-22 DIAGNOSIS — R0602 Shortness of breath: Secondary | ICD-10-CM

## 2018-08-22 DIAGNOSIS — L89311 Pressure ulcer of right buttock, stage 1: Secondary | ICD-10-CM | POA: Diagnosis not present

## 2018-08-22 DIAGNOSIS — N184 Chronic kidney disease, stage 4 (severe): Secondary | ICD-10-CM | POA: Diagnosis not present

## 2018-08-22 NOTE — Progress Notes (Signed)
Careteam: Patient Care Team: [name], NP as PCP - General (Geriatric Medicine) [name], MD as Consulting Physician (Nephrology)  Advanced Directive information Does Patient Have a Medical Advance Directive?: No  Allergies  Allergen Reactions  . Aleve [Naproxen Sodium] Other (See Comments)    Upset stomach   . Antihistamines, Diphenhydramine-Type Other (See Comments)    Causes urinary issues  . Dimenhydrinate Other (See Comments)    Causes urinary issues Causes urinary issues Causes urinary issues Causes urinary issues  . Nsaids     upset stomach  . Tolmetin Other (See Comments)    Bother his stomach Bother his stomach    Chief Complaint  Patient presents with  . Acute Visit    Pt has had a cough and SOB for about 3 weeks.      HPI: Patient is a 84 y.o. male seen in the office today for shortness of breath.  Reports increase in shortness of breath with minimal activity at night.  No shortness of breath currently but with activity this worsens.  When he stops the shortness of breath resolves.  Wife reports he has a new cough and noticed wheezing with it.  In the last 6 month has needed to prop himself up with 2 pillows to breath at night and to prevent coughing. No increase in swelling.  Weight is varying from 162-166 at home  No fevers.  No increase in fatigue or malaise. Feels fine overall Wife reports frequent chest xrays in the last few months.  Last chest xray was 06/05/18 was done due to cough and shortness of breath-  IMPRESSION: Minimal pleural effusion bilaterally. Negative for heart failure or pneumonia.  Seeing nephrologist next Wednesday.  Noted to have some RAS- talked about intervention but they decided not to do this.   Has a spot on his bottom- evaluated last OV and Dr Zigmund Daniel evaluated during home visit- using zinc oxide daily. Would like it to be checked today Using pressure reduction   Review of Systems:  Review of  Systems  Constitutional: Negative for chills and fever.  HENT: Negative for congestion.   Eyes:       Glasses  Respiratory: Negative for cough, hemoptysis, sputum production, shortness of breath and wheezing.        With exertion "doing anything significant"  Cardiovascular: Negative for chest pain, palpitations and leg swelling.       Edema better with compression hose  Gastrointestinal: Negative for abdominal pain and constipation.  Genitourinary: Negative for dysuria.  Musculoskeletal: Negative for falls.  Neurological: Negative for dizziness and headaches.    Past Medical History:  Diagnosis Date  . Abnormality of gait   . Benign neoplasm of colon   . Benign prostate hyperplasia   . Chronic kidney disease, stage IV (severe) ([place])   . Closed fracture of ramus of right pubis ([place])   . Complication of anesthesia    postop headache after general anesthesia  . Degenerative arthritis   . Diverticulosis of colon (without mention of hemorrhage)   . Duodenal ulcer, unspecified as acute or chronic, without hemorrhage, perforation, or obstruction   . Encounter for long-term (current) use of other medications   . Enlarged prostate    takes Tamsulosin daily  . Essential and other specified forms of tremor   . Foley catheter in place 09-01-13   4 months now  . Foot drop, bilateral   . Gait disorder    balance issues-uses walker or mobile chair.  Marland Kitchen  Gout    takes Allopurinol daily  . Gout, unspecified   . Hyperlipidemia    but doesn't require meds  . Hypertension    takes Enalapril daily   . Inguinal hernia without mention of obstruction or gangrene, unilateral or unspecified, (not specified as recurrent)   . Internal hemorrhoids without mention of complication   . Macular degeneration    takes eye cap vits daily  . Other abnormal blood chemistry   . PAD (peripheral artery disease) ([place])   . Peripheral neuropathy   . Peripheral neuropathy   . Pneumonia    hx of;last time  8'14-mild.  . Ptosis    Left side  . Spinal stenosis, lumbar region, with neurogenic claudication   . Syncope   . TIA (transient ischemic attack)    sees Dr.Willis-neurologist 6'14(strange feeling of head)-no residual  . Unspecified vitamin D deficiency   . Urinary frequency   . Urinary urgency   . UTI (lower urinary tract infection) 09-01-13   tx. 2 weeks ago   Past Surgical History:  Procedure Laterality Date  . CATARACT EXTRACTION Right 08/31/2014   Dr.Lyles  . colonosocpy    . FINGER SURGERY Left    Left ring finger  . HERNIA REPAIR     at least 68yrs ago  . HIP PINNING,CANNULATED Left 05/05/2013   Procedure: CANNULATED HIP PINNING- left;  Surgeon: Renette Butters, MD;  Location: [facility];  Service: Orthopedics;  Laterality: Left;  . INGUINAL HERNIA REPAIR  09/05/2012   Procedure: LAPAROSCOPIC INGUINAL HERNIA;  Surgeon: Gayland Curry, MD,FACS;  Location: [facility];  Service: General;  Laterality: Left;  . INSERTION OF MESH  09/05/2012   Procedure: INSERTION OF MESH;  Surgeon: Gayland Curry, MD,FACS;  Location: [facility];  Service: General;  Laterality: Left;  . left leg surgery  2011  . NERVE BIOPSY    . TRANSURETHRAL RESECTION OF PROSTATE N/A 09/07/2013   Procedure: CYSTO TRANSURETHRAL RESECTION OF THE PROSTATE (TURP);  Surgeon: Reece Packer, MD;  Location: [facility];  Service: Urology;  Laterality: N/A;   Social History:   reports that he quit smoking about 33 years ago. His smoking use included cigarettes. He has a 40.00 pack-year smoking history. He quit smokeless tobacco use about 17 years ago. He reports that he does not drink alcohol or use drugs.  Family History  Problem Relation Age of Onset  . Diabetes Mother        type 2  . Clotting disorder Mother        deceased, blood clot  . Leukemia Father   . COPD Brother   . Lung cancer Sister   . Heart disease Brother        1/2 brother  . COPD Brother     Medications: Patient's Medications  New Prescriptions   No  medications on file  Previous Medications   ACETAMINOPHEN (TYLENOL) 500 MG TABLET    Take 500-1,000 mg by mouth every 6 (six) hours as needed.   ALLOPURINOL (ZYLOPRIM) 100 MG TABLET    Take 2 tablets (200 mg total) by mouth daily.   AMLODIPINE (NORVASC) 5 MG TABLET    Take 1.5 tablets (7.5 mg total) by mouth daily. At lunch   B COMPLEX VITAMINS CAPSULE    Take 1 capsule by mouth daily.    CHOLECALCIFEROL (VITAMIN D3) 2000 UNITS TABS    Take 2,000 Units by mouth daily.    CLOPIDOGREL (PLAVIX) 75 MG TABLET    TAKE  1 TABLET BY MOUTH ONCE DAILY FOR  ANTICOAGULATION   FINASTERIDE (PROSCAR) 5 MG TABLET    TAKE 1 TABLET BY MOUTH ONCE DAILY TO HELP SHRINK PROSTATE.   FUROSEMIDE (LASIX) 20 MG TABLET    Take 1 tablet (20 mg total) by mouth daily as needed for edema.   METOPROLOL TARTRATE (LOPRESSOR) 25 MG TABLET    Take 1 tablet (25 mg total) by mouth 2 (two) times daily.   MULTIPLE VITAMINS-MINERALS (PRESERVISION AREDS 2 PO)    Take 1 tablet by mouth daily.   PRIMIDONE (MYSOLINE) 50 MG TABLET    TAKE 1 TABLET BY MOUTH ONCE DAILY IN THE EVENING TO HELP WITH TREMORS.  Modified Medications   No medications on file  Discontinued Medications   No medications on file     Physical Exam:  Vitals:   08/22/18 1119  BP: 136/82  Pulse: 68  Temp: 97.9 F (36.6 C)  TempSrc: Oral  SpO2: 95%  Weight: 162 lb (73.5 kg)  Height: 5\' 8"  (1.727 m)   Body mass index is 24.63 kg/m.  Physical Exam  Constitutional: He is oriented to person, place, and time. He appears well-developed.  Frail appearing in NAD  HENT:  Mouth/Throat: Oropharynx is clear and moist.  MMM; no oral thrush  Eyes: Pupils are equal, round, and reactive to light. No scleral icterus.  Neck: Neck supple. Carotid bruit is not present. No thyromegaly present.  Cardiovascular: Normal rate, regular rhythm and intact distal pulses. Exam reveals no gallop and no friction rub.  Murmur (1/6 SEM) heard. Pulmonary/Chest: Effort normal. He has  decreased breath sounds. He has no wheezes. He has no rales. He exhibits no tenderness.  Abdominal: He exhibits no abdominal bruit and no pulsatile midline mass. There is no hepatomegaly. There is no rebound.  Musculoskeletal: He exhibits edema (1+bilaterally) and deformity (to bilateral hands ).  Lymphadenopathy:    He has no cervical adenopathy.  Neurological: He is alert and oriented to person, place, and time. He has normal reflexes.  Skin: Skin is warm and dry. No rash noted.  Small red area to right lower buttock bilaterally  Psychiatric: He has a normal mood and affect. His behavior is normal. Judgment and thought content normal.    Labs reviewed: Basic Metabolic Panel: Recent Labs    03/28/18 0628 03/30/18 0444 07/16/18 1115  NA 140 139 135  K 4.6 5.0 5.1  CL 107 107 103  CO2 26 24 24   GLUCOSE 101* 99 91  BUN 44* 56* 52*  CREATININE 2.14* 2.31* 2.16*  CALCIUM 8.9 8.7* 9.1  TSH 2.130  --   --    Liver Function Tests: Recent Labs    12/31/17 1219 02/26/18 1215 07/16/18 1115  AST 19 24 31   ALT 19 30 37  BILITOT 0.5 0.5 0.3  PROT 6.5 6.4 6.5   No results for input(s): LIPASE, AMYLASE in the last 8760 hours. No results for input(s): AMMONIA in the last 8760 hours. CBC: Recent Labs    12/31/17 1219 03/27/18 1435 03/28/18 0628 07/16/18 1115  WBC 6.9 7.5 6.7 7.1  NEUTROABS 3,933  --   --  4,303  HGB 13.9 12.9* 12.8* 12.8*  HCT 40.2 42.1 40.7 38.0*  MCV 95.0 105.0* 103.3* 94.8  PLT 163 113* 112* 166   Lipid Panel: No results for input(s): CHOL, HDL, LDLCALC, TRIG, CHOLHDL, LDLDIRECT in the last 8760 hours. TSH: Recent Labs    03/28/18 0628  TSH 2.130   A1C: No results  found for: HGBA1C  Vas US Renal Artery Duplex  Result Date: 06/11/2018 ABDOMINAL VISCERAL Indications: Uncontrolled hypertension and chronic kidney disease state 3 High Risk Factors: Hypertension, hyperlipidemia, prior CVA. Limitations: Patient discomfort and air/bowel gas. Performing  Technologist: Wilkie Aye RVT  Examination Guidelines: A complete evaluation includes B-mode imaging, spectral Doppler, color Doppler, and power Doppler as needed of all accessible portions of each vessel. Bilateral testing is considered an integral part of a complete examination. Limited examinations for reoccurring indications may be performed as noted.  Duplex Findings: +--------------------+--------+--------+------+ Mesenteric          PSV cm/sEDV cm/sPlaque +--------------------+--------+--------+------+ Aorta Prox             57      3           +--------------------+--------+--------+------+ Aorta Distal           22      2           +--------------------+--------+--------+------+ Celiac Artery Origin   96      17          +--------------------+--------+--------+------+ SMA Origin            198                  +--------------------+--------+--------+------+  +------------------+--------+--------+-------+ Right Renal ArteryPSV cm/sEDV cm/sComment +------------------+--------+--------+-------+ Origin              218      3            +------------------+--------+--------+-------+ Proximal            120      13           +------------------+--------+--------+-------+ Mid                  87      14           +------------------+--------+--------+-------+ Distal              168      21           +------------------+--------+--------+-------+ +-----------------+--------+--------+-------------------+ Left Renal ArteryPSV cm/sEDV cm/s      Comment       +-----------------+--------+--------+-------------------+ Origin             232      0                        +-----------------+--------+--------+-------------------+ Proximal           263      4                        +-----------------+--------+--------+-------------------+ Mid                 0            Unable to indentify +-----------------+--------+--------+-------------------+  Distal              52      9                        +-----------------+--------+--------+-------------------+ Technologist observations Renal Artery(s):High resisitive flow in the bilateral renal arteries. By color and spectral Doppler, there is low amplitude flow in the kidneys. +------------+--------+--------+--+-----------+--------+--------+----+ Right KidneyPSV cm/sEDV cm/sRILeft KidneyPSV cm/sEDV cm/sRI   +------------+--------+--------+--+-----------+--------+--------+----+ Upper Pole  0  Upper Pole                      +------------+--------+--------+--+-----------+--------+--------+----+ Mid         0                 Mid        22      1       0.97 +------------+--------+--------+--+-----------+--------+--------+----+ Lower Pole  0                 Lower Pole 12      2       0.85 +------------+--------+--------+--+-----------+--------+--------+----+ Hilar       0                 Hilar      60      14      0.77 +------------+--------+--------+--+-----------+--------+--------+----+ +------------------+-------+------------------+-------+ Right Kidney             Left Kidney               +------------------+-------+------------------+-------+ RAR                      RAR                       +------------------+-------+------------------+-------+ RAR (manual)      3.82   RAR (manual)      4.61    +------------------+-------+------------------+-------+ Cortex                   Cortex                    +------------------+-------+------------------+-------+ Cortex thickness  1.58 mmCorex thickness   0.75 mm +------------------+-------+------------------+-------+ Kidney length (cm)9.93   Kidney length (cm)9.27    +------------------+-------+------------------+-------+ Technically challenging study.  FINAL INTERPRETATION: Renal:  Right: Cyst(s) noted. Evidence of a greater than 60% stenosis of the        right renal  artery.        Normal size right kidney. Abnormal cortical thickness of        right kidney. Abnormal right Resistive Index. RRV flow        present.        Polycystic kidney with the largest measuring 4.8 cm x 7.2 cm. Left:  Abnormal left Resisitve Index. Normal size of left kidney.        Evidence of a > 60% stenosis in the left renal artery. Normal        cortical thickness of the left kidney. Cyst(s) noted. LRV        flow present.        Polycystic kidney with the largest measuring 2.1 cm x 2.6 cm. Mesenteric: Normal Celiac artery and Superior Mesenteric artery findings.  *See table(s) above for measurements and observations.  Vascular consult recommended. Diagnosing physician: Ida Rogue MD  Electronically signed by Ida Rogue MD on 06/11/2018 at 9:38:09 PM.    Final     Assessment/Plan 1. Pressure injury of right buttock, stage 1 Stable, continue pressure reduction and barrier cream   2. SOB (shortness of breath) This appears to be baseline when talking to pt and wife. He does endose worsening shortness of breath with minimal activity but this has been progressive over the last several months without acute changes. O2 sats are unchanged and lung sounds unremarkable. He had a chest xray sept 4 2019 which showed minimal pleural  effusion bilateral and negative for acute process. Reassurance provided to wife and chest xray ordered which they can obtain if symptoms worsen.  - DG Chest 2 View; Future  3. CKD stage 4 Ongoing follow up with nephrology at this time. Noted to have RAS but does not wish to undergo intervention   Next appt:  Luci Bellucci K. [place], [facility] [phone]

## 2018-08-22 NOTE — Patient Instructions (Signed)
Continue cream and pressure reduction for bottom  To get chest xray if symptoms worsen

## 2018-08-27 DIAGNOSIS — I5030 Unspecified diastolic (congestive) heart failure: Secondary | ICD-10-CM | POA: Diagnosis not present

## 2018-08-27 DIAGNOSIS — I129 Hypertensive chronic kidney disease with stage 1 through stage 4 chronic kidney disease, or unspecified chronic kidney disease: Secondary | ICD-10-CM | POA: Diagnosis not present

## 2018-08-27 DIAGNOSIS — N184 Chronic kidney disease, stage 4 (severe): Secondary | ICD-10-CM | POA: Diagnosis not present

## 2018-08-27 DIAGNOSIS — M109 Gout, unspecified: Secondary | ICD-10-CM | POA: Diagnosis not present

## 2018-08-27 DIAGNOSIS — R809 Proteinuria, unspecified: Secondary | ICD-10-CM | POA: Diagnosis not present

## 2018-09-03 ENCOUNTER — Encounter (HOSPITAL_COMMUNITY): Payer: Medicare Other

## 2018-09-09 ENCOUNTER — Ambulatory Visit: Payer: Medicare Other | Admitting: Cardiovascular Disease

## 2018-09-09 DIAGNOSIS — C44319 Basal cell carcinoma of skin of other parts of face: Secondary | ICD-10-CM | POA: Diagnosis not present

## 2018-09-19 ENCOUNTER — Other Ambulatory Visit: Payer: Self-pay | Admitting: Nurse Practitioner

## 2018-10-10 DIAGNOSIS — C4442 Squamous cell carcinoma of skin of scalp and neck: Secondary | ICD-10-CM | POA: Diagnosis not present

## 2018-10-10 DIAGNOSIS — D485 Neoplasm of uncertain behavior of skin: Secondary | ICD-10-CM | POA: Diagnosis not present

## 2018-10-14 ENCOUNTER — Telehealth: Payer: Self-pay | Admitting: *Deleted

## 2018-10-14 NOTE — Telephone Encounter (Signed)
Wife calling stating that pt is having swelling in his feet. She has been giving him lasix every other day and wanted to know if that was ok? Wife also stating that pt has a cough for a while now and wanted to know if they needed to be seen sooner? Wife states pt is elevating his feet and " they look ok" pt has appt on 10/22/2018. Please advise

## 2018-10-14 NOTE — Telephone Encounter (Signed)
Spoke with patient and advised results   

## 2018-10-14 NOTE — Telephone Encounter (Signed)
It should be okay to wait until 10/22/2018 but if she feels like he needs an appt earlier to make one.

## 2018-10-21 ENCOUNTER — Ambulatory Visit: Payer: Medicare Other | Admitting: Nurse Practitioner

## 2018-10-22 ENCOUNTER — Encounter: Payer: Self-pay | Admitting: Nurse Practitioner

## 2018-10-22 ENCOUNTER — Ambulatory Visit: Payer: Self-pay | Admitting: Nurse Practitioner

## 2018-10-22 ENCOUNTER — Ambulatory Visit (INDEPENDENT_AMBULATORY_CARE_PROVIDER_SITE_OTHER): Payer: Medicare Other | Admitting: Nurse Practitioner

## 2018-10-22 VITALS — BP 120/70 | HR 80 | Temp 97.4°F

## 2018-10-22 DIAGNOSIS — Z23 Encounter for immunization: Secondary | ICD-10-CM

## 2018-10-22 DIAGNOSIS — M109 Gout, unspecified: Secondary | ICD-10-CM

## 2018-10-22 DIAGNOSIS — I10 Essential (primary) hypertension: Secondary | ICD-10-CM

## 2018-10-22 DIAGNOSIS — Z8673 Personal history of transient ischemic attack (TIA), and cerebral infarction without residual deficits: Secondary | ICD-10-CM

## 2018-10-22 DIAGNOSIS — N184 Chronic kidney disease, stage 4 (severe): Secondary | ICD-10-CM

## 2018-10-22 DIAGNOSIS — R600 Localized edema: Secondary | ICD-10-CM | POA: Diagnosis not present

## 2018-10-22 DIAGNOSIS — R972 Elevated prostate specific antigen [PSA]: Secondary | ICD-10-CM

## 2018-10-22 DIAGNOSIS — R0602 Shortness of breath: Secondary | ICD-10-CM

## 2018-10-22 MED ORDER — FINASTERIDE 5 MG PO TABS: ORAL_TABLET | ORAL | 1 refills | Status: DC

## 2018-10-22 MED ORDER — AMLODIPINE BESYLATE 5 MG PO TABS: 7.5000 mg | ORAL_TABLET | Freq: Every day | ORAL | 3 refills | Status: DC

## 2018-10-22 MED ORDER — ZOSTER VAC RECOMB ADJUVANTED 50 MCG/0.5ML IM SUSR: 0.5000 mL | Freq: Once | INTRAMUSCULAR | 0 refills | Status: DC

## 2018-10-22 MED ORDER — ZOSTER VAC RECOMB ADJUVANTED 50 MCG/0.5ML IM SUSR: 0.5000 mL | Freq: Once | INTRAMUSCULAR | 1 refills | Status: AC

## 2018-10-22 MED ORDER — FUROSEMIDE 20 MG PO TABS: 20.0000 mg | ORAL_TABLET | Freq: Every day | ORAL | 0 refills | Status: DC | PRN

## 2018-10-22 NOTE — Progress Notes (Signed)
Careteam: Patient Care Team: [name], NP as PCP - General (Geriatric Medicine) [name], MD as Consulting Physician (Nephrology)  Advanced Directive information    Allergies  Allergen Reactions  . Aleve [Naproxen Sodium] Other (See Comments)    Upset stomach   . Antihistamines, Diphenhydramine-Type Other (See Comments)    Causes urinary issues  . Dimenhydrinate Other (See Comments)    Causes urinary issues Causes urinary issues Causes urinary issues Causes urinary issues  . Nsaids     upset stomach  . Tolmetin Other (See Comments)    Bother his stomach Bother his stomach    Chief Complaint  Patient presents with  . Medical Management of Chronic Issues    3 month follow-up, moderate fall risk. Patient with increased B/P and swelling in legs. Patient also c/o cough and SOB   . Immunizations    Discuss need for Shingrix and Pneu      HPI: Patient is a 85 y.o. male seen in the office today routine follow up.  Pt with worsening LE edema. Wife has upped the lasix to every other day for the last 5 days. Did not help untl last night noticed a little improvement. Does not use routinely   htn- noted to have renal artery stenosis but not going fwd with intervention due to age and comorbities.  Taking norvasc 7.5 mg daily, metoprolol 25 mg BID. In the last 5 days blood pressures have been worse.  Started lasix every other day due to worsening LE edema.  Wife has noted more shortness of breath with shorter distances.   Gout- on allopurinol 100 mg daily, last uric acid level at 8.5  BPH- stable on proscar  Hx of TIA- continues on plavix  Tremor- following with Dr Jannifer Franklin, continues on primidone 50 mg daily which has controlled symptoms.   Review of Systems:  Review of Systems  Constitutional: Negative for chills and fever.  HENT: Negative for congestion.   Eyes:       Glasses  Respiratory: Negative for cough, hemoptysis, sputum production,  shortness of breath and wheezing.        With exertion "doing anything significant"  Cardiovascular: Negative for chest pain, palpitations and leg swelling.       Edema improved with lasix but still ongoing, R>L  Gastrointestinal: Negative for abdominal pain and constipation.  Genitourinary: Negative for dysuria.  Musculoskeletal: Negative for falls.  Neurological: Positive for tremors (controlled). Negative for dizziness and headaches.    Past Medical History:  Diagnosis Date  . Abnormality of gait   . Benign neoplasm of colon   . Benign prostate hyperplasia   . Chronic kidney disease, stage IV (severe) ([place])   . Closed fracture of ramus of right pubis ([place])   . Complication of anesthesia    postop headache after general anesthesia  . Degenerative arthritis   . Diverticulosis of colon (without mention of hemorrhage)   . Duodenal ulcer, unspecified as acute or chronic, without hemorrhage, perforation, or obstruction   . Encounter for long-term (current) use of other medications   . Enlarged prostate    takes Tamsulosin daily  . Essential and other specified forms of tremor   . Foley catheter in place 09-01-13   4 months now  . Foot drop, bilateral   . Gait disorder    balance issues-uses walker or mobile chair.  . Gout    takes Allopurinol daily  . Gout, unspecified   . Hyperlipidemia  but doesn't require meds  . Hypertension    takes Enalapril daily   . Inguinal hernia without mention of obstruction or gangrene, unilateral or unspecified, (not specified as recurrent)   . Internal hemorrhoids without mention of complication   . Macular degeneration    takes eye cap vits daily  . Other abnormal blood chemistry   . PAD (peripheral artery disease) ([place])   . Peripheral neuropathy   . Peripheral neuropathy   . Pneumonia    hx of;last time 8'14-mild.  . Ptosis    Left side  . Spinal stenosis, lumbar region, with neurogenic claudication   . Syncope   . TIA (transient  ischemic attack)    sees Dr.Willis-neurologist 6'14(strange feeling of head)-no residual  . Unspecified vitamin D deficiency   . Urinary frequency   . Urinary urgency   . UTI (lower urinary tract infection) 09-01-13   tx. 2 weeks ago   Past Surgical History:  Procedure Laterality Date  . CATARACT EXTRACTION Right 08/31/2014   Dr.Lyles  . colonosocpy    . FINGER SURGERY Left    Left ring finger  . HERNIA REPAIR     at least 38yrs ago  . HIP PINNING,CANNULATED Left 05/05/2013   Procedure: CANNULATED HIP PINNING- left;  Surgeon: Renette Butters, MD;  Location: [facility];  Service: Orthopedics;  Laterality: Left;  . INGUINAL HERNIA REPAIR  09/05/2012   Procedure: LAPAROSCOPIC INGUINAL HERNIA;  Surgeon: Gayland Curry, MD,FACS;  Location: [facility];  Service: General;  Laterality: Left;  . INSERTION OF MESH  09/05/2012   Procedure: INSERTION OF MESH;  Surgeon: Gayland Curry, MD,FACS;  Location: [facility];  Service: General;  Laterality: Left;  . left leg surgery  2011  . NERVE BIOPSY    . TRANSURETHRAL RESECTION OF PROSTATE N/A 09/07/2013   Procedure: CYSTO TRANSURETHRAL RESECTION OF THE PROSTATE (TURP);  Surgeon: Reece Packer, MD;  Location: [facility];  Service: Urology;  Laterality: N/A;   Social History:   reports that he quit smoking about 33 years ago. His smoking use included cigarettes. He has a 40.00 pack-year smoking history. He quit smokeless tobacco use about 17 years ago. He reports that he does not drink alcohol or use drugs.  Family History  Problem Relation Age of Onset  . Diabetes Mother        type 2  . Clotting disorder Mother        deceased, blood clot  . Leukemia Father   . COPD Brother   . Lung cancer Sister   . Heart disease Brother        1/2 brother  . COPD Brother     Medications: Patient's Medications  New Prescriptions   No medications on file  Previous Medications   ACETAMINOPHEN (TYLENOL) 500 MG TABLET    Take 500-1,000 mg by mouth every 6 (six) hours as  needed.   ALLOPURINOL (ZYLOPRIM) 100 MG TABLET    Take 100 mg by mouth daily.   AMLODIPINE (NORVASC) 5 MG TABLET    Take 1.5 tablets (7.5 mg total) by mouth daily. At lunch   B COMPLEX VITAMINS CAPSULE    Take 1 capsule by mouth daily.    CHOLECALCIFEROL (VITAMIN D3) 2000 UNITS TABS    Take 2,000 Units by mouth daily.    CLOPIDOGREL (PLAVIX) 75 MG TABLET    TAKE 1 TABLET BY MOUTH ONCE DAILY FOR ANTICOAGULATION.   FINASTERIDE (PROSCAR) 5 MG TABLET    TAKE 1 TABLET  BY MOUTH ONCE DAILY TO HELP SHRINK PROSTATE.   FUROSEMIDE (LASIX) 20 MG TABLET    Take 1 tablet (20 mg total) by mouth daily as needed for edema.   METOPROLOL TARTRATE (LOPRESSOR) 25 MG TABLET    Take 1 tablet (25 mg total) by mouth 2 (two) times daily.   MULTIPLE VITAMINS-MINERALS (PRESERVISION AREDS 2 PO)    Take 1 tablet by mouth daily.   PRIMIDONE (MYSOLINE) 50 MG TABLET    TAKE 1 TABLET BY MOUTH ONCE DAILY IN THE EVENING TO HELP WITH TREMORS.  Modified Medications   No medications on file  Discontinued Medications   ALLOPURINOL (ZYLOPRIM) 100 MG TABLET    Take 2 tablets (200 mg total) by mouth daily.     Physical Exam:  Vitals:   10/22/18 1424  BP: 120/70  Pulse: 80  Temp: (!) 97.4 F (36.3 C)  TempSrc: Oral   There is no height or weight on file to calculate BMI.  Physical Exam Constitutional:      Appearance: He is well-developed.     Comments: Frail appearing in NAD  Eyes:     General: No scleral icterus.    Pupils: Pupils are equal, round, and reactive to light.  Neck:     Musculoskeletal: Neck supple.     Thyroid: No thyromegaly.     Vascular: No carotid bruit.  Cardiovascular:     Rate and Rhythm: Normal rate and regular rhythm.     Heart sounds: Murmur (1/6 SEM) present. No friction rub. No gallop.   Pulmonary:     Effort: Pulmonary effort is normal.     Breath sounds: Normal breath sounds. No wheezing or rales.  Chest:     Chest wall: No tenderness.  Abdominal:     General: Bowel sounds are  normal. There is no distension or abdominal bruit.     Palpations: Abdomen is soft. There is no hepatomegaly, mass or pulsatile mass.     Tenderness: There is no abdominal tenderness. There is no guarding or rebound.  Musculoskeletal:        General: Deformity (to bilateral hands, contractures) present.     Right lower leg: Edema (1+) present.     Left lower leg: Edema (trace) present.  Lymphadenopathy:     Cervical: No cervical adenopathy.  Skin:    General: Skin is warm and dry.     Findings: No rash.  Neurological:     Mental Status: He is alert and oriented to person, place, and time.     Deep Tendon Reflexes: Reflexes are normal and symmetric.  Psychiatric:        Behavior: Behavior normal.        Thought Content: Thought content normal.        Judgment: Judgment normal.     Labs reviewed: Basic Metabolic Panel: Recent Labs    03/28/18 0628 03/30/18 0444 07/16/18 1115  NA 140 139 135  K 4.6 5.0 5.1  CL 107 107 103  CO2 26 24 24   GLUCOSE 101* 99 91  BUN 44* 56* 52*  CREATININE 2.14* 2.31* 2.16*  CALCIUM 8.9 8.7* 9.1  TSH 2.130  --   --    Liver Function Tests: Recent Labs    12/31/17 1219 02/26/18 1215 07/16/18 1115  AST 19 24 31   ALT 19 30 37  BILITOT 0.5 0.5 0.3  PROT 6.5 6.4 6.5   No results for input(s): LIPASE, AMYLASE in the last 8760 hours. No results for input(s):  AMMONIA in the last 8760 hours. CBC: Recent Labs    12/31/17 1219 03/27/18 1435 03/28/18 0628 07/16/18 1115  WBC 6.9 7.5 6.7 7.1  NEUTROABS 3,933  --   --  4,303  HGB 13.9 12.9* 12.8* 12.8*  HCT 40.2 42.1 40.7 38.0*  MCV 95.0 105.0* 103.3* 94.8  PLT 163 113* 112* 166   Lipid Panel: No results for input(s): CHOL, HDL, LDLCALC, TRIG, CHOLHDL, LDLDIRECT in the last 8760 hours. TSH: Recent Labs    03/28/18 0628  TSH 2.130   A1C: No results found for: HGBA1C   Assessment/Plan 1. Bilateral leg edema Has been taking lasix 20 mg every other day with improvement. To continue  over the next few days as needed for edema. Continue compression hose, with elevatation. will follow up lab at this time  - BASIC METABOLIC PANEL WITH GFR - furosemide (LASIX) 20 MG tablet; Take 1 tablet (20 mg total) by mouth daily as needed for edema.  Dispense: 30 tablet; Refill: 0  2. Gout, unspecified cause, unspecified chronicity, unspecified site -without recent flare, allopurinol has been renally adjusted.  - Uric acid  3. Stage 4 chronic kidney disease ([place]) Following with nephrology, will follow up BMP today  4. Essential hypertension Worsening blood pressure over the last few days, stable in office today. Will continue current regimen. - amLODipine (NORVASC) 5 MG tablet; Take 1.5 tablets (7.5 mg total) by mouth daily. At lunch  Dispense: 45 tablet; Refill: 3  5. Shortness of breath Ongoing and stable at this time.   6. Hx of transient ischemic attack (TIA) -continues on plavix, without recurrent symptoms.   7. PSA elevation -symptoms stable on current regimen - finasteride (PROSCAR) 5 MG tablet; TAKE 1 TABLET BY MOUTH ONCE DAILY TO HELP SHRINK PROSTATE.  Dispense: 90 tablet; Refill: 1  8. Need for shingles vaccine - Zoster Vaccine Adjuvanted [facility]) injection; Inject 0.5 mLs into the muscle once for 1 dose.  Dispense: 0.5 mL; Refill: 1  Next appt: 3 months. Carlos American. [place], [facility] [phone]

## 2018-10-22 NOTE — Patient Instructions (Signed)
To continue lasix 20 mg every other day   Follow up in 3 months for routine follow up

## 2018-10-23 LAB — BASIC METABOLIC PANEL WITH GFR
BUN/Creatinine Ratio: 23 (calc) — ABNORMAL HIGH (ref 6–22)
BUN: 51 mg/dL — ABNORMAL HIGH (ref 7–25)
CALCIUM: 9 mg/dL (ref 8.6–10.3)
CO2: 24 mmol/L (ref 20–32)
Chloride: 102 mmol/L (ref 98–110)
Creat: 2.23 mg/dL — ABNORMAL HIGH (ref 0.70–1.11)
GFR, Est African American: 30 mL/min/{1.73_m2} — ABNORMAL LOW (ref >=60)
GFR, Est Non African American: 26 mL/min/{1.73_m2} — ABNORMAL LOW (ref >=60)
Glucose, Bld: 74 mg/dL (ref 65–139)
Potassium: 5.1 mmol/L (ref 3.5–5.3)
Sodium: 137 mmol/L (ref 135–146)

## 2018-10-23 LAB — URIC ACID: Uric Acid, Serum: 8.8 mg/dL — ABNORMAL HIGH (ref 4.0–8.0)

## 2018-10-27 ENCOUNTER — Other Ambulatory Visit: Payer: Self-pay | Admitting: *Deleted

## 2018-10-27 DIAGNOSIS — R600 Localized edema: Secondary | ICD-10-CM

## 2018-10-27 MED ORDER — FUROSEMIDE 20 MG PO TABS: 20.0000 mg | ORAL_TABLET | Freq: Every day | ORAL | 1 refills | Status: DC | PRN

## 2018-10-27 NOTE — Telephone Encounter (Signed)
Patient wife requested refill. Faxed.

## 2018-11-03 DIAGNOSIS — M79672 Pain in left foot: Secondary | ICD-10-CM | POA: Diagnosis not present

## 2018-11-03 DIAGNOSIS — L84 Corns and callosities: Secondary | ICD-10-CM | POA: Diagnosis not present

## 2018-11-03 DIAGNOSIS — M79671 Pain in right foot: Secondary | ICD-10-CM | POA: Diagnosis not present

## 2018-11-03 DIAGNOSIS — B351 Tinea unguium: Secondary | ICD-10-CM | POA: Diagnosis not present

## 2018-11-24 DIAGNOSIS — N184 Chronic kidney disease, stage 4 (severe): Secondary | ICD-10-CM | POA: Diagnosis not present

## 2018-11-28 DIAGNOSIS — L821 Other seborrheic keratosis: Secondary | ICD-10-CM | POA: Diagnosis not present

## 2018-11-28 DIAGNOSIS — Z85828 Personal history of other malignant neoplasm of skin: Secondary | ICD-10-CM | POA: Diagnosis not present

## 2018-11-28 DIAGNOSIS — D485 Neoplasm of uncertain behavior of skin: Secondary | ICD-10-CM | POA: Diagnosis not present

## 2018-11-28 DIAGNOSIS — L819 Disorder of pigmentation, unspecified: Secondary | ICD-10-CM | POA: Diagnosis not present

## 2018-11-28 DIAGNOSIS — L570 Actinic keratosis: Secondary | ICD-10-CM | POA: Diagnosis not present

## 2018-12-10 ENCOUNTER — Encounter: Payer: Self-pay | Admitting: Nurse Practitioner

## 2018-12-10 ENCOUNTER — Ambulatory Visit
Admission: RE | Admit: 2018-12-10 | Discharge: 2018-12-10 | Disposition: A | Discharge status: Home / Self Care | Payer: Medicare Other | Source: Ambulatory Visit | Attending: Nurse Practitioner | Admitting: Nurse Practitioner

## 2018-12-10 ENCOUNTER — Telehealth: Payer: Self-pay | Admitting: Internal Medicine

## 2018-12-10 ENCOUNTER — Ambulatory Visit (INDEPENDENT_AMBULATORY_CARE_PROVIDER_SITE_OTHER): Payer: Medicare Other | Admitting: Nurse Practitioner

## 2018-12-10 ENCOUNTER — Other Ambulatory Visit: Payer: Self-pay

## 2018-12-10 VITALS — BP 126/78 | HR 72 | Temp 97.7°F | Ht 68.0 in | Wt 187.0 lb

## 2018-12-10 DIAGNOSIS — I5032 Chronic diastolic (congestive) heart failure: Secondary | ICD-10-CM | POA: Diagnosis not present

## 2018-12-10 DIAGNOSIS — N184 Chronic kidney disease, stage 4 (severe): Secondary | ICD-10-CM

## 2018-12-10 DIAGNOSIS — R600 Localized edema: Secondary | ICD-10-CM

## 2018-12-10 DIAGNOSIS — J90 Pleural effusion, not elsewhere classified: Secondary | ICD-10-CM | POA: Diagnosis not present

## 2018-12-10 DIAGNOSIS — R0602 Shortness of breath: Secondary | ICD-10-CM | POA: Diagnosis not present

## 2018-12-10 MED ORDER — FUROSEMIDE 20 MG PO TABS: 20.0000 mg | ORAL_TABLET | Freq: Every day | ORAL | 2 refills | Status: DC

## 2018-12-10 NOTE — Patient Instructions (Addendum)
To get lab work and chest xray   To take lasix 40 mg daily then start lasix 20 mg daily   To follow up with cardiologist   Dr. Fletcher Anon or Dr. Gwenlyn Found in 3 months. [place] [facility] [address] [place], [place] [address] [phone]

## 2018-12-10 NOTE — Telephone Encounter (Signed)
Pt c/o Shortness Of Breath: STAT if SOB developed within the last 24 hours or pt is noticeably SOB on the phone  1. Are you currently SOB (can you hear that pt is SOB on the phone)?  Not at this time  2. How long have you been experiencing SOB?getting worse in the last week or two  3. Are you SOB when sitting or when up moving around?  When he walks aound  4. Are you currently experiencing any other symptoms? headache sometimes- I made pt an appt for tomorrow with Dr Irish Lack

## 2018-12-10 NOTE — Progress Notes (Signed)
Careteam: Patient Care Team: [name], NP as PCP - General (Geriatric Medicine) [name], MD as Consulting Physician (Nephrology)  Advanced Directive information    Allergies  Allergen Reactions  . Aleve [Naproxen Sodium] Other (See Comments)    Upset stomach   . Antihistamines, Diphenhydramine-Type Other (See Comments)    Causes urinary issues  . Dimenhydrinate Other (See Comments)    Causes urinary issues Causes urinary issues Causes urinary issues Causes urinary issues  . Nsaids     upset stomach  . Tolmetin Other (See Comments)    Bother his stomach Bother his stomach    Chief Complaint  Patient presents with  . Acute Visit    Increased coughing and SOB   . FYI    Patient with significant weight gain since November 2019      HPI: Patient is a 85 y.o. male seen in the office today due to worsening shortness of breath and cough. Does not feel sick or that he has a cold Pt reports he has significant shortness of breath with just getting ready for bed.  Increase activity intolerance. Doing lasix every other day but swelling persist.  Saw Nephrologist and doing lasix every other day.  Renal function was stable at last check. Following up again with him in April.  LE swelling persist- this has been chronic the last 6 month or more. Attempts to elevate legs but not above the level of his heart.  Lays on right side with 2 pillows under him. This has been consistent since last year. Wife reports face is puffy. No changes in diet or oral intake. He eats well but no increase. Low sodium diet. Weight up from 162 lbs to 187 lbs.   Wife questions if he has COPD   Review of Systems:  Review of Systems  Constitutional: Positive for malaise/fatigue. Negative for chills and fever.  Respiratory: Positive for cough and shortness of breath. Negative for sputum production and wheezing.   Cardiovascular: Positive for leg swelling. Negative for chest pain,  palpitations and orthopnea.  Gastrointestinal: Negative for abdominal pain.  Genitourinary: Negative for dysuria, frequency and urgency.  Musculoskeletal: Negative for myalgias.   Past Medical History:  Diagnosis Date  . Abnormality of gait   . Benign neoplasm of colon   . Benign prostate hyperplasia   . Chronic kidney disease, stage IV (severe) ([place])   . Closed fracture of ramus of right pubis ([place])   . Complication of anesthesia    postop headache after general anesthesia  . Degenerative arthritis   . Diverticulosis of colon (without mention of hemorrhage)   . Duodenal ulcer, unspecified as acute or chronic, without hemorrhage, perforation, or obstruction   . Encounter for long-term (current) use of other medications   . Enlarged prostate    takes Tamsulosin daily  . Essential and other specified forms of tremor   . Foley catheter in place 09-01-13   4 months now  . Foot drop, bilateral   . Gait disorder    balance issues-uses walker or mobile chair.  . Gout    takes Allopurinol daily  . Gout, unspecified   . Hyperlipidemia    but doesn't require meds  . Hypertension    takes Enalapril daily   . Inguinal hernia without mention of obstruction or gangrene, unilateral or unspecified, (not specified as recurrent)   . Internal hemorrhoids without mention of complication   . Macular degeneration    takes eye cap vits daily  .  Other abnormal blood chemistry   . PAD (peripheral artery disease) ([place])   . Peripheral neuropathy   . Peripheral neuropathy   . Pneumonia    hx of;last time 8'14-mild.  . Ptosis    Left side  . Spinal stenosis, lumbar region, with neurogenic claudication   . Syncope   . TIA (transient ischemic attack)    sees Dr.Willis-neurologist 6'14(strange feeling of head)-no residual  . Unspecified vitamin D deficiency   . Urinary frequency   . Urinary urgency   . UTI (lower urinary tract infection) 09-01-13   tx. 2 weeks ago   Past Surgical History:   Procedure Laterality Date  . CATARACT EXTRACTION Right 08/31/2014   Dr.Lyles  . colonosocpy    . FINGER SURGERY Left    Left ring finger  . HERNIA REPAIR     at least 21yrs ago  . HIP PINNING,CANNULATED Left 05/05/2013   Procedure: CANNULATED HIP PINNING- left;  Surgeon: Renette Butters, MD;  Location: [facility];  Service: Orthopedics;  Laterality: Left;  . INGUINAL HERNIA REPAIR  09/05/2012   Procedure: LAPAROSCOPIC INGUINAL HERNIA;  Surgeon: Gayland Curry, MD,FACS;  Location: [facility];  Service: General;  Laterality: Left;  . INSERTION OF MESH  09/05/2012   Procedure: INSERTION OF MESH;  Surgeon: Gayland Curry, MD,FACS;  Location: [facility];  Service: General;  Laterality: Left;  . left leg surgery  2011  . NERVE BIOPSY    . TRANSURETHRAL RESECTION OF PROSTATE N/A 09/07/2013   Procedure: CYSTO TRANSURETHRAL RESECTION OF THE PROSTATE (TURP);  Surgeon: Reece Packer, MD;  [facility];  Service: Urology;  Laterality: N/A;   Social History:   reports that he quit smoking about 33 years ago. His smoking use included cigarettes. He has a 40.00 pack-year smoking history. He quit smokeless tobacco use about 17 years ago. He reports that he does not drink alcohol or use drugs.  Family History  Problem Relation Age of Onset  . Diabetes Mother        type 2  . Clotting disorder Mother        deceased, blood clot  . Leukemia Father   . COPD Brother   . Lung cancer Sister   . Heart disease Brother        1/2 brother  . COPD Brother     Medications: Patient's Medications  New Prescriptions   No medications on file  Previous Medications   ACETAMINOPHEN (TYLENOL) 500 MG TABLET    Take 500-1,000 mg by mouth every 6 (six) hours as needed.   ALLOPURINOL (ZYLOPRIM) 100 MG TABLET    Take 200 mg by mouth daily. Taking 2 tabs   AMLODIPINE (NORVASC) 5 MG TABLET    Take 1.5 tablets (7.5 mg total) by mouth daily. At lunch   B COMPLEX VITAMINS CAPSULE    Take 1 capsule by mouth daily.     CHOLECALCIFEROL (VITAMIN D3) 2000 UNITS TABS    Take 2,000 Units by mouth daily.    CLOPIDOGREL (PLAVIX) 75 MG TABLET    TAKE 1 TABLET BY MOUTH ONCE DAILY FOR ANTICOAGULATION.   FINASTERIDE (PROSCAR) 5 MG TABLET    TAKE 1 TABLET BY MOUTH ONCE DAILY TO HELP SHRINK PROSTATE.   FUROSEMIDE (LASIX) 20 MG TABLET    Take 20 mg by mouth every other day.   METOPROLOL TARTRATE (LOPRESSOR) 25 MG TABLET    Take 1 tablet (25 mg total) by mouth 2 (two) times daily.   MULTIPLE  VITAMINS-MINERALS (PRESERVISION AREDS 2 PO)    Take 1 tablet by mouth daily.   PRIMIDONE (MYSOLINE) 50 MG TABLET    TAKE 1 TABLET BY MOUTH ONCE DAILY IN THE EVENING TO HELP WITH TREMORS.  Modified Medications   No medications on file  Discontinued Medications   FUROSEMIDE (LASIX) 20 MG TABLET    Take 1 tablet (20 mg total) by mouth daily as needed for edema.     Physical Exam:  Vitals:   12/10/18 0907  BP: 126/78  Pulse: 72  Temp: 97.7 F (36.5 C)  TempSrc: Oral  SpO2: 90%  Weight: 187 lb (84.8 kg)  Height: 5\' 8"  (1.727 m)   Body mass index is 28.43 kg/m.  Physical Exam Constitutional:      Appearance: Normal appearance.  HENT:     Head: Normocephalic and atraumatic.  Eyes:     Pupils: Pupils are equal, round, and reactive to light.  Neck:     Musculoskeletal: Normal range of motion.     Vascular: JVD present.  Cardiovascular:     Rate and Rhythm: Normal rate and regular rhythm.  Pulmonary:     Effort: Pulmonary effort is normal.     Comments: Diminished in the bases Abdominal:     General: Abdomen is flat.     Palpations: Abdomen is soft.  Musculoskeletal:     Right lower leg: 3+ Pitting Edema present.     Left lower leg: 3+ Pitting Edema present.  Neurological:     Mental Status: He is alert.     Labs reviewed: Basic Metabolic Panel: Recent Labs    03/28/18 0628 03/30/18 0444 07/16/18 1115 10/22/18 1512  NA 140 139 135 137  K 4.6 5.0 5.1 5.1  CL 107 107 103 102  CO2 26 24 24 24   GLUCOSE  101* 99 91 74  BUN 44* 56* 52* 51*  CREATININE 2.14* 2.31* 2.16* 2.23*  CALCIUM 8.9 8.7* 9.1 9.0  TSH 2.130  --   --   --    Liver Function Tests: Recent Labs    12/31/17 1219 02/26/18 1215 07/16/18 1115  AST 19 24 31   ALT 19 30 37  BILITOT 0.5 0.5 0.3  PROT 6.5 6.4 6.5   No results for input(s): LIPASE, AMYLASE in the last 8760 hours. No results for input(s): AMMONIA in the last 8760 hours. CBC: Recent Labs    12/31/17 1219 03/27/18 1435 03/28/18 0628 07/16/18 1115  WBC 6.9 7.5 6.7 7.1  NEUTROABS 3,933  --   --  4,303  HGB 13.9 12.9* 12.8* 12.8*  HCT 40.2 42.1 40.7 38.0*  MCV 95.0 105.0* 103.3* 94.8  PLT 163 113* 112* 166   Lipid Panel: No results for input(s): CHOL, HDL, LDLCALC, TRIG, CHOLHDL, LDLDIRECT in the last 8760 hours. TSH: Recent Labs    03/28/18 0628  TSH 2.130   A1C: No results found for: HGBA1C   Assessment/Plan 1. Chronic diastolic heart failure (H[facility]) -appears to have fluid overload due to weight gain, edema and JVD with increase shortness of breath with activity.  -will increase lasix to 40 mg by mouth today then 20 mg daily  -to follow up with cardiology at this time (has not had recent follow up) - DG Chest 2 View - Brain Natriuretic Peptide - CBC with Differential/Platelet - BASIC METABOLIC PANEL WITH GFR - furosemide (LASIX) 20 MG tablet; Take 1 tablet (20 mg total) by mouth daily.  Dispense: 30 tablet; Refill: 2  2. Stage 4 chronic  kidney disease ([place]) -ongoing follow up with nephrologist, does not wish to have work up or intervention for RAS - CBC with Differential/Platelet  3. Bilateral leg edema 3+ pitting edema that has not improved.  Continues on lasix, compresssion hose, encouraged elevation ABOVE level of the heart.  - furosemide (LASIX) 20 MG tablet; Take 1 tablet (20 mg total) by mouth daily.  Dispense: 30 tablet; Refill: 2  4. Shortness of breath -increase activity intolerance, appears to be fluid overloaded at this  time, see #1 - DG Chest 2 View - Brain Natriuretic Peptide - CBC with Differential/Platelet - BASIC METABOLIC PANEL WITH GFR - furosemide (LASIX) 20 MG tablet; Take 1 tablet (20 mg total) by mouth daily.  Dispense: 30 tablet; Refill: 2  Next appt: 1 week  Simeon Vera K. [place], [facility] [phone]

## 2018-12-10 NOTE — Progress Notes (Signed)
Cardiology Office Note   Date:  12/11/2018   ID:  Drew Gibson, DOB 23-Oct-1928, MRN [id number]  PCP:  [name], NP    No chief complaint on file.  SHOB  Wt Readings from Last 3 Encounters:  12/11/18 184 lb 12.8 oz (83.8 kg)  12/10/18 187 lb (84.8 kg)  08/22/18 162 lb (73.5 kg)       History of Present Illness: Drew Gibson is a 85 y.o. male  Who has had bilateral renal artery stenosis.  He has a history of hypertension, hyperlipidemia, chronic kidney disease stage IV , peripheral vascular disease, TIA, and syncope.   Drew Gibson has a long history of moderate chronic kidney disease that initially was monitored by his PCP and more recently Dr. Marval Regal.  Drew Gibson was treated with enalapril many years ago, though this was decreased in the setting of acute kidney injury while hospitalized.  His baseline creatinine had been 1.6-1.9, though earlier this year it trended up to 2.5 in the setting of fall and hospitalization that was complicated by acute diastolic heart failure.  Most recent creatinine a month ago was 1.95.  Drew Gibson reports reasonable urine output and minimal dependent edema.  He reports stable 2 pillow orthopnea.  In June 2019, Drew Gibson lost his balance and fell, leading to a nondisplaced pelvic fracture.  During his hospitalization he had shortness of breath and was noted to have diastolic heart failure with moderate pulmonary hypertension by echo.  He was diuresed with improvement in his symptoms.  Elevated blood pressures were also diagnosed around that time.  He had been given a diagnosis of hypertension many years ago but had not been on any therapy.  Over the last 2 to 3 months, he has been started on Toprol and amlodipine with improvement in his blood pressure.  He has not had any significant elevations over the last week.  Of note, Drew Gibson has had multiple falls over the last year and has also suffered bilateral hip fractures in the past.  Of late, he  has been more SHOB.  He had labs and a chest xray yesterday.  Cr increased to 2.5.  BP is followed at home.  Nothing over 540 mm Hg systolic recently.    BP meds have been adjusted.    He was taking Lasix 20 QOD.  Just changed to 20 mg daily by PMD yesterday.  He took 40 mg this morning.  He is putting out more urine than usual today.    He follows with Dr. Marval Regal for his renal care.  He does not know of any scheduled labs at this time.   Past Medical History:  Diagnosis Date  . Abnormality of gait   . Benign neoplasm of colon   . Benign prostate hyperplasia   . Chronic kidney disease, stage IV (severe) ([place])   . Closed fracture of ramus of right pubis ([place])   . Complication of anesthesia    postop headache after general anesthesia  . Degenerative arthritis   . Diverticulosis of colon (without mention of hemorrhage)   . Duodenal ulcer, unspecified as acute or chronic, without hemorrhage, perforation, or obstruction   . Encounter for long-term (current) use of other medications   . Enlarged prostate    takes Tamsulosin daily  . Essential and other specified forms of tremor   . Foley catheter in place 09-01-13   4 months now  . Foot drop, bilateral   . Gait  disorder    balance issues-uses walker or mobile chair.  . Gout    takes Allopurinol daily  . Gout, unspecified   . Hyperlipidemia    but doesn't require meds  . Hypertension    takes Enalapril daily   . Inguinal hernia without mention of obstruction or gangrene, unilateral or unspecified, (not specified as recurrent)   . Internal hemorrhoids without mention of complication   . Macular degeneration    takes eye cap vits daily  . Other abnormal blood chemistry   . PAD (peripheral artery disease) ([place])   . Peripheral neuropathy   . Peripheral neuropathy   . Pneumonia    hx of;last time 8'14-mild.  . Ptosis    Left side  . Spinal stenosis, lumbar region, with neurogenic claudication   . Syncope   . TIA  (transient ischemic attack)    sees Dr.Willis-neurologist 6'14(strange feeling of head)-no residual  . Unspecified vitamin D deficiency   . Urinary frequency   . Urinary urgency   . UTI (lower urinary tract infection) 09-01-13   tx. 2 weeks ago    Past Surgical History:  Procedure Laterality Date  . CATARACT EXTRACTION Right 08/31/2014   Dr.Lyles  . colonosocpy    . FINGER SURGERY Left    Left ring finger  . HERNIA REPAIR     at least 39yrs ago  . HIP PINNING,CANNULATED Left 05/05/2013   Procedure: CANNULATED HIP PINNING- left;  Surgeon: Renette Butters, MD;  Location: [facility];  Service: Orthopedics;  Laterality: Left;  . INGUINAL HERNIA REPAIR  09/05/2012   Procedure: LAPAROSCOPIC INGUINAL HERNIA;  Surgeon: Gayland Curry, MD,FACS;  Location: [facility];  Service: General;  Laterality: Left;  . INSERTION OF MESH  09/05/2012   Procedure: INSERTION OF MESH;  Surgeon: Gayland Curry, MD,FACS;  Location: [facility];  Service: General;  Laterality: Left;  . left leg surgery  2011  . NERVE BIOPSY    . TRANSURETHRAL RESECTION OF PROSTATE N/A 09/07/2013   Procedure: CYSTO TRANSURETHRAL RESECTION OF THE PROSTATE (TURP);  Surgeon: Reece Packer, MD;  Location: [facility];  Service: Urology;  Laterality: N/A;     Current Outpatient Medications  Medication Sig Dispense Refill  . acetaminophen (TYLENOL) 500 MG tablet Take 500-1,000 mg by mouth every 6 (six) hours as needed.    Marland Kitchen allopurinol (ZYLOPRIM) 100 MG tablet Take 200 mg by mouth daily. Taking 2 tabs    . amLODipine (NORVASC) 5 MG tablet Take 1.5 tablets (7.5 mg total) by mouth daily. At lunch 45 tablet 3  . b complex vitamins capsule Take 1 capsule by mouth daily.     . Cholecalciferol (VITAMIN D3) 2000 UNITS TABS Take 2,000 Units by mouth daily.     . clopidogrel (PLAVIX) 75 MG tablet TAKE 1 TABLET BY MOUTH ONCE DAILY FOR ANTICOAGULATION. 90 tablet 0  . finasteride (PROSCAR) 5 MG tablet TAKE 1 TABLET BY MOUTH ONCE DAILY TO HELP SHRINK PROSTATE. 90  tablet 1  . Fluorouracil (EFUDEX EX) Apply 1 application topically daily. For 4 weeks    . furosemide (LASIX) 20 MG tablet Take 1 tablet (20 mg total) by mouth daily. 30 tablet 2  . metoprolol tartrate (LOPRESSOR) 25 MG tablet Take 1 tablet (25 mg total) by mouth 2 (two) times daily. 180 tablet 3  . Multiple Vitamins-Minerals (PRESERVISION AREDS 2 PO) Take 1 tablet by mouth daily.    . primidone (MYSOLINE) 50 MG tablet TAKE 1 TABLET BY MOUTH ONCE DAILY  IN THE EVENING TO HELP WITH TREMORS. 90 tablet 1   No current facility-administered medications for this visit.     Allergies:   Aleve [naproxen sodium]; Antihistamines, diphenhydramine-type; Dimenhydrinate; Nsaids; and Tolmetin    Social History:  The patient  reports that he quit smoking about 33 years ago. His smoking use included cigarettes. He has a 40.00 pack-year smoking history. He quit smokeless tobacco use about 17 years ago. He reports that he does not drink alcohol or use drugs.   Family History:  The patient's family history includes COPD in his brother and brother; Clotting disorder in his mother; Diabetes in his mother; Heart disease in his brother; Leukemia in his father; Lung cancer in his sister.    ROS:  Please see the history of present illness.   Otherwise, review of systems are positive for LE edema.   All other systems are reviewed and negative.    PHYSICAL EXAM: VS:  BP 126/78   Pulse 72   Ht 5\' 8"  (1.727 m)   Wt 184 lb 12.8 oz (83.8 kg)   SpO2 95%   BMI 28.10 kg/m  , BMI Body mass index is 28.1 kg/m. GEN: Well nourished, well developed, in no acute distress  HEENT: normal  Neck: no JVD, carotid bruits, or masses Cardiac: RRR; no murmurs, rubs, or gallops,; bilateral LE edema  Respiratory:  clear to auscultation bilaterally, normal work of breathing GI: soft, nontender, nondistended, + BS MS: no deformity or atrophy  Skin: warm and dry, no rash Neuro:  Strength and sensation are intact Psych: euthymic  mood, full affect   Recent Labs: 03/28/2018: TSH 2.130 07/16/2018: ALT 37 12/10/2018: Brain Natriuretic Peptide 1,709; BUN 61; Creat 2.54; Hemoglobin 12.9; Platelets 120; Potassium 4.9; Sodium 138   Lipid Panel    Component Value Date/Time   CHOL 279 (H) 12/03/2016 0908   CHOL 286 (H) 09/27/2015 0944   TRIG 109 12/03/2016 0908   HDL 62 12/03/2016 0908   HDL 55 09/27/2015 0944   CHOLHDL 4.5 12/03/2016 0908   VLDL 22 12/03/2016 0908   LDLCALC 195 (H) 12/03/2016 0908   LDLCALC 207 (H) 09/27/2015 0944     Other studies Reviewed: Additional studies/ records that were reviewed today with results demonstrating: Labs reviewed.   ASSESSMENT AND PLAN:  1. Chronic diastolic heart failure: Elevated BNP.  Continue Lasix 20 mg daily.  He may need a higher dose going forward given his chronic renal insufficiency.  We will have to monitor his renal function while he is on diuretics. 2. Chronic renal insufficiency: Stage 4.  Creatinine slightly increased yesterday at 2.5 compared to his baseline.  I would suggest that he have his electrolytes rechecked in about a week after he has been on the daily Lasix. 3. Shortness of breath: I think this is multifactorial.  He is quite deconditioned given his injuries that he suffered in 2019 from his falls.  He has a small pleural effusion by x-ray.  Oxygen saturation is normal at rest.  He certainly does not require hospital admission at this time.  Can attempt diuresis.  Not many more options available to try to help with shortness of breath.  He also has pulmonary hypertension based on prior echocardiogram.  If additional diuresis improves his symptoms, can continue this plan of treatment while watching renal function. 4. Renal artery stenosis: He is not interested in any intervention.  His blood pressure is controlled.  I do not think there is any need for intervention  at this time.   Current medicines are reviewed at length with the patient today.  The  patient concerns regarding his medicines were addressed.  The following changes have been made:  No change  Labs/ tests ordered today include:  No orders of the defined types were placed in this encounter.   Recommend 150 minutes/week of aerobic exercise Low fat, low carb, high fiber diet recommended  Disposition:   FU in 3 months   Signed, Larae Grooms, MD  12/11/2018 1:40 PM    [facility] [place], [place], [place]  [address] Phone: [phone]; Fax: [phone]

## 2018-12-10 NOTE — Telephone Encounter (Signed)
I spoke to the patient's wife who said that he had become very SOB over the past few weeks, no CP. The schedulers had made him an appt on 3/12 with Dr Irish Lack, which I reviewed with the family.  They verbalized understanding and will go to the ED if symptoms worsen.  He was a patient of Dr Darnelle Bos in the past.

## 2018-12-11 ENCOUNTER — Encounter: Payer: Self-pay | Admitting: Interventional Cardiology

## 2018-12-11 ENCOUNTER — Ambulatory Visit: Payer: Medicare Other | Admitting: Interventional Cardiology

## 2018-12-11 VITALS — BP 126/78 | HR 72 | Ht 68.0 in | Wt 184.8 lb

## 2018-12-11 DIAGNOSIS — R0600 Dyspnea, unspecified: Secondary | ICD-10-CM

## 2018-12-11 DIAGNOSIS — N184 Chronic kidney disease, stage 4 (severe): Secondary | ICD-10-CM

## 2018-12-11 DIAGNOSIS — I701 Atherosclerosis of renal artery: Secondary | ICD-10-CM

## 2018-12-11 DIAGNOSIS — I5032 Chronic diastolic (congestive) heart failure: Secondary | ICD-10-CM

## 2018-12-11 DIAGNOSIS — R0609 Other forms of dyspnea: Secondary | ICD-10-CM

## 2018-12-11 LAB — CBC WITH DIFFERENTIAL/PLATELET
ABSOLUTE MONOCYTES: 416 {cells}/uL (ref 200–950)
Basophils Absolute: 32 cells/uL (ref 0–200)
Basophils Relative: 0.6 %
EOS PCT: 2 %
Eosinophils Absolute: 108 cells/uL (ref 15–500)
HEMATOCRIT: 38.6 % (ref 38.5–50.0)
HEMOGLOBIN: 12.9 g/dL — AB (ref 13.2–17.1)
Lymphs Abs: 1631 cells/uL (ref 850–3900)
MCH: 32.2 pg (ref 27.0–33.0)
MCHC: 33.4 g/dL (ref 32.0–36.0)
MCV: 96.3 fL (ref 80.0–100.0)
MPV: 11.4 fL (ref 7.5–12.5)
Monocytes Relative: 7.7 %
Neutro Abs: 3213 cells/uL (ref 1500–7800)
Neutrophils Relative %: 59.5 %
Platelets: 120 10*3/uL — ABNORMAL LOW (ref 140–400)
RBC: 4.01 10*6/uL — ABNORMAL LOW (ref 4.20–5.80)
RDW: 14.4 % (ref 11.0–15.0)
Total Lymphocyte: 30.2 %
WBC: 5.4 10*3/uL (ref 3.8–10.8)

## 2018-12-11 LAB — BASIC METABOLIC PANEL WITH GFR
BUN/Creatinine Ratio: 24 (calc) — ABNORMAL HIGH (ref 6–22)
BUN: 61 mg/dL — ABNORMAL HIGH (ref 7–25)
CO2: 25 mmol/L (ref 20–32)
Calcium: 9.2 mg/dL (ref 8.6–10.3)
Chloride: 104 mmol/L (ref 98–110)
Creat: 2.54 mg/dL — ABNORMAL HIGH (ref 0.70–1.11)
GFR, Est African American: 25 mL/min/{1.73_m2} — ABNORMAL LOW (ref >=60)
GFR, Est Non African American: 22 mL/min/{1.73_m2} — ABNORMAL LOW (ref >=60)
Glucose, Bld: 65 mg/dL (ref 65–139)
POTASSIUM: 4.9 mmol/L (ref 3.5–5.3)
Sodium: 138 mmol/L (ref 135–146)

## 2018-12-11 LAB — BRAIN NATRIURETIC PEPTIDE: Brain Natriuretic Peptide: 1709 pg/mL — ABNORMAL HIGH (ref <100)

## 2018-12-11 NOTE — Patient Instructions (Signed)
Medication Instructions:  Your physician recommends that you continue on your current medications as directed. Please refer to the Current Medication list given to you today.  If you need a refill on your cardiac medications before your next appointment, please call your pharmacy.   Lab work: None Ordered  If you have labs (blood work) drawn today and your tests are completely normal, you will receive your results only by: Marland Kitchen MyChart Message (if you have MyChart) OR . A paper copy in the mail If you have any lab test that is abnormal or we need to change your treatment, we will call you to review the results.  Testing/Procedures: None ordered  Follow-Up: . Your physician recommends that you schedule a follow-up appointment in: 2-3 months with Dr. Irish Lack . Your physician recommends that you follow-up with your nephrologist and have them recheck your electrolytes .   Any Other Special Instructions Will Be Listed Below (If Applicable).

## 2018-12-12 ENCOUNTER — Other Ambulatory Visit: Payer: Self-pay | Admitting: Nurse Practitioner

## 2018-12-12 ENCOUNTER — Telehealth: Payer: Self-pay | Admitting: *Deleted

## 2018-12-12 DIAGNOSIS — R911 Solitary pulmonary nodule: Secondary | ICD-10-CM

## 2018-12-12 NOTE — Telephone Encounter (Signed)
Pt's wife calling stating that they are not going to the get the CT scan done until after "the virus" is over because she heard people over 80 need to stay home. Just FYI

## 2018-12-12 NOTE — Telephone Encounter (Signed)
This is fine 

## 2018-12-16 ENCOUNTER — Telehealth: Payer: Self-pay | Admitting: *Deleted

## 2018-12-16 NOTE — Telephone Encounter (Signed)
Patient wife called and stated that patient's ankles and feet are swollen. Has been putting support hose on and sits most of the day with his feet up. Taking Furosemide 20mg  once daily. Wife is wanting to know if she should increase the Furosemide to 2/day. Doesn't feel bad and feels the SOB is alittle better.  Wants to know if scan ordered needs to be done right away or if this virus is over. She stated he Knows that if it Rik Wadel show something he probably would not do anything with the condition he is in.   Please Advise.

## 2018-12-17 ENCOUNTER — Ambulatory Visit: Payer: Medicare Other | Admitting: Nurse Practitioner

## 2018-12-17 NOTE — Telephone Encounter (Signed)
Patient wife notified and agreed.  

## 2018-12-17 NOTE — Telephone Encounter (Signed)
Lets continue the furosemide 20 mg daily at this time, CT is not urgent and we can wait on this.  Continue compression hose and elevating legs above level of heart

## 2018-12-26 ENCOUNTER — Other Ambulatory Visit: Payer: Self-pay | Admitting: Nurse Practitioner

## 2019-01-01 DIAGNOSIS — N184 Chronic kidney disease, stage 4 (severe): Secondary | ICD-10-CM | POA: Diagnosis not present

## 2019-01-01 DIAGNOSIS — I5030 Unspecified diastolic (congestive) heart failure: Secondary | ICD-10-CM | POA: Diagnosis not present

## 2019-01-01 DIAGNOSIS — I129 Hypertensive chronic kidney disease with stage 1 through stage 4 chronic kidney disease, or unspecified chronic kidney disease: Secondary | ICD-10-CM | POA: Diagnosis not present

## 2019-01-01 DIAGNOSIS — R809 Proteinuria, unspecified: Secondary | ICD-10-CM | POA: Diagnosis not present

## 2019-01-06 ENCOUNTER — Ambulatory Visit: Payer: Medicare Other | Admitting: Neurology

## 2019-01-07 ENCOUNTER — Other Ambulatory Visit: Payer: Self-pay | Admitting: Nurse Practitioner

## 2019-01-07 DIAGNOSIS — R911 Solitary pulmonary nodule: Secondary | ICD-10-CM

## 2019-01-10 ENCOUNTER — Other Ambulatory Visit: Payer: Self-pay | Admitting: Nurse Practitioner

## 2019-01-10 DIAGNOSIS — G250 Essential tremor: Secondary | ICD-10-CM

## 2019-01-21 ENCOUNTER — Encounter: Payer: Self-pay | Admitting: Nurse Practitioner

## 2019-01-21 ENCOUNTER — Ambulatory Visit (INDEPENDENT_AMBULATORY_CARE_PROVIDER_SITE_OTHER): Payer: Medicare Other | Admitting: Nurse Practitioner

## 2019-01-21 ENCOUNTER — Other Ambulatory Visit: Payer: Self-pay

## 2019-01-21 DIAGNOSIS — N400 Enlarged prostate without lower urinary tract symptoms: Secondary | ICD-10-CM | POA: Diagnosis not present

## 2019-01-21 DIAGNOSIS — G250 Essential tremor: Secondary | ICD-10-CM | POA: Diagnosis not present

## 2019-01-21 DIAGNOSIS — I10 Essential (primary) hypertension: Secondary | ICD-10-CM

## 2019-01-21 DIAGNOSIS — N184 Chronic kidney disease, stage 4 (severe): Secondary | ICD-10-CM

## 2019-01-21 DIAGNOSIS — M109 Gout, unspecified: Secondary | ICD-10-CM

## 2019-01-21 DIAGNOSIS — R600 Localized edema: Secondary | ICD-10-CM

## 2019-01-21 DIAGNOSIS — I5032 Chronic diastolic (congestive) heart failure: Secondary | ICD-10-CM | POA: Diagnosis not present

## 2019-01-21 MED ORDER — FINASTERIDE 5 MG PO TABS: ORAL_TABLET | ORAL | 1 refills | Status: AC

## 2019-01-21 NOTE — Progress Notes (Signed)
This service is provided via telemedicine  No vital signs collected/recorded due to the encounter was a telemedicine visit.   Location of patient (ex: home, work):  Home  Patient consents to a telephone visit:  Yes  Location of the provider (ex: office, home):  [facility], Office   Name of any referring provider: N/A  Names of all persons participating in the telemedicine service and their role in the encounter:  S.Chrae B/CMA, Sherrie Mustache, NP, and Patient   Time spent on call: 12 min with medical assistant   Virtual Visit via Telephone Note  I connected with Drew Gibson on 01/21/19 at 12:00 PM EDT by telephone and verified that I am speaking with the correct person using two identifiers.   I discussed the limitations, risks, security and privacy concerns of performing an evaluation and management service by telephone and the availability of in person appointments. I also discussed with the patient that there may be a patient responsible charge related to this service. The patient expressed understanding and agreed to proceed.     Careteam: Patient Care Team: [name], NP as PCP - General (Geriatric Medicine) [name], MD as Consulting Physician (Nephrology)  Advanced Directive information Does Patient Have a Medical Advance Directive?: Yes, Type of Advance Directive: [place];Living will;Out of facility DNR (pink MOST or yellow form), Does patient want to make changes to medical advance directive?: No - Guardian declined  Allergies  Allergen Reactions  . Aleve [Naproxen Sodium] Other (See Comments)    Upset stomach   . Antihistamines, Diphenhydramine-Type Other (See Comments)    Causes urinary issues  . Dimenhydrinate Other (See Comments)    Causes urinary issues Causes urinary issues Causes urinary issues Causes urinary issues  . Nsaids     upset stomach  . Tolmetin Other (See Comments)    Bother his  stomach Bother his stomach    Chief Complaint  Patient presents with  . Medical Management of Chronic Issues    3 month follow-up. Patient sleeping a lot lately, sleeps well at night. Televisit   . Immunizations    Discuss need for pneumonia and shingrix vaccine   . Medication Management    Kidney specialist stopped amlodipine due to swelling- FYI  . Cough    Ongoing cough      HPI: Patient is a 85 y.o. male for routine follow up.  Pt was having trouble with LE edema, went to cardiologist and nephrologist due to persistent edema. He ended up needing to take lasix 80 mg by mouth daily for 3 days which improved swelling and then decrease down to lasix 40 mg daily and now taking as needed for swelling.  Wearing compression hose but unable to elevate legs so they are dependant all day.  Getting up and walking to the bathroom more often when he increase fluid pill. Overall has increase weakness and fatigue.  Sleeping more during the day.   HTN- Norvasc was stopped due to LE edema, sbp 150-160 occasionally; most the time sbp is 130-140 115/71 HR 65 today Now taking metoprolol 25 mg twice daily   Eating well, trying to limit sodium due to swelling.   BPH-continues on proscar.   Hx of TIA- continues on plavix,   Tremor- continues on primidone 50 mg which controls symptoms. Following with Dr Jannifer Franklin who is planning on retiring.   Contractures- hard to hold onto things, progressively worsening. Hands stay cold.  Still able to  feed himself.   Gout- allopurinol increase to 200 mg by mouth daily (elevated uric acid level) will need follow up with next labs. No recent flares.   Review of Systems:  Review of Systems  Constitutional: Positive for malaise/fatigue. Negative for chills and fever.  Respiratory: Positive for cough (improved after fluid was removed but still persist). Negative for sputum production, shortness of breath and wheezing.   Cardiovascular: Negative for chest pain,  palpitations, orthopnea and leg swelling.  Gastrointestinal: Negative for abdominal pain.  Genitourinary: Positive for frequency. Negative for dysuria and urgency.  Musculoskeletal: Negative for joint pain and myalgias.  Neurological: Positive for tremors and weakness. Negative for dizziness and headaches.  Psychiatric/Behavioral: Negative for depression. The patient is not nervous/anxious and does not have insomnia.     Past Medical History:  Diagnosis Date  . Abnormality of gait   . Benign neoplasm of colon   . Benign prostate hyperplasia   . Chronic kidney disease, stage IV (severe) ([place])   . Closed fracture of ramus of right pubis ([place])   . Complication of anesthesia    postop headache after general anesthesia  . Degenerative arthritis   . Diverticulosis of colon (without mention of hemorrhage)   . Duodenal ulcer, unspecified as acute or chronic, without hemorrhage, perforation, or obstruction   . Encounter for long-term (current) use of other medications   . Enlarged prostate    takes Tamsulosin daily  . Essential and other specified forms of tremor   . Foley catheter in place 09-01-13   4 months now  . Foot drop, bilateral   . Gait disorder    balance issues-uses walker or mobile chair.  . Gout    takes Allopurinol daily  . Gout, unspecified   . Hyperlipidemia    but doesn't require meds  . Hypertension    takes Enalapril daily   . Inguinal hernia without mention of obstruction or gangrene, unilateral or unspecified, (not specified as recurrent)   . Internal hemorrhoids without mention of complication   . Macular degeneration    takes eye cap vits daily  . Other abnormal blood chemistry   . PAD (peripheral artery disease) ([place])   . Peripheral neuropathy   . Peripheral neuropathy   . Pneumonia    hx of;last time 8'14-mild.  . Ptosis    Left side  . Spinal stenosis, lumbar region, with neurogenic claudication   . Syncope   . TIA (transient ischemic attack)     sees Dr.Willis-neurologist 6'14(strange feeling of head)-no residual  . Unspecified vitamin D deficiency   . Urinary frequency   . Urinary urgency   . UTI (lower urinary tract infection) 09-01-13   tx. 2 weeks ago   Past Surgical History:  Procedure Laterality Date  . CATARACT EXTRACTION Right 08/31/2014   Dr.Lyles  . colonosocpy    . FINGER SURGERY Left    Left ring finger  . HERNIA REPAIR     at least 62yrs ago  . HIP PINNING,CANNULATED Left 05/05/2013   Procedure: CANNULATED HIP PINNING- left;  Surgeon: Renette Butters, MD;  Location: [place];  Service: Orthopedics;  Laterality: Left;  . INGUINAL HERNIA REPAIR  09/05/2012   Procedure: LAPAROSCOPIC INGUINAL HERNIA;  Surgeon: Gayland Curry, MD,FACS;  Location: [place];  Service: General;  Laterality: Left;  . INSERTION OF MESH  09/05/2012   Procedure: INSERTION OF MESH;  Surgeon: Gayland Curry, MD,FACS;  Location: [place];  Service: General;  Laterality: Left;  .  left leg surgery  2011  . NERVE BIOPSY    . TRANSURETHRAL RESECTION OF PROSTATE N/A 09/07/2013   Procedure: CYSTO TRANSURETHRAL RESECTION OF THE PROSTATE (TURP);  Surgeon: Reece Packer, MD;  Location: [facility];  Service: Urology;  Laterality: N/A;   Social History:   reports that he quit smoking about 33 years ago. His smoking use included cigarettes. He has a 40.00 pack-year smoking history. He quit smokeless tobacco use about 17 years ago.  His smokeless tobacco use included chew. He reports that he does not drink alcohol or use drugs.  Family History  Problem Relation Age of Onset  . Diabetes Mother        type 2  . Clotting disorder Mother        deceased, blood clot  . Leukemia Father   . COPD Brother   . Lung cancer Sister   . Heart disease Brother        1/2 brother  . COPD Brother     Medications: Patient's Medications  New Prescriptions   No medications on file  Previous Medications   ACETAMINOPHEN (TYLENOL) 500 MG TABLET    Take 500 mg by mouth at bedtime.     ALLOPURINOL (ZYLOPRIM) 100 MG TABLET    Take 200 mg by mouth daily.    B COMPLEX VITAMINS CAPSULE    Take 1 capsule by mouth daily.    CHOLECALCIFEROL (VITAMIN D3) 2000 UNITS TABS    Take 2,000 Units by mouth daily.    CLOPIDOGREL (PLAVIX) 75 MG TABLET    TAKE 1 TABLET BY MOUTH ONCE DAILY FOR ANTICOAGULATION.   FINASTERIDE (PROSCAR) 5 MG TABLET    TAKE 1 TABLET BY MOUTH ONCE DAILY TO HELP SHRINK PROSTATE.   FUROSEMIDE (LASIX) 20 MG TABLET    Take 20 mg by mouth as needed for edema. Ok to increase to 40 mg if swelling presents per kidney specialist   METOPROLOL TARTRATE (LOPRESSOR) 25 MG TABLET    Take 1 tablet (25 mg total) by mouth 2 (two) times daily.   MULTIPLE VITAMINS-MINERALS (PRESERVISION AREDS 2 PO)    Take 1 tablet by mouth daily.   PRIMIDONE (MYSOLINE) 50 MG TABLET    TAKE 1 TABLET BY MOUTH ONCE DAILY IN THE EVENING TO HELP WITH TREMORS.   TETRAHYDROZOLINE HCL (VISINE OP)    Apply to eye. As needed for dry eye  Modified Medications   No medications on file  Discontinued Medications   AMLODIPINE (NORVASC) 5 MG TABLET    Take 1.5 tablets (7.5 mg total) by mouth daily. At lunch   FLUOROURACIL (EFUDEX EX)    Apply 1 application topically daily. For 4 weeks   FUROSEMIDE (LASIX) 20 MG TABLET    Take 1 tablet (20 mg total) by mouth daily.     Physical Exam: 115/71 HR 65   Labs reviewed: Basic Metabolic Panel: Recent Labs    03/28/18 0628  07/16/18 1115 10/22/18 1512 12/10/18 0945  NA 140   < > 135 137 138  K 4.6   < > 5.1 5.1 4.9  CL 107   < > 103 102 104  CO2 26   < > 24 24 25   GLUCOSE 101*   < > 91 74 65  BUN 44*   < > 52* 51* 61*  CREATININE 2.14*   < > 2.16* 2.23* 2.54*  CALCIUM 8.9   < > 9.1 9.0 9.2  TSH 2.130  --   --   --   --    < > =  values in this interval not displayed.   Liver Function Tests: Recent Labs    02/26/18 1215 07/16/18 1115  AST 24 31  ALT 30 37  BILITOT 0.5 0.3  PROT 6.4 6.5   No results for input(s): LIPASE, AMYLASE in the last 8760  hours. No results for input(s): AMMONIA in the last 8760 hours. CBC: Recent Labs    03/28/18 0628 07/16/18 1115 12/10/18 0945  WBC 6.7 7.1 5.4  NEUTROABS  --  4,303 3,213  HGB 12.8* 12.8* 12.9*  HCT 40.7 38.0* 38.6  MCV 103.3* 94.8 96.3  PLT 112* 166 120*   Lipid Panel: No results for input(s): CHOL, HDL, LDLCALC, TRIG, CHOLHDL, LDLDIRECT in the last 8760 hours. TSH: Recent Labs    03/28/18 0628  TSH 2.130   A1C: No results found for: HGBA1C   Assessment/Plan 1. Benign prostatic hyperplasia, unspecified whether lower urinary tract symptoms present - finasteride (PROSCAR) 5 MG tablet; TAKE 1 TABLET BY MOUTH ONCE DAILY TO HELP SHRINK PROSTATE.  Dispense: 90 tablet; Refill: 1  2. Chronic diastolic heart failure (HCC) Stable, cough, shortness of breath and edema have improved. Continues on metoprolol and lasix  3. CKD (chronic kidney disease) stage 4, GFR 15-29 ml/min (HCC) -ongoing follow up with nephrologist   4. Benign essential tremor Ongoing, continues on primidone 50 mg daily   5. Hypertension, unspecified type Blood pressure has been stable. Continues on metoprolol twice daily  6. Bilateral leg edema Improved with increase in lasix.   7. Gout, unspecified cause, unspecified chronicity, unspecified site No recent flare, will need follow up uric acid level at next office visit. Continues on allopurinol 200 mg daily    Next appt: 04/23/2019 Carlos American. Harle Battiest  [facility] [phone]    Follow Up Instructions:    I discussed the assessment and treatment plan with the patient. The patient was provided an opportunity to ask questions and all were answered. The patient agreed with the plan and demonstrated an understanding of the instructions.   The patient was advised to call back or seek an in-person evaluation if the symptoms worsen or if the condition fails to improve as anticipated.  I provided 18 minutes of  non-face-to-face time during this encounter.   [name], NP

## 2019-02-17 ENCOUNTER — Ambulatory Visit (INDEPENDENT_AMBULATORY_CARE_PROVIDER_SITE_OTHER): Payer: Medicare Other | Admitting: Nurse Practitioner

## 2019-02-17 ENCOUNTER — Other Ambulatory Visit: Payer: Self-pay

## 2019-02-17 ENCOUNTER — Encounter: Payer: Self-pay | Admitting: Nurse Practitioner

## 2019-02-17 DIAGNOSIS — R600 Localized edema: Secondary | ICD-10-CM | POA: Diagnosis not present

## 2019-02-17 DIAGNOSIS — I5032 Chronic diastolic (congestive) heart failure: Secondary | ICD-10-CM | POA: Diagnosis not present

## 2019-02-17 DIAGNOSIS — L989 Disorder of the skin and subcutaneous tissue, unspecified: Secondary | ICD-10-CM | POA: Diagnosis not present

## 2019-02-17 DIAGNOSIS — N184 Chronic kidney disease, stage 4 (severe): Secondary | ICD-10-CM

## 2019-02-17 DIAGNOSIS — R911 Solitary pulmonary nodule: Secondary | ICD-10-CM

## 2019-02-17 NOTE — Progress Notes (Signed)
This service is provided via telemedicine  No vital signs collected/recorded due to the encounter was a telemedicine visit.   Location of patient (ex: home, work): Home  Patient consents to a telephone visit:  Yes  Location of the provider (ex: office, home):  [facility], Office   Name of any referring provider: N/A  Names of all persons participating in the telemedicine service and their role in the encounter:  S.Chrae B/CMA, Sherrie Mustache, NP, Mrs.Locastro and Patient   Time spent on call:  10 min      Careteam: Patient Care Team: [name], NP as PCP - General (Geriatric Medicine) [name], MD as Consulting Physician (Nephrology)  Advanced Directive information Does Patient Have a Medical Advance Directive?: Yes, Type of Advance Directive: [place];Living will;Out of facility DNR (pink MOST or yellow form), Pre-existing out of facility DNR order (yellow form or pink MOST form): Yellow form placed in chart (order not valid for inpatient use), Does patient want to make changes to medical advance directive?: No - Patient declined  Allergies  Allergen Reactions  . Aleve [Naproxen Sodium] Other (See Comments)    Upset stomach   . Antihistamines, Diphenhydramine-Type Other (See Comments)    Causes urinary issues  . Dimenhydrinate Other (See Comments)    Causes urinary issues Causes urinary issues Causes urinary issues Causes urinary issues  . Nsaids     upset stomach  . Tolmetin Other (See Comments)    Bother his stomach Bother his stomach    Chief Complaint  Patient presents with  . Acute Visit    Raised areas in different locations of concern(x 5-6 days). Decreased energy causing a dominio effect of decreased mobility. Patient also with ongoing chronic cough and questions if she should proceed with CT that was recommended in March. Telephone visit      HPI: Patient is a 85 y.o. male with multiple concerns.  4-5  small marble size bumps, 1 on the back and 2 on the front; on his shoulder blade and near his collar bone. Always there, have been there about a week, sore and itch when they started, now does not bother him. Flesh colored, wife sees the but he can not. He can feel them when she rubs over them. Does not really bother him. No new areas since he noticed them.   Cough- chronic cough, this week is worse (less than a week) Sounds looser. No fever. Pt reports it does not really bother him. Not productive. Not breathless with talking or with ambulation. Cough more when he gets up in the morning but then it is gone.  Swelling has improved in the last 3 weeks. After he got over the bad swelling it came down and has stayed better.  Inside of right ankle is the worse. Gave him lasix today Reports she is giving lasix and then skipping 2 days. Went up to 2 for a little while if needed.  Weight varies 160-166 lb. Has not been elevated recently.   Progressively more tired, no acute change.  Bath wears him out so he took a short nap this morning afterwards, otherwise feels like fatigue is at baseline.   Blood pressure 137/85, overall blood pressure has been better since off amlodipine.   Review of Systems:  Review of Systems  Constitutional: Negative for chills, fever and malaise/fatigue.  Respiratory: Positive for cough. Negative for sputum production, shortness of breath and wheezing.   Cardiovascular: Negative for chest  pain, palpitations, orthopnea and leg swelling.  Gastrointestinal: Negative for abdominal pain.  Genitourinary: Positive for frequency. Negative for dysuria and urgency.  Musculoskeletal: Negative for joint pain and myalgias.  Neurological: Positive for tremors and weakness (without change). Negative for dizziness and headaches.  Psychiatric/Behavioral: Negative for depression. The patient is not nervous/anxious and does not have insomnia.     Past Medical History:  Diagnosis Date  .  Abnormality of gait   . Benign neoplasm of colon   . Benign prostate hyperplasia   . Chronic kidney disease, stage IV (severe) ([place])   . Closed fracture of ramus of right pubis ([place])   . Complication of anesthesia    postop headache after general anesthesia  . Degenerative arthritis   . Diverticulosis of colon (without mention of hemorrhage)   . Duodenal ulcer, unspecified as acute or chronic, without hemorrhage, perforation, or obstruction   . Encounter for long-term (current) use of other medications   . Enlarged prostate    takes Tamsulosin daily  . Essential and other specified forms of tremor   . Foley catheter in place 09-01-13   4 months now  . Foot drop, bilateral   . Gait disorder    balance issues-uses walker or mobile chair.  . Gout    takes Allopurinol daily  . Gout, unspecified   . Hyperlipidemia    but doesn't require meds  . Hypertension    takes Enalapril daily   . Inguinal hernia without mention of obstruction or gangrene, unilateral or unspecified, (not specified as recurrent)   . Internal hemorrhoids without mention of complication   . Macular degeneration    takes eye cap vits daily  . Other abnormal blood chemistry   . PAD (peripheral artery disease) ([place])   . Peripheral neuropathy   . Peripheral neuropathy   . Pneumonia    hx of;last time 8'14-mild.  . Ptosis    Left side  . Spinal stenosis, lumbar region, with neurogenic claudication   . Syncope   . TIA (transient ischemic attack)    sees Dr.Willis-neurologist 6'14(strange feeling of head)-no residual  . Unspecified vitamin D deficiency   . Urinary frequency   . Urinary urgency   . UTI (lower urinary tract infection) 09-01-13   tx. 2 weeks ago   Past Surgical History:  Procedure Laterality Date  . CATARACT EXTRACTION Right 08/31/2014   Dr.Lyles  . colonosocpy    . FINGER SURGERY Left    Left ring finger  . HERNIA REPAIR     at least 23yrs ago  . HIP PINNING,CANNULATED Left 05/05/2013    Procedure: CANNULATED HIP PINNING- left;  Surgeon: Renette Butters, MD;  Location: [place];  Service: Orthopedics;  Laterality: Left;  . INGUINAL HERNIA REPAIR  09/05/2012   Procedure: LAPAROSCOPIC INGUINAL HERNIA;  Surgeon: Gayland Curry, MD,FACS;  Location: [place];  Service: General;  Laterality: Left;  . INSERTION OF MESH  09/05/2012   Procedure: INSERTION OF MESH;  Surgeon: Gayland Curry, MD,FACS;  Location: [place];  Service: General;  Laterality: Left;  . left leg surgery  2011  . NERVE BIOPSY    . TRANSURETHRAL RESECTION OF PROSTATE N/A 09/07/2013   Procedure: CYSTO TRANSURETHRAL RESECTION OF THE PROSTATE (TURP);  Surgeon: Reece Packer, MD;  Location: [facility];  Service: Urology;  Laterality: N/A;   Social History:   reports that he quit smoking about 33 years ago. His smoking use included cigarettes. He has a 40.00 pack-year smoking history.  He quit smokeless tobacco use about 18 years ago.  His smokeless tobacco use included chew. He reports that he does not drink alcohol or use drugs.  Family History  Problem Relation Age of Onset  . Diabetes Mother        type 2  . Clotting disorder Mother        deceased, blood clot  . Leukemia Father   . COPD Brother   . Lung cancer Sister   . Heart disease Brother        1/2 brother  . COPD Brother     Medications: Patient's Medications  New Prescriptions   No medications on file  Previous Medications   ACETAMINOPHEN (TYLENOL) 500 MG TABLET    Take 500 mg by mouth at bedtime.    ALLOPURINOL (ZYLOPRIM) 100 MG TABLET    Take 200 mg by mouth daily.    B COMPLEX VITAMINS CAPSULE    Take 1 capsule by mouth daily.    CHOLECALCIFEROL (VITAMIN D3) 2000 UNITS TABS    Take 2,000 Units by mouth daily.    CLOPIDOGREL (PLAVIX) 75 MG TABLET    TAKE 1 TABLET BY MOUTH ONCE DAILY FOR ANTICOAGULATION.   FINASTERIDE (PROSCAR) 5 MG TABLET    TAKE 1 TABLET BY MOUTH ONCE DAILY TO HELP SHRINK PROSTATE.   FUROSEMIDE (LASIX) 20 MG TABLET    Take 20 mg by  mouth as needed for edema. Ok to increase to 40 mg if swelling presents per kidney specialist   METOPROLOL TARTRATE (LOPRESSOR) 25 MG TABLET    Take 1 tablet (25 mg total) by mouth 2 (two) times daily.   MULTIPLE VITAMINS-MINERALS (PRESERVISION AREDS 2 PO)    Take 1 tablet by mouth daily.   PRIMIDONE (MYSOLINE) 50 MG TABLET    TAKE 1 TABLET BY MOUTH ONCE DAILY IN THE EVENING TO HELP WITH TREMORS.   TETRAHYDROZOLINE HCL (VISINE OP)    Apply to eye. As needed for dry eye  Modified Medications   No medications on file  Discontinued Medications   No medications on file     Physical Exam: unable due to televisit    Labs reviewed: Basic Metabolic Panel: Recent Labs    03/28/18 0628  07/16/18 1115 10/22/18 1512 12/10/18 0945  NA 140   < > 135 137 138  K 4.6   < > 5.1 5.1 4.9  CL 107   < > 103 102 104  CO2 26   < > 24 24 25   GLUCOSE 101*   < > 91 74 65  BUN 44*   < > 52* 51* 61*  CREATININE 2.14*   < > 2.16* 2.23* 2.54*  CALCIUM 8.9   < > 9.1 9.0 9.2  TSH 2.130  --   --   --   --    < > = values in this interval not displayed.   Liver Function Tests: Recent Labs    02/26/18 1215 07/16/18 1115  AST 24 31  ALT 30 37  BILITOT 0.5 0.3  PROT 6.4 6.5   No results for input(s): LIPASE, AMYLASE in the last 8760 hours. No results for input(s): AMMONIA in the last 8760 hours. CBC: Recent Labs    03/28/18 0628 07/16/18 1115 12/10/18 0945  WBC 6.7 7.1 5.4  NEUTROABS  --  4,303 3,213  HGB 12.8* 12.8* 12.9*  HCT 40.7 38.0* 38.6  MCV 103.3* 94.8 96.3  PLT 112* 166 120*   Lipid Panel: No results for input(s):  CHOL, HDL, LDLCALC, TRIG, CHOLHDL, LDLDIRECT in the last 8760 hours. TSH: Recent Labs    03/28/18 0628  TSH 2.130   A1C: No results found for: HGBA1C   Assessment/Plan 1. Chronic diastolic heart failure (HCC) Stable, notes cough but without weight gain, shortness of breath or chest pains. Continues on lasix as needed. Will monitor and notify for worsening of  symptoms.   2. CKD (chronic kidney disease) stage 4, GFR 15-29 ml/min (HCC) Continues to follow with nephrology.   3. Bumps on skin Continue to be present but no pain, itching, signs of infection noted. Wife continues to monitor and will notify if changes.   4. Bilateral leg edema Stable at this time. Has stopped amlodipine   5. Solitary pulmonary nodule Will call to set up CT scan when son is available to take him  Next appt:04/23/2019 Deshun Sedivy K. Harle Battiest  [facility] [phone]    Virtual Visit via Telephone Note  I connected with@ on 02/17/19 at  1:30 PM EDT by telephone and verified that I am speaking with the correct person using two identifiers.  Location: Patient: home Provider: office   I discussed the limitations, risks, security and privacy concerns of performing an evaluation and management service by telephone and the availability of in person appointments. I also discussed with the patient that there may be a patient responsible charge related to this service. The patient expressed understanding and agreed to proceed.   I discussed the assessment and treatment plan with the patient. The patient was provided an opportunity to ask questions and all were answered. The patient agreed with the plan and demonstrated an understanding of the instructions.   The patient was advised to call back or seek an in-person evaluation if the symptoms worsen or if the condition fails to improve as anticipated.  I provided 21 minutes of non-face-to-face time during this encounter.  Carlos American. Harle Battiest Avs printed and mailed

## 2019-02-18 ENCOUNTER — Telehealth: Payer: Self-pay

## 2019-02-18 NOTE — Telephone Encounter (Signed)
Virtual Visit Pre-Appointment Phone Call TELEPHONE CALL NOTE  Drew Gibson has been deemed a candidate for a follow-up tele-health visit to limit community exposure during the Covid-19 pandemic. I spoke with the patient via phone to ensure availability of phone/video source, confirm preferred email & phone number, and discuss instructions and expectations.  I reminded [name] to be prepared with any vital sign and/or heart rhythm information that could potentially be obtained via home monitoring, at the time of his visit. I reminded [name] to expect a phone call prior to his visit.  Patient agrees to consent below.  Cleon Gustin, RN 02/18/2019 2:11 PM   FULL LENGTH CONSENT FOR TELE-HEALTH VISIT   I hereby voluntarily request, consent and authorize [facility] and its employed or contracted physicians, physician assistants, nurse practitioners or other licensed health care professionals (the Practitioner), to provide me with telemedicine health care services (the "Services") as deemed necessary by the treating Practitioner. I acknowledge and consent to receive the Services by the Practitioner via telemedicine. I understand that the telemedicine visit will involve communicating with the Practitioner through live audiovisual communication technology and the disclosure of certain medical information by electronic transmission. I acknowledge that I have been given the opportunity to request an in-person assessment or other available alternative prior to the telemedicine visit and am voluntarily participating in the telemedicine visit.  I understand that I have the right to withhold or withdraw my consent to the use of telemedicine in the course of my care at any time, without affecting my right to future care or treatment, and that the Practitioner or I may terminate the telemedicine visit at any time. I understand that I have the right to inspect all information obtained and/or  recorded in the course of the telemedicine visit and may receive copies of available information for a reasonable fee.  I understand that some of the potential risks of receiving the Services via telemedicine include:  Marland Kitchen Delay or interruption in medical evaluation due to technological equipment failure or disruption; . Information transmitted may not be sufficient (e.g. poor resolution of images) to allow for appropriate medical decision making by the Practitioner; and/or  . In rare instances, security protocols could fail, causing a breach of personal health information.  Furthermore, I acknowledge that it is my responsibility to provide information about my medical history, conditions and care that is complete and accurate to the best of my ability. I acknowledge that Practitioner's advice, recommendations, and/or decision may be based on factors not within their control, such as incomplete or inaccurate data provided by me or distortions of diagnostic images or specimens that may result from electronic transmissions. I understand that the practice of medicine is not an exact science and that Practitioner makes no warranties or guarantees regarding treatment outcomes. I acknowledge that I will receive a copy of this consent concurrently upon execution via email to the email address I last provided but may also request a printed copy by calling the office of [facility].    I understand that my insurance will be billed for this visit.   I have read or had this consent read to me. . I understand the contents of this consent, which adequately explains the benefits and risks of the Services being provided via telemedicine.  . I have been provided ample opportunity to ask questions regarding this consent and the Services and have had my questions answered to my satisfaction. . I give  my informed consent for the services to be provided through the use of telemedicine in my medical care  By participating  in this telemedicine visit I agree to the above.

## 2019-02-24 ENCOUNTER — Other Ambulatory Visit: Payer: Self-pay | Admitting: Nurse Practitioner

## 2019-02-24 ENCOUNTER — Other Ambulatory Visit: Payer: Self-pay

## 2019-02-24 ENCOUNTER — Ambulatory Visit
Admission: RE | Admit: 2019-02-24 | Discharge: 2019-02-24 | Disposition: A | Discharge status: Home / Self Care | Payer: Medicare Other | Source: Ambulatory Visit | Attending: Nurse Practitioner | Admitting: Nurse Practitioner

## 2019-02-24 DIAGNOSIS — R911 Solitary pulmonary nodule: Secondary | ICD-10-CM

## 2019-02-25 ENCOUNTER — Other Ambulatory Visit: Payer: Self-pay

## 2019-02-25 ENCOUNTER — Ambulatory Visit (INDEPENDENT_AMBULATORY_CARE_PROVIDER_SITE_OTHER): Payer: Medicare Other | Admitting: Nurse Practitioner

## 2019-02-25 ENCOUNTER — Other Ambulatory Visit: Payer: Self-pay | Admitting: Nurse Practitioner

## 2019-02-25 ENCOUNTER — Encounter: Payer: Self-pay | Admitting: Nurse Practitioner

## 2019-02-25 DIAGNOSIS — R911 Solitary pulmonary nodule: Secondary | ICD-10-CM | POA: Diagnosis not present

## 2019-02-25 DIAGNOSIS — J90 Pleural effusion, not elsewhere classified: Secondary | ICD-10-CM

## 2019-02-25 NOTE — Progress Notes (Signed)
This service is provided via telemedicine  No vital signs collected/recorded due to the encounter was a telemedicine visit.   Location of patient (ex: home, work):  Home  Patient consents to a telephone visit: Yes  Location of the provider (ex: office, home): [facility], Office   Name of any referring provider: N/A  Names of all persons participating in the telemedicine service and their role in the encounter:  S.Chrae B/CMA, Sherrie Mustache, NP, Mrs.Mcgowen, and Patient   Time spent on call:  7 min with medical assistant      Careteam: Patient Care Team: [name], NP as PCP - General (Geriatric Medicine) [name], MD as Consulting Physician (Nephrology)  Advanced Directive information    Allergies  Allergen Reactions  . Aleve [Naproxen Sodium] Other (See Comments)    Upset stomach   . Antihistamines, Diphenhydramine-Type Other (See Comments)    Causes urinary issues  . Dimenhydrinate Other (See Comments)    Causes urinary issues Causes urinary issues Causes urinary issues Causes urinary issues  . Nsaids     upset stomach  . Tolmetin Other (See Comments)    Bother his stomach Bother his stomach    Chief Complaint  Patient presents with  . Follow-up    Discuss CT results. Telephone visit      HPI: Patient is a 85 y.o. male to discuss CT results.  In March 2020 on chest xray there was 11 mm appears mildly enlarged compared with previous studies from 02/26/2018 and 03/12/2018. CT results reveal Moderate centrilobular emphysema. There is a 1.1 cm nodule of the left upper lobe. Small to moderate right pleural effusion with associated atelectasis or consolidation, similar in appearance to prior radiographs.  Pt with chronic cough and congestion that comes and goes. No worsening of symptoms at this time.    Review of Systems:  Review of Systems  Constitutional: Negative for chills, fever and malaise/fatigue.  Respiratory:  Positive for cough. Negative for sputum production, shortness of breath and wheezing.   Cardiovascular: Negative for chest pain, palpitations, orthopnea and leg swelling.  Gastrointestinal: Negative for abdominal pain.  Genitourinary: Positive for frequency. Negative for dysuria and urgency.  Musculoskeletal: Negative for joint pain and myalgias.  Neurological: Positive for tremors and weakness (without change). Negative for dizziness and headaches.  Psychiatric/Behavioral: Negative for depression. The patient is not nervous/anxious and does not have insomnia.     Past Medical History:  Diagnosis Date  . Abnormality of gait   . Benign neoplasm of colon   . Benign prostate hyperplasia   . Chronic kidney disease, stage IV (severe) ([place])   . Closed fracture of ramus of right pubis ([place])   . Complication of anesthesia    postop headache after general anesthesia  . Degenerative arthritis   . Diverticulosis of colon (without mention of hemorrhage)   . Duodenal ulcer, unspecified as acute or chronic, without hemorrhage, perforation, or obstruction   . Encounter for long-term (current) use of other medications   . Enlarged prostate    takes Tamsulosin daily  . Essential and other specified forms of tremor   . Foley catheter in place 09-01-13   4 months now  . Foot drop, bilateral   . Gait disorder    balance issues-uses walker or mobile chair.  . Gout    takes Allopurinol daily  . Gout, unspecified   . Hyperlipidemia    but doesn't require meds  . Hypertension    takes Enalapril  daily   . Inguinal hernia without mention of obstruction or gangrene, unilateral or unspecified, (not specified as recurrent)   . Internal hemorrhoids without mention of complication   . Macular degeneration    takes eye cap vits daily  . Other abnormal blood chemistry   . PAD (peripheral artery disease) ([place])   . Peripheral neuropathy   . Peripheral neuropathy   . Pneumonia    hx of;last time 8'14-mild.   . Ptosis    Left side  . Spinal stenosis, lumbar region, with neurogenic claudication   . Syncope   . TIA (transient ischemic attack)    sees Dr.Willis-neurologist 6'14(strange feeling of head)-no residual  . Unspecified vitamin D deficiency   . Urinary frequency   . Urinary urgency   . UTI (lower urinary tract infection) 09-01-13   tx. 2 weeks ago   Past Surgical History:  Procedure Laterality Date  . CATARACT EXTRACTION Right 08/31/2014   Dr.Lyles  . colonosocpy    . FINGER SURGERY Left    Left ring finger  . HERNIA REPAIR     at least 32yrs ago  . HIP PINNING,CANNULATED Left 05/05/2013   Procedure: CANNULATED HIP PINNING- left;  Surgeon: Renette Butters, MD;  Location: [facility];  Service: Orthopedics;  Laterality: Left;  . INGUINAL HERNIA REPAIR  09/05/2012   Procedure: LAPAROSCOPIC INGUINAL HERNIA;  Surgeon: Gayland Curry, MD,FACS;  Location: [facility];  Service: General;  Laterality: Left;  . INSERTION OF MESH  09/05/2012   Procedure: INSERTION OF MESH;  Surgeon: Gayland Curry, MD,FACS;  Location: [facility];  Service: General;  Laterality: Left;  . left leg surgery  2011  . NERVE BIOPSY    . TRANSURETHRAL RESECTION OF PROSTATE N/A 09/07/2013   Procedure: CYSTO TRANSURETHRAL RESECTION OF THE PROSTATE (TURP);  Surgeon: Reece Packer, MD;  Location: [facility];  Service: Urology;  Laterality: N/A;   Social History:   reports that he quit smoking about 33 years ago. His smoking use included cigarettes. He has a 40.00 pack-year smoking history. He quit smokeless tobacco use about 18 years ago.  His smokeless tobacco use included chew. He reports that he does not drink alcohol or use drugs.  Family History  Problem Relation Age of Onset  . Diabetes Mother        type 2  . Clotting disorder Mother        deceased, blood clot  . Leukemia Father   . COPD Brother   . Lung cancer Sister   . Heart disease Brother        1/2 brother  . COPD Brother     Medications: Patient's Medications   New Prescriptions   No medications on file  Previous Medications   ACETAMINOPHEN (TYLENOL) 500 MG TABLET    Take 500 mg by mouth at bedtime.    ALLOPURINOL (ZYLOPRIM) 100 MG TABLET    Take 2 tablets by mouth once daily   B COMPLEX VITAMINS CAPSULE    Take 1 capsule by mouth daily.    CHOLECALCIFEROL (VITAMIN D3) 2000 UNITS TABS    Take 2,000 Units by mouth daily.    CLOPIDOGREL (PLAVIX) 75 MG TABLET    TAKE 1 TABLET BY MOUTH ONCE DAILY FOR ANTICOAGULATION.   FINASTERIDE (PROSCAR) 5 MG TABLET    TAKE 1 TABLET BY MOUTH ONCE DAILY TO HELP SHRINK PROSTATE.   FUROSEMIDE (LASIX) 20 MG TABLET    Take 20 mg by mouth as needed for edema. Ok  to increase to 40 mg if swelling presents per kidney specialist   METOPROLOL TARTRATE (LOPRESSOR) 25 MG TABLET    Take 1 tablet (25 mg total) by mouth 2 (two) times daily.   MULTIPLE VITAMINS-MINERALS (PRESERVISION AREDS 2 PO)    Take 1 tablet by mouth daily.   PRIMIDONE (MYSOLINE) 50 MG TABLET    TAKE 1 TABLET BY MOUTH ONCE DAILY IN THE EVENING TO HELP WITH TREMORS.   TETRAHYDROZOLINE HCL (VISINE OP)    Apply to eye. As needed for dry eye  Modified Medications   No medications on file  Discontinued Medications   No medications on file     Physical Exam: unable due to tele-visit.   Labs reviewed: Basic Metabolic Panel: Recent Labs    03/28/18 0628  07/16/18 1115 10/22/18 1512 12/10/18 0945  NA 140   < > 135 137 138  K 4.6   < > 5.1 5.1 4.9  CL 107   < > 103 102 104  CO2 26   < > 24 24 25   GLUCOSE 101*   < > 91 74 65  BUN 44*   < > 52* 51* 61*  CREATININE 2.14*   < > 2.16* 2.23* 2.54*  CALCIUM 8.9   < > 9.1 9.0 9.2  TSH 2.130  --   --   --   --    < > = values in this interval not displayed.   Liver Function Tests: Recent Labs    02/26/18 1215 07/16/18 1115  AST 24 31  ALT 30 37  BILITOT 0.5 0.3  PROT 6.4 6.5   No results for input(s): LIPASE, AMYLASE in the last 8760 hours. No results for input(s): AMMONIA in the last 8760 hours.  CBC: Recent Labs    03/28/18 0628 07/16/18 1115 12/10/18 0945  WBC 6.7 7.1 5.4  NEUTROABS  --  4,303 3,213  HGB 12.8* 12.8* 12.9*  HCT 40.7 38.0* 38.6  MCV 103.3* 94.8 96.3  PLT 112* 166 120*   Lipid Panel: No results for input(s): CHOL, HDL, LDLCALC, TRIG, CHOLHDL, LDLDIRECT in the last 8760 hours. TSH: Recent Labs    03/28/18 0628  TSH 2.130   A1C: No results found for: HGBA1C   Assessment/Plan 1. Solitary pulmonary nodule Per report nodules suspicious for primary lung malignancy or metastatic disease given history of melanoma. Consider PET-CT to evaluate for metabolic activity. This nodule may additionally be amenable to percutaneous CT-guided biopsy. However patient does not wish for any further work- up given his age and medial history.   2. Pleural effusion, right Small to moderate right pleural effusion with associated atelectasis or consolidation, similar in appearance to prior radiographs. Pt without worsening shortness of breath or cough.   Next appt: 04/23/2019 Carlos American. Harle Battiest  [facility] [phone]    Virtual Visit via Telephone Note  I connected with@ on 02/25/19 at 11:30 AM EDT by telephone and verified that I am speaking with the correct person using two identifiers.  Location: Patient: home Provider: office   I discussed the limitations, risks, security and privacy concerns of performing an evaluation and management service by telephone and the availability of in person appointments. I also discussed with the patient that there may be a patient responsible charge related to this service. The patient expressed understanding and agreed to proceed.   I discussed the assessment and treatment plan with the patient. The patient was provided an opportunity to ask questions and  all were answered. The patient agreed with the plan and demonstrated an understanding of the instructions.   The patient was advised to call  back or seek an in-person evaluation if the symptoms worsen or if the condition fails to improve as anticipated.  I provided 16 minutes of non-face-to-face time during this encounter.  Carlos American. Harle Battiest Avs printed and mailed

## 2019-02-26 ENCOUNTER — Ambulatory Visit: Payer: Medicare Other | Admitting: Interventional Cardiology

## 2019-03-02 ENCOUNTER — Telehealth: Payer: Self-pay | Admitting: *Deleted

## 2019-03-02 NOTE — Telephone Encounter (Signed)
Wife notified and stated that she will continue to watch them and if any new symptoms occur or if they increase in size or anymore appear she will call us back and schedule an appointment.

## 2019-03-02 NOTE — Telephone Encounter (Signed)
We discussed having them come in to have it looked at if they worsen, she can make an in office visit if she would like

## 2019-03-02 NOTE — Telephone Encounter (Signed)
Patient wife, Inez Catalina called and stated that patient has nodules coming up. Has one on Right shoulder, Above Left Breast, Left Axilla. Not bruises. Not colored, Raised up, Tender does not hurt. Itches. Started about 7-10 days ago. No fever. No other noted symptoms except a cough that he has had for over a year now.  Wife is wondering if this should be biopsied. Please Advise.

## 2019-03-03 DIAGNOSIS — R911 Solitary pulmonary nodule: Secondary | ICD-10-CM | POA: Insufficient documentation

## 2019-03-03 DIAGNOSIS — I272 Pulmonary hypertension, unspecified: Secondary | ICD-10-CM | POA: Insufficient documentation

## 2019-03-03 NOTE — Progress Notes (Signed)
Virtual Visit via Telephone Note   This visit type was conducted due to national recommendations for restrictions regarding the COVID-19 Pandemic (e.g. social distancing) in an effort to limit this patient's exposure and mitigate transmission in our community.  Due to his co-morbid illnesses, this patient is at least at moderate risk for complications without adequate follow up.  This format is felt to be most appropriate for this patient at this time.  The patient did not have access to video technology/had technical difficulties with video requiring transitioning to audio format only (telephone).  All issues noted in this document were discussed and addressed.  No physical exam could be performed with this format.  Please refer to the patient's chart for his  consent to telehealth for [facility].   Date:  03/04/2019   ID:  Drew Gibson, DOB August 05, 1929, MRN [id number]  Patient Location: Home Provider Location: Home  PCP:  [name], NP  Cardiologist:  Larae Grooms, MD previous Dr. Saunders Revel Electrophysiologist:  None   Evaluation Performed:  Follow-Up Visit  Chief Complaint:  F/U  History of Present Illness:    Drew Gibson is a 85 y.o. male with history of hypertension, bilateral renal artery stenosis-60%-no plans for intervention givne age,fraility worsening renal function, , HLD, CKD stage IV, PVD, TIA and history of syncope, chronic diastolic CHF, moderate pulmonary hypertension on echo, recurrent falls with multiple fractures.  Last saw Dr. Irish Lack 11/2018.  Lasix has been increased to 20 mg daily for elevated BNP creatinine was staying about 2.5 chronic shortness of breath felt to be multifactorial including deconditioning from his injuries secondary to falls, small pleural effusions on x-ray, pulmonary hypertension.recently found to have pulmonary nodule suspicious for CA but patient declined further w/u or intervention.   Patient gets short of breath with little  activity like walking to the bathroom. Swelling went down with the increase in lasix. He takes it about 5 times a week not daily. Took lasix 20 mg this am for swelling sometimes takes 40 mg per renal. Very weak. Wife says he's developed 4 small firm nodules on chest and shoulder that are marble sized.Pharmacy has reviewed his meds and doesn't think meds are causing it. PCP is aware but hasn't seen him yet. Wife is a retired Marine scientist and takes good care of him. She does say he has a rattling cough and not sure if it's his lung problems or not. Has an appt with PCP next week.  The patient does not have symptoms concerning for COVID-19 infection (fever, chills, cough, or new shortness of breath).    Past Medical History:  Diagnosis Date  . Abnormality of gait   . Benign neoplasm of colon   . Benign prostate hyperplasia   . Chronic kidney disease, stage IV (severe) ([place])   . Closed fracture of ramus of right pubis ([place])   . Complication of anesthesia    postop headache after general anesthesia  . Degenerative arthritis   . Diverticulosis of colon (without mention of hemorrhage)   . Duodenal ulcer, unspecified as acute or chronic, without hemorrhage, perforation, or obstruction   . Encounter for long-term (current) use of other medications   . Enlarged prostate    takes Tamsulosin daily  . Essential and other specified forms of tremor   . Foley catheter in place 09-01-13   4 months now  . Foot drop, bilateral   . Gait disorder    balance issues-uses walker or mobile chair.  Marland Kitchen  Gout    takes Allopurinol daily  . Gout, unspecified   . Hyperlipidemia    but doesn't require meds  . Hypertension    takes Enalapril daily   . Inguinal hernia without mention of obstruction or gangrene, unilateral or unspecified, (not specified as recurrent)   . Internal hemorrhoids without mention of complication   . Macular degeneration    takes eye cap vits daily  . Other abnormal blood chemistry   . PAD  (peripheral artery disease) ([place])   . Peripheral neuropathy   . Peripheral neuropathy   . Pneumonia    hx of;last time 8'14-mild.  . Ptosis    Left side  . Spinal stenosis, lumbar region, with neurogenic claudication   . Syncope   . TIA (transient ischemic attack)    sees Dr.Willis-neurologist 6'14(strange feeling of head)-no residual  . Unspecified vitamin D deficiency   . Urinary frequency   . Urinary urgency   . UTI (lower urinary tract infection) 09-01-13   tx. 2 weeks ago   Past Surgical History:  Procedure Laterality Date  . CATARACT EXTRACTION Right 08/31/2014   Dr.Lyles  . colonosocpy    . FINGER SURGERY Left    Left ring finger  . HERNIA REPAIR     at least 11yrs ago  . HIP PINNING,CANNULATED Left 05/05/2013   Procedure: CANNULATED HIP PINNING- left;  Surgeon: Renette Butters, MD;  Location: [facility];  Service: Orthopedics;  Laterality: Left;  . INGUINAL HERNIA REPAIR  09/05/2012   Procedure: LAPAROSCOPIC INGUINAL HERNIA;  Surgeon: Gayland Curry, MD,FACS;  Location: [facility];  Service: General;  Laterality: Left;  . INSERTION OF MESH  09/05/2012   Procedure: INSERTION OF MESH;  Surgeon: Gayland Curry, MD,FACS;  Location: [facility];  Service: General;  Laterality: Left;  . left leg surgery  2011  . NERVE BIOPSY    . TRANSURETHRAL RESECTION OF PROSTATE N/A 09/07/2013   Procedure: CYSTO TRANSURETHRAL RESECTION OF THE PROSTATE (TURP);  Surgeon: Reece Packer, MD;  Location: [facility];  Service: Urology;  Laterality: N/A;     Current Meds  Medication Sig  . acetaminophen (TYLENOL) 500 MG tablet Take 500 mg by mouth at bedtime.   Marland Kitchen allopurinol (ZYLOPRIM) 100 MG tablet Take 2 tablets by mouth once daily  . b complex vitamins capsule Take 1 capsule by mouth daily.   . Cholecalciferol (VITAMIN D3) 2000 UNITS TABS Take 2,000 Units by mouth daily.   . clopidogrel (PLAVIX) 75 MG tablet TAKE 1 TABLET BY MOUTH ONCE DAILY FOR ANTICOAGULATION.  . finasteride (PROSCAR) 5 MG tablet TAKE 1  TABLET BY MOUTH ONCE DAILY TO HELP SHRINK PROSTATE.  . furosemide (LASIX) 20 MG tablet Take 20 mg by mouth as needed for edema. Ok to increase to 40 mg if swelling presents per kidney specialist  . metoprolol tartrate (LOPRESSOR) 25 MG tablet Take 1 tablet (25 mg total) by mouth 2 (two) times daily.  . Multiple Vitamins-Minerals (PRESERVISION AREDS 2 PO) Take 1 tablet by mouth daily.  . primidone (MYSOLINE) 50 MG tablet TAKE 1 TABLET BY MOUTH ONCE DAILY IN THE EVENING TO HELP WITH TREMORS.  Marland Kitchen Tetrahydrozoline HCl (VISINE OP) Apply to eye. As needed for dry eye     Allergies:   Aleve [naproxen sodium]; Antihistamines, diphenhydramine-type; Dimenhydrinate; Nsaids; and Tolmetin   Social History   Tobacco Use  . Smoking status: Former Smoker    Packs/day: 1.00    Years: 40.00    Pack years: 40.00  Types: Cigarettes    Last attempt to quit: 05/10/1985    Years since quitting: 33.8  . Smokeless tobacco: Former Systems developer    Types: Chew    Quit date: 02/07/2001  . Tobacco comment: quit 30 years ago  Substance Use Topics  . Alcohol use: No    Alcohol/week: 0.0 standard drinks  . Drug use: No     Family Hx: The patient's family history includes COPD in his brother and brother; Clotting disorder in his mother; Diabetes in his mother; Heart disease in his brother; Leukemia in his father; Lung cancer in his sister.  ROS:   Please see the history of present illness.      All other systems reviewed and are negative.   Prior CV studies:   The following studies were reviewed today:  2D echo 03/28/2018 Study Conclusions   - Left ventricle: The cavity size was normal. Wall thickness was   increased in a pattern of moderate LVH. Systolic function was   normal. The estimated ejection fraction was in the range of 55%   to 60%. Wall motion was normal; there were no regional wall   motion abnormalities. Features are consistent with a pseudonormal   left ventricular filling pattern, with  concomitant abnormal   relaxation and increased filling pressure (grade 2 diastolic   dysfunction). - Left atrium: The atrium was severely dilated. - Pulmonary arteries: Systolic pressure was moderately increased.   PA peak pressure: 60 mm Hg (S).   Chest CT 5/26/202039), in keeping with prior radiographs and suspicious for primary lung malignancy or metastatic disease given history of melanoma. Consider PET-CT to evaluate for metabolic activity. This nodule may additionally be amenable to percutaneous CT-guided biopsy.   2. Small to moderate right pleural effusion with associated atelectasis or consolidation, similar in appearance to prior radiographs.   3.  Emphysema.   4. Coronary artery disease, aortic valve calcifications, and cardiomegaly.     Electronically Signed   By: Eddie Candle M.D.   On: 02/24/2019 15:07   Renal ultrasoundFINAL INTERPRETATION: Renal:   Right: Cyst(s) noted. Evidence of a greater than 60% stenosis of the        right renal artery.        Normal size right kidney. Abnormal cortical thickness of        right kidney. Abnormal right Resistive Index. RRV flow        present.        Polycystic kidney with the largest measuring 4.8 cm x 7.2 cm. Left:  Abnormal left Resisitve Index. Normal size of left kidney.        Evidence of a > 60% stenosis in the left renal artery. Normal        cortical thickness of the left kidney. Cyst(s) noted. LRV        flow present.        Polycystic kidney with the largest measuring 2.1 cm x 2.6 cm. Mesenteric: Normal Celiac artery and Superior Mesenteric artery findings.   *See table(s) above for measurements and observations.   Vascular consult recommended. Diagnosing physician: Ida Rogue MD   Electronically signed by Ida Rogue MD on 06/11/2018 at 9:38:09 PM.  Labs/Other Tests and Data Reviewed:    EKG:  An ECG dated 06/27/2018 was personally reviewed today and demonstrated:  Normal sinus rhythm with  first-degree AV block  Recent Labs: 03/28/2018: TSH 2.130 07/16/2018: ALT 37 12/10/2018: Brain Natriuretic Peptide 1,709; BUN 61; Creat 2.54; Hemoglobin 12.9; Platelets 120;  Potassium 4.9; Sodium 138   Recent Lipid Panel Lab Results  Component Value Date/Time   CHOL 279 (H) 12/03/2016 09:08 AM   CHOL 286 (H) 09/27/2015 09:44 AM   TRIG 109 12/03/2016 09:08 AM   HDL 62 12/03/2016 09:08 AM   HDL 55 09/27/2015 09:44 AM   CHOLHDL 4.5 12/03/2016 09:08 AM   LDLCALC 195 (H) 12/03/2016 09:08 AM   LDLCALC 207 (H) 09/27/2015 09:44 AM    Wt Readings from Last 3 Encounters:  03/04/19 168 lb (76.2 kg)  12/11/18 184 lb 12.8 oz (83.8 kg)  12/10/18 187 lb (84.8 kg)     Objective:    Vital Signs:  BP 120/71   Pulse 66   Ht 5\' 10"  (1.778 m)   Wt 168 lb (76.2 kg)   BMI 24.11 kg/m    VITAL SIGNS:  reviewed  ASSESSMENT & PLAN:    1. Essential hypertension BP well controlled. 2. Chronic diastolic CHF takes lasix prn per renal and needing to use about 5 days/week. Short of breath with little activity. Doesn't think he needs Oxygen. Patient's wife says they can have landmark check on him weekly. 3. Bilateral renal artery stenosis about 60% bilaterally on duplex 06/2018 blood pressure has been controlled so no plans for intervention 4. Pulmonary hypertension 5. CKD stage IV creatinine 2.54 12/10/2018-followed closely by Dr. Arty Baumgartner. 6. Recurrent falls 7. Solitary pulmonary nodule suspicious for primary lung CA versus metastatic disease with history of melanoma.  PET scan was offered but patient declined further work-up given his age and medical history. 8. History of CVA on Plavix per Dr. Jannifer Franklin  COVID-19 Education: The signs and symptoms of COVID-19 were discussed with the patient and how to seek care for testing (follow up with PCP or arrange E-visit). The importance of social distancing was discussed today.  Time:   Today, I have spent 22:30  minutes with the patient with telehealth  technology discussing the above problems.     Medication Adjustments/Labs and Tests Ordered: Current medicines are reviewed at length with the patient today.  Concerns regarding medicines are outlined above.   Tests Ordered: No orders of the defined types were placed in this encounter.   Medication Changes: No orders of the defined types were placed in this encounter.   Disposition:  Follow up in 3 month(s) Dr. Irish Lack  Signed, Ermalinda Barrios, PA-C  03/04/2019 11:27 AM    [place]

## 2019-03-04 ENCOUNTER — Telehealth (INDEPENDENT_AMBULATORY_CARE_PROVIDER_SITE_OTHER): Payer: Medicare Other | Admitting: Physician Assistant

## 2019-03-04 ENCOUNTER — Encounter: Payer: Self-pay | Admitting: Physician Assistant

## 2019-03-04 ENCOUNTER — Other Ambulatory Visit: Payer: Self-pay

## 2019-03-04 VITALS — BP 120/71 | HR 66 | Ht 70.0 in | Wt 168.0 lb

## 2019-03-04 DIAGNOSIS — Z9181 History of falling: Secondary | ICD-10-CM

## 2019-03-04 DIAGNOSIS — I5032 Chronic diastolic (congestive) heart failure: Secondary | ICD-10-CM

## 2019-03-04 DIAGNOSIS — I10 Essential (primary) hypertension: Secondary | ICD-10-CM | POA: Diagnosis not present

## 2019-03-04 DIAGNOSIS — N184 Chronic kidney disease, stage 4 (severe): Secondary | ICD-10-CM

## 2019-03-04 DIAGNOSIS — I272 Pulmonary hypertension, unspecified: Secondary | ICD-10-CM

## 2019-03-04 DIAGNOSIS — I701 Atherosclerosis of renal artery: Secondary | ICD-10-CM

## 2019-03-04 DIAGNOSIS — R911 Solitary pulmonary nodule: Secondary | ICD-10-CM

## 2019-03-04 DIAGNOSIS — Z8673 Personal history of transient ischemic attack (TIA), and cerebral infarction without residual deficits: Secondary | ICD-10-CM

## 2019-03-04 NOTE — Patient Instructions (Addendum)
Medication Instructions:  Your physician recommends that you continue on your current medications as directed. Please refer to the Current Medication list given to you today.  If you need a refill on your cardiac medications before your next appointment, please call your pharmacy.   Lab work: None Ordered  If you have labs (blood work) drawn today and your tests are completely normal, you will receive your results only by: Marland Kitchen MyChart Message (if you have MyChart) OR . A paper copy in the mail If you have any lab test that is abnormal or we need to change your treatment, we will call you to review the results.  Testing/Procedures: None ordered  Follow-Up: . Follow up with Dr. Irish Lack on 06/05/19 at 10:40 AM  Any Other Special Instructions Will Be Listed Below (If Applicable).

## 2019-03-11 ENCOUNTER — Other Ambulatory Visit: Payer: Self-pay

## 2019-03-11 ENCOUNTER — Encounter: Payer: Self-pay | Admitting: Nurse Practitioner

## 2019-03-11 ENCOUNTER — Ambulatory Visit (INDEPENDENT_AMBULATORY_CARE_PROVIDER_SITE_OTHER): Payer: Medicare Other | Admitting: Nurse Practitioner

## 2019-03-11 VITALS — BP 136/80 | HR 75 | Temp 97.9°F | Ht 68.0 in | Wt 177.0 lb

## 2019-03-11 DIAGNOSIS — I5032 Chronic diastolic (congestive) heart failure: Secondary | ICD-10-CM | POA: Diagnosis not present

## 2019-03-11 DIAGNOSIS — N184 Chronic kidney disease, stage 4 (severe): Secondary | ICD-10-CM

## 2019-03-11 DIAGNOSIS — R229 Localized swelling, mass and lump, unspecified: Secondary | ICD-10-CM

## 2019-03-11 DIAGNOSIS — R911 Solitary pulmonary nodule: Secondary | ICD-10-CM

## 2019-03-11 MED ORDER — FUROSEMIDE 20 MG PO TABS: 20.0000 mg | ORAL_TABLET | ORAL | 5 refills | Status: DC | PRN

## 2019-03-11 NOTE — Progress Notes (Signed)
Careteam: Patient Care Team: [name], NP as PCP - General (Geriatric Medicine) Jettie Booze, MD as PCP - Cardiology (Cardiology) [name], MD as Consulting Physician (Nephrology)  Advanced Directive information    Allergies  Allergen Reactions  . Aleve [Naproxen Sodium] Other (See Comments)    Upset stomach   . Antihistamines, Diphenhydramine-Type Other (See Comments)    Causes urinary issues  . Dimenhydrinate Other (See Comments)    Causes urinary issues Causes urinary issues Causes urinary issues Causes urinary issues  . Nsaids     upset stomach  . Tolmetin Other (See Comments)    Bother his stomach Bother his stomach    Chief Complaint  Patient presents with  . Acute Visit    Raised areas of concerns in various areas, areas are tender. Patient denies pain in areas. Patient also c/o SOB and weakness. Patient with ongoing cough x 1 year and wife thinks it is getting worse      HPI: Patient is a 85 y.o. male seen in the office today due to multiple skin nodules that started 3 weeks ago and nodules continues to come up.  Began with nodule to left shoulder that was oblong. No pain but itches and he rubs and scratches area until red and bruised. Several other areas on chest, head and 1 on abdomen. None in mouth or neck.   Ongoing shortness of breath and weakness, did televisit with cardiologist last week and would like to see if nurse to come out to see them. Discussed this with cardiologist.   Pulmonary nodule- wife states 2 of his brothers died from lung cancer. Discussed hospice with him but does not know if it is time for that.   Review of Systems:  Review of Systems  Constitutional: Negative for chills, fever and malaise/fatigue.  Respiratory: Positive for cough. Negative for sputum production, shortness of breath and wheezing.   Cardiovascular: Positive for leg swelling (worse at night. ). Negative for chest pain, palpitations and  orthopnea.  Gastrointestinal: Negative for abdominal pain.  Genitourinary: Positive for frequency. Negative for dysuria and urgency.  Musculoskeletal: Negative for joint pain and myalgias.  Neurological: Positive for tremors and weakness (without change). Negative for dizziness and headaches.  Psychiatric/Behavioral: Negative for depression. The patient is not nervous/anxious and does not have insomnia.     Past Medical History:  Diagnosis Date  . Abnormality of gait   . Benign neoplasm of colon   . Benign prostate hyperplasia   . Chronic kidney disease, stage IV (severe) ([place])   . Closed fracture of ramus of right pubis ([place])   . Complication of anesthesia    postop headache after general anesthesia  . Degenerative arthritis   . Diverticulosis of colon (without mention of hemorrhage)   . Duodenal ulcer, unspecified as acute or chronic, without hemorrhage, perforation, or obstruction   . Encounter for long-term (current) use of other medications   . Enlarged prostate    takes Tamsulosin daily  . Essential and other specified forms of tremor   . Foley catheter in place 09-01-13   4 months now  . Foot drop, bilateral   . Gait disorder    balance issues-uses walker or mobile chair.  . Gout    takes Allopurinol daily  . Gout, unspecified   . Hyperlipidemia    but doesn't require meds  . Hypertension    takes Enalapril daily   . Inguinal hernia without mention of obstruction or gangrene, unilateral  or unspecified, (not specified as recurrent)   . Internal hemorrhoids without mention of complication   . Macular degeneration    takes eye cap vits daily  . Other abnormal blood chemistry   . PAD (peripheral artery disease) ([place])   . Peripheral neuropathy   . Peripheral neuropathy   . Pneumonia    hx of;last time 8'14-mild.  . Ptosis    Left side  . Spinal stenosis, lumbar region, with neurogenic claudication   . Syncope   . TIA (transient ischemic attack)    sees  Dr.Willis-neurologist 6'14(strange feeling of head)-no residual  . Unspecified vitamin D deficiency   . Urinary frequency   . Urinary urgency   . UTI (lower urinary tract infection) 09-01-13   tx. 2 weeks ago   Past Surgical History:  Procedure Laterality Date  . CATARACT EXTRACTION Right 08/31/2014   Dr.Lyles  . colonosocpy    . FINGER SURGERY Left    Left ring finger  . HERNIA REPAIR     at least 79yrs ago  . HIP PINNING,CANNULATED Left 05/05/2013   Procedure: CANNULATED HIP PINNING- left;  Surgeon: Renette Butters, MD;  Location: [facility];  Service: Orthopedics;  Laterality: Left;  . INGUINAL HERNIA REPAIR  09/05/2012   Procedure: LAPAROSCOPIC INGUINAL HERNIA;  Surgeon: Gayland Curry, MD,FACS;  Location: [facility];  Service: General;  Laterality: Left;  . INSERTION OF MESH  09/05/2012   Procedure: INSERTION OF MESH;  Surgeon: Gayland Curry, MD,FACS;  Location: [facility];  Service: General;  Laterality: Left;  . left leg surgery  2011  . NERVE BIOPSY    . TRANSURETHRAL RESECTION OF PROSTATE N/A 09/07/2013   Procedure: CYSTO TRANSURETHRAL RESECTION OF THE PROSTATE (TURP);  Surgeon: Reece Packer, MD;  Location[facility];  Service: Urology;  Laterality: N/A;   Social History:   reports that he quit smoking about 33 years ago. His smoking use included cigarettes. He has a 40.00 pack-year smoking history. He quit smokeless tobacco use about 18 years ago.  His smokeless tobacco use included chew. He reports that he does not drink alcohol or use drugs.  Family History  Problem Relation Age of Onset  . Diabetes Mother        type 2  . Clotting disorder Mother        deceased, blood clot  . Leukemia Father   . COPD Brother   . Lung cancer Sister   . Heart disease Brother        1/2 brother  . COPD Brother     Medications: Patient's Medications  New Prescriptions   No medications on file  Previous Medications   ACETAMINOPHEN (TYLENOL) 500 MG TABLET    Take 500 mg by mouth at bedtime.     ALLOPURINOL (ZYLOPRIM) 100 MG TABLET    Take 2 tablets by mouth once daily   B COMPLEX VITAMINS CAPSULE    Take 1 capsule by mouth daily.    CHOLECALCIFEROL (VITAMIN D3) 2000 UNITS TABS    Take 2,000 Units by mouth daily.    CLOPIDOGREL (PLAVIX) 75 MG TABLET    TAKE 1 TABLET BY MOUTH ONCE DAILY FOR ANTICOAGULATION.   FINASTERIDE (PROSCAR) 5 MG TABLET    TAKE 1 TABLET BY MOUTH ONCE DAILY TO HELP SHRINK PROSTATE.   METOPROLOL TARTRATE (LOPRESSOR) 25 MG TABLET    Take 1 tablet (25 mg total) by mouth 2 (two) times daily.   MULTIPLE VITAMINS-MINERALS (PRESERVISION AREDS 2 PO)  Take 1 tablet by mouth daily.   PRIMIDONE (MYSOLINE) 50 MG TABLET    TAKE 1 TABLET BY MOUTH ONCE DAILY IN THE EVENING TO HELP WITH TREMORS.   TETRAHYDROZOLINE HCL (VISINE OP)    Apply to eye. As needed for dry eye  Modified Medications   Modified Medication Previous Medication   FUROSEMIDE (LASIX) 20 MG TABLET furosemide (LASIX) 20 MG tablet      Take 1 tablet (20 mg total) by mouth as needed for edema. Ok to increase to 40 mg if swelling presents per kidney specialist    Take 20 mg by mouth as needed for edema. Ok to increase to 40 mg if swelling presents per kidney specialist  Discontinued Medications   No medications on file    Physical Exam:  Vitals:   03/11/19 1433  BP: 136/80  Pulse: 75  Temp: 97.9 F (36.6 C)  TempSrc: Oral  SpO2: 97%  Weight: 177 lb (80.3 kg)  Height: 5\' 8"  (1.727 m)   Body mass index is 26.91 kg/m. Wt Readings from Last 3 Encounters:  03/11/19 177 lb (80.3 kg)  03/04/19 168 lb (76.2 kg)  12/11/18 184 lb 12.8 oz (83.8 kg)    Physical Exam Constitutional:      Appearance: Normal appearance.  HENT:     Head: Normocephalic and atraumatic.  Eyes:     Pupils: Pupils are equal, round, and reactive to light.  Neck:     Musculoskeletal: Normal range of motion.  Cardiovascular:     Rate and Rhythm: Normal rate and regular rhythm.  Pulmonary:     Effort: Pulmonary effort is normal.      Comments: Diminished in the bases Abdominal:     General: Abdomen is flat.     Palpations: Abdomen is soft.  Skin:    Comments: Multiple hard fixed nodules throughout Right shoulder with hard oblong fixed nodule. Red and bruised due to rubing/itching. No drainage or infection noted. Other areas raised without discoloration -2 pea size nodules on right chest, 1 pea size nodule to right abdomen, pea size nodule noted to left occipital area, 1 small nodule noted to left cheek.  Neurological:     Mental Status: He is alert.     Labs reviewed: Basic Metabolic Panel: Recent Labs    03/28/18 0628  07/16/18 1115 10/22/18 1512 12/10/18 0945  NA 140   < > 135 137 138  K 4.6   < > 5.1 5.1 4.9  CL 107   < > 103 102 104  CO2 26   < > 24 24 25   GLUCOSE 101*   < > 91 74 65  BUN 44*   < > 52* 51* 61*  CREATININE 2.14*   < > 2.16* 2.23* 2.54*  CALCIUM 8.9   < > 9.1 9.0 9.2  TSH 2.130  --   --   --   --    < > = values in this interval not displayed.   Liver Function Tests: Recent Labs    07/16/18 1115  AST 31  ALT 37  BILITOT 0.3  PROT 6.5   No results for input(s): LIPASE, AMYLASE in the last 8760 hours. No results for input(s): AMMONIA in the last 8760 hours. CBC: Recent Labs    03/28/18 0628 07/16/18 1115 12/10/18 0945  WBC 6.7 7.1 5.4  NEUTROABS  --  4,303 3,213  HGB 12.8* 12.8* 12.9*  HCT 40.7 38.0* 38.6  MCV 103.3* 94.8 96.3  PLT 112* 166  120*   Lipid Panel: No results for input(s): CHOL, HDL, LDLCALC, TRIG, CHOLHDL, LDLDIRECT in the last 8760 hours. TSH: Recent Labs    03/28/18 0628  TSH 2.130   A1C: No results found for: HGBA1C   Assessment/Plan 1. Multiple skin nodules - Ambulatory referral to Dermatology- skin surgery center for further evaluation.            2. Chronic diastolic heart failure (HCC) Stable at this time, continues to use lasix as needed Discussed with wife and she originally wanted home health nursing but due to CKD, CHF and  solitary nodule wife would like palliative care referral this way if/when they have to go to hospice they will already be established with palliative care.  - Amb Referral to Palliative Care  3. CKD (chronic kidney disease) stage 4, GFR 15-29 ml/min (HCC) - Amb Referral to Palliative Care  4. Solitary pulmonary nodule -pt and wife did not want work-up but highly suspicious for malignancy. They have discussed hospice but does not feel like he is ready for that at this time. Agreeable to palliative care for nursing and SW support.  - Amb Referral to Palliative Care    Next appt: 04/23/2019 Carlos American. [place], [facility] [phone]

## 2019-03-16 ENCOUNTER — Telehealth: Payer: Self-pay | Admitting: Internal Medicine

## 2019-03-16 NOTE — Telephone Encounter (Signed)
Talked with patient's wife Inez Catalina and have scheduled a Telephone Palliative consult for 03/20/19 @ 10 AM.

## 2019-03-20 ENCOUNTER — Other Ambulatory Visit: Payer: Self-pay

## 2019-03-20 ENCOUNTER — Telehealth: Payer: Self-pay | Admitting: *Deleted

## 2019-03-20 ENCOUNTER — Other Ambulatory Visit: Payer: Medicare Other | Admitting: Internal Medicine

## 2019-03-20 DIAGNOSIS — Z515 Encounter for palliative care: Secondary | ICD-10-CM

## 2019-03-20 NOTE — Telephone Encounter (Signed)
Drew Lex, NP with Hospice called back and stated that she would like to speak with [name] concerning patient Care. Stated that he is complex. Stated that wife said patient is sleeping more during the day and night. Increase Weakness and now having to use a wheelchair to get to bathroom. New onset of Abdominal pain since Wednesday. No blood, No constipation, No Nausea and No vomiting. His goals is to remain Comfortable. NP thinks he is needing labs done.   Would like to discuss his care with you. She knows you are out of office till next week.  Please Call [phone]

## 2019-03-20 NOTE — Telephone Encounter (Signed)
Gonzella Lex with Hospice called and left message on Clinical Intake stating that she seen patient today and she needs to discuss with Provider a different Plan of Care. Stated that he has new onset of Abdominal pain.   Tried calling Gonzella Lex, NP back. LMOM to return call.

## 2019-03-23 ENCOUNTER — Other Ambulatory Visit: Payer: Medicare Other

## 2019-03-23 ENCOUNTER — Other Ambulatory Visit: Payer: Self-pay

## 2019-03-23 ENCOUNTER — Encounter: Payer: Self-pay | Admitting: Nurse Practitioner

## 2019-03-23 ENCOUNTER — Ambulatory Visit (INDEPENDENT_AMBULATORY_CARE_PROVIDER_SITE_OTHER): Payer: Medicare Other | Admitting: Nurse Practitioner

## 2019-03-23 DIAGNOSIS — I10 Essential (primary) hypertension: Secondary | ICD-10-CM

## 2019-03-23 DIAGNOSIS — N184 Chronic kidney disease, stage 4 (severe): Secondary | ICD-10-CM | POA: Diagnosis not present

## 2019-03-23 DIAGNOSIS — M109 Gout, unspecified: Secondary | ICD-10-CM | POA: Diagnosis not present

## 2019-03-23 DIAGNOSIS — R229 Localized swelling, mass and lump, unspecified: Secondary | ICD-10-CM | POA: Diagnosis not present

## 2019-03-23 DIAGNOSIS — I5032 Chronic diastolic (congestive) heart failure: Secondary | ICD-10-CM | POA: Diagnosis not present

## 2019-03-23 DIAGNOSIS — R911 Solitary pulmonary nodule: Secondary | ICD-10-CM

## 2019-03-23 MED ORDER — METOPROLOL TARTRATE 25 MG PO TABS: 25.0000 mg | ORAL_TABLET | Freq: Two times a day (BID) | ORAL | 1 refills | Status: DC

## 2019-03-23 NOTE — Telephone Encounter (Signed)
Noted, returned call to NP. This is Pharmacist, hospital

## 2019-03-23 NOTE — Progress Notes (Signed)
This service is provided via telemedicine  No vital signs collected/recorded due to the encounter was a telemedicine visit.   Location of Drew Gibson (ex: home, work):  Home   Drew Gibson consents to a telephone visit:  Yes  Location of the provider (ex: office, home):  [facility], Office   Name of any referring provider:  N/A  Names of all persons participating in the telemedicine service and their role in the encounter: S.Chrae B/CMA, Sherrie Mustache, NP, Mrs.Kees, and Drew Gibson   Time spent on call: 6 min with medical assistant      Careteam: Drew Gibson Care Team: [name], NP as PCP - General (Geriatric Medicine) Jettie Booze, MD as PCP - Cardiology (Cardiology) [name], MD as Consulting Physician (Nephrology)  Advanced Directive information    Allergies  Allergen Reactions  . Aleve [Naproxen Sodium] Other (See Comments)    Upset stomach   . Antihistamines, Diphenhydramine-Type Other (See Comments)    Causes urinary issues  . Dimenhydrinate Other (See Comments)    Causes urinary issues Causes urinary issues Causes urinary issues Causes urinary issues  . Nsaids     upset stomach  . Tolmetin Other (See Comments)    Bother his stomach Bother his stomach    Chief Complaint  Drew Gibson presents with  . Follow-up    Follow-up per [facility] coversation. Telephone visit.      HPI: Drew Gibson is a 85 y.o. male for tele-visit.   Reports he has a total of 9 nodule now. He is very tender due to the places they are. One in abdomen where his safety belt goes.  Wife feels like it is due to medication and this is the only reason she wants a biopsy.   Reports increase pain in abdomen and did not feel well 5 days ago, lasted 2 days but now has resolved. States he occasionally has "explosive gas" and feels like there was a flare of that.  Mobility has significantly decline, used to use walker, now he is using a wheelchair. Yesterday  and today have been better. More fatigued. Sleeping more during they. Sleeping at night as well. Gets up at night to urinate.   Ongoing swelling to LE, left off compression hose yesterday.  Using lasix which has been beneficial.     Review of Systems:  Review of Systems  Constitutional: Negative for chills, fever and malaise/fatigue.  Respiratory: Positive for cough. Negative for sputum production, shortness of breath and wheezing.   Cardiovascular: Positive for leg swelling. Negative for chest pain, palpitations and orthopnea.  Gastrointestinal: Negative for abdominal pain.  Genitourinary: Positive for frequency. Negative for dysuria and urgency.  Musculoskeletal: Negative for joint pain and myalgias.  Skin:       Multiple skin nodule.   Neurological: Positive for tremors and weakness. Negative for dizziness and headaches.  Psychiatric/Behavioral: Negative for depression. The Drew Gibson is not nervous/anxious and does not have insomnia.     Past Medical History:  Diagnosis Date  . Abnormality of gait   . Benign neoplasm of colon   . Benign prostate hyperplasia   . Chronic kidney disease, stage IV (severe) ([place])   . Closed fracture of ramus of right pubis ([place])   . Complication of anesthesia    postop headache after general anesthesia  . Degenerative arthritis   . Diverticulosis of colon (without mention of hemorrhage)   . Duodenal ulcer, unspecified as acute or chronic, without hemorrhage, perforation, or obstruction   .  Encounter for long-term (current) use of other medications   . Enlarged prostate    takes Tamsulosin daily  . Essential and other specified forms of tremor   . Foley catheter in place 09-01-13   4 months now  . Foot drop, bilateral   . Gait disorder    balance issues-uses walker or mobile chair.  . Gout    takes Allopurinol daily  . Gout, unspecified   . Hyperlipidemia    but doesn't require meds  . Hypertension    takes Enalapril daily   . Inguinal  hernia without mention of obstruction or gangrene, unilateral or unspecified, (not specified as recurrent)   . Internal hemorrhoids without mention of complication   . Macular degeneration    takes eye cap vits daily  . Other abnormal blood chemistry   . PAD (peripheral artery disease) ([place])   . Peripheral neuropathy   . Peripheral neuropathy   . Pneumonia    hx of;last time 8'14-mild.  . Ptosis    Left side  . Spinal stenosis, lumbar region, with neurogenic claudication   . Syncope   . TIA (transient ischemic attack)    sees Dr.Willis-neurologist 6'14(strange feeling of head)-no residual  . Unspecified vitamin D deficiency   . Urinary frequency   . Urinary urgency   . UTI (lower urinary tract infection) 09-01-13   tx. 2 weeks ago   Past Surgical History:  Procedure Laterality Date  . CATARACT EXTRACTION Right 08/31/2014   Dr.Lyles  . colonosocpy    . FINGER SURGERY Left    Left ring finger  . HERNIA REPAIR     at least 7yrs ago  . HIP PINNING,CANNULATED Left 05/05/2013   Procedure: CANNULATED HIP PINNING- left;  Surgeon: Renette Butters, MD;  Location: [place];  Service: Orthopedics;  Laterality: Left;  . INGUINAL HERNIA REPAIR  09/05/2012   Procedure: LAPAROSCOPIC INGUINAL HERNIA;  Surgeon: Gayland Curry, MD,FACS;  Location: [place];  Service: General;  Laterality: Left;  . INSERTION OF MESH  09/05/2012   Procedure: INSERTION OF MESH;  Surgeon: Gayland Curry, MD,FACS;  Location: [place];  Service: General;  Laterality: Left;  . left leg surgery  2011  . NERVE BIOPSY    . TRANSURETHRAL RESECTION OF PROSTATE N/A 09/07/2013   Procedure: CYSTO TRANSURETHRAL RESECTION OF THE PROSTATE (TURP);  Surgeon: Reece Packer, MD;  Location: [facility];  Service: Urology;  Laterality: N/A;   Social History:   reports that he quit smoking about 33 years ago. His smoking use included cigarettes. He has a 40.00 pack-year smoking history. He quit smokeless tobacco use about 18 years ago.  His  smokeless tobacco use included chew. He reports that he does not drink alcohol or use drugs.  Family History  Problem Relation Age of Onset  . Diabetes Mother        type 2  . Clotting disorder Mother        deceased, blood clot  . Leukemia Father   . COPD Brother   . Lung cancer Sister   . Heart disease Brother        1/2 brother  . COPD Brother     Medications: Drew Gibson's Medications  New Prescriptions   No medications on file  Previous Medications   ACETAMINOPHEN (TYLENOL) 500 MG TABLET    Take 500 mg by mouth at bedtime.    ALLOPURINOL (ZYLOPRIM) 100 MG TABLET    Take 2 tablets by mouth once daily  B COMPLEX VITAMINS CAPSULE    Take 1 capsule by mouth daily.    CHOLECALCIFEROL (VITAMIN D3) 2000 UNITS TABS    Take 2,000 Units by mouth daily.    CLOPIDOGREL (PLAVIX) 75 MG TABLET    TAKE 1 TABLET BY MOUTH ONCE DAILY FOR ANTICOAGULATION.   FINASTERIDE (PROSCAR) 5 MG TABLET    TAKE 1 TABLET BY MOUTH ONCE DAILY TO HELP SHRINK PROSTATE.   FUROSEMIDE (LASIX) 20 MG TABLET    Take 1 tablet (20 mg total) by mouth as needed for edema. Ok to increase to 40 mg if swelling presents per kidney specialist   METOPROLOL TARTRATE (LOPRESSOR) 25 MG TABLET    Take 1 tablet (25 mg total) by mouth 2 (two) times daily.   MULTIPLE VITAMINS-MINERALS (PRESERVISION AREDS 2 PO)    Take 1 tablet by mouth daily.   PRIMIDONE (MYSOLINE) 50 MG TABLET    TAKE 1 TABLET BY MOUTH ONCE DAILY IN THE EVENING TO HELP WITH TREMORS.   TETRAHYDROZOLINE HCL (VISINE OP)    Apply to eye. As needed for dry eye  Modified Medications   No medications on file  Discontinued Medications   No medications on file    Physical Exam:  There were no vitals filed for this visit. There is no height or weight on file to calculate BMI. Wt Readings from Last 3 Encounters:  03/11/19 177 lb (80.3 kg)  03/04/19 168 lb (76.2 kg)  12/11/18 184 lb 12.8 oz (83.8 kg)     Labs reviewed: Basic Metabolic Panel: Recent Labs    03/28/18  0628  07/16/18 1115 10/22/18 1512 12/10/18 0945  NA 140   < > 135 137 138  K 4.6   < > 5.1 5.1 4.9  CL 107   < > 103 102 104  CO2 26   < > 24 24 25   GLUCOSE 101*   < > 91 74 65  BUN 44*   < > 52* 51* 61*  CREATININE 2.14*   < > 2.16* 2.23* 2.54*  CALCIUM 8.9   < > 9.1 9.0 9.2  TSH 2.130  --   --   --   --    < > = values in this interval not displayed.   Liver Function Tests: Recent Labs    07/16/18 1115  AST 31  ALT 37  BILITOT 0.3  PROT 6.5   No results for input(s): LIPASE, AMYLASE in the last 8760 hours. No results for input(s): AMMONIA in the last 8760 hours. CBC: Recent Labs    03/28/18 0628 07/16/18 1115 12/10/18 0945  WBC 6.7 7.1 5.4  NEUTROABS  --  4,303 3,213  HGB 12.8* 12.8* 12.9*  HCT 40.7 38.0* 38.6  MCV 103.3* 94.8 96.3  PLT 112* 166 120*   Lipid Panel: No results for input(s): CHOL, HDL, LDLCALC, TRIG, CHOLHDL, LDLDIRECT in the last 8760 hours. TSH: Recent Labs    03/28/18 0628  TSH 2.130   A1C: No results found for: HGBA1C   Assessment/Plan 1. Essential hypertension - metoprolol tartrate (LOPRESSOR) 25 MG tablet; Take 1 tablet (25 mg total) by mouth 2 (two) times daily.  Dispense: 180 tablet; Refill: 1 - COMPLETE METABOLIC PANEL WITH GFR; Future - CBC with Differential/Platelet; Future  2. Chronic diastolic heart failure (HCC) Stable, continues on lasix. Will follow up labs.  - COMPLETE METABOLIC PANEL WITH GFR; Future - CBC with Differential/Platelet; Future  3. CKD (chronic kidney disease) stage 4, GFR 15-29 ml/min (HCC) -following with  nephrology but has not had recent labs, will update this week.  - COMPLETE METABOLIC PANEL WITH GFR; Future - CBC with Differential/Platelet; Future  4. Multiple skin nodules -pt and wife would like nodules evaluated, they have pending appt at dermatology in August and willing to wait until this appt time. Agreeable to hospice but want to make sure these nodules can be evaluated.   5. Gout,  unspecified cause, unspecified chronicity, unspecified site -continues on allopurinol  - Uric acid; Future  6. Pulmonary nodule suspicious for malignancy however they do not wish for further work up or evaluation. Discussed hospice and palliative care referral. He may qualify for hospice which they are interested in as approach is comfort.    Drew Gibson. Harle Battiest  [facility] [phone]    Virtual Visit via Telephone Note  I connected with pt and wife on 03/23/19 at 10:30 AM EDT by telephone and verified that I am speaking with the correct person using two identifiers.  Location: Drew Gibson: home Provider: office   I discussed the limitations, risks, security and privacy concerns of performing an evaluation and management service by telephone and the availability of in person appointments. I also discussed with the Drew Gibson that there may be a Drew Gibson responsible charge related to this service. The Drew Gibson expressed understanding and agreed to proceed.   I discussed the assessment and treatment plan with the Drew Gibson. The Drew Gibson was provided an opportunity to ask questions and all were answered. The Drew Gibson agreed with the plan and demonstrated an understanding of the instructions.   The Drew Gibson was advised to call back or seek an in-person evaluation if the symptoms worsen or if the condition fails to improve as anticipated.  I provided 16 minutes of non-face-to-face time during this  encounter.  Drew Gibson. Harle Battiest Avs printed and mailed

## 2019-03-24 LAB — COMPLETE METABOLIC PANEL WITH GFR
AG Ratio: 1.3 (calc) (ref 1.0–2.5)
ALT: 28 U/L (ref 9–46)
AST: 26 U/L (ref 10–35)
Albumin: 3.5 g/dL — ABNORMAL LOW (ref 3.6–5.1)
Alkaline phosphatase (APISO): 147 U/L — ABNORMAL HIGH (ref 35–144)
BUN/Creatinine Ratio: 26 (calc) — ABNORMAL HIGH (ref 6–22)
BUN: 63 mg/dL — ABNORMAL HIGH (ref 7–25)
CO2: 25 mmol/L (ref 20–32)
Calcium: 8.8 mg/dL (ref 8.6–10.3)
Chloride: 105 mmol/L (ref 98–110)
Creat: 2.46 mg/dL — ABNORMAL HIGH (ref 0.70–1.11)
GFR, Est African American: 26 mL/min/{1.73_m2} — ABNORMAL LOW (ref >=60)
GFR, Est Non African American: 23 mL/min/{1.73_m2} — ABNORMAL LOW (ref >=60)
Globulin: 2.7 g/dL (calc) (ref 1.9–3.7)
Glucose, Bld: 80 mg/dL (ref 65–139)
Potassium: 4.5 mmol/L (ref 3.5–5.3)
Sodium: 140 mmol/L (ref 135–146)
Total Bilirubin: 0.4 mg/dL (ref 0.2–1.2)
Total Protein: 6.2 g/dL (ref 6.1–8.1)

## 2019-03-24 LAB — CBC WITH DIFFERENTIAL/PLATELET
Absolute Monocytes: 502 cells/uL (ref 200–950)
Basophils Absolute: 29 cells/uL (ref 0–200)
Basophils Relative: 0.5 %
Eosinophils Absolute: 148 cells/uL (ref 15–500)
Eosinophils Relative: 2.6 %
HCT: 38.7 % (ref 38.5–50.0)
Hemoglobin: 12.9 g/dL — ABNORMAL LOW (ref 13.2–17.1)
Lymphs Abs: 1921 cells/uL (ref 850–3900)
MCH: 32.9 pg (ref 27.0–33.0)
MCHC: 33.3 g/dL (ref 32.0–36.0)
MCV: 98.7 fL (ref 80.0–100.0)
MPV: 11.8 fL (ref 7.5–12.5)
Monocytes Relative: 8.8 %
Neutro Abs: 3101 cells/uL (ref 1500–7800)
Neutrophils Relative %: 54.4 %
Platelets: 117 10*3/uL — ABNORMAL LOW (ref 140–400)
RBC: 3.92 10*6/uL — ABNORMAL LOW (ref 4.20–5.80)
RDW: 15.6 % — ABNORMAL HIGH (ref 11.0–15.0)
Total Lymphocyte: 33.7 %
WBC: 5.7 10*3/uL (ref 3.8–10.8)

## 2019-03-24 LAB — URIC ACID: Uric Acid, Serum: 7 mg/dL (ref 4.0–8.0)

## 2019-03-24 NOTE — Progress Notes (Signed)
[facility] Pall[facility] Consult Note Telephone: [phone]  Fax: [phone]  PATIENT NAME: Drew Gibson DOB: 24-Apr-1929 MRN: [id number]  PRIMARY CARE PROVIDER:   [name], NP  REFERRING PROVIDER:  [name], NP [place],  [place] [address]  RESPONSIBLE PARTY:   Self and wife Selassie Spatafore)    RECOMMENDATIONS and PLAN:  Palliative Care Encounter  Z51.5  1.  Shortness of breath:  Related to advanced CHF and deconditioning.  2.  Pullmonary nodule:  No desire or plans for additional diagnostic evaluations.   3.  Cutaneous nodules:  Unknown cause and wife desires a biopsy for specific determination. Next available dermatology appointment is in August.  Consider referral to general surgeon for bx. Addendum:  Earlier appointment made for July 10 with surgical dermatologist after phone call to same.  Pt desires receiving diagnosis of nodules.   4.  Advanced care planning:  Explanation of Palliative and Hospice Care with pt and wife via phone.  Pt's goals are to live remainder of life at home without return to the hospital.  He does not want any escalation of medical diagnostics or treatment of any potential lung cancer.  He does desire to know the diagnosis of the multiple cutaneous nodules that are newly developing.  He will consider transition to Hospice care after skin biopsy if no improvement of overall symptoms. He agrees to antibiotics if necessary but no tube feedings. Palliative Care will followup after biopsy.   Due to the COVID-19 crisis, this visit was performed telephonically from my office and was initiated and consented by the patient and or family.   I spent 45 minutes providing this consultation,  from 1000 to 1045. More than 50% of the time in this consultation was spent coordinating communication with patient, wife,  Sherrie Mustache, NP and Dr. Tomasa Hosteller who agrees with plans for Hospice care after skin  biopsies.   HISTORY OF PRESENT ILLNESS:  Drew Gibson is a 85 y.o. year old male with multiple medical problems including CHF(Echo in 2019 55-60% EF with severely dilated R atrium), pulmonary nodule suspected malignancy without biopsy, new painful cutaneous nodules on trunk. Wife reports increased weakness, day and night time sleeping, increased shortness of breath with short distance ambulation.  Palliative Care was asked to help address goals of care.   CODE STATUS: DNAR/DNI  PPS: 40% HOSPICE ELIGIBILITY/DIAGNOSIS: YES/ Lung cancer, advanced CHF  PAST MEDICAL HISTORY:  Past Medical History:  Diagnosis Date  . Abnormality of gait   . Benign neoplasm of colon   . Benign prostate hyperplasia   . Chronic kidney disease, stage IV (severe) ([place])   . Closed fracture of ramus of right pubis ([place])   . Complication of anesthesia    postop headache after general anesthesia  . Degenerative arthritis   . Diverticulosis of colon (without mention of hemorrhage)   . Duodenal ulcer, unspecified as acute or chronic, without hemorrhage, perforation, or obstruction   . Encounter for long-term (current) use of other medications   . Enlarged prostate    takes Tamsulosin daily  . Essential and other specified forms of tremor   . Foley catheter in place 09-01-13   4 months now  . Foot drop, bilateral   . Gait disorder    balance issues-uses walker or mobile chair.  . Gout    takes Allopurinol daily  . Gout, unspecified   . Hyperlipidemia    but doesn't require meds  .  Hypertension    takes Enalapril daily   . Inguinal hernia without mention of obstruction or gangrene, unilateral or unspecified, (not specified as recurrent)   . Internal hemorrhoids without mention of complication   . Macular degeneration    takes eye cap vits daily  . Other abnormal blood chemistry   . PAD (peripheral artery disease) ([place])   . Peripheral neuropathy   . Peripheral neuropathy   . Pneumonia    hx of;last time  8'14-mild.  . Ptosis    Left side  . Spinal stenosis, lumbar region, with neurogenic claudication   . Syncope   . TIA (transient ischemic attack)    sees Dr.Willis-neurologist 6'14(strange feeling of head)-no residual  . Unspecified vitamin D deficiency   . Urinary frequency   . Urinary urgency   . UTI (lower urinary tract infection) 09-01-13   tx. 2 weeks ago    PERTINENT MEDICATIONS:  Outpatient Encounter Medications as of 03/20/2019  Medication Sig  . acetaminophen (TYLENOL) 500 MG tablet Take 500 mg by mouth at bedtime.   Marland Kitchen allopurinol (ZYLOPRIM) 100 MG tablet Take 2 tablets by mouth once daily  . b complex vitamins capsule Take 1 capsule by mouth daily.   . Cholecalciferol (VITAMIN D3) 2000 UNITS TABS Take 2,000 Units by mouth daily.   . clopidogrel (PLAVIX) 75 MG tablet TAKE 1 TABLET BY MOUTH ONCE DAILY FOR ANTICOAGULATION.  . finasteride (PROSCAR) 5 MG tablet TAKE 1 TABLET BY MOUTH ONCE DAILY TO HELP SHRINK PROSTATE.  . furosemide (LASIX) 20 MG tablet Take 1 tablet (20 mg total) by mouth as needed for edema. Ok to increase to 40 mg if swelling presents per kidney specialist  . Multiple Vitamins-Minerals (PRESERVISION AREDS 2 PO) Take 1 tablet by mouth daily.  . primidone (MYSOLINE) 50 MG tablet TAKE 1 TABLET BY MOUTH ONCE DAILY IN THE EVENING TO HELP WITH TREMORS.  Marland Kitchen Tetrahydrozoline HCl (VISINE OP) Apply to eye. As needed for dry eye  . [DISCONTINUED] metoprolol tartrate (LOPRESSOR) 25 MG tablet Take 1 tablet (25 mg total) by mouth 2 (two) times daily.   No facility-administered encounter medications on file as of 03/20/2019.     PHYSICAL EXAM:   General: NAD, frail , thin Cardiovascular: regular rate and rhythm Pulmonary: No obvious increased respiratory effort Abdomen: tender upon palpation by wife Extremities: edema of BLE per wife Skin: reports multiple nodules of scalp, shoulder and trunk. Unable to visualize Neurological: Alert and oriented. Weakness.  Answers  questions appropriately.  Gonzella Lex, NP-C

## 2019-03-27 ENCOUNTER — Other Ambulatory Visit: Payer: Self-pay

## 2019-03-27 ENCOUNTER — Other Ambulatory Visit: Payer: Medicare Other | Admitting: Internal Medicine

## 2019-03-31 ENCOUNTER — Other Ambulatory Visit: Payer: Self-pay | Admitting: *Deleted

## 2019-03-31 MED ORDER — FUROSEMIDE 20 MG PO TABS: 20.0000 mg | ORAL_TABLET | ORAL | 5 refills | Status: AC | PRN

## 2019-03-31 NOTE — Telephone Encounter (Signed)
Wife Requested refill.  ?

## 2019-04-01 DIAGNOSIS — B351 Tinea unguium: Secondary | ICD-10-CM | POA: Diagnosis not present

## 2019-04-01 DIAGNOSIS — M79671 Pain in right foot: Secondary | ICD-10-CM | POA: Diagnosis not present

## 2019-04-01 DIAGNOSIS — M79672 Pain in left foot: Secondary | ICD-10-CM | POA: Diagnosis not present

## 2019-04-01 DIAGNOSIS — L84 Corns and callosities: Secondary | ICD-10-CM | POA: Diagnosis not present

## 2019-04-10 ENCOUNTER — Other Ambulatory Visit: Payer: Medicare Other | Admitting: Internal Medicine

## 2019-04-10 DIAGNOSIS — L578 Other skin changes due to chronic exposure to nonionizing radiation: Secondary | ICD-10-CM | POA: Diagnosis not present

## 2019-04-10 DIAGNOSIS — L989 Disorder of the skin and subcutaneous tissue, unspecified: Secondary | ICD-10-CM | POA: Diagnosis not present

## 2019-04-10 DIAGNOSIS — D485 Neoplasm of uncertain behavior of skin: Secondary | ICD-10-CM | POA: Diagnosis not present

## 2019-04-10 DIAGNOSIS — R238 Other skin changes: Secondary | ICD-10-CM | POA: Diagnosis not present

## 2019-04-10 DIAGNOSIS — Z85820 Personal history of malignant melanoma of skin: Secondary | ICD-10-CM | POA: Diagnosis not present

## 2019-04-13 ENCOUNTER — Telehealth: Payer: Self-pay | Admitting: Internal Medicine

## 2019-04-13 NOTE — Progress Notes (Signed)
[facility] Consult Note Telephone: [phone]  Fax: [phone]  PATIENT NAME: Drew Gibson DOB: 01-08-1929 MRN: [id number]  PRIMARY CARE PROVIDER:   [name], NP  REFERRING PROVIDER:  [name], NP [place],  [place] [address]  RESPONSIBLE PARTY:   Self and wife Inez Catalina Ogarro)    RECOMMENDATIONS and PLAN:  Palliative Care Encounter  Z51.5  1.  Shortness of breath:  Continued decline with minimal reserve.  Related to advanced CHF and suspected lung cancer.  Supportive care and limit exertional activities.  Monitor oximetry and provide supplemental O2 for home use prn.  Plans for transition to Hospice.  2.  Pullmonary nodule:  Still no desire or plans for additional diagnostic evaluations or treatment.     3.  Cutaneous nodules:  Diagnostic biopies x 3 performed today per wife.  Pt still does not want treatment despite the final pathologic findings.  " The doctor thinks it might be a return of his melanoma" per wife.  Tylenol prn discomfort.  4.  Advanced care planning:  Patient and wife express desire to transition to Hospice care due to his progressive weakness and increased difficulty in attempting to get out of bed, get to [facility] for mobility and requires increased assistance with ADLs.  She states that his shortness of breath and coughing have also worsened.  Denies fever.  His goal now is for comfort care and will require a hospital bed due to functional decline.  Telephone consult for updates with Dr. Tomasa Hosteller and Marlowe Sax, NP who agree upon Hospice eligibility if pt continues on the same trajectory without additional treatments.  Hospice referral order received and the referral center for [facility] was notified.    Due to the COVID-19 crisis, this visit was performed telephonically from my office and was initiated and consented by the patient and or family.   I spent 30 minutes providing this  consultation,  from 1530 to 1400. More than 50% of the time in this consultation was spent coordinating communication with patient, wife.    HISTORY OF PRESENT ILLNESS: Follow-up with [name] Section.  Wife reports increased shorthness of breath and cough with minimal exertion, weakness with difficulty getting OOB and into WC.  Palliative Care was asked to help address goals of care since findings of suspected lung cancer and CHF.Marland Kitchen   CODE STATUS: DNAR/DNI  PPS: 30% HOSPICE ELIGIBILITY/DIAGNOSIS: YES/ Lung cancer, advanced CHF. Suspected recurrent melanoma  PAST MEDICAL HISTORY:  Past Medical History:  Diagnosis Date  . Abnormality of gait   . Benign neoplasm of colon   . Benign prostate hyperplasia   . Chronic kidney disease, stage IV (severe) ([place])   . Closed fracture of ramus of right pubis ([place])   . Complication of anesthesia    postop headache after general anesthesia  . Degenerative arthritis   . Diverticulosis of colon (without mention of hemorrhage)   . Duodenal ulcer, unspecified as acute or chronic, without hemorrhage, perforation, or obstruction   . Encounter for long-term (current) use of other medications   . Enlarged prostate    takes Tamsulosin daily  . Essential and other specified forms of tremor   . Foley catheter in place 09-01-13   4 months now  . Foot drop, bilateral   . Gait disorder    balance issues-uses walker or mobile chair.  . Gout    takes Allopurinol daily  . Gout, unspecified   .  Hyperlipidemia    but doesn't require meds  . Hypertension    takes Enalapril daily   . Inguinal hernia without mention of obstruction or gangrene, unilateral or unspecified, (not specified as recurrent)   . Internal hemorrhoids without mention of complication   . Macular degeneration    takes eye cap vits daily  . Other abnormal blood chemistry   . PAD (peripheral artery disease) ([place])   . Peripheral neuropathy   . Peripheral neuropathy   . Pneumonia    hx of;last  time 8'14-mild.  . Ptosis    Left side  . Spinal stenosis, lumbar region, with neurogenic claudication   . Syncope   . TIA (transient ischemic attack)    sees Dr.Willis-neurologist 6'14(strange feeling of head)-no residual  . Unspecified vitamin D deficiency   . Urinary frequency   . Urinary urgency   . UTI (lower urinary tract infection) 09-01-13   tx. 2 weeks ago    PERTINENT MEDICATIONS:  Outpatient Encounter Medications as of 03/20/2019  Medication Sig  . acetaminophen (TYLENOL) 500 MG tablet Take 500 mg by mouth at bedtime.   Marland Kitchen allopurinol (ZYLOPRIM) 100 MG tablet Take 2 tablets by mouth once daily  . b complex vitamins capsule Take 1 capsule by mouth daily.   . Cholecalciferol (VITAMIN D3) 2000 UNITS TABS Take 2,000 Units by mouth daily.   . clopidogrel (PLAVIX) 75 MG tablet TAKE 1 TABLET BY MOUTH ONCE DAILY FOR ANTICOAGULATION.  . finasteride (PROSCAR) 5 MG tablet TAKE 1 TABLET BY MOUTH ONCE DAILY TO HELP SHRINK PROSTATE.  . furosemide (LASIX) 20 MG tablet Take 1 tablet (20 mg total) by mouth as needed for edema. Ok to increase to 40 mg if swelling presents per kidney specialist  . Multiple Vitamins-Minerals (PRESERVISION AREDS 2 PO) Take 1 tablet by mouth daily.  . primidone (MYSOLINE) 50 MG tablet TAKE 1 TABLET BY MOUTH ONCE DAILY IN THE EVENING TO HELP WITH TREMORS.  Marland Kitchen Tetrahydrozoline HCl (VISINE OP) Apply to eye. As needed for dry eye  . [DISCONTINUED] metoprolol tartrate (LOPRESSOR) 25 MG tablet Take 1 tablet (25 mg total) by mouth 2 (two) times daily.   No facility-administered encounter medications on file as of 03/20/2019.     PHYSICAL EXAM:   General: NAD noted during phone conversation Pulmonary: no forced speech Neurological: Alert and oriented. Sounds weak  Answers questions appropriately.  Gonzella Lex, NP-C

## 2019-04-13 NOTE — Telephone Encounter (Signed)
Phoned Mrs. Haag to inform she and Mr. Sees that I contacted the Hospice MD and his PCP office and spoke to Marlowe Sax, NP for Sherrie Mustache, NP and the agreement is to transition his care to Hospice. [name] and Dr. Hollace Kinnier will be his attending of record.  The Hospice referral center from [place] will contact them for an admission visit.  Still no final report from recent pathology report from skin nodules.  She agreed with this plan.  Support given.     Gonzella Lex, NP-C

## 2019-04-14 ENCOUNTER — Telehealth: Payer: Self-pay | Admitting: Family

## 2019-04-14 NOTE — Telephone Encounter (Signed)
Received phone call 04/13/2019 from [facility] states patient had cutaneous nodule biopsy done awaiting results but does not want any further treatment for lung cancer.Has had worsening CHF unable to walk to the bathroom as usual due to cough and shortness of breath.Has to be assisted to wheelchair.Request change from palliative care to Hospice.Hospice consult order written and faxed to [phone].

## 2019-04-16 DIAGNOSIS — H43813 Vitreous degeneration, bilateral: Secondary | ICD-10-CM | POA: Diagnosis not present

## 2019-04-16 DIAGNOSIS — H353122 Nonexudative age-related macular degeneration, left eye, intermediate dry stage: Secondary | ICD-10-CM | POA: Diagnosis not present

## 2019-04-16 DIAGNOSIS — Z961 Presence of intraocular lens: Secondary | ICD-10-CM | POA: Diagnosis not present

## 2019-04-16 DIAGNOSIS — H353211 Exudative age-related macular degeneration, right eye, with active choroidal neovascularization: Secondary | ICD-10-CM | POA: Diagnosis not present

## 2019-04-20 ENCOUNTER — Telehealth: Payer: Self-pay | Admitting: *Deleted

## 2019-04-20 NOTE — Telephone Encounter (Signed)
Patient wife, betty called and stated that she just wanted to call to give you a report of the biopsy patient had. Stated that the biopsy shows Melanoma. Hospice was set up last week.  Patient has a TeleVisit scheduled for 04/23/19

## 2019-04-20 NOTE — Telephone Encounter (Signed)
Thank you for the update!

## 2019-04-23 ENCOUNTER — Ambulatory Visit (INDEPENDENT_AMBULATORY_CARE_PROVIDER_SITE_OTHER): Payer: Medicare Other | Admitting: Nurse Practitioner

## 2019-04-23 ENCOUNTER — Other Ambulatory Visit: Payer: Self-pay

## 2019-04-23 ENCOUNTER — Encounter: Payer: Self-pay | Admitting: Nurse Practitioner

## 2019-04-23 DIAGNOSIS — R531 Weakness: Secondary | ICD-10-CM | POA: Diagnosis not present

## 2019-04-23 DIAGNOSIS — I5032 Chronic diastolic (congestive) heart failure: Secondary | ICD-10-CM | POA: Diagnosis not present

## 2019-04-23 DIAGNOSIS — C439 Malignant melanoma of skin, unspecified: Secondary | ICD-10-CM | POA: Diagnosis not present

## 2019-04-23 DIAGNOSIS — L89151 Pressure ulcer of sacral region, stage 1: Secondary | ICD-10-CM

## 2019-04-23 DIAGNOSIS — N184 Chronic kidney disease, stage 4 (severe): Secondary | ICD-10-CM

## 2019-04-23 NOTE — Progress Notes (Signed)
This service is provided via telemedicine  No vital signs collected/recorded due to the encounter was a telemedicine visit.   Location of patient (ex: home, work):  Home  Patient consents to a telephone visit:  Yes  Location of the provider (ex: office, home):  [facility], Office   Name of any referring provider: N/A  Names of all persons participating in the telemedicine service and their role in the encounter:  S.Chrae B/CMA, Sherrie Mustache, NP, Mrs.Wehrenberg, and Patient   Time spent on call:  10 min with medical assistant      Careteam: Patient Care Team: [name], NP as PCP - General (Geriatric Medicine) Jettie Booze, MD as PCP - Cardiology (Cardiology) [name], MD as Consulting Physician (Nephrology)  Advanced Directive information Does Patient Have a Medical Advance Directive?: Yes, Type of Advance Directive: [place];Living will;Out of facility DNR (pink MOST or yellow form), Does patient want to make changes to medical advance directive?: No - Patient declined  Allergies  Allergen Reactions  . Aleve [Naproxen Sodium] Other (See Comments)    Upset stomach   . Antihistamines, Diphenhydramine-Type Other (See Comments)    Causes urinary issues  . Dimenhydrinate Other (See Comments)    Causes urinary issues Causes urinary issues Causes urinary issues Causes urinary issues  . Nsaids     upset stomach  . Tolmetin Other (See Comments)    Bother his stomach Bother his stomach    Chief Complaint  Patient presents with  . Medical Management of Chronic Issues    3 month follow-up, telephone visit.   . Immunizations    Discuss need for PNA and Shingrix      HPI: Patient is a 85 y.o. male for routine follow up and review of biopsy of skin lesion.  Biopsy of skin lesions revealed melanoma. He had previous melanoma on his face a few years ago and it was removed by a melanoma specialist.  Another lesion has  popped up and reports it was uncomfortable, burning and itching. Tylenol has been effective.  He was admitted to hospice after the diagnosis was made. Nurse following up again today. Wife has been very happy with the services.   Strength has been decreasing. Having a harder time getting up and getting into bed.   Would like hospital bed due to needing to be repositioned and weakness. Shortness of breath has also been worse with cough.  Have had to prop him up with pillows at night.   Wife has been needing zinc oxide cream on his buttock due to 2 areas that change color frequently    CHF- ongoing cough and shortness of breath with lower leg edema, had increase lasix to 40 mg daily for several days and now back to lasix 20 mg daily for 3 days.   htn- 146/85, 154/91, 132/80, 135/85 on lopressor twice daily and lasix  Tremor- severe but has not progressed due to primidone daily. Continues to fed himself. Hospice will not pay for primidone but 18$ for 3 months.   Review of Systems:  Review of Systems  Constitutional: Negative for chills, fever and malaise/fatigue.  Respiratory: Positive for cough. Negative for sputum production, shortness of breath and wheezing.   Cardiovascular: Positive for leg swelling. Negative for chest pain, palpitations and orthopnea.  Gastrointestinal: Negative for abdominal pain.  Genitourinary: Positive for frequency. Negative for dysuria and urgency.  Musculoskeletal: Negative for joint pain and myalgias.  Skin: Positive for itching.  Multiple skin nodule.   Neurological: Positive for tremors and weakness. Negative for dizziness and headaches.  Psychiatric/Behavioral: Negative for depression. The patient is not nervous/anxious and does not have insomnia.     Past Medical History:  Diagnosis Date  . Abnormality of gait   . Benign neoplasm of colon   . Benign prostate hyperplasia   . Chronic kidney disease, stage IV (severe) ([place])   . Closed fracture of  ramus of right pubis ([place])   . Complication of anesthesia    postop headache after general anesthesia  . Degenerative arthritis   . Diverticulosis of colon (without mention of hemorrhage)   . Duodenal ulcer, unspecified as acute or chronic, without hemorrhage, perforation, or obstruction   . Encounter for long-term (current) use of other medications   . Enlarged prostate    takes Tamsulosin daily  . Essential and other specified forms of tremor   . Foley catheter in place 09-01-13   4 months now  . Foot drop, bilateral   . Gait disorder    balance issues-uses walker or mobile chair.  . Gout    takes Allopurinol daily  . Gout, unspecified   . Hyperlipidemia    but doesn't require meds  . Hypertension    takes Enalapril daily   . Inguinal hernia without mention of obstruction or gangrene, unilateral or unspecified, (not specified as recurrent)   . Internal hemorrhoids without mention of complication   . Macular degeneration    takes eye cap vits daily  . Other abnormal blood chemistry   . PAD (peripheral artery disease) ([place])   . Peripheral neuropathy   . Peripheral neuropathy   . Pneumonia    hx of;last time 8'14-mild.  . Ptosis    Left side  . Spinal stenosis, lumbar region, with neurogenic claudication   . Syncope   . TIA (transient ischemic attack)    sees Dr.Willis-neurologist 6'14(strange feeling of head)-no residual  . Unspecified vitamin D deficiency   . Urinary frequency   . Urinary urgency   . UTI (lower urinary tract infection) 09-01-13   tx. 2 weeks ago   Past Surgical History:  Procedure Laterality Date  . CATARACT EXTRACTION Right 08/31/2014   Dr.Lyles  . colonosocpy    . FINGER SURGERY Left    Left ring finger  . HERNIA REPAIR     at least 34yrs ago  . HIP PINNING,CANNULATED Left 05/05/2013   Procedure: CANNULATED HIP PINNING- left;  Surgeon: Renette Butters, MD;  Location: [facility];  Service: Orthopedics;  Laterality: Left;  . INGUINAL HERNIA REPAIR   09/05/2012   Procedure: LAPAROSCOPIC INGUINAL HERNIA;  Surgeon: Gayland Curry, MD,FACS;  Location: [facility];  Service: General;  Laterality: Left;  . INSERTION OF MESH  09/05/2012   Procedure: INSERTION OF MESH;  Surgeon: Gayland Curry, MD,FACS;  Location: [facility];  Service: General;  Laterality: Left;  . left leg surgery  2011  . NERVE BIOPSY    . TRANSURETHRAL RESECTION OF PROSTATE N/A 09/07/2013   Procedure: CYSTO TRANSURETHRAL RESECTION OF THE PROSTATE (TURP);  Surgeon: Reece Packer, MD;  Location: [facility];  Service: Urology;  Laterality: N/A;   Social History:   reports that he quit smoking about 33 years ago. His smoking use included cigarettes. He has a 40.00 pack-year smoking history. He quit smokeless tobacco use about 18 years ago.  His smokeless tobacco use included chew. He reports that he does not drink alcohol or use drugs.  Family History  Problem Relation Age of Onset  . Diabetes Mother        type 2  . Clotting disorder Mother        deceased, blood clot  . Leukemia Father   . COPD Brother   . Lung cancer Sister   . Heart disease Brother        1/2 brother  . COPD Brother     Medications: Patient's Medications  New Prescriptions   No medications on file  Previous Medications   ACETAMINOPHEN (TYLENOL) 500 MG TABLET    Take 500 mg by mouth at bedtime.    ALLOPURINOL (ZYLOPRIM) 100 MG TABLET    Take 2 tablets by mouth once daily   B COMPLEX VITAMINS CAPSULE    Take 1 capsule by mouth daily.    CHOLECALCIFEROL (VITAMIN D3) 2000 UNITS TABS    Take 2,000 Units by mouth daily.    CLOPIDOGREL (PLAVIX) 75 MG TABLET    TAKE 1 TABLET BY MOUTH ONCE DAILY FOR ANTICOAGULATION.   FINASTERIDE (PROSCAR) 5 MG TABLET    TAKE 1 TABLET BY MOUTH ONCE DAILY TO HELP SHRINK PROSTATE.   FUROSEMIDE (LASIX) 20 MG TABLET    Take 1 tablet (20 mg total) by mouth as needed for edema. Ok to increase to 40 mg if swelling presents per kidney specialist   METOPROLOL TARTRATE (LOPRESSOR) 25 MG TABLET     Take 1 tablet (25 mg total) by mouth 2 (two) times daily.   MULTIPLE VITAMINS-MINERALS (PRESERVISION AREDS 2 PO)    Take 1 tablet by mouth daily.   PRIMIDONE (MYSOLINE) 50 MG TABLET    TAKE 1 TABLET BY MOUTH ONCE DAILY IN THE EVENING TO HELP WITH TREMORS.   TETRAHYDROZOLINE HCL (VISINE OP)    Apply to eye. As needed for dry eye  Modified Medications   No medications on file  Discontinued Medications   No medications on file    Physical Exam:  There were no vitals filed for this visit. There is no height or weight on file to calculate BMI. Wt Readings from Last 3 Encounters:  03/11/19 177 lb (80.3 kg)  03/04/19 168 lb (76.2 kg)  12/11/18 184 lb 12.8 oz (83.8 kg)     Labs reviewed: Basic Metabolic Panel: Recent Labs    10/22/18 1512 12/10/18 0945 03/23/19 1142  NA 137 138 140  K 5.1 4.9 4.5  CL 102 104 105  CO2 24 25 25   GLUCOSE 74 65 80  BUN 51* 61* 63*  CREATININE 2.23* 2.54* 2.46*  CALCIUM 9.0 9.2 8.8   Liver Function Tests: Recent Labs    07/16/18 1115 03/23/19 1142  AST 31 26  ALT 37 28  BILITOT 0.3 0.4  PROT 6.5 6.2   No results for input(s): LIPASE, AMYLASE in the last 8760 hours. No results for input(s): AMMONIA in the last 8760 hours. CBC: Recent Labs    07/16/18 1115 12/10/18 0945 03/23/19 1142  WBC 7.1 5.4 5.7  NEUTROABS 4,303 3,213 3,101  HGB 12.8* 12.9* 12.9*  HCT 38.0* 38.6 38.7  MCV 94.8 96.3 98.7  PLT 166 120* 117*   Lipid Panel: No results for input(s): CHOL, HDL, LDLCALC, TRIG, CHOLHDL, LDLDIRECT in the last 8760 hours. TSH: No results for input(s): TSH in the last 8760 hours. A1C: No results found for: HGBA1C   Assessment/Plan 1. Pressure injury of sacral region, stage 1 -continues to use zinc oxide to sacral region, area will become discolored if he stays  on area too long, requiring frequent repositioning. . - For home use only DME Hospital bed  2. Chronic diastolic heart failure (HCC) Stable, continues to use lasix 20 mg  and will increase for fluid retention. Continues on lopressor 25 mg BID - For home use only DME Hospital bed  3. Melanoma of skin ([place]) - noted on biopsy of lesions, suspect lesion in lung is also due to melanoma. Has been referred to hospice at this time, would not like further work up or intervention other than comfort approach and supportive care.  - For home use only DME Hospital bed  4. Weakness Progressive weakness.  - For home use only DME Hospital bed  5. CKD (chronic kidney disease) stage 4, GFR 15-29 ml/min (HCC) Ongoing, following with nephrologist.   To follow up as needed and through hospice. Drew Gibson. Harle Battiest  [facility] [phone]    Virtual Visit via Telephone Note  I connected with pt and wife on 04/23/19 at 11:00 AM EDT by telephone and verified that I am speaking with the correct person using two identifiers.  Location: Patient: home Provider: office   I discussed the limitations, risks, security and privacy concerns of performing an evaluation and management service by telephone and the availability of in person appointments. I also discussed with the patient that there may be a patient responsible charge related to this service. The patient expressed understanding and agreed to proceed.   I discussed the assessment and treatment plan with the patient. The patient was provided an opportunity to ask questions and all were answered. The patient agreed with the plan and demonstrated an understanding of the instructions.   The patient was advised to call back or seek an in-person evaluation if the symptoms worsen or if the condition fails to improve as anticipated.  I provided 22 minutes of non-face-to-face time during this encounter.  Drew Gibson. Harle Battiest Avs printed and mailed

## 2019-05-22 ENCOUNTER — Telehealth: Payer: Self-pay | Admitting: *Deleted

## 2019-05-22 NOTE — Telephone Encounter (Signed)
Nurse calling stating that pt has an increased wet cough and swelling in arm (not sure which one) they are asking if he can increase lasix? Pt currently taking 20mg  twice daily. Please advise

## 2019-05-22 NOTE — Telephone Encounter (Signed)
Would increase to 40 mg in the morning and continue 20 mg in the evening for 3 days to see if this improves symptoms then to resume prior 20 mg BID dosing, to call office if not seeing improvement on Monday or if they need to set up tele or in person visit

## 2019-05-22 NOTE — Telephone Encounter (Signed)
Spoke with nurse and advised results  

## 2019-06-02 IMAGING — CR DG CHEST 2V
2 series · 2 of 2 positions shown · non-contrast
Comparison: 02/26/2018 chest radiograph.

CLINICAL DATA: Follow-up pneumonia

EXAM:
CHEST - 2 VIEW

[w chest pa]
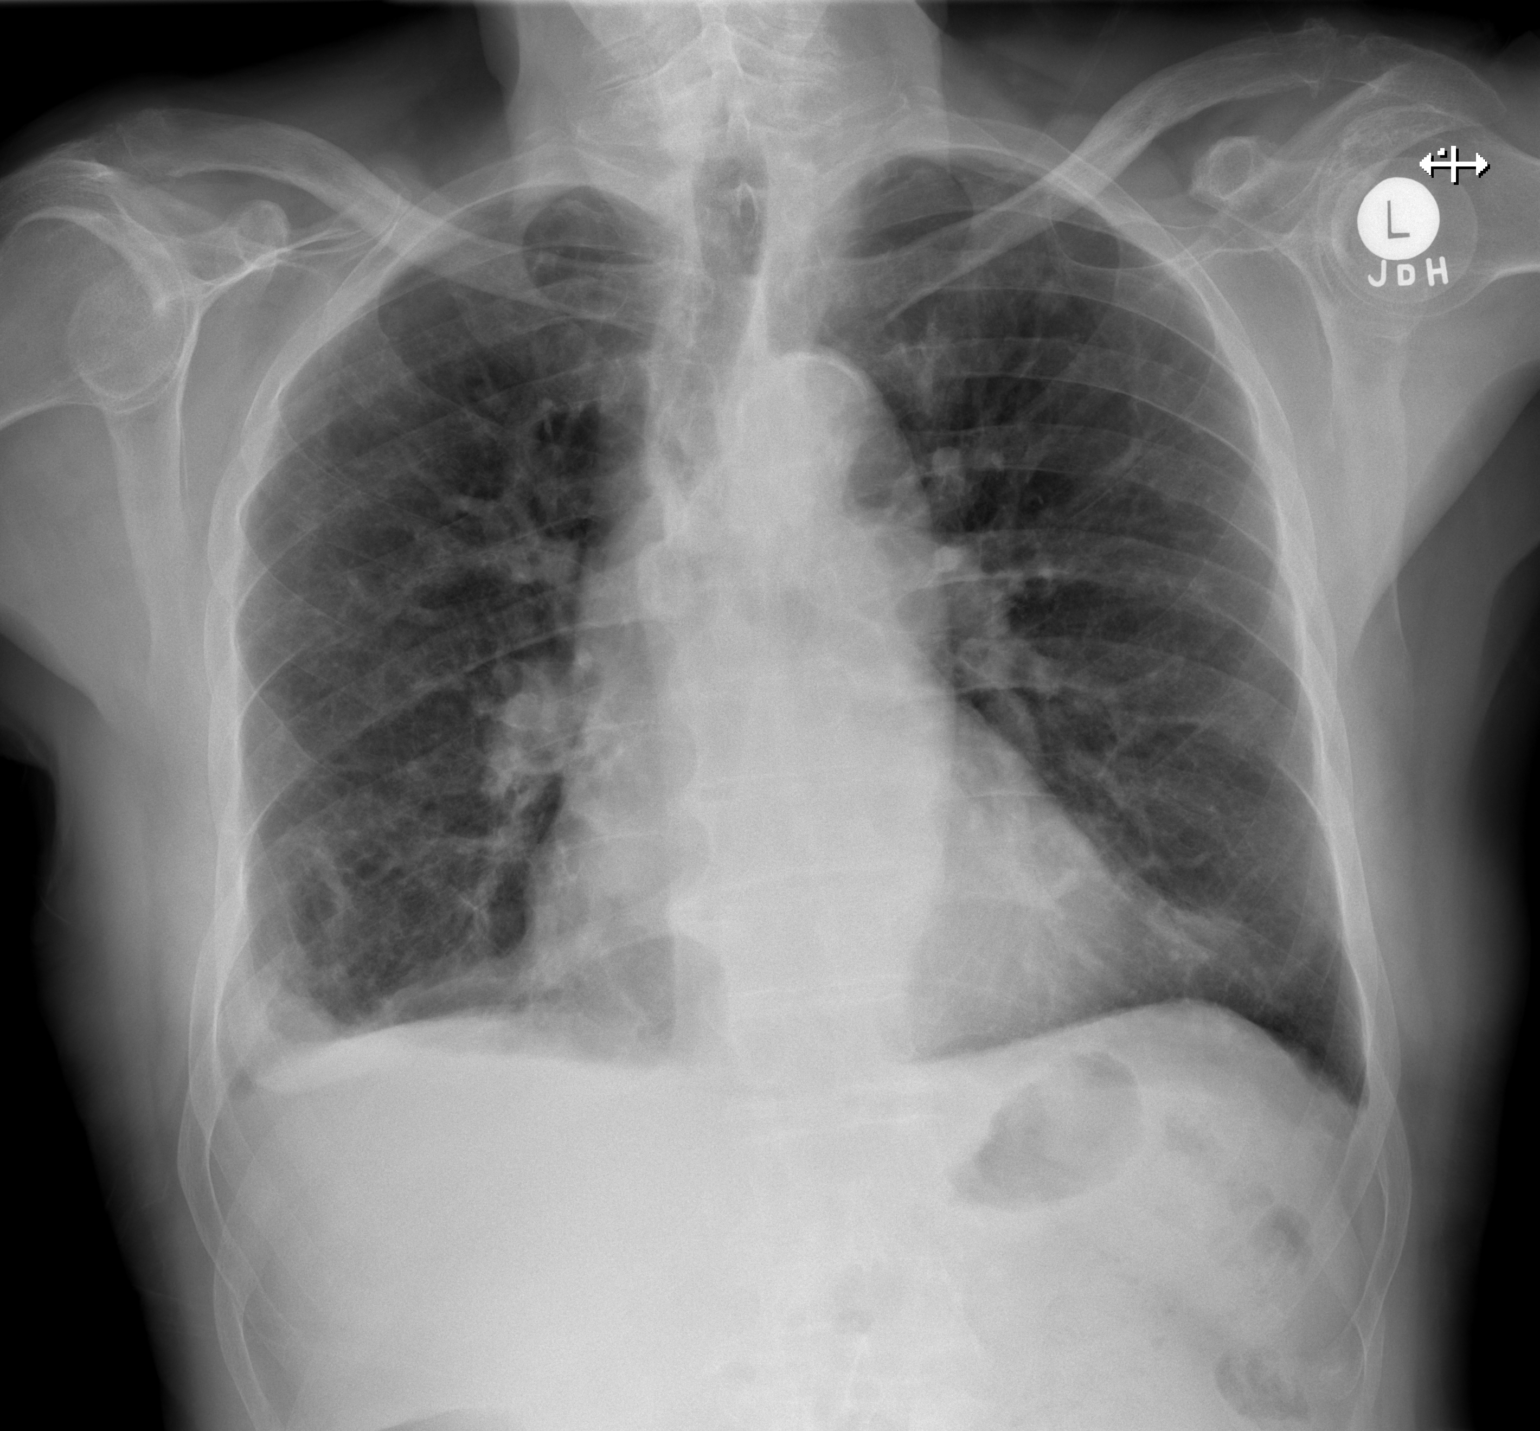

[w chest lat]
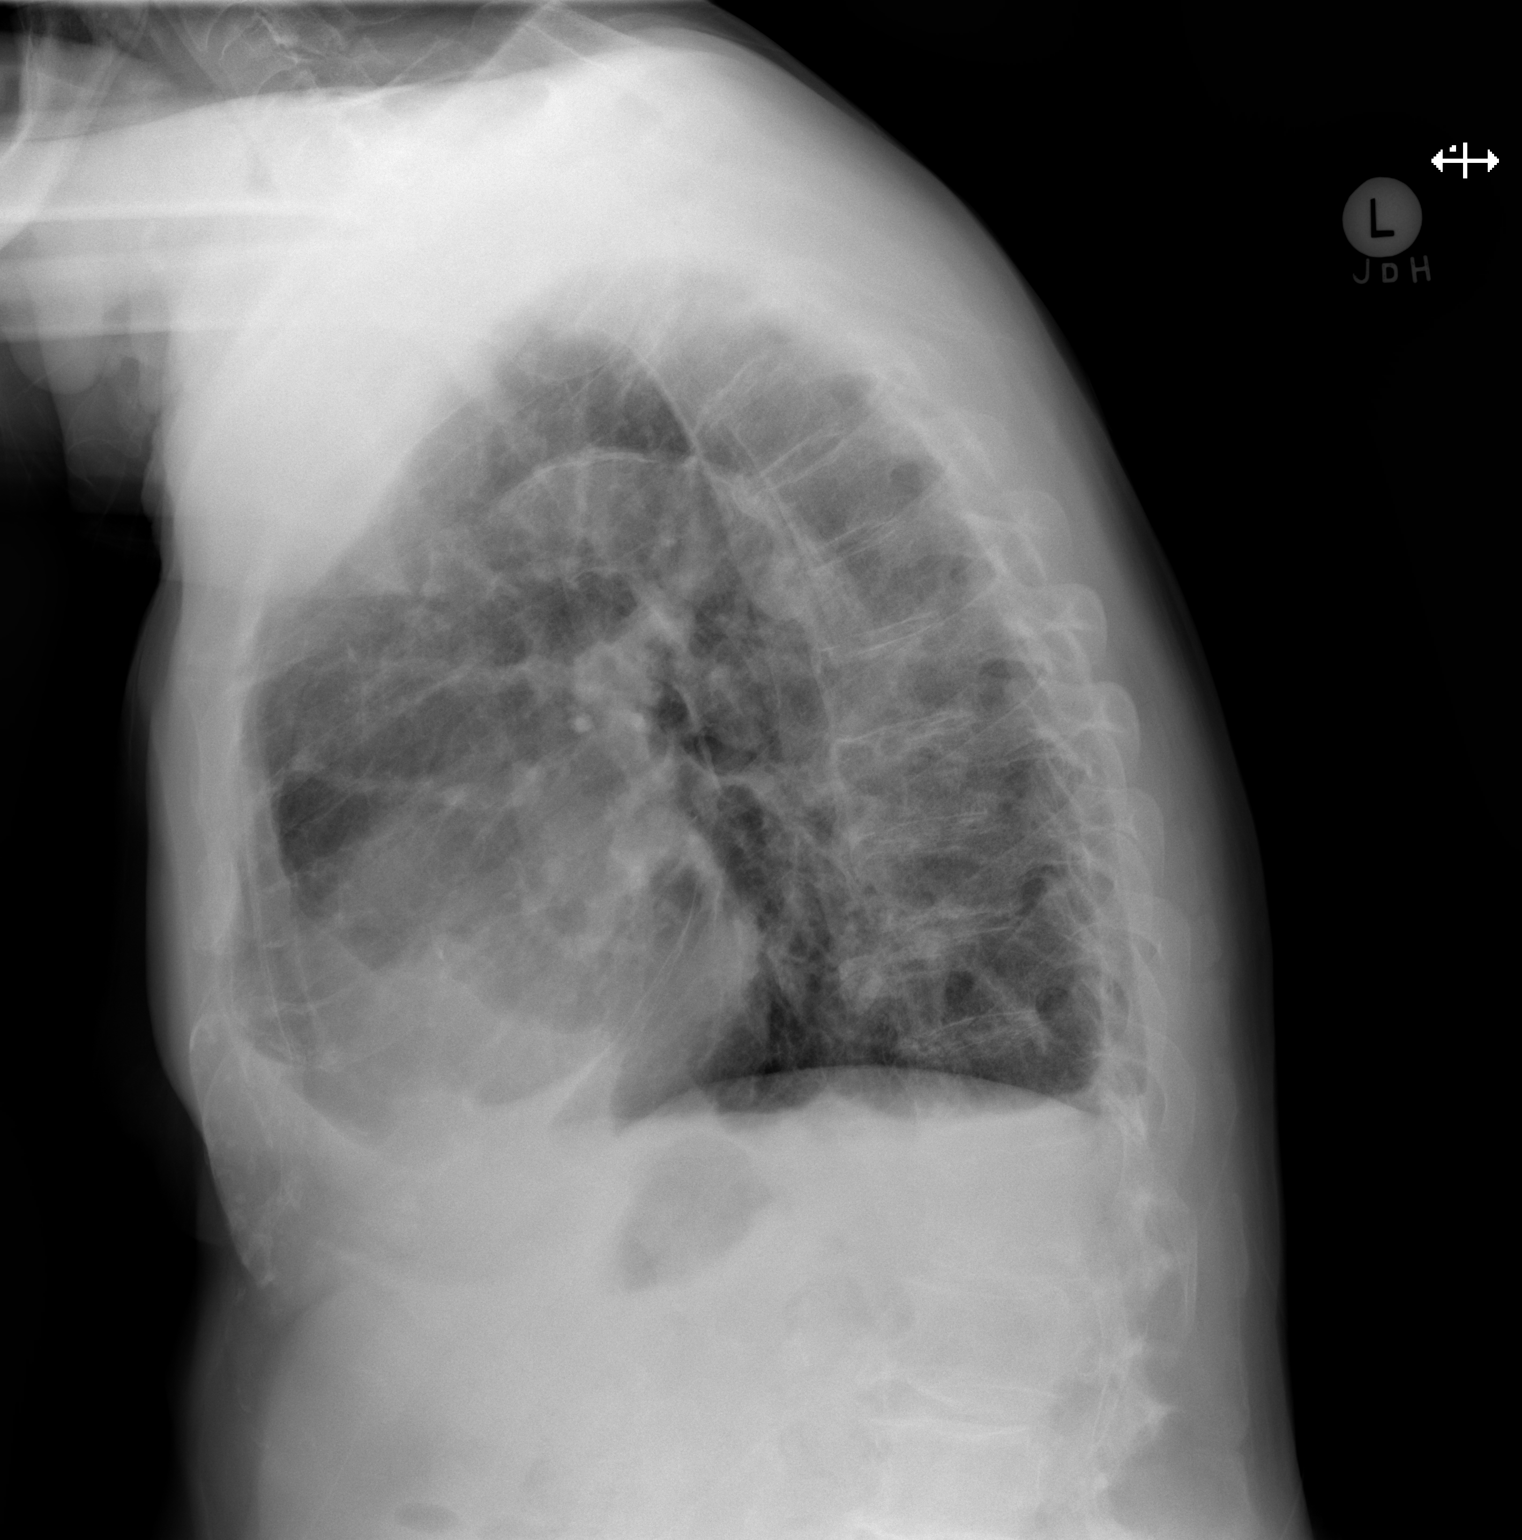

[2 of 2 positions shown; findings below may reference images not displayed]

FINDINGS: Stable cardiomediastinal silhouette with normal heart size. No
pneumothorax. Hyperinflated lungs. Mild blunting of the right
greater than left costophrenic angles, decreased on the right and
stable on the left. No pulmonary edema. Stable mild scarring versus
atelectasis at the right lung base. No acute consolidative airspace
disease. A 9 mm nodular opacity in the upper left lung is stable
since 02/26/2018 and was possibly present on 06/29/2015 chest
radiograph, although appears more discrete on today's radiograph.
IMPRESSION: 1. Stable mild scarring at the right lung base. No acute
consolidative airspace disease to suggest a pneumonia.
2. Trace residual bilateral pleural effusions versus
pleuroparenchymal scarring at the costophrenic angles.
3. Nodular 9 mm opacity in the upper left lung, stable since
02/26/2018 radiograph, possibly present on 06/29/2015 radiograph,
although more discrete on today's radiograph. Chest CT suggested for
further evaluation.
4. Hyperinflated lungs, suggesting obstructive lung disease.

## 2019-06-05 ENCOUNTER — Ambulatory Visit: Payer: Medicare Other | Admitting: Interventional Cardiology

## 2019-06-18 ENCOUNTER — Telehealth: Payer: Self-pay | Admitting: *Deleted

## 2019-06-18 ENCOUNTER — Ambulatory Visit (INDEPENDENT_AMBULATORY_CARE_PROVIDER_SITE_OTHER): Payer: Medicare Other | Admitting: Nurse Practitioner

## 2019-06-18 ENCOUNTER — Encounter: Payer: Self-pay | Admitting: Nurse Practitioner

## 2019-06-18 ENCOUNTER — Other Ambulatory Visit: Payer: Self-pay

## 2019-06-18 DIAGNOSIS — I5032 Chronic diastolic (congestive) heart failure: Secondary | ICD-10-CM

## 2019-06-18 DIAGNOSIS — C439 Malignant melanoma of skin, unspecified: Secondary | ICD-10-CM | POA: Diagnosis not present

## 2019-06-18 DIAGNOSIS — R600 Localized edema: Secondary | ICD-10-CM

## 2019-06-18 DIAGNOSIS — I10 Essential (primary) hypertension: Secondary | ICD-10-CM

## 2019-06-18 MED ORDER — METOPROLOL TARTRATE 25 MG PO TABS: 25.0000 mg | ORAL_TABLET | Freq: Two times a day (BID) | ORAL | 0 refills | Status: AC

## 2019-06-18 NOTE — Telephone Encounter (Signed)
[name] called me and stated that she did not think she was going to get to patient today. Stated that she did not have a place at [facility] to speak with patient and too many meetings. Stated to ask patient if they would like to speak with [facility] instead or [name] would speak to them tomorrow.   I called patient wife and she wants [name]. [name] is PCP.   I sent message to [name] asking what to do because she has no appointments for tomorrow. Awaiting response.

## 2019-06-18 NOTE — Telephone Encounter (Signed)
Patient wife, Inez Catalina called and stated that she was calling with an update on patient. Stated that he is sleeping a lot. Has Hospice coming to home.  Stated that yesterday patient had 500cc urine output but last night to this morning he has only had 10cc. Patient swelling is gone since he has been in bed. Wants to know if it is ok to hold the Lasix. Please Advise.    Also requesting RF on Metoprolol. Faxed to pharmacy.

## 2019-06-18 NOTE — Telephone Encounter (Signed)
Patient wife aware and agreed.

## 2019-06-18 NOTE — Patient Instructions (Signed)
To use lasix 20 mg daily as needed swelling, to monitor for increase in shortness of breath, chest pains, worsening cough or congestion, needing to sleep proped up.

## 2019-06-18 NOTE — Telephone Encounter (Signed)
TeleVisit scheduled for today. Spoke with Faythe Dingwall and she stated that [name] can call patient from [facility]. Added to schedule.

## 2019-06-18 NOTE — Telephone Encounter (Signed)
I was able to call them and do the visit

## 2019-06-18 NOTE — Telephone Encounter (Signed)
Can we set him up for a televisit, have some questions.

## 2019-06-18 NOTE — Progress Notes (Signed)
This service is provided via telemedicine  No vital signs collected/recorded due to the encounter was a telemedicine visit.   Location of patient (ex: home, work):  Home Patient consents to a telephone visit:  Yes  Location of the provider (ex: office, home):  Office  Name of any referring provider:  Sherrie Mustache, NP  Names of all persons participating in the telemedicine service and their role in the encounter:  [name], Patient; Alberteen Sam, wife; Rodena Piety May, CMA; Sherrie Mustache, NP  Time spent on call:  6:12 with CMA     Careteam: Patient Care Team: [name], NP as PCP - General (Geriatric Medicine) Jettie Booze, MD as PCP - Cardiology (Cardiology) [name], MD as Consulting Physician (Nephrology)  Advanced Directive information    Allergies  Allergen Reactions  . Aleve [Naproxen Sodium] Other (See Comments)    Upset stomach   . Antihistamines, Diphenhydramine-Type Other (See Comments)    Causes urinary issues  . Dimenhydrinate Other (See Comments)    Causes urinary issues Causes urinary issues Causes urinary issues Causes urinary issues  . Nsaids     upset stomach  . Tolmetin Other (See Comments)    Bother his stomach Bother his stomach    Chief Complaint  Patient presents with  . Acute Visit    Medication Management. Concerns with taking the Lasix     HPI: Patient is a 85 y.o. male due to concerns. Has been hospice for 6-8 weeks, sleeping a lot more.  He put out 500cc yesterday and only 130 cc today Wife is concerned- unsure if his kidneys are shutting down or what is going on, suspect due to the fact he is sleeping more and not drinking enough.  Cough is worse- no change recently , getting "strangled" while drinking.  No shortness of breath or congestion No swelling in 6 weeks Currently taking lasix 20 mg daily- per nephrologist to use as needed swelling but has given daily.   No pain noted.  htn-135/82  -continues on lopressor, ? If she should cut back.   Had a few painful Lesions from melatonin to arm. This has now improved at this time.   Review of Systems:  Review of Systems  Constitutional: Negative for chills, fever and malaise/fatigue.  Respiratory: Positive for cough. Negative for sputum production, shortness of breath and wheezing.   Cardiovascular: Negative for chest pain, palpitations, orthopnea and leg swelling.  Gastrointestinal: Negative for abdominal pain.  Genitourinary: Negative for dysuria, frequency and urgency.  Musculoskeletal: Negative for joint pain and myalgias.  Skin: Negative for itching and rash.       Multiple skin nodule.   Neurological: Positive for tremors and weakness. Negative for dizziness and headaches.  Psychiatric/Behavioral: Negative for depression. The patient is not nervous/anxious and does not have insomnia.     Past Medical History:  Diagnosis Date  . Abnormality of gait   . Benign neoplasm of colon   . Benign prostate hyperplasia   . Chronic kidney disease, stage IV (severe) ([name])   . Closed fracture of ramus of right pubis ([place])   . Complication of anesthesia    postop headache after general anesthesia  . Degenerative arthritis   . Diverticulosis of colon (without mention of hemorrhage)   . Duodenal ulcer, unspecified as acute or chronic, without hemorrhage, perforation, or obstruction   . Encounter for long-term (current) use of other medications   . Enlarged prostate    takes Tamsulosin daily  .  Essential and other specified forms of tremor   . Foley catheter in place 09-01-13   4 months now  . Foot drop, bilateral   . Gait disorder    balance issues-uses walker or mobile chair.  . Gout    takes Allopurinol daily  . Gout, unspecified   . Hyperlipidemia    but doesn't require meds  . Hypertension    takes Enalapril daily   . Inguinal hernia without mention of obstruction or gangrene, unilateral or unspecified, (not specified as  recurrent)   . Internal hemorrhoids without mention of complication   . Macular degeneration    takes eye cap vits daily  . Other abnormal blood chemistry   . PAD (peripheral artery disease) ([place])   . Peripheral neuropathy   . Peripheral neuropathy   . Pneumonia    hx of;last time 8'14-mild.  . Ptosis    Left side  . Spinal stenosis, lumbar region, with neurogenic claudication   . Syncope   . TIA (transient ischemic attack)    sees Dr.Willis-neurologist 6'14(strange feeling of head)-no residual  . Unspecified vitamin D deficiency   . Urinary frequency   . Urinary urgency   . UTI (lower urinary tract infection) 09-01-13   tx. 2 weeks ago   Past Surgical History:  Procedure Laterality Date  . CATARACT EXTRACTION Right 08/31/2014   Dr.Lyles  . colonosocpy    . FINGER SURGERY Left    Left ring finger  . HERNIA REPAIR     at least 8yrs ago  . HIP PINNING,CANNULATED Left 05/05/2013   Procedure: CANNULATED HIP PINNING- left;  Surgeon: Renette Butters, MD;  Location: [facility];  Service: Orthopedics;  Laterality: Left;  . INGUINAL HERNIA REPAIR  09/05/2012   Procedure: LAPAROSCOPIC INGUINAL HERNIA;  Surgeon: Gayland Curry, MD,FACS;  Location: [facility];  Service: General;  Laterality: Left;  . INSERTION OF MESH  09/05/2012   Procedure: INSERTION OF MESH;  Surgeon: Gayland Curry, MD,FACS;  Location: [facility];  Service: General;  Laterality: Left;  . left leg surgery  2011  . NERVE BIOPSY    . TRANSURETHRAL RESECTION OF PROSTATE N/A 09/07/2013   Procedure: CYSTO TRANSURETHRAL RESECTION OF THE PROSTATE (TURP);  Surgeon: Reece Packer, MD;  Location: [facility];  Service: Urology;  Laterality: N/A;   Social History:   reports that he quit smoking about 34 years ago. His smoking use included cigarettes. He has a 40.00 pack-year smoking history. He quit smokeless tobacco use about 18 years ago.  His smokeless tobacco use included chew. He reports that he does not drink alcohol or use drugs.  Family  History  Problem Relation Age of Onset  . Diabetes Mother        type 2  . Clotting disorder Mother        deceased, blood clot  . Leukemia Father   . COPD Brother   . Lung cancer Sister   . Heart disease Brother        1/2 brother  . COPD Brother     Medications: Patient's Medications  New Prescriptions   No medications on file  Previous Medications   ACETAMINOPHEN (TYLENOL) 500 MG TABLET    Take 500 mg by mouth at bedtime.    ALLOPURINOL (ZYLOPRIM) 100 MG TABLET    Take 2 tablets by mouth once daily   B COMPLEX VITAMINS CAPSULE    Take 1 capsule by mouth daily.    CHOLECALCIFEROL (VITAMIN D3) 2000 UNITS  TABS    Take 2,000 Units by mouth daily.    CLOPIDOGREL (PLAVIX) 75 MG TABLET    TAKE 1 TABLET BY MOUTH ONCE DAILY FOR ANTICOAGULATION.   FINASTERIDE (PROSCAR) 5 MG TABLET    TAKE 1 TABLET BY MOUTH ONCE DAILY TO HELP SHRINK PROSTATE.   FUROSEMIDE (LASIX) 20 MG TABLET    Take 1 tablet (20 mg total) by mouth as needed for edema. Ok to increase to 40 mg if swelling presents per kidney specialist   METOPROLOL TARTRATE (LOPRESSOR) 25 MG TABLET    Take 1 tablet (25 mg total) by mouth 2 (two) times daily.   MULTIPLE VITAMINS-MINERALS (PRESERVISION AREDS 2 PO)    Take 1 tablet by mouth daily.   PRIMIDONE (MYSOLINE) 50 MG TABLET    TAKE 1 TABLET BY MOUTH ONCE DAILY IN THE EVENING TO HELP WITH TREMORS.   TETRAHYDROZOLINE HCL (VISINE OP)    Apply to eye. As needed for dry eye  Modified Medications   No medications on file  Discontinued Medications   No medications on file    Physical Exam:  There were no vitals filed for this visit. There is no height or weight on file to calculate BMI. Wt Readings from Last 3 Encounters:  03/11/19 177 lb (80.3 kg)  03/04/19 168 lb (76.2 kg)  12/11/18 184 lb 12.8 oz (83.8 kg)   Labs reviewed: Basic Metabolic Panel: Recent Labs    10/22/18 1512 12/10/18 0945 03/23/19 1142  NA 137 138 140  K 5.1 4.9 4.5  CL 102 104 105  CO2 24 25 25    GLUCOSE 74 65 80  BUN 51* 61* 63*  CREATININE 2.23* 2.54* 2.46*  CALCIUM 9.0 9.2 8.8   Liver Function Tests: Recent Labs    07/16/18 1115 03/23/19 1142  AST 31 26  ALT 37 28  BILITOT 0.3 0.4  PROT 6.5 6.2   No results for input(s): LIPASE, AMYLASE in the last 8760 hours. No results for input(s): AMMONIA in the last 8760 hours. CBC: Recent Labs    07/16/18 1115 12/10/18 0945 03/23/19 1142  WBC 7.1 5.4 5.7  NEUTROABS 4,303 3,213 3,101  HGB 12.8* 12.9* 12.9*  HCT 38.0* 38.6 38.7  MCV 94.8 96.3 98.7  PLT 166 120* 117*   Lipid Panel: No results for input(s): CHOL, HDL, LDLCALC, TRIG, CHOLHDL, LDLDIRECT in the last 8760 hours. TSH: No results for input(s): TSH in the last 8760 hours. A1C: No results found for: HGBA1C   Assessment/Plan 1. Chronic diastolic heart failure (HCC) Stable at this time. To monitor for signs of fluid overload since stopping routine lasix  2. Melanoma of skin ([place]) Improved tenderness, to notify if pain worsens  3. Essential hypertension -blood pressure stable at this time. Encouraged to continue currentl medications (wife wondering if he should cut back), to notify if blood pressure gets low.   4. LE edema Lasix ordered 20 mg by mouth daily as needed however taking daily and not having any swelling. Okay to stop and monitor at this time.   Drew Gibson. Harle Battiest  [facility] [phone]    Virtual Visit via Telephone Note  I connected with@ on 06/18/19 at  1:00 PM EDT by telephone and verified that I am speaking with the correct person using two identifiers.  Location: Patient: Home Provider: [facility]   I discussed the limitations, risks, security and privacy concerns of performing an evaluation and management service by telephone and the availability of  in person appointments. I also discussed with the patient that there may be a patient responsible charge related to this service. The patient expressed  understanding and agreed to proceed.   I discussed the assessment and treatment plan with the patient. The patient was provided an opportunity to ask questions and all were answered. The patient agreed with the plan and demonstrated an understanding of the instructions.   The patient was advised to call back or seek an in-person evaluation if the symptoms worsen or if the condition fails to improve as anticipated.  I provided 13 minutes of non-face-to-face time during this encounter.  Drew Gibson. Harle Battiest Avs printed and mailed

## 2019-06-24 ENCOUNTER — Ambulatory Visit: Payer: Medicare Other | Admitting: Neurology

## 2019-07-20 ENCOUNTER — Ambulatory Visit: Payer: Self-pay

## 2019-07-20 ENCOUNTER — Encounter: Payer: Self-pay | Admitting: Nurse Practitioner

## 2019-11-02 DEATH — deceased
